# Patient Record
Sex: Male | Born: 1950 | Race: White | Hispanic: No | Marital: Single | Smoking: Current every day smoker
Health system: Southern US, Community
[De-identification: ages and names within clinical notes are randomized; demographics above are authoritative.]

## PROBLEM LIST (undated history)

## (undated) ENCOUNTER — Emergency Department (HOSPITAL_COMMUNITY): Admission: EM | Payer: Self-pay

## (undated) DIAGNOSIS — N200 Calculus of kidney: Secondary | ICD-10-CM

## (undated) DIAGNOSIS — K0510 Chronic gingivitis, plaque induced: Secondary | ICD-10-CM

## (undated) DIAGNOSIS — I4891 Unspecified atrial fibrillation: Secondary | ICD-10-CM

## (undated) DIAGNOSIS — Z7902 Long term (current) use of antithrombotics/antiplatelets: Secondary | ICD-10-CM

## (undated) DIAGNOSIS — I5022 Chronic systolic (congestive) heart failure: Secondary | ICD-10-CM

## (undated) DIAGNOSIS — Z8709 Personal history of other diseases of the respiratory system: Secondary | ICD-10-CM

## (undated) DIAGNOSIS — E119 Type 2 diabetes mellitus without complications: Secondary | ICD-10-CM

## (undated) DIAGNOSIS — I700 Atherosclerosis of aorta: Secondary | ICD-10-CM

## (undated) DIAGNOSIS — I6523 Occlusion and stenosis of bilateral carotid arteries: Secondary | ICD-10-CM

## (undated) DIAGNOSIS — F1011 Alcohol abuse, in remission: Secondary | ICD-10-CM

## (undated) DIAGNOSIS — F329 Major depressive disorder, single episode, unspecified: Secondary | ICD-10-CM

## (undated) DIAGNOSIS — C4491 Basal cell carcinoma of skin, unspecified: Secondary | ICD-10-CM

## (undated) DIAGNOSIS — K649 Unspecified hemorrhoids: Secondary | ICD-10-CM

## (undated) DIAGNOSIS — I10 Essential (primary) hypertension: Secondary | ICD-10-CM

## (undated) DIAGNOSIS — R0609 Other forms of dyspnea: Secondary | ICD-10-CM

## (undated) DIAGNOSIS — M109 Gout, unspecified: Secondary | ICD-10-CM

## (undated) DIAGNOSIS — M6281 Muscle weakness (generalized): Secondary | ICD-10-CM

## (undated) DIAGNOSIS — E559 Vitamin D deficiency, unspecified: Secondary | ICD-10-CM

## (undated) DIAGNOSIS — K567 Ileus, unspecified: Secondary | ICD-10-CM

## (undated) DIAGNOSIS — K5900 Constipation, unspecified: Secondary | ICD-10-CM

## (undated) DIAGNOSIS — L210 Seborrhea capitis: Secondary | ICD-10-CM

## (undated) DIAGNOSIS — I739 Peripheral vascular disease, unspecified: Secondary | ICD-10-CM

## (undated) DIAGNOSIS — F1490 Cocaine use, unspecified, uncomplicated: Secondary | ICD-10-CM

## (undated) DIAGNOSIS — G8190 Hemiplegia, unspecified affecting unspecified side: Secondary | ICD-10-CM

## (undated) DIAGNOSIS — I2541 Coronary artery aneurysm: Secondary | ICD-10-CM

## (undated) DIAGNOSIS — K30 Functional dyspepsia: Secondary | ICD-10-CM

## (undated) DIAGNOSIS — E785 Hyperlipidemia, unspecified: Secondary | ICD-10-CM

## (undated) DIAGNOSIS — I2510 Atherosclerotic heart disease of native coronary artery without angina pectoris: Secondary | ICD-10-CM

---

## 2020-04-05 ENCOUNTER — Inpatient Hospital Stay (HOSPITAL_COMMUNITY): Payer: 59

## 2020-04-05 ENCOUNTER — Encounter (HOSPITAL_COMMUNITY)
Admission: EM | Disposition: A | Discharge status: Skilled Nursing Facility | Payer: Self-pay | Source: Home / Self Care | Attending: Neurology

## 2020-04-05 ENCOUNTER — Emergency Department (HOSPITAL_COMMUNITY): Payer: 59

## 2020-04-05 ENCOUNTER — Encounter (HOSPITAL_COMMUNITY): Payer: Self-pay | Admitting: *Deleted

## 2020-04-05 ENCOUNTER — Emergency Department (HOSPITAL_COMMUNITY): Payer: 59 | Admitting: Anesthesiology

## 2020-04-05 ENCOUNTER — Other Ambulatory Visit: Payer: Self-pay

## 2020-04-05 ENCOUNTER — Inpatient Hospital Stay (HOSPITAL_COMMUNITY)
Admission: EM | Admit: 2020-04-05 | Discharge: 2020-04-19 | DRG: 023 | Disposition: A | Discharge status: Skilled Nursing Facility | Payer: 59 | Attending: Neurology | Admitting: Neurology

## 2020-04-05 DIAGNOSIS — F17200 Nicotine dependence, unspecified, uncomplicated: Secondary | ICD-10-CM | POA: Diagnosis present

## 2020-04-05 DIAGNOSIS — Z7289 Other problems related to lifestyle: Secondary | ICD-10-CM | POA: Diagnosis not present

## 2020-04-05 DIAGNOSIS — H53462 Homonymous bilateral field defects, left side: Secondary | ICD-10-CM | POA: Diagnosis present

## 2020-04-05 DIAGNOSIS — W19XXXA Unspecified fall, initial encounter: Secondary | ICD-10-CM | POA: Diagnosis present

## 2020-04-05 DIAGNOSIS — I69391 Dysphagia following cerebral infarction: Secondary | ICD-10-CM | POA: Diagnosis not present

## 2020-04-05 DIAGNOSIS — T7840XA Allergy, unspecified, initial encounter: Secondary | ICD-10-CM | POA: Diagnosis present

## 2020-04-05 DIAGNOSIS — R29719 NIHSS score 19: Secondary | ICD-10-CM | POA: Diagnosis present

## 2020-04-05 DIAGNOSIS — E669 Obesity, unspecified: Secondary | ICD-10-CM | POA: Diagnosis present

## 2020-04-05 DIAGNOSIS — R1312 Dysphagia, oropharyngeal phase: Secondary | ICD-10-CM | POA: Diagnosis present

## 2020-04-05 DIAGNOSIS — R4781 Slurred speech: Secondary | ICD-10-CM | POA: Diagnosis present

## 2020-04-05 DIAGNOSIS — F1410 Cocaine abuse, uncomplicated: Secondary | ICD-10-CM | POA: Diagnosis present

## 2020-04-05 DIAGNOSIS — J9811 Atelectasis: Secondary | ICD-10-CM | POA: Diagnosis not present

## 2020-04-05 DIAGNOSIS — Z20822 Contact with and (suspected) exposure to covid-19: Secondary | ICD-10-CM | POA: Diagnosis present

## 2020-04-05 DIAGNOSIS — G936 Cerebral edema: Secondary | ICD-10-CM | POA: Diagnosis not present

## 2020-04-05 DIAGNOSIS — F10239 Alcohol dependence with withdrawal, unspecified: Secondary | ICD-10-CM | POA: Diagnosis not present

## 2020-04-05 DIAGNOSIS — Z716 Tobacco abuse counseling: Secondary | ICD-10-CM

## 2020-04-05 DIAGNOSIS — F1010 Alcohol abuse, uncomplicated: Secondary | ICD-10-CM | POA: Diagnosis not present

## 2020-04-05 DIAGNOSIS — I161 Hypertensive emergency: Secondary | ICD-10-CM | POA: Diagnosis present

## 2020-04-05 DIAGNOSIS — E782 Mixed hyperlipidemia: Secondary | ICD-10-CM | POA: Diagnosis not present

## 2020-04-05 DIAGNOSIS — F17210 Nicotine dependence, cigarettes, uncomplicated: Secondary | ICD-10-CM | POA: Diagnosis present

## 2020-04-05 DIAGNOSIS — I639 Cerebral infarction, unspecified: Secondary | ICD-10-CM

## 2020-04-05 DIAGNOSIS — G8194 Hemiplegia, unspecified affecting left nondominant side: Secondary | ICD-10-CM | POA: Diagnosis present

## 2020-04-05 DIAGNOSIS — I63511 Cerebral infarction due to unspecified occlusion or stenosis of right middle cerebral artery: Secondary | ICD-10-CM | POA: Diagnosis present

## 2020-04-05 DIAGNOSIS — E1165 Type 2 diabetes mellitus with hyperglycemia: Secondary | ICD-10-CM | POA: Diagnosis present

## 2020-04-05 DIAGNOSIS — G4733 Obstructive sleep apnea (adult) (pediatric): Secondary | ICD-10-CM | POA: Diagnosis present

## 2020-04-05 DIAGNOSIS — Z6832 Body mass index (BMI) 32.0-32.9, adult: Secondary | ICD-10-CM

## 2020-04-05 DIAGNOSIS — F1020 Alcohol dependence, uncomplicated: Secondary | ICD-10-CM | POA: Diagnosis not present

## 2020-04-05 DIAGNOSIS — R471 Dysarthria and anarthria: Secondary | ICD-10-CM | POA: Diagnosis present

## 2020-04-05 DIAGNOSIS — E876 Hypokalemia: Secondary | ICD-10-CM | POA: Diagnosis present

## 2020-04-05 DIAGNOSIS — I10 Essential (primary) hypertension: Secondary | ICD-10-CM | POA: Diagnosis present

## 2020-04-05 DIAGNOSIS — E785 Hyperlipidemia, unspecified: Secondary | ICD-10-CM | POA: Diagnosis present

## 2020-04-05 DIAGNOSIS — R414 Neurologic neglect syndrome: Secondary | ICD-10-CM | POA: Diagnosis present

## 2020-04-05 DIAGNOSIS — E7800 Pure hypercholesterolemia, unspecified: Secondary | ICD-10-CM | POA: Diagnosis not present

## 2020-04-05 DIAGNOSIS — R21 Rash and other nonspecific skin eruption: Secondary | ICD-10-CM | POA: Diagnosis not present

## 2020-04-05 DIAGNOSIS — IMO0002 Reserved for concepts with insufficient information to code with codable children: Secondary | ICD-10-CM | POA: Diagnosis present

## 2020-04-05 DIAGNOSIS — Z713 Dietary counseling and surveillance: Secondary | ICD-10-CM

## 2020-04-05 DIAGNOSIS — I63512 Cerebral infarction due to unspecified occlusion or stenosis of left middle cerebral artery: Secondary | ICD-10-CM | POA: Diagnosis not present

## 2020-04-05 DIAGNOSIS — R1310 Dysphagia, unspecified: Secondary | ICD-10-CM

## 2020-04-05 DIAGNOSIS — R29810 Facial weakness: Secondary | ICD-10-CM | POA: Diagnosis present

## 2020-04-05 DIAGNOSIS — Z8249 Family history of ischemic heart disease and other diseases of the circulatory system: Secondary | ICD-10-CM

## 2020-04-05 DIAGNOSIS — Z4659 Encounter for fitting and adjustment of other gastrointestinal appliance and device: Secondary | ICD-10-CM

## 2020-04-05 DIAGNOSIS — N179 Acute kidney failure, unspecified: Secondary | ICD-10-CM | POA: Diagnosis present

## 2020-04-05 DIAGNOSIS — E119 Type 2 diabetes mellitus without complications: Secondary | ICD-10-CM | POA: Diagnosis present

## 2020-04-05 HISTORY — DX: Cerebral infarction due to unspecified occlusion or stenosis of right middle cerebral artery: I63.511

## 2020-04-05 HISTORY — PX: IR PERCUTANEOUS ART THROMBECTOMY/INFUSION INTRACRANIAL INC DIAG ANGIO: IMG6087

## 2020-04-05 HISTORY — PX: IR INTRA CRAN STENT: IMG2345

## 2020-04-05 HISTORY — DX: Hemiplegia, unspecified affecting left nondominant side: G81.94

## 2020-04-05 HISTORY — PX: IR CT HEAD LTD: IMG2386

## 2020-04-05 HISTORY — PX: RADIOLOGY WITH ANESTHESIA: SHX6223

## 2020-04-05 LAB — MRSA PCR SCREENING: MRSA by PCR: NEGATIVE

## 2020-04-05 LAB — DIFFERENTIAL
Abs Immature Granulocytes: 0.08 10*3/uL — ABNORMAL HIGH (ref 0.00–0.07)
Basophils Absolute: 0.1 10*3/uL (ref 0.0–0.1)
Basophils Relative: 1 %
Eosinophils Absolute: 0.3 10*3/uL (ref 0.0–0.5)
Eosinophils Relative: 3 %
Immature Granulocytes: 1 %
Lymphocytes Relative: 31 %
Lymphs Abs: 2.8 10*3/uL (ref 0.7–4.0)
Monocytes Absolute: 0.6 10*3/uL (ref 0.1–1.0)
Monocytes Relative: 7 %
Neutro Abs: 5.2 10*3/uL (ref 1.7–7.7)
Neutrophils Relative %: 57 %

## 2020-04-05 LAB — SARS CORONAVIRUS 2 BY RT PCR (HOSPITAL ORDER, PERFORMED IN ~~LOC~~ HOSPITAL LAB): SARS Coronavirus 2: NEGATIVE

## 2020-04-05 LAB — CBC
HCT: 46.7 % (ref 39.0–52.0)
Hemoglobin: 16 g/dL (ref 13.0–17.0)
MCH: 29 pg (ref 26.0–34.0)
MCHC: 34.3 g/dL (ref 30.0–36.0)
MCV: 84.8 fL (ref 80.0–100.0)
Platelets: 205 10*3/uL (ref 150–400)
RBC: 5.51 MIL/uL (ref 4.22–5.81)
RDW: 13.4 % (ref 11.5–15.5)
WBC: 9 10*3/uL (ref 4.0–10.5)
nRBC: 0 % (ref 0.0–0.2)

## 2020-04-05 LAB — I-STAT CHEM 8, ED
BUN: 15 mg/dL (ref 8–23)
Calcium, Ion: 1.18 mmol/L (ref 1.15–1.40)
Chloride: 99 mmol/L (ref 98–111)
Creatinine, Ser: 1.6 mg/dL — ABNORMAL HIGH (ref 0.61–1.24)
Glucose, Bld: 356 mg/dL — ABNORMAL HIGH (ref 70–99)
HCT: 48 % (ref 39.0–52.0)
Hemoglobin: 16.3 g/dL (ref 13.0–17.0)
Potassium: 3.3 mmol/L — ABNORMAL LOW (ref 3.5–5.1)
Sodium: 137 mmol/L (ref 135–145)
TCO2: 22 mmol/L (ref 22–32)

## 2020-04-05 LAB — URINALYSIS, ROUTINE W REFLEX MICROSCOPIC
Bacteria, UA: NONE SEEN
Bilirubin Urine: NEGATIVE
Glucose, UA: >=500 mg/dL — AB
Ketones, ur: NEGATIVE mg/dL
Leukocytes,Ua: NEGATIVE
Nitrite: NEGATIVE
Protein, ur: 100 mg/dL — AB
Specific Gravity, Urine: 1.041 — ABNORMAL HIGH (ref 1.005–1.030)
pH: 5 (ref 5.0–8.0)

## 2020-04-05 LAB — COMPREHENSIVE METABOLIC PANEL
ALT: 38 U/L (ref 0–44)
AST: 39 U/L (ref 15–41)
Albumin: 3.6 g/dL (ref 3.5–5.0)
Alkaline Phosphatase: 80 U/L (ref 38–126)
Anion gap: 16 — ABNORMAL HIGH (ref 5–15)
BUN: 13 mg/dL (ref 8–23)
CO2: 21 mmol/L — ABNORMAL LOW (ref 22–32)
Calcium: 9.5 mg/dL (ref 8.9–10.3)
Chloride: 99 mmol/L (ref 98–111)
Creatinine, Ser: 1.69 mg/dL — ABNORMAL HIGH (ref 0.61–1.24)
GFR calc Af Amer: 48 mL/min — ABNORMAL LOW (ref >60)
GFR calc non Af Amer: 41 mL/min — ABNORMAL LOW (ref >60)
Glucose, Bld: 351 mg/dL — ABNORMAL HIGH (ref 70–99)
Potassium: 3.4 mmol/L — ABNORMAL LOW (ref 3.5–5.1)
Sodium: 136 mmol/L (ref 135–145)
Total Bilirubin: 0.8 mg/dL (ref 0.3–1.2)
Total Protein: 7 g/dL (ref 6.5–8.1)

## 2020-04-05 LAB — GLUCOSE, CAPILLARY
Glucose-Capillary: 323 mg/dL — ABNORMAL HIGH (ref 70–99)
Glucose-Capillary: 285 mg/dL — ABNORMAL HIGH (ref 70–99)
Glucose-Capillary: 233 mg/dL — ABNORMAL HIGH (ref 70–99)

## 2020-04-05 LAB — CBG MONITORING, ED: Glucose-Capillary: 309 mg/dL — ABNORMAL HIGH (ref 70–99)

## 2020-04-05 LAB — RAPID URINE DRUG SCREEN, HOSP PERFORMED
Amphetamines: NOT DETECTED
Barbiturates: NOT DETECTED
Benzodiazepines: NOT DETECTED
Cocaine: POSITIVE — AB
Opiates: NOT DETECTED
Tetrahydrocannabinol: NOT DETECTED

## 2020-04-05 LAB — PROTIME-INR
INR: 1 (ref 0.8–1.2)
Prothrombin Time: 13 seconds (ref 11.4–15.2)

## 2020-04-05 LAB — APTT: aPTT: 29 seconds (ref 24–36)

## 2020-04-05 LAB — ETHANOL: Alcohol, Ethyl (B): <10 mg/dL (ref <10)

## 2020-04-05 SURGERY — IR WITH ANESTHESIA
Anesthesia: General

## 2020-04-05 MED ORDER — IOHEXOL 350 MG/ML SOLN
50.0000 mL | Freq: Once | INTRAVENOUS | Status: AC | PRN
  Administered 2020-04-05: 50 mL via INTRAVENOUS

## 2020-04-05 MED ORDER — VERAPAMIL HCL 2.5 MG/ML IV SOLN
INTRAVENOUS | Status: AC
  Filled 2020-04-05: qty 2

## 2020-04-05 MED ORDER — CANGRELOR BOLUS VIA INFUSION
INTRAVENOUS | Status: DC | PRN
  Administered 2020-04-05: 1491 ug via INTRAVENOUS

## 2020-04-05 MED ORDER — SUGAMMADEX SODIUM 200 MG/2ML IV SOLN
INTRAVENOUS | Status: DC | PRN
  Administered 2020-04-05 (×2): 200 mg via INTRAVENOUS

## 2020-04-05 MED ORDER — CLEVIDIPINE BUTYRATE 0.5 MG/ML IV EMUL
INTRAVENOUS | Status: AC
  Filled 2020-04-05: qty 50

## 2020-04-05 MED ORDER — ACETAMINOPHEN 650 MG RE SUPP: 650.0000 mg | RECTAL | Status: DC | PRN

## 2020-04-05 MED ORDER — SODIUM CHLORIDE 0.9 % IV SOLN: INTRAVENOUS | Status: DC

## 2020-04-05 MED ORDER — LABETALOL HCL 5 MG/ML IV SOLN
20.0000 mg | Freq: Once | INTRAVENOUS | Status: AC
  Administered 2020-04-05: 20 mg via INTRAVENOUS

## 2020-04-05 MED ORDER — VERAPAMIL HCL 2.5 MG/ML IV SOLN
INTRAVENOUS | Status: DC | PRN
  Administered 2020-04-05: 10 mg via INTRA_ARTERIAL

## 2020-04-05 MED ORDER — CLOPIDOGREL BISULFATE 300 MG PO TABS
ORAL_TABLET | ORAL | Status: AC
  Filled 2020-04-05: qty 1

## 2020-04-05 MED ORDER — PANTOPRAZOLE SODIUM 40 MG IV SOLR
40.0000 mg | Freq: Every day | INTRAVENOUS | Status: DC
  Administered 2020-04-05: 40 mg via INTRAVENOUS
  Filled 2020-04-05: qty 40

## 2020-04-05 MED ORDER — ORAL CARE MOUTH RINSE
15.0000 mL | Freq: Two times a day (BID) | OROMUCOSAL | Status: DC
  Administered 2020-04-06 – 2020-04-19 (×25): 15 mL via OROMUCOSAL

## 2020-04-05 MED ORDER — CHLORHEXIDINE GLUCONATE CLOTH 2 % EX PADS
6.0000 | MEDICATED_PAD | Freq: Every day | CUTANEOUS | Status: DC
  Administered 2020-04-07 – 2020-04-13 (×6): 6 via TOPICAL

## 2020-04-05 MED ORDER — PROPOFOL 10 MG/ML IV BOLUS
INTRAVENOUS | Status: DC | PRN
  Administered 2020-04-05: 140 mg via INTRAVENOUS

## 2020-04-05 MED ORDER — CHLORHEXIDINE GLUCONATE 0.12 % MT SOLN
15.0000 mL | Freq: Two times a day (BID) | OROMUCOSAL | Status: DC
  Administered 2020-04-05 – 2020-04-19 (×27): 15 mL via OROMUCOSAL
  Filled 2020-04-05 (×23): qty 15

## 2020-04-05 MED ORDER — FENTANYL CITRATE (PF) 100 MCG/2ML IJ SOLN
INTRAMUSCULAR | Status: AC
  Filled 2020-04-05: qty 2

## 2020-04-05 MED ORDER — ASPIRIN 81 MG PO CHEW
CHEWABLE_TABLET | ORAL | Status: AC
  Filled 2020-04-05: qty 1

## 2020-04-05 MED ORDER — SODIUM CHLORIDE 0.9 % IV SOLN: 50.0000 mL | Freq: Once | INTRAVENOUS | Status: AC

## 2020-04-05 MED ORDER — ONDANSETRON HCL 4 MG/2ML IJ SOLN
INTRAMUSCULAR | Status: DC | PRN
  Administered 2020-04-05: 4 mg via INTRAVENOUS

## 2020-04-05 MED ORDER — ACETAMINOPHEN 325 MG PO TABS
650.0000 mg | ORAL_TABLET | ORAL | Status: DC | PRN
  Administered 2020-04-14 – 2020-04-18 (×5): 650 mg via ORAL
  Filled 2020-04-05 (×5): qty 2

## 2020-04-05 MED ORDER — IOHEXOL 240 MG/ML SOLN
INTRAMUSCULAR | Status: AC
  Filled 2020-04-05: qty 200

## 2020-04-05 MED ORDER — TICAGRELOR 90 MG PO TABS
ORAL_TABLET | ORAL | Status: AC
  Filled 2020-04-05: qty 2

## 2020-04-05 MED ORDER — SODIUM CHLORIDE 0.9 % IV SOLN
INTRAVENOUS | Status: DC | PRN
  Administered 2020-04-05: 2 ug/kg/min via INTRAVENOUS

## 2020-04-05 MED ORDER — PHENYLEPHRINE 40 MCG/ML (10ML) SYRINGE FOR IV PUSH (FOR BLOOD PRESSURE SUPPORT)
PREFILLED_SYRINGE | INTRAVENOUS | Status: DC | PRN
  Administered 2020-04-05: 40 ug via INTRAVENOUS
  Administered 2020-04-05: 80 ug via INTRAVENOUS

## 2020-04-05 MED ORDER — FENTANYL CITRATE (PF) 100 MCG/2ML IJ SOLN
INTRAMUSCULAR | Status: DC | PRN
  Administered 2020-04-05 (×2): 50 ug via INTRAVENOUS

## 2020-04-05 MED ORDER — TICAGRELOR 90 MG PO TABS
180.0000 mg | ORAL_TABLET | Freq: Once | ORAL | Status: DC
  Filled 2020-04-05: qty 2

## 2020-04-05 MED ORDER — LIDOCAINE 2% (20 MG/ML) 5 ML SYRINGE
INTRAMUSCULAR | Status: DC | PRN
  Administered 2020-04-05: 50 mg via INTRAVENOUS

## 2020-04-05 MED ORDER — IOHEXOL 300 MG/ML  SOLN
50.0000 mL | Freq: Once | INTRAMUSCULAR | Status: AC | PRN
  Administered 2020-04-05: 30 mL

## 2020-04-05 MED ORDER — ASPIRIN 81 MG PO CHEW
81.0000 mg | CHEWABLE_TABLET | Freq: Once | ORAL | Status: DC
  Filled 2020-04-05: qty 1

## 2020-04-05 MED ORDER — SENNOSIDES-DOCUSATE SODIUM 8.6-50 MG PO TABS: 1.0000 | ORAL_TABLET | Freq: Every evening | ORAL | Status: DC | PRN

## 2020-04-05 MED ORDER — INSULIN ASPART 100 UNIT/ML ~~LOC~~ SOLN
0.0000 [IU] | SUBCUTANEOUS | Status: DC
  Administered 2020-04-05: 5 [IU] via SUBCUTANEOUS
  Administered 2020-04-05: 8 [IU] via SUBCUTANEOUS
  Administered 2020-04-06: 5 [IU] via SUBCUTANEOUS
  Administered 2020-04-06: 2 [IU] via SUBCUTANEOUS
  Administered 2020-04-06 (×2): 3 [IU] via SUBCUTANEOUS
  Administered 2020-04-06: 5 [IU] via SUBCUTANEOUS
  Administered 2020-04-06: 2 [IU] via SUBCUTANEOUS
  Administered 2020-04-07: 3 [IU] via SUBCUTANEOUS
  Administered 2020-04-07 (×2): 2 [IU] via SUBCUTANEOUS
  Administered 2020-04-07: 3 [IU] via SUBCUTANEOUS
  Administered 2020-04-07: 2 [IU] via SUBCUTANEOUS
  Administered 2020-04-07: 3 [IU] via SUBCUTANEOUS
  Administered 2020-04-08 (×4): 5 [IU] via SUBCUTANEOUS
  Administered 2020-04-08: 8 [IU] via SUBCUTANEOUS
  Administered 2020-04-09: 3 [IU] via SUBCUTANEOUS
  Administered 2020-04-09: 8 [IU] via SUBCUTANEOUS
  Administered 2020-04-09: 2 [IU] via SUBCUTANEOUS
  Administered 2020-04-09: 3 [IU] via SUBCUTANEOUS
  Administered 2020-04-09: 5 [IU] via SUBCUTANEOUS
  Administered 2020-04-09 – 2020-04-10 (×2): 3 [IU] via SUBCUTANEOUS
  Administered 2020-04-10 – 2020-04-11 (×8): 5 [IU] via SUBCUTANEOUS
  Administered 2020-04-11: 3 [IU] via SUBCUTANEOUS
  Administered 2020-04-11: 5 [IU] via SUBCUTANEOUS
  Administered 2020-04-11: 3 [IU] via SUBCUTANEOUS
  Administered 2020-04-12 (×3): 5 [IU] via SUBCUTANEOUS
  Administered 2020-04-12 (×2): 3 [IU] via SUBCUTANEOUS
  Administered 2020-04-12 – 2020-04-13 (×3): 5 [IU] via SUBCUTANEOUS
  Administered 2020-04-13 (×4): 3 [IU] via SUBCUTANEOUS
  Administered 2020-04-13: 8 [IU] via SUBCUTANEOUS
  Administered 2020-04-14: 2 [IU] via SUBCUTANEOUS
  Administered 2020-04-14 (×2): 5 [IU] via SUBCUTANEOUS
  Administered 2020-04-14: 3 [IU] via SUBCUTANEOUS
  Administered 2020-04-14: 2 [IU] via SUBCUTANEOUS
  Administered 2020-04-15: 5 [IU] via SUBCUTANEOUS
  Administered 2020-04-15: 2 [IU] via SUBCUTANEOUS
  Administered 2020-04-15 (×3): 3 [IU] via SUBCUTANEOUS
  Administered 2020-04-15: 2 [IU] via SUBCUTANEOUS
  Administered 2020-04-16: 3 [IU] via SUBCUTANEOUS
  Administered 2020-04-16: 2 [IU] via SUBCUTANEOUS
  Administered 2020-04-16: 5 [IU] via SUBCUTANEOUS
  Administered 2020-04-16: 2 [IU] via SUBCUTANEOUS
  Administered 2020-04-17: 3 [IU] via SUBCUTANEOUS
  Administered 2020-04-17: 2 [IU] via SUBCUTANEOUS
  Administered 2020-04-17: 5 [IU] via SUBCUTANEOUS
  Administered 2020-04-17 – 2020-04-18 (×3): 3 [IU] via SUBCUTANEOUS

## 2020-04-05 MED ORDER — ROCURONIUM BROMIDE 10 MG/ML (PF) SYRINGE
PREFILLED_SYRINGE | INTRAVENOUS | Status: DC | PRN
  Administered 2020-04-05: 20 mg via INTRAVENOUS
  Administered 2020-04-05: 30 mg via INTRAVENOUS
  Administered 2020-04-05: 50 mg via INTRAVENOUS
  Administered 2020-04-05 (×2): 20 mg via INTRAVENOUS
  Administered 2020-04-05: 30 mg via INTRAVENOUS

## 2020-04-05 MED ORDER — EPTIFIBATIDE 20 MG/10ML IV SOLN
INTRAVENOUS | Status: AC
  Filled 2020-04-05: qty 10

## 2020-04-05 MED ORDER — LABETALOL HCL 5 MG/ML IV SOLN
INTRAVENOUS | Status: AC
  Filled 2020-04-05: qty 4

## 2020-04-05 MED ORDER — CLEVIDIPINE BUTYRATE 0.5 MG/ML IV EMUL
0.0000 mg/h | INTRAVENOUS | Status: DC
  Administered 2020-04-05: 15 mg/h via INTRAVENOUS
  Administered 2020-04-05: 14 mg/h via INTRAVENOUS
  Administered 2020-04-05: 17 mg/h via INTRAVENOUS
  Administered 2020-04-06 (×2): 30 mg/h via INTRAVENOUS
  Administered 2020-04-06: 21 mg/h via INTRAVENOUS
  Administered 2020-04-06: 27 mg/h via INTRAVENOUS
  Administered 2020-04-06: 28 mg/h via INTRAVENOUS
  Administered 2020-04-06: 30 mg/h via INTRAVENOUS
  Administered 2020-04-06: 20 mg/h via INTRAVENOUS
  Administered 2020-04-06: 10 mg/h via INTRAVENOUS
  Filled 2020-04-05 (×11): qty 50

## 2020-04-05 MED ORDER — CANGRELOR TETRASODIUM 50 MG IV SOLR
INTRAVENOUS | Status: AC
  Filled 2020-04-05: qty 50

## 2020-04-05 MED ORDER — ALTEPLASE (STROKE) FULL DOSE INFUSION
0.9000 mg/kg | Freq: Once | INTRAVENOUS | Status: AC
  Administered 2020-04-05: 89.5 mg via INTRAVENOUS
  Filled 2020-04-05: qty 100

## 2020-04-05 MED ORDER — TICAGRELOR 90 MG PO TABS
180.0000 mg | ORAL_TABLET | Freq: Once | ORAL | Status: AC
  Administered 2020-04-05: 180 mg

## 2020-04-05 MED ORDER — ASPIRIN 81 MG PO CHEW
81.0000 mg | CHEWABLE_TABLET | Freq: Once | ORAL | Status: AC
  Administered 2020-04-05: 81 mg

## 2020-04-05 MED ORDER — NITROGLYCERIN 1 MG/10 ML FOR IR/CATH LAB
INTRA_ARTERIAL | Status: AC
  Filled 2020-04-05: qty 10

## 2020-04-05 MED ORDER — INSULIN ASPART 100 UNIT/ML ~~LOC~~ SOLN
0.0000 [IU] | Freq: Three times a day (TID) | SUBCUTANEOUS | Status: DC
  Administered 2020-04-05: 11 [IU] via SUBCUTANEOUS

## 2020-04-05 MED ORDER — TIROFIBAN HCL IN NACL 5-0.9 MG/100ML-% IV SOLN
INTRAVENOUS | Status: AC
  Filled 2020-04-05: qty 100

## 2020-04-05 MED ORDER — ONDANSETRON HCL 4 MG/2ML IJ SOLN: 4.0000 mg | Freq: Four times a day (QID) | INTRAMUSCULAR | Status: DC | PRN

## 2020-04-05 MED ORDER — IOHEXOL 240 MG/ML SOLN
150.0000 mL | Freq: Once | INTRAMUSCULAR | Status: AC | PRN
  Administered 2020-04-05: 60 mL via INTRA_ARTERIAL

## 2020-04-05 MED ORDER — CLEVIDIPINE BUTYRATE 0.5 MG/ML IV EMUL
0.0000 mg/h | INTRAVENOUS | Status: DC
  Administered 2020-04-05: 2 mg/h via INTRAVENOUS
  Administered 2020-04-05: 21 mg/h via INTRAVENOUS
  Filled 2020-04-05: qty 50

## 2020-04-05 MED ORDER — STROKE: EARLY STAGES OF RECOVERY BOOK
Freq: Once | Status: DC
  Filled 2020-04-05: qty 1

## 2020-04-05 MED ORDER — ACETAMINOPHEN 160 MG/5ML PO SOLN
650.0000 mg | ORAL | Status: DC | PRN
  Administered 2020-04-06 – 2020-04-14 (×9): 650 mg
  Filled 2020-04-05 (×9): qty 20.3

## 2020-04-05 MED ORDER — LACTATED RINGERS IV SOLN: INTRAVENOUS | Status: DC | PRN

## 2020-04-05 MED ORDER — SUCCINYLCHOLINE CHLORIDE 20 MG/ML IJ SOLN
INTRAMUSCULAR | Status: DC | PRN
  Administered 2020-04-05: 1160 mg via INTRAVENOUS

## 2020-04-05 NOTE — ED Provider Notes (Signed)
[facility] EMERGENCY DEPARTMENT Provider Note   CSN: [id number] Arrival date & time: 04/05/20  1044     History No chief complaint on file.   Rodney Estrada is a 69 y.o. male.  Level 5 caveat secondary to acuity of condition.  History primarily by EMS.  Patient last known well 10 AM was witnessed to have fallen.  EMS found with left-sided neglect and left facial droop no use of left arm or leg.  Blood pressure quite elevated.  Patient with difficult to understand speech, limited history.  No other history available at this time.  The history is provided by the patient and the EMS personnel.  Cerebrovascular Accident This is a new problem. The current episode started less than 1 hour ago. The problem has been gradually worsening. Nothing aggravates the symptoms. Nothing relieves the symptoms. He has tried nothing for the symptoms. The treatment provided no relief.       No past medical history on file.  There are no problems to display for this patient.   Past Surgical History:  Procedure Laterality Date  . IR PERCUTANEOUS ART THROMBECTOMY/INFUSION INTRACRANIAL INC DIAG ANGIO  04/05/2020           No family history on file.  Social History   Tobacco Use  . Smoking status: Current Every Day Smoker  . Smokeless tobacco: Never Used  Substance Use Topics  . Alcohol use: Yes    Alcohol/week: 10.0 standard drinks    Types: 10 Cans of beer per week    Comment: every DAy  . Drug use: Not Currently    Home Medications Prior to Admission medications   Not on File    Allergies    Patient has no known allergies.  Review of Systems   Review of Systems  Unable to perform ROS: Acuity of condition    Physical Exam Updated Vital Signs BP (!) 141/70   Pulse 83   Temp (!) 97.5 F (36.4 C) (Oral)   Resp 13   Ht '5\' 9"'  (1.753 m)   Wt 99.4 kg   SpO2 95%   BMI 32.36 kg/m   Physical Exam Vitals and nursing note reviewed.  Constitutional:       Appearance: He is well-developed.  HENT:     Head: Normocephalic.     Comments: Abrasions of left face Eyes:     Conjunctiva/sclera: Conjunctivae normal.  Cardiovascular:     Rate and Rhythm: Normal rate and regular rhythm.     Heart sounds: No murmur heard.   Pulmonary:     Effort: Pulmonary effort is normal. No respiratory distress.     Breath sounds: Normal breath sounds.  Abdominal:     Palpations: Abdomen is soft.     Tenderness: There is no abdominal tenderness. There is no guarding or rebound.  Musculoskeletal:        General: No deformity.     Cervical back: Neck supple.  Skin:    General: Skin is warm and dry.     Capillary Refill: Capillary refill takes less than 2 seconds.  Neurological:     Mental Status: He is alert.     Comments: Patient is awake and alert.  Right gaze preference.  Normal strength right arm right leg, no use left arm left leg.  No withdraw to pain.  Left facial droop.  Slurred speech.     ED Results / Procedures / Treatments   Labs (all labs ordered are listed, but only  abnormal results are displayed) Labs Reviewed  DIFFERENTIAL - Abnormal; Notable for the following components:      Result Value   Abs Immature Granulocytes 0.08 (*)    All other components within normal limits  COMPREHENSIVE METABOLIC PANEL - Abnormal; Notable for the following components:   Potassium 3.4 (*)    CO2 21 (*)    Glucose, Bld 351 (*)    Creatinine, Ser 1.69 (*)    GFR calc non Af Amer 41 (*)    GFR calc Af Amer 48 (*)    Anion gap 16 (*)    All other components within normal limits  GLUCOSE, CAPILLARY - Abnormal; Notable for the following components:   Glucose-Capillary 323 (*)    All other components within normal limits  CBG MONITORING, ED - Abnormal; Notable for the following components:   Glucose-Capillary 309 (*)    All other components within normal limits  I-STAT CHEM 8, ED - Abnormal; Notable for the following components:   Potassium 3.3 (*)     Creatinine, Ser 1.60 (*)    Glucose, Bld 356 (*)    All other components within normal limits  SARS CORONAVIRUS 2 BY RT PCR (HOSPITAL ORDER, [facility])  MRSA PCR SCREENING  ETHANOL  PROTIME-INR  APTT  CBC  RAPID URINE DRUG SCREEN, HOSP PERFORMED  URINALYSIS, ROUTINE W REFLEX MICROSCOPIC  HIV ANTIBODY (ROUTINE TESTING W REFLEX)  HEMOGLOBIN A1C  LIPID PANEL    EKG None  Radiology CT HEAD WO CONTRAST  Result Date: 04/05/2020 CLINICAL DATA:  Stroke. Thrombectomy and stenting right MCA today. Patient received tPA. EXAM: CT HEAD WITHOUT CONTRAST TECHNIQUE: Contiguous axial images were obtained from the base of the skull through the vertex without intravenous contrast. COMPARISON:  CT head earlier today FINDINGS: Brain: Interval development of low-density in the right MCA territory compatible with acute infarct. This involves the insula, lateral temporal lobe, right frontal lobe and right basal ganglia. There is mild hyperdensity in the right putamen which may represent contrast staining or slight hemorrhage. Otherwise no acute hemorrhage. Generalized atrophy without hydrocephalus. 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 Vascular: Interval stenting right MCA . No hyperdense vessel. Contrast enhanced vessels due to earlier angiography. Skull: Negative Sinuses/Orbits: Mild mucosal edema paranasal sinuses. No orbital lesion. Other: None IMPRESSION: Right MCA thrombectomy and stenting earlier today. Developing right MCA infarct. Mild hyperdensity in the right putamen which may represent contrast staining from acute infarct versus mild hemorrhage. These results were called by telephone at the time of interpretation on 04/05/2020 at 5:14 pm to provider Zap , who verbally acknowledged these results. Electronically Signed   By: Franchot Gallo M.D.   On: 04/05/2020 17:15   CT C-SPINE NO CHARGE  Result Date: 04/05/2020 CLINICAL DATA:   Stroke. EXAM: CT CERVICAL SPINE WITHOUT CONTRAST TECHNIQUE: Multidetector CT imaging of the cervical spine was performed without intravenous contrast. Multiplanar CT image reconstructions were also generated. COMPARISON:  Open FINDINGS: Alignment: Cervical dextrocurvature. Straightening of the expected cervical lordosis. No significant spondylolisthesis. Skull base and vertebrae: The basion-dental and atlanto-dental intervals are maintained.No evidence of acute fracture to the cervical spine. Well-corticated fragment adjacent to the T1 spinous process, likely chronic Soft tissues and spinal canal: No prevertebral fluid or swelling. No visible canal hematoma. Nonspecific 11 mm cystic appearing subcutaneous lesion within the mid right neck (series 18, image 66). Disc levels: Cervical spondylosis. Most notably at C5-C6 and C6-C7, there is advanced  disc space narrowing with posterior disc osteophytes as well as uncovertebral and facet hypertrophy. Bilateral neural foraminal narrowing with suspected at least moderate spinal canal stenosis at C5-C6. Bilateral neural foraminal narrowing with suspected moderate/severe spinal canal stenosis at C6-C7 Upper chest: No consolidation within the imaged lung apices. No visible pneumothorax. IMPRESSION: No evidence of acute fracture to the cervical spine. Cervical spondylosis as described and greatest at C5-C6 and C6-C7. Bilateral neural foraminal narrowing with suspected moderate/severe spinal canal stenosis at C6-C7. Bilateral neural foraminal narrowing with suspected at least moderate spinal canal stenosis at C5-C6 Cervical dextrocurvature. Nonspecific cystic appearing 11 mm subcutaneous lesion within the mid right neck. Electronically Signed   By: Kellie Simmering DO   On: 04/05/2020 11:59   CT HEAD CODE STROKE WO CONTRAST  Addendum Date: 04/05/2020   ADDENDUM REPORT: 04/05/2020 11:14 ADDENDUM: These results were called by telephone at the time of interpretation on 04/05/2020 at  11:14 am to provider Dr. Rory Percy, who verbally acknowledged these results. Electronically Signed   By: Kellie Simmering DO   On: 04/05/2020 11:14   Result Date: 04/05/2020 CLINICAL DATA:  Code stroke. Neuro deficit, acute, stroke suspected. EXAM: CT HEAD WITHOUT CONTRAST TECHNIQUE: Contiguous axial images were obtained from the base of the skull through the vertex without intravenous contrast. COMPARISON:  No pertinent prior studies available for comparison. FINDINGS: Brain: The examination is mildly motion degraded. Mild generalized parenchymal atrophy. There is no acute intracranial hemorrhage. No acute demarcated cortical infarct is identified. There are numerous small lacunar infarcts within the bilateral cerebral white matter, right thalamus and bilateral basal ganglia which appear chronic. Background mild patchy hypoattenuation within the cerebral white matter which is nonspecific, but consistent with chronic small vessel ischemic disease. No extra-axial fluid collection. No evidence of intracranial mass. No midline shift. Vascular: No hyperdense vessel.  Atherosclerotic calcifications Skull: Normal. Negative for fracture or focal lesion. Sinuses/Orbits: Visualized orbits show no acute finding. Small right maxillary sinus mucous retention cyst. Mild ethmoid sinus mucosal thickening. No significant mastoid effusion. ASPECTS [facility] Stroke Program Early CT Score) - Ganglionic level infarction (caudate, lentiform nuclei, internal capsule, insula, M1-M3 cortex): 7 - Supraganglionic infarction (M4-M6 cortex): 3 Total score (0-10 with 10 being normal): 10 IMPRESSION: 1. Mildly motion degraded examination. 2. No CT evidence of acute infarct or acute intracranial hemorrhage. 3. Numerous small chronic appearing lacunar infarcts within the cerebral white matter, bilateral basal ganglia and right thalamus. Background mild chronic small vessel ischemic changes within the cerebral white matter. 4. Mild generalized parenchymal  atrophy. 5. Mild ethmoid sinus mucosal thickening. Small right maxillary sinus mucous retention cyst. Electronically Signed: By: Kellie Simmering DO On: 04/05/2020 11:11   CT ANGIO HEAD CODE STROKE  Result Date: 04/05/2020 CLINICAL DATA:  Stroke, follow-up. EXAM: CT ANGIOGRAPHY HEAD AND NECK TECHNIQUE: Multidetector CT imaging of the head and neck was performed using the standard protocol during bolus administration of intravenous contrast. Multiplanar CT image reconstructions and MIPs were obtained to evaluate the vascular anatomy. Carotid stenosis measurements (when applicable) are obtained utilizing NASCET criteria, using the distal internal carotid diameter as the denominator. CONTRAST:  Administered contrast not known at this time. COMPARISON:  Concurrently performed noncontrast head CT. FINDINGS: CTA NECK FINDINGS Aortic arch: Standard aortic branching. Atherosclerotic plaque within the visualized aortic arch and proximal major branch vessels of the neck. No hemodynamically significant innominate or proximal subclavian artery stenosis. Right carotid system: CCA and ICA patent within the neck without significant stenosis (50% or greater). Moderate mixed  plaque within the carotid bifurcation and proximal ICA. Mild to moderate mixed plaque is also present within the distal cervical ICA Left carotid system: CCA and ICA patent within the neck. Moderate mixed plaque within the carotid bifurcation and proximal ICA. Narrowing of the proximal ICA of 40-50% Vertebral arteries: The dominant right vertebral artery is patent within the neck without significant stenosis (50% or greater). The non dominant left vertebral artery is patent within the neck. Moderate atherosclerotic narrowing at the origin of this vessel Skeleton: No acute bony abnormality or aggressive osseous lesion. Cervical spondylosis with multilevel disc space narrowing, posterior disc osteophytes, uncovertebral and facet hypertrophy. Spondylosis is  greatest at C5-C6 and C6-C7 Other neck: Thyroid unremarkable. Nonspecific subcutaneous cystic appearing lesion within the right mid neck measuring 10 mm (series 100, image 411). Upper chest: No consolidation within the imaged lung apices Review of the MIP images confirms the above findings CTA HEAD FINDINGS Anterior circulation: The intracranial internal carotid arteries are patent. Atherosclerotic plaque within both vessels. Mild to moderate narrowing of the pre cavernous right ICA. 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 Some reconstitution of flow is seen within M2 and more distal right MCA branch vessels, although asymmetrically decreased as compared to the left. The M1 left middle cerebral artery is patent. No left M2 proximal branch occlusion or high-grade proximal stenosis is identified. The anterior cerebral arteries are patent. No intracranial aneurysm is identified. Posterior circulation: The intracranial vertebral arteries are patent without significant stenosis. Mild nonstenotic plaque within the V4 right vertebral artery. The basilar artery is patent without significant stenosis. The posterior cerebral arteries are patent. There is a sizable right posterior communicating artery. The left posterior communicating artery is hypoplastic or absent. Moderate stenosis within the proximal P2 right PCA. Moderate stenosis within the mid P2 left PCA. Venous sinuses: Within limitations of contrast timing, no convincing thrombus. 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 These results were called by telephone at the time of interpretation on 04/05/2020 at 11:14 am to provider Dr. Rory Percy, who verbally acknowledged these results. IMPRESSION: CTA neck: 1. The left common and internal carotid  arteries are patent within the neck. 40-50% atherosclerotic narrowing of the proximal left ICA. 2. The right common and internal carotid arteries are patent within the neck without significant stenosis. Moderate mixed plaque within the right carotid bifurcation and proximal ICA. Mild to moderate mixed plaque within the distal cervical ICA. 3. The vertebral arteries are patent within the neck bilaterally with the right being dominant. Moderate stenosis at the origin of the non dominant left vertebral artery. CTA head: 1. 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 There is some reconstitution of flow within M2 and more distal right MCA branch vessels, although asymmetrically decreased as compared to the left. 2. Sites of up to moderate stenosis within both intracranial internal carotid arteries. 3. Moderate stenoses within the P2 posterior cerebral arteries bilaterally. Electronically Signed   By: Kellie Simmering DO   On: 04/05/2020 11:44   CT ANGIO NECK CODE STROKE  Result Date: 04/05/2020 CLINICAL DATA:  Stroke, follow-up. EXAM: CT ANGIOGRAPHY HEAD AND NECK TECHNIQUE: Multidetector CT imaging of the head and neck was performed using the standard protocol during bolus administration of intravenous contrast. Multiplanar CT image reconstructions and MIPs were obtained  to evaluate the vascular anatomy. Carotid stenosis measurements (when applicable) are obtained utilizing NASCET criteria, using the distal internal carotid diameter as the denominator. CONTRAST:  Administered contrast not known at this time. COMPARISON:  Concurrently performed noncontrast head CT. FINDINGS: CTA NECK FINDINGS Aortic arch: Standard aortic branching. Atherosclerotic plaque within the visualized aortic arch and proximal major branch vessels of the neck. No hemodynamically significant innominate or proximal subclavian artery stenosis. Right carotid system: CCA and ICA patent within the neck without significant stenosis  (50% or greater). Moderate mixed plaque within the carotid bifurcation and proximal ICA. Mild to moderate mixed plaque is also present within the distal cervical ICA Left carotid system: CCA and ICA patent within the neck. Moderate mixed plaque within the carotid bifurcation and proximal ICA. Narrowing of the proximal ICA of 40-50% Vertebral arteries: The dominant right vertebral artery is patent within the neck without significant stenosis (50% or greater). The non dominant left vertebral artery is patent within the neck. Moderate atherosclerotic narrowing at the origin of this vessel Skeleton: No acute bony abnormality or aggressive osseous lesion. Cervical spondylosis with multilevel disc space narrowing, posterior disc osteophytes, uncovertebral and facet hypertrophy. Spondylosis is greatest at C5-C6 and C6-C7 Other neck: Thyroid unremarkable. Nonspecific subcutaneous cystic appearing lesion within the right mid neck measuring 10 mm (series 100, image 411). Upper chest: No consolidation within the imaged lung apices Review of the MIP images confirms the above findings CTA HEAD FINDINGS Anterior circulation: The intracranial internal carotid arteries are patent. Atherosclerotic plaque within both vessels. Mild to moderate narrowing of the pre cavernous right ICA. 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 Some reconstitution of flow is seen within M2 and more distal right MCA branch vessels, although asymmetrically decreased as compared to the left. The M1 left middle cerebral artery is patent. No left M2 proximal branch occlusion or high-grade proximal stenosis is identified. The anterior cerebral arteries are patent. No intracranial aneurysm is identified. Posterior circulation: The intracranial vertebral arteries are patent without significant stenosis. Mild nonstenotic plaque within the  V4 right vertebral artery. The basilar artery is patent without significant stenosis. The posterior cerebral arteries are patent. There is a sizable right posterior communicating artery. The left posterior communicating artery is hypoplastic or absent. Moderate stenosis within the proximal P2 right PCA. Moderate stenosis within the mid P2 left PCA. Venous sinuses: Within limitations of contrast timing, no convincing thrombus. 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 These results were called by telephone at the time of interpretation on 04/05/2020 at 11:14 am to provider Dr. Rory Percy, who verbally acknowledged these results. IMPRESSION: CTA neck: 1. The left common and internal carotid arteries are patent within the neck. 40-50% atherosclerotic narrowing of the proximal left ICA. 2. The right common and internal carotid arteries are patent within the neck without significant stenosis. Moderate mixed plaque within the right carotid bifurcation and proximal ICA. Mild to moderate mixed plaque within the distal cervical ICA. 3. The vertebral arteries are patent within the neck bilaterally with the right being dominant. Moderate stenosis at the origin of the non dominant left vertebral artery. CTA head: 1. 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 There is some reconstitution of flow within M2 and more distal right MCA branch vessels, although asymmetrically decreased as compared  to the left. 2. Sites of up to moderate stenosis within both intracranial internal carotid arteries. 3. Moderate stenoses within the P2 posterior cerebral arteries bilaterally. Electronically Signed   By: Kellie Simmering DO   On: 04/05/2020 11:44    Procedures .Critical Care Performed by: Hayden Rasmussen, MD Authorized by: Hayden Rasmussen, MD   Critical care provider statement:    Critical care time (minutes):   45   Critical care time was exclusive of:  Separately billable procedures and treating other patients   Critical care was necessary to treat or prevent imminent or life-threatening deterioration of the following conditions:  CNS failure or compromise   Critical care was time spent personally by me on the following activities:  Discussions with consultants, evaluation of patient's response to treatment, examination of patient, ordering and performing treatments and interventions, ordering and review of laboratory studies, ordering and review of radiographic studies, pulse oximetry, re-evaluation of patient's condition, obtaining history from patient or surrogate, review of old charts and development of treatment plan with patient or surrogate   I assumed direction of critical care for this patient from another provider in my specialty: no     (including critical care time)  Medications Ordered in ED Medications  iohexol (OMNIPAQUE) 240 MG/ML injection (has no administration in time range)  labetalol (NORMODYNE) 5 MG/ML injection (has no administration in time range)   stroke: mapping our early stages of recovery book (has no administration in time range)  0.9 %  sodium chloride infusion ( Intravenous New Bag/Given 04/05/20 1633)  acetaminophen (TYLENOL) tablet 650 mg (has no administration in time range)    Or  acetaminophen (TYLENOL) 160 MG/5ML solution 650 mg (has no administration in time range)    Or  acetaminophen (TYLENOL) suppository 650 mg (has no administration in time range)  pantoprazole (PROTONIX) injection 40 mg (has no administration in time range)  senna-docusate (Senokot-S) tablet 1 tablet (has no administration in time range)  verapamil (ISOPTIN) injection (  Canceled Entry 04/05/20 1215)  cangrelor (KENGREAL) 50 MG SOLR (50 mg  See Procedure Record 04/05/20 1300)  cangrelor [facility]) bolus via infusion (1,491 mcg Intravenous Given 04/05/20 1305)  cangrelor (KENGREAL) 50,000 mcg  in sodium chloride 0.9 % 250 mL (200 mcg/mL) infusion (2 mcg/kg/min  99.4 kg  Handoff 04/05/20 1415)  clevidipine (CLEVIPREX) infusion 0.5 mg/mL (14 mg/hr Intravenous New Bag/Given 04/05/20 1452)  ondansetron (ZOFRAN) injection 4 mg (has no administration in time range)  insulin aspart (novoLOG) injection 0-15 Units (11 Units Subcutaneous Given 04/05/20 1629)  ticagrelor (BRILINTA) tablet 180 mg (has no administration in time range)  aspirin chewable tablet 81 mg (has no administration in time range)  clevidipine (CLEVIPREX) 0.5 MG/ML infusion (  Override pull for Anesthesia 04/05/20 1149)  alteplase (ACTIVASE) 1 mg/mL infusion 89.5 mg (0 mg Intravenous Stopped 04/05/20 1216)    Followed by  0.9 %  sodium chloride infusion ( Intravenous Stopped 04/05/20 1255)  fentaNYL (SUBLIMAZE) 100 MCG/2ML injection (  Override pull for Anesthesia 04/05/20 1139)  labetalol (NORMODYNE) injection 20 mg (20 mg Intravenous Given 04/05/20 1045)  iohexol (OMNIPAQUE) 240 MG/ML injection 150 mL (60 mLs Intra-arterial Contrast Given 04/05/20 1309)  iohexol (OMNIPAQUE) 300 MG/ML solution 50 mL (30 mLs Other Contrast Given 04/05/20 1208)  iohexol (OMNIPAQUE) 350 MG/ML injection 50 mL (50 mLs Intravenous Contrast Given 04/05/20 1142)  clevidipine (CLEVIPREX) 0.5 MG/ML infusion (  Override pull for Anesthesia 04/05/20 1238)    ED Course  I have reviewed  the triage vital signs and the nursing notes.  Pertinent labs & imaging results that were available during my care of the patient were reviewed by me and considered in my medical decision making (see chart for details).  Clinical Course as of Apr 05 1146  Mon Apr 05, 2020  1059 Patient was met by myself and stroke team including Dr. Rory Percy on arrival to emergency department.  He was emergently taken to CT.  CT showing likely LVO.  Neurology is attempting to reach IR for possible intervention.  We will continue to monitor airway.  IV blood pressure management due to his significant  hypertension   [MB]  1121 Received a call from radiologist that he has a left M1 occlusion.   [MB]    Clinical Course User Index [MB] Hayden Rasmussen, MD   MDM Rules/Calculators/A&P                         This patient complains of acute onset of left-sided weakness and dysarthria left neglect; this involves an extensive number of treatment Options and is a complaint that carries with it a high risk of complications and Morbidity. The differential includes stroke, bleed, hypoglycemia, metabolic derangement  I ordered, reviewed and interpreted labs, which included CBC with normal white count normal hemoglobin, chemistries with elevated glucose elevated creatinine  I ordered imaging studies which included CT head CT angio head and neck and I independently    visualized and interpreted imaging which showed acute M1 occlusion Additional history obtained from EMS Previous records obtained and reviewed in epic, no significant I consulted neurology and discussed lab and imaging findings  Critical Interventions: Emergent stroke activation and identification of candidate for interventional thrombectomy.  After the interventions stated above, I reevaluated the patient and found him to have significant neurologic deficits.  You will need to be moved to the interventional suite where he will undergo procedure.  I was available during the time the patient was in the department to manage the airway if he had any further decline.   Final Clinical Impression(s) / ED Diagnoses Final diagnoses:  Stroke [facility])  Fall    Rx / Superior Orders ED Discharge Orders    None       Hayden Rasmussen, MD 04/05/20 1723

## 2020-04-05 NOTE — Progress Notes (Signed)
CT Head reviewed by Dr. Karenann Cai.  There is no evidence of bleed post-procedure.  Cangelor infusion stopped. Give 180mg  Brilinta, aspirin 81mg  now.  Order placed for NGT placement for medication administration.   Brynda Greathouse, MS RD PA-C

## 2020-04-05 NOTE — Code Documentation (Signed)
Stroke Response Nurse Documentation Code Documentation  Rodney Estrada is a 69 y.o. male arriving to [place] ED via [facility] on 04/05/20 with past medical history of tobacco use and alcohol. Code stroke was activated by EMS. Patient was going to work. Got out of his truck and fell after taking a few steps. He was LKW at 1000 and now complaining of left sided weakness, right gaze, and slurred speech. SBP 200s with EMS. Patient had periods of apnea noted on route, placed on non rebreather mask. BP 260/149 upon arrival to ED.  Stroke team at the bedside on patient arrival. Labs drawn, CBG 309 and patient cleared for CT by Dr. Melina Copa. Patient to CT with team. NIHSS 19, see documentation for details and code stroke times. Patient with right gaze preference , left hemianopia, left facial droop, left arm weakness, left leg weakness, left decreased sensation, dysarthria  and Sensory  neglect on exam. The following imaging was completed: CT, CTA head and neck.  Patient is a candidate for tPA. Labetolol given for elevated BP. Cleviprex gtt started and titrated prior to tPA administration. BP 163/96 at 1115. tPA started at 1116. Consent signed for IR procedure. Patient taken to IR bay 8 at 1118 for intubation. Pulses marked. To IR suite at 1129. Care/Plan admit to ICU. Patient complained of need to void. Possible need for foley catheter to be placed in IR or ICU. Bedside handoff with IR RN Oswaldo Conroy and ICU RN Marlowe Kays.    Earma Reading  Stroke Response RN

## 2020-04-05 NOTE — Sedation Documentation (Signed)
Right femoral sheath removed. 35fr Angioseal closure device used.

## 2020-04-05 NOTE — Anesthesia Preprocedure Evaluation (Signed)
Anesthesia Evaluation  Patient identified by MRN, date of birth, ID band  Reviewed: NPO status , Unable to perform ROS - Chart review onlyPreop documentation limited or incomplete due to emergent nature of procedure.  Airway Mallampati: III   Neck ROM: Limited    Dental  (+) Teeth Intact, Poor Dentition   Pulmonary Current Smoker,    Pulmonary exam normal        Cardiovascular  Rhythm:Regular Rate:Normal     Neuro/Psych CVA, Residual Symptoms    GI/Hepatic   Endo/Other    Renal/GU      Musculoskeletal   Abdominal Normal abdominal exam  (+)   Peds  Hematology   Anesthesia Other Findings C collar in place  Reproductive/Obstetrics                             Anesthesia Physical Anesthesia Plan  ASA: III and emergent  Anesthesia Plan: General   Post-op Pain Management:    Induction: Intravenous  PONV Risk Score and Plan: Ondansetron  Airway Management Planned: Oral ETT and Video Laryngoscope Planned  Additional Equipment: Arterial line  Intra-op Plan:   Post-operative Plan: Possible Post-op intubation/ventilation  Informed Consent: I have reviewed the patients History and Physical, chart, labs and discussed the procedure including the risks, benefits and alternatives for the proposed anesthesia with the patient or authorized representative who has indicated his/her understanding and acceptance.     Dental advisory given and Only emergency history available  Plan Discussed with: CRNA  Anesthesia Plan Comments: (Pt consented. )        Anesthesia Quick Evaluation

## 2020-04-05 NOTE — Plan of Care (Signed)
OK to put NG tube as he needs ASA+Brilinta. CTH neg for bleed post procedure on Cangrelor.  -- Amie Portland, MD [facility] Pager: [phone] If 7pm to 7am, please call on call as listed on AMION.

## 2020-04-05 NOTE — Transfer of Care (Signed)
Immediate Anesthesia Transfer of Care Note  Patient: Rodney Estrada  Procedure(s) Performed: IR WITH ANESTHESIA (N/A )  Patient Location: NICU  Anesthesia Type:General  Level of Consciousness: drowsy and confused  Airway & Oxygen Therapy: Patient Spontanous Breathing and non-rebreather face mask  Post-op Assessment: Report given to RN and Post -op Vital signs reviewed and stable  Post vital signs: Reviewed  Last Vitals:  Vitals Value Taken Time  BP 134/75 04/05/20 1420  Temp    Pulse 77 04/05/20 1430  Resp 15 04/05/20 1430  SpO2 100 % 04/05/20 1430  Vitals shown include unvalidated device data.  Last Pain:  Vitals:   04/05/20 1105  PainSc: 0-No pain         Complications: No complications documented.

## 2020-04-05 NOTE — Progress Notes (Signed)
PHARMACIST CODE STROKE RESPONSE  Notified to mix tPA at 1059 by Dr. Rory Percy Delivered tPA to RN at 1102  tPA dose = 9mg  bolus over 1 minute followed by 80.5mg  for a total dose of 89.5mg  over 1 hour  Issues/delays encountered (if applicable): BP management before able to safely administer tPA requiring multiple labetalol doses + cleviprex.    Bertis Ruddy 04/05/20 11:22 AM

## 2020-04-05 NOTE — Procedures (Signed)
INTERVENTIONAL NEURORADIOLOGY BRIEF POSTPROCEDURE NOTE  DIAGNOSTIC CEREBRAL ANGIOGRAM  MECHANICAL THROMBECTOMY INTRACRANIAL ANGIOPLASTY AND STENTING  Attending: Dr. Pedro Earls  Assistant: None  Diagnosis: Right M1/MCA occlusion  Access site: RCFA  Access closure: 31F angioseal  Anesthesia: General  Medication used: refer to anesthesia documentation.  Complications: None  Estimated blood loss: 18mL  Specimen: None  Findings: Proximal right M1/MCA occlusion. 2 passes performed with complete recanalization (1 aspiration + 1 combination aspiration and solitaire). Underlying atherosclerotic plaque noted with severe stenosis. Follow-up angiogram showed slowed flow. Submaximal angioplasty performed with improvement of the anterograde flow. However, follow-up angiogram showed re stenosis. Stenting performed with a 2.5 x 15 mm resolute onyx stent. Complete restoration of flow noted. No evidence of in stent platelet aggregation on follow-up angiogram. Post procedure flat panel CT showed no evidence of hemorrhage.  The patient tolerated the procedure well without incident or complication and is in stable condition.   PLAN: - SBP 120-140 mmHg. - Bed rest post femoral puncture x6 hour - Follow-up CT before end of cangrelor infusion (17:06) - If no evidence of bleed on CT => load ASA + Brilinta

## 2020-04-05 NOTE — ED Notes (Signed)
BP on arrival 260/140

## 2020-04-05 NOTE — Progress Notes (Signed)
Orthopedic Tech Progress Note Patient Details:  Rodney Estrada 1950/09/29 [id number] RN called to request knee immobilizer for patient. Applied knee immobilizer.  Ortho Devices Type of Ortho Device: Knee Immobilizer Ortho Device/Splint Location: LLE Ortho Device/Splint Interventions: Application   Post Interventions Patient Tolerated: Well Instructions Provided: Care of device   Petra Kuba 04/05/2020, 1:49 PM

## 2020-04-05 NOTE — H&P (Addendum)
Neurology H & P    CC: Sudden onset of right gaze deviation with left-sided flaccidity, code stroke called in field  History is obtained from: EMS  HPI: Rodney Estrada is a 69 y.o. male tobacco abuse.  Patient was last seen normal at 10 AM this morning.  Apparently he had gotten out of his truck started walking and had a fall.  Patient's coworker came up to him and noticed that he had a right gaze deviation, was not moving his left side, had a left facial droop and was having periods of apnea.  EMS was called immediately.  Patient remained the same on arrival.  They noticed his blood pressure was 260/140.  Patient was immediately transferred to [facility] as code stroke.  On arrival patient had a right gaze deviation, left facial droop, severe dysarthria, hemiplegic on the left.  Patient was immediately brought back to CT which showed no intracranial hemorrhage CTA head and neck were obtained.  CTA of head showed a proximal right M1 occlusion.  Patient at that point was given TPA and transferred to interventional radiology for further evaluation and clot removal.   LKW: 10 AM on 04/05/2020 tpa given?:  Yes Premorbid modified Rankin scale (mRS): 0 NIH stroke score: 19   History reviewed.  Patient denies any pertinent past medical history.  Family History  Problem Relation Age of Onset  . Hypertension Mother   . Hypertension Father    Social History:   reports that he has been smoking. He has never used smokeless tobacco. He reports current alcohol use of about 10.0 standard drinks of alcohol per week. He reports previous drug use.  Medications  Current Facility-Administered Medications:  .   stroke: mapping our early stages of recovery book, , Does not apply, Once, Marliss Coots, PA-C .  alteplase (ACTIVASE) 1 mg/mL infusion 89.5 mg, 0.9 mg/kg, Intravenous, Once **FOLLOWED BY** 0.9 %  sodium chloride infusion, 50 mL, Intravenous, Once, Bertis Ruddy, RPH .  0.9 %  sodium  chloride infusion, , Intravenous, Continuous, Marliss Coots, PA-C .  acetaminophen (TYLENOL) tablet 650 mg, 650 mg, Oral, Q4H PRN **OR** acetaminophen (TYLENOL) 160 MG/5ML solution 650 mg, 650 mg, Per Tube, Q4H PRN **OR** acetaminophen (TYLENOL) suppository 650 mg, 650 mg, Rectal, Q4H PRN, Marliss Coots, PA-C .  aspirin 81 MG chewable tablet, , , ,  .  clevidipine (CLEVIPREX) 0.5 MG/ML infusion, , , ,  .  labetalol (NORMODYNE) injection 20 mg, 20 mg, Intravenous, Once **AND** clevidipine (CLEVIPREX) infusion 0.5 mg/mL, 0-21 mg/hr, Intravenous, Continuous, Marliss Coots, PA-C .  clopidogrel (PLAVIX) 300 MG tablet, , , ,  .  eptifibatide (INTEGRILIN) 20 MG/10ML injection, , , ,  .  fentaNYL (SUBLIMAZE) 100 MCG/2ML injection, , , ,  .  iohexol (OMNIPAQUE) 240 MG/ML injection 150 mL, 150 mL, Intra-arterial, Once PRN, de Sindy Messing, Erven Colla, MD .  iohexol (OMNIPAQUE) 240 MG/ML injection, , , ,  .  iohexol (OMNIPAQUE) 300 MG/ML solution 50 mL, 50 mL, Other, Once PRN, de Sindy Messing, [place], MD .  labetalol (NORMODYNE) 5 MG/ML injection, , , ,  .  nitroGLYCERIN 100 mcg/mL intra-arterial injection, , , ,  .  pantoprazole (PROTONIX) injection 40 mg, 40 mg, Intravenous, QHS, Smith, David R, PA-C .  senna-docusate (Senokot-S) tablet 1 tablet, 1 tablet, Oral, QHS PRN, Marliss Coots, PA-C .  ticagrelor (BRILINTA) 90 MG tablet, , , ,  .  tirofiban (AGGRASTAT) 5-0.9 MG/100ML-% injection, , , ,  .  verapamil (ISOPTIN) 2.5 MG/ML injection, , , ,  No current outpatient medications on file.  ROS: Severe dysarthria to which is not understandable.  Exam: Current vital signs: BP (!) 188/121   Ht 5\' 9"  (1.753 m)   Wt 99.4 kg   BMI 32.36 kg/m  Vital signs in last 24 hours: BP: (188-260)/(121-140) 188/121 (07/12 1113) Weight:  [99.4 kg] 99.4 kg (07/12 1100)   Constitutional: Appears well-developed and well-nourished.  Psych: Affect appropriate to situation Eyes: No scleral  injection HENT: No OP obstrucion Head: Normocephalic.  Cardiovascular: Normal rate and regular rhythm.  Respiratory: Effort normal, non-labored breathing GI: Soft.  No distension. There is no tenderness.  Skin: WDI  Neuro: Mental Status: Patient is awake, alert, severely dysarthric but for nurse was able to name pen.  Was able to follow simple commands using the right side but was plegic on the left. Cranial Nerves: II: Left hemianopsia III,IV, VI: Forced right gaze V: Facial sensation is symmetric to temperature VII: Significant left facial droop VIII: hearing is intact to voice X: Palat elevates symmetrically XI: Shoulder shrug is symmetric. XII: tongue is midline without atrophy or fasciculations.  Motor: 5/5 strength on the right with left hemiplegia Sensory: No sensation on the left arm or leg Deep Tendon Reflexes: 2+ and symmetric in the biceps and patellae.  Plantars: Toes are downgoing bilaterally.  Cerebellar: FNF and HKS are intact on the right  Labs I have reviewed labs in epic and the results pertinent to this consultation are:  CBC    Component Value Date/Time   WBC 9.0 04/05/2020 1049   RBC 5.51 04/05/2020 1049   HGB 16.3 04/05/2020 1056   HCT 48.0 04/05/2020 1056   PLT 205 04/05/2020 1049   MCV 84.8 04/05/2020 1049   MCH 29.0 04/05/2020 1049   MCHC 34.3 04/05/2020 1049   RDW 13.4 04/05/2020 1049   LYMPHSABS 2.8 04/05/2020 1049   MONOABS 0.6 04/05/2020 1049   EOSABS 0.3 04/05/2020 1049   BASOSABS 0.1 04/05/2020 1049    CMP     Component Value Date/Time   NA 137 04/05/2020 1056   K 3.3 (L) 04/05/2020 1056   CL 99 04/05/2020 1056   CO2 21 (L) 04/05/2020 1049   GLUCOSE 356 (H) 04/05/2020 1056   BUN 15 04/05/2020 1056   CREATININE 1.60 (H) 04/05/2020 1056   CALCIUM 9.5 04/05/2020 1049   PROT 7.0 04/05/2020 1049   ALBUMIN 3.6 04/05/2020 1049   AST 39 04/05/2020 1049   ALT 38 04/05/2020 1049   ALKPHOS 80 04/05/2020 1049   BILITOT 0.8  04/05/2020 1049   GFRNONAA 41 (L) 04/05/2020 1049   GFRAA 48 (L) 04/05/2020 1049    Lipid Panel  No results found for: CHOL, TRIG, HDL, CHOLHDL, VLDL, LDLCALC, LDLDIRECT   Imaging I have reviewed the images obtained:  CT-scan of the brain-no CT evidence of acute infarct or acute intracranial hemorrhage.  Numerous small chronic appearing lacunar infarcts within the cerebral white matter, bilateral basal ganglia and right thalamus.  Background mild chronic small vessel ischemic changes within the cerebral white matter.  Mild generalized parenchymal atrophy  CTA head and neck-the left common and internal carotid arteries are patent within the neck.  40 to 50% atherosclerotic narrowing of the proximal left ICA.  The right common and internal carotid arteries are patent within the neck without significant stenosis.  Moderate mixed plaque within the right carotid bifurcation and proximal ICA.  Mild to moderate mixed plaque within the distal  cervical ICA.  Vertebral arteries are patent within the neck bilaterally with right being dominant.  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  There is some reconstruction of flow within the M2 and more distal right MCA branch vessels, although asymmetrically decreased as compared to the left.  Sites of up to moderate stenosis with both intracranial internal carotid arteries.  Moderate stenosis within the P2 posterior cerebral arteries bilaterally.   CT C-spine done-because he had a fall IMPRESSION: No evidence of acute fracture to the cervical spine.  Cervical spondylosis as described and greatest at C5-C6 and C6-C7. Bilateral neural foraminal narrowing with suspected moderate/severe spinal canal stenosis at C6-C7. Bilateral neural foraminal narrowing with suspected at least moderate spinal canal stenosis at C5-C6  Cervical dextrocurvature.  Nonspecific cystic appearing 11 mm subcutaneous lesion within the mid right  neck.  Etta Quill PA-C [facility] [phone]  M-F  (9:00 am- 5:00 PM)  04/05/2020, 11:31 AM     Assessment:  This is a 69 year old male with no past medical history however appears to have tobacco abuse.  Patient was last seen normal at 10 AM.  Patient was noted by coworkers to have fallen with sudden onset of right gaze deviation and left hemiparesis along with left facial droop and severe dysarthria.  Patient's exam remained the same on arrival.  As noted above CTA revealed a severe right cut off on the right M1.  Patient at that point was brought to IR for right M1 distribution CVA  Plan: Acute Ischemic Stroke  Cerebral infarction due to embolism of right middle cerebral artery  Acuity: Acute  -Admit to: ICU -Hold Aspirin until 24 hour post tPA neuroimaging is stable and without evidence of bleeding -Blood pressure control, goal of SYS <180 -MRI/ECHO/A1C/Lipid panel. -Hyperglycemia management per SSI to maintain glucose 140-180mg /dL.  CNS  -Close neuro monitoring  Dysarthria  -NPO until cleared by speech -ST  Hemiplegia following cerebral infarction affecting left non-dominant side  -PT/OT  RESP -vent management per ICU   CV Hypertensive Emergency -Aggressive BP control, goal SBP < 180 -Currently on Cleviprex and will need to be titrated for goal of systolic blood pressure less than 180  Hyperlipidemia, unspecified  - Statin for goal LDL < 70  HEME -check AM labs -transfuse for hgb < 7  ENDO -Currently hyperglycemic -SSI -goal HgbA1c < 7  GI/GU Currently acute kidney insult with creatinine of 1.69.  Will need gentle hydration and continued evaluation  Fluid/Electrolyte Disorders Gentle hydration -Repeat labs -replete as needed  ID No active issues  Musculoskeletal -No acute issues.  No evidence of C-spine fracture.  Cervical spondylosis at multiple levels.  Cervical dextrocurvature.  Nonspecific cystic appearance subcutaneous  lesions within the mid right neck.  No further imaging recommended by radiology.  Possible Sepsis No active issues  Prophylaxis DVT: SCD GI: Protonix Bowel: Senokot  Diet: NPO until cleared by speech  Code Status: Full Code      Attending addendum Patient seen and examined as an acute code stroke. He has no past medical history according to him but is a tobacco abuser. Was last seen normal 10 AM this morning, apparently gotten out of the truck started walking, had a fall, EMS called who noted right-sided forced gaze, left hemiplegia. Brought in as an LVO positive code stroke. Taken for stat CT head-negative for acute bleed.  Dense right MCA. Significant delay in controlling blood pressures-came in with systolics in the [id number] multiple doses of labetalol and  starting Cleviprex at maximum doses to get blood pressure to goal to give IV TPA after obtaining consent. CTA showed a right M1 occlusion.  IR code stroke activated and taken in for emergent thrombectomy and possible stent placement. On my examination, he was alert awake oriented x3, speech was severely dysarthric, he had a forced right gaze with left homonymous hemianopsia and left facial droop, also had complete left hemiplegia. He also was neglecting the left side completely with both sensory and visual stimulation.  NIH stroke scale 18. Case discussed with Dr. Ladean Raya. Risks and benefits discussed with the patient who verbally consented but was unable to sign due to difficulty using the pen.  The RN witnessed the consent after confirming with the patient and anesthesia intubated the patient and he went to the IR suite for possible thrombectomy/stent placement. Assessment and plan: 69 year old with no significant past medical history-I am assuming he is not compliant with annual physicals, presenting with forced right gaze and left-sided plegia after he had a witnessed fall.  No bleed on the head CT. Symptoms  consistent with a right cerebral hemispheric stroke. Given IV TPA. CT head and neck consistent with right M1 occlusion. Taken in for emergent thrombectomy Recommendations: Post TPA care Post thrombectomy care Further details and recommendations above and Etta Quill, PA-C's note which I helped formulate.  CRITICAL CARE ATTESTATION Performed by: Amie Portland, MD Total critical care time: 65 minutes Critical care time was exclusive of separately billable procedures and treating other patients and/or supervising APPs/Residents/Students Critical care was necessary to treat or prevent imminent or life-threatening deterioration due to acute ischemic stroke, IV thrombolytic use, evaluation for mechanical thrombectomy This patient is critically ill and at significant risk for neurological worsening and/or death and care requires constant monitoring. Critical care was time spent personally by me on the following activities: development of treatment plan with patient and/or surrogate as well as nursing, discussions with consultants, evaluation of patient's response to treatment, examination of patient, obtaining history from patient or surrogate, ordering and performing treatments and interventions, ordering and review of laboratory studies, ordering and review of radiographic studies, pulse oximetry, re-evaluation of patient's condition, participation in multidisciplinary rounds and medical decision making of high complexity in the care of this patient.

## 2020-04-05 NOTE — Anesthesia Postprocedure Evaluation (Signed)
Anesthesia Post Note  Patient: Amit Leece  Procedure(s) Performed: IR WITH ANESTHESIA (N/A )     Patient location during evaluation: SICU Anesthesia Type: General Level of consciousness: awake and alert Pain management: pain level controlled Vital Signs Assessment: post-procedure vital signs reviewed and stable Respiratory status: spontaneous breathing, nonlabored ventilation, respiratory function stable and patient connected to nasal cannula oxygen Cardiovascular status: blood pressure returned to baseline and stable Postop Assessment: no apparent nausea or vomiting Anesthetic complications: no   No complications documented.  Last Vitals:  Vitals:   04/05/20 1445 04/05/20 1500  BP:  133/76  Pulse: 80 79  Resp: (!) 21 16  Temp:  36.5 C  SpO2: 97% 98%    Last Pain:  Vitals:   04/05/20 1500  TempSrc: Oral  PainSc: 0-No pain                 Effie Berkshire

## 2020-04-05 NOTE — Anesthesia Procedure Notes (Signed)
Procedure Name: Intubation Date/Time: 04/05/2020 11:26 AM Performed by: Barrington Ellison, CRNA Pre-anesthesia Checklist: Patient identified, Emergency Drugs available, Suction available and Patient being monitored Patient Re-evaluated:Patient Re-evaluated prior to induction Oxygen Delivery Method: Circle System Utilized Preoxygenation: Pre-oxygenation with 100% oxygen Induction Type: IV induction, Rapid sequence and Cricoid Pressure applied Ventilation: Mask ventilation without difficulty Laryngoscope Size: Glidescope and 4 Grade View: Grade I Tube type: Oral Tube size: 7.5 mm Number of attempts: 1 Airway Equipment and Method: Stylet and Oral airway Placement Confirmation: ETT inserted through vocal cords under direct vision,  positive ETCO2 and breath sounds checked- equal and bilateral Secured at: 22 cm Tube secured with: Tape Dental Injury: Teeth and Oropharynx as per pre-operative assessment

## 2020-04-05 NOTE — Discharge Instructions (Addendum)
Femoral Site Care °This sheet gives you information about how to care for yourself after your procedure. Your health care provider may also give you more specific instructions. If you have problems or questions, contact your health care provider. °What can I expect after the procedure? °After the procedure, it is common to have: °· Bruising that usually fades within 1-2 weeks. °· Tenderness at the site. °Follow these instructions at home: °Wound care °1. Follow instructions from your health care provider about how to take care of your insertion site. Make sure you: °? Wash your hands with soap and water before you change your bandage (dressing). If soap and water are not available, use hand sanitizer. °? Change your dressing as directed- pressure dressing removed 24 hours post-procedure (and switch for bandaid), bandaid removed 72 hours post-procedure °2. Do not take baths, swim, or use a hot tub for 7 days post-procedure. °3. You may shower 48 hours after the procedure or as told by your health care provider. °? Gently wash the site with plain soap and water. °? Pat the area dry with a clean towel. °? Do not rub the site. This may cause bleeding. °4. Check your femoral site every day for signs of infection. Check for: °? Redness, swelling, or pain. °? Fluid or blood. °? Warmth. °? Pus or a bad smell. °Activity °· Do not stoop, bend, or lift anything that is heavier than 10 lb (4.5 kg) for 2 weeks post-procedure. °· Do not drive self for 2 weeks post-procedure. °Contact a health care provider if you have: °· A fever or chills. °· You have redness, swelling, or pain around your insertion site. °Get help right away if: °· The catheter insertion area swells very fast. °· You pass out. °· You suddenly start to sweat or your skin gets clammy. °· The catheter insertion area is bleeding, and the bleeding does not stop when you hold steady pressure on the area. °· The area near or just beyond the catheter insertion site  becomes pale, cool, tingly, or numb. °These symptoms may represent a serious problem that is an emergency. Do not wait to see if the symptoms will go away. Get medical help right away. Call your local emergency services (911 in the [place] Do not drive yourself to the hospital. ° °This information is not intended to replace advice given to you by your health care provider. Make sure you discuss any questions you have with your health care provider. °Document Revised: 09/24/2017 Document Reviewed: 09/24/2017 °Elsevier Patient Education © 2020 [employer] °

## 2020-04-05 NOTE — Anesthesia Procedure Notes (Signed)
Arterial Line Insertion Start/End7/08/2020 11:45 AM, 04/05/2020 11:47 AM Performed by: Effie Berkshire, MD, anesthesiologist  Patient location: Pre-op. Preanesthetic checklist: patient identified, IV checked, site marked, risks and benefits discussed, surgical consent, monitors and equipment checked, pre-op evaluation, timeout performed and anesthesia consent Lidocaine 1% used for infiltration Left, radial was placed Catheter size: 20 Fr Hand hygiene performed  and maximum sterile barriers used   Attempts: 1 Procedure performed without using ultrasound guided technique. Following insertion, dressing applied and Biopatch. Post procedure assessment: normal and unchanged

## 2020-04-05 NOTE — Progress Notes (Signed)
Inpatient Diabetes Program Recommendations  AACE/ADA: New Consensus Statement on Inpatient Glycemic Control   Target Ranges:  Prepandial:   less than 140 mg/dL      Peak postprandial:   less than 180 mg/dL (1-2 hours)      Critically ill patients:  140 - 180 mg/dL  Results for EDI, GORNIAK (MRN [id number]) as of 04/05/2020 14:40  Ref. Range 04/05/2020 10:46  Glucose-Capillary Latest Ref Range: 70 - 99 mg/dL 309 (H)   Results for FAROUK, VIVERO (MRN [id number]) as of 04/05/2020 14:40  Ref. Range 04/05/2020 10:49 04/05/2020 10:56  Glucose Latest Ref Range: 70 - 99 mg/dL 351 (H) 356 (H)   Review of Glycemic Control  Diabetes history: No Outpatient Diabetes medications: NA Current orders for Inpatient glycemic control: None  Inpatient Diabetes Program Recommendations:    Insulin-Correction: Please consider ordering CBGs Q4H with Novolog 0-9 units Q4H.  HbgA1C: Ordered for 04/06/20.  Thanks, Barnie Alderman, RN, MSN, CDE Diabetes Coordinator Inpatient Diabetes Program [phone] (Team Pager from 8am to 5pm)

## 2020-04-06 ENCOUNTER — Encounter (HOSPITAL_COMMUNITY): Payer: Self-pay | Admitting: *Deleted

## 2020-04-06 ENCOUNTER — Inpatient Hospital Stay (HOSPITAL_COMMUNITY): Payer: 59

## 2020-04-06 DIAGNOSIS — F1020 Alcohol dependence, uncomplicated: Secondary | ICD-10-CM

## 2020-04-06 DIAGNOSIS — E782 Mixed hyperlipidemia: Secondary | ICD-10-CM

## 2020-04-06 DIAGNOSIS — E1165 Type 2 diabetes mellitus with hyperglycemia: Secondary | ICD-10-CM

## 2020-04-06 DIAGNOSIS — F17200 Nicotine dependence, unspecified, uncomplicated: Secondary | ICD-10-CM

## 2020-04-06 DIAGNOSIS — I161 Hypertensive emergency: Secondary | ICD-10-CM

## 2020-04-06 DIAGNOSIS — I340 Nonrheumatic mitral (valve) insufficiency: Secondary | ICD-10-CM

## 2020-04-06 DIAGNOSIS — E669 Obesity, unspecified: Secondary | ICD-10-CM

## 2020-04-06 DIAGNOSIS — F1410 Cocaine abuse, uncomplicated: Secondary | ICD-10-CM

## 2020-04-06 LAB — GLUCOSE, CAPILLARY
Glucose-Capillary: 124 mg/dL — ABNORMAL HIGH (ref 70–99)
Glucose-Capillary: 143 mg/dL — ABNORMAL HIGH (ref 70–99)
Glucose-Capillary: 154 mg/dL — ABNORMAL HIGH (ref 70–99)
Glucose-Capillary: 198 mg/dL — ABNORMAL HIGH (ref 70–99)
Glucose-Capillary: 217 mg/dL — ABNORMAL HIGH (ref 70–99)
Glucose-Capillary: 238 mg/dL — ABNORMAL HIGH (ref 70–99)

## 2020-04-06 LAB — HEMOGLOBIN A1C
Hgb A1c MFr Bld: 13.5 % — ABNORMAL HIGH (ref 4.8–5.6)
Mean Plasma Glucose: 340.75 mg/dL

## 2020-04-06 LAB — ECHOCARDIOGRAM COMPLETE
Height: 69 in
Weight: 3506.2 oz

## 2020-04-06 LAB — LIPID PANEL
Cholesterol: 206 mg/dL — ABNORMAL HIGH (ref 0–200)
HDL: 31 mg/dL — ABNORMAL LOW (ref >40)
LDL Cholesterol: UNDETERMINED mg/dL (ref 0–99)
Total CHOL/HDL Ratio: 6.6 RATIO
Triglycerides: 550 mg/dL — ABNORMAL HIGH (ref <150)
VLDL: UNDETERMINED mg/dL (ref 0–40)

## 2020-04-06 LAB — PHOSPHORUS: Phosphorus: 2.7 mg/dL (ref 2.5–4.6)

## 2020-04-06 LAB — MAGNESIUM: Magnesium: 1.7 mg/dL (ref 1.7–2.4)

## 2020-04-06 LAB — LDL CHOLESTEROL, DIRECT: Direct LDL: 108.2 mg/dL — ABNORMAL HIGH (ref 0–99)

## 2020-04-06 LAB — HIV ANTIBODY (ROUTINE TESTING W REFLEX): HIV Screen 4th Generation wRfx: NONREACTIVE

## 2020-04-06 MED ORDER — POTASSIUM CHLORIDE 20 MEQ/15ML (10%) PO SOLN
20.0000 meq | ORAL | Status: AC
  Administered 2020-04-06: 20 meq
  Filled 2020-04-06: qty 15

## 2020-04-06 MED ORDER — ADULT MULTIVITAMIN W/MINERALS CH: 1.0000 | ORAL_TABLET | Freq: Every day | ORAL | Status: DC

## 2020-04-06 MED ORDER — PANTOPRAZOLE SODIUM 40 MG PO PACK: 40.0000 mg | PACK | Freq: Every day | ORAL | Status: DC

## 2020-04-06 MED ORDER — LORAZEPAM 1 MG PO TABS: 1.0000 mg | ORAL_TABLET | ORAL | Status: AC | PRN

## 2020-04-06 MED ORDER — LORAZEPAM 2 MG/ML IJ SOLN
1.0000 mg | INTRAMUSCULAR | Status: AC | PRN
  Administered 2020-04-06: 2 mg via INTRAVENOUS
  Administered 2020-04-07 (×2): 1 mg via INTRAVENOUS
  Administered 2020-04-09: 2 mg via INTRAVENOUS
  Filled 2020-04-06: qty 2
  Filled 2020-04-06 (×3): qty 1

## 2020-04-06 MED ORDER — ENOXAPARIN SODIUM 40 MG/0.4ML ~~LOC~~ SOLN
40.0000 mg | SUBCUTANEOUS | Status: DC
  Administered 2020-04-06 – 2020-04-18 (×13): 40 mg via SUBCUTANEOUS
  Filled 2020-04-06 (×13): qty 0.4

## 2020-04-06 MED ORDER — SENNOSIDES-DOCUSATE SODIUM 8.6-50 MG PO TABS
1.0000 | ORAL_TABLET | Freq: Every evening | ORAL | Status: DC | PRN
  Administered 2020-04-06: 1
  Filled 2020-04-06: qty 1

## 2020-04-06 MED ORDER — AMLODIPINE BESYLATE 10 MG PO TABS: 10.0000 mg | ORAL_TABLET | Freq: Every day | ORAL | Status: DC

## 2020-04-06 MED ORDER — ASPIRIN 81 MG PO CHEW
81.0000 mg | CHEWABLE_TABLET | Freq: Every day | ORAL | Status: DC
  Administered 2020-04-06 – 2020-04-14 (×9): 81 mg
  Filled 2020-04-06 (×9): qty 1

## 2020-04-06 MED ORDER — FOLIC ACID 1 MG PO TABS: 1.0000 mg | ORAL_TABLET | Freq: Every day | ORAL | Status: DC

## 2020-04-06 MED ORDER — PANTOPRAZOLE SODIUM 40 MG PO TBEC: 40.0000 mg | DELAYED_RELEASE_TABLET | Freq: Every day | ORAL | Status: DC

## 2020-04-06 MED ORDER — POTASSIUM CHLORIDE 20 MEQ/15ML (10%) PO SOLN: 20.0000 meq | ORAL | Status: DC

## 2020-04-06 MED ORDER — TICAGRELOR 90 MG PO TABS
90.0000 mg | ORAL_TABLET | Freq: Two times a day (BID) | ORAL | Status: DC
  Administered 2020-04-06 – 2020-04-12 (×14): 90 mg
  Filled 2020-04-06 (×14): qty 1

## 2020-04-06 MED ORDER — INSULIN DETEMIR 100 UNIT/ML ~~LOC~~ SOLN
12.0000 [IU] | Freq: Two times a day (BID) | SUBCUTANEOUS | Status: DC
  Administered 2020-04-06 – 2020-04-12 (×13): 12 [IU] via SUBCUTANEOUS
  Filled 2020-04-06 (×14): qty 0.12

## 2020-04-06 MED ORDER — NICOTINE 14 MG/24HR TD PT24
14.0000 mg | MEDICATED_PATCH | Freq: Every day | TRANSDERMAL | Status: DC
  Administered 2020-04-06 – 2020-04-19 (×14): 14 mg via TRANSDERMAL
  Filled 2020-04-06 (×14): qty 1

## 2020-04-06 MED ORDER — ADULT MULTIVITAMIN LIQUID CH
15.0000 mL | Freq: Every day | ORAL | Status: DC
  Administered 2020-04-06 – 2020-04-07 (×2): 15 mL
  Filled 2020-04-06 (×2): qty 15

## 2020-04-06 MED ORDER — PANTOPRAZOLE SODIUM 40 MG PO PACK
40.0000 mg | PACK | Freq: Every day | ORAL | Status: DC
  Administered 2020-04-06 – 2020-04-14 (×9): 40 mg
  Filled 2020-04-06 (×9): qty 20

## 2020-04-06 MED ORDER — ATORVASTATIN CALCIUM 80 MG PO TABS: 80.0000 mg | ORAL_TABLET | Freq: Every day | ORAL | Status: DC

## 2020-04-06 MED ORDER — THIAMINE HCL 100 MG/ML IJ SOLN
100.0000 mg | Freq: Every day | INTRAMUSCULAR | Status: DC
  Administered 2020-04-06 – 2020-04-09 (×3): 100 mg via INTRAVENOUS
  Filled 2020-04-06 (×4): qty 2

## 2020-04-06 MED ORDER — ATORVASTATIN CALCIUM 80 MG PO TABS
80.0000 mg | ORAL_TABLET | Freq: Every day | ORAL | Status: DC
  Administered 2020-04-06 – 2020-04-14 (×9): 80 mg
  Filled 2020-04-06 (×4): qty 1
  Filled 2020-04-06: qty 2
  Filled 2020-04-06 (×4): qty 1

## 2020-04-06 MED ORDER — HYDRALAZINE HCL 20 MG/ML IJ SOLN
5.0000 mg | INTRAMUSCULAR | Status: DC | PRN
  Administered 2020-04-08 (×2): 20 mg via INTRAVENOUS
  Administered 2020-04-10 (×2): 10 mg via INTRAVENOUS
  Administered 2020-04-12: 20 mg via INTRAVENOUS
  Administered 2020-04-16: 10 mg via INTRAVENOUS
  Filled 2020-04-06 (×8): qty 1

## 2020-04-06 MED ORDER — AMLODIPINE BESYLATE 10 MG PO TABS
10.0000 mg | ORAL_TABLET | Freq: Every day | ORAL | Status: DC
  Administered 2020-04-06 – 2020-04-09 (×4): 10 mg
  Filled 2020-04-06 (×4): qty 1

## 2020-04-06 MED ORDER — FOLIC ACID 1 MG PO TABS
1.0000 mg | ORAL_TABLET | Freq: Every day | ORAL | Status: DC
  Administered 2020-04-06 – 2020-04-14 (×10): 1 mg
  Filled 2020-04-06 (×9): qty 1

## 2020-04-06 MED ORDER — THIAMINE HCL 100 MG PO TABS
100.0000 mg | ORAL_TABLET | Freq: Every day | ORAL | Status: DC
  Administered 2020-04-08 – 2020-04-19 (×8): 100 mg via ORAL
  Filled 2020-04-06 (×12): qty 1

## 2020-04-06 NOTE — Progress Notes (Signed)
Inpatient Diabetes Program Recommendations  AACE/ADA: New Consensus Statement on Inpatient Glycemic Control (2015)  Target Ranges:  Prepandial:   less than 140 mg/dL      Peak postprandial:   less than 180 mg/dL (1-2 hours)      Critically ill patients:  140 - 180 mg/dL   Lab Results  Component Value Date   GLUCAP 238 (H) 04/06/2020   HGBA1C 13.5 (H) 04/06/2020    Review of Glycemic Control Results for JULES, BATY (MRN [id number]) as of 04/06/2020 10:13  Ref. Range 04/05/2020 19:33 04/05/2020 23:17 04/06/2020 03:32 04/06/2020 07:33  Glucose-Capillary Latest Ref Range: 70 - 99 mg/dL 285 (H) 233 (H) 217 (H) 238 (H)   Diabetes history: New onset? Outpatient Diabetes medications: none Current orders for Inpatient glycemic control: Novolog 0-15 units Q4H  Inpatient Diabetes Program Recommendations:    Per A1C, indicates new diagnosis per ADA guidelines for diabetes. Anticipate need for insulin at discharge.   Consider adding Levemir 12 units BID.  Will plan to see when appropriate.  Thanks, Bronson Curb, MSN, RNC-OB Diabetes Coordinator [phone] (8a-5p)

## 2020-04-06 NOTE — Progress Notes (Signed)
Neuro MD Lindzen paged about patient's blood pressure. Blood pressure via the arterial line is above the goal of systolic of 521. Last cuff pressure within range of systolic 747-159. Cleviprex drip at max rate. New verbal orders from MD to increase max dosage of cleviprex to 30 mg/hour. Will continue to monitor.

## 2020-04-06 NOTE — Progress Notes (Signed)
°  Echocardiogram 2D Echocardiogram has been performed.  Johny Chess 04/06/2020, 2:52 PM

## 2020-04-06 NOTE — Progress Notes (Signed)
OT Cancellation Note  Patient Details Name: Kepler Mccabe MRN: [id number] DOB: 12/07/1950   Cancelled Treatment:    Reason Eval/Treat Not Completed: Active bedrest order  OT order received and appreciated however this conflicts with current bedrest order set. Please increase activity tolerance as appropriate and remove bedrest from orders. . Please contact OT at [phone] if bed rest order is discontinued. OT will hold evaluation at this time and will check back as time allows pending increased activity orders.  Jeri Modena 04/06/2020, 7:36 AM

## 2020-04-06 NOTE — Evaluation (Signed)
Clinical/Bedside Swallow Evaluation Patient Details  Name: Rodney Estrada MRN: [id number] Date of Birth: 1951/01/10  Today's Date: 04/06/2020 Time: SLP Start Time (ACUTE ONLY): 1440 SLP Stop Time (ACUTE ONLY): 1500 SLP Time Calculation (min) (ACUTE ONLY): 20 min  Past Medical History: History reviewed. No pertinent past medical history. Past Surgical History:  Past Surgical History:  Procedure Laterality Date  . IR PERCUTANEOUS ART THROMBECTOMY/INFUSION INTRACRANIAL INC DIAG ANGIO  04/05/2020      . RADIOLOGY WITH ANESTHESIA N/A 04/05/2020   Procedure: IR WITH ANESTHESIA;  Surgeon: Radiologist, Medication, MD;  Location: [facility];  Service: Radiology;  Laterality: N/A;   HPI:  69yo male admitted 04/05/20 with a fall and sudden onset right gaze preference,  left side flaccidity, and severe dysarthria. PMH: tobacco abuse. 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 Pt given TPA   Assessment / Plan / Recommendation Clinical Impression  Pt was seen at bedside to assess readiness for po intake. Pt was awake upon arrival of SLP, but kept eyes closed throughout the evaluation. Oral care was completed with suction, which pt tolerated well. Pt presents with adequate natural dentition, and family present indicates no awareness of difficulty swallowing. Pt presents with marked left facial weakness, and lingual deviation. Volitional cough is weak. Speech is mild-moderately dysarthric. Pt was given one individual ice chip via teaspoon. Extended oral prep noted, with suspected delayed swallow reflex. Strong reflexive cough was elicited after the swallow. As the session progressed, pt required more stimulation to maintain alertness. Eventually sternal rub and cold washcloth had to be utilized. No further PO trials were given at this time. Recommend strict NPO status, including meds. SLP will follow up next date to reassess readiness for PO diet.  SLE deferred at this time, due to insufficient alertness.  SLP Visit  Diagnosis: Dysphagia, unspecified (R13.10)    Aspiration Risk  Severe aspiration risk    Diet Recommendation NPO   Medication Administration: Via alternative means    Other  Recommendations Oral Care Recommendations: Oral care QID   Follow up Recommendations Other (comment) (TBD)      Frequency and Duration min 2x/week  1 week;2 weeks       Prognosis Prognosis for Safe Diet Advancement: Good      Swallow Study   General Date of Onset: 04/05/20 HPI: 69yo male admitted 04/05/20 with a fall and sudden onset right gaze preference,  left side flaccidity, and severe dysarthria. PMH: tobacco abuse. 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 Pt given TPA Type of Study: Bedside Swallow Evaluation Previous Swallow Assessment: none Diet Prior to this Study: NPO;NG Tube Temperature Spikes Noted: No Respiratory Status: Nasal cannula History of Recent Intubation: No Behavior/Cognition: Cooperative;Requires cueing;Lethargic/Drowsy Oral Cavity Assessment: Within Functional Limits Oral Care Completed by SLP: Yes Oral Cavity - Dentition: Adequate natural dentition Vision: Impaired for self-feeding Self-Feeding Abilities: Total assist Patient Positioning: Upright in bed Baseline Vocal Quality: Normal Volitional Cough: Weak Volitional Swallow: Unable to elicit    Oral/Motor/Sensory Function Overall Oral Motor/Sensory Function: Moderate impairment Facial ROM: Reduced left Facial Symmetry: Abnormal symmetry left Facial Strength: Reduced left Lingual ROM: Reduced left Lingual Symmetry: Abnormal symmetry left Lingual Strength: Reduced Mandible: Within Functional Limits   Ice Chips Ice chips: Impaired Presentation: Spoon Oral Phase Impairments: Poor awareness of bolus Oral Phase Functional Implications: Prolonged oral transit Pharyngeal Phase Impairments: Cough - Delayed;Suspected delayed Swallow    Arissa Fagin B. [name], [facility], [place] Speech Language Pathologist Office: [phone] Pager:  [phone]  Shonna Chock 04/06/2020,3:06 PM

## 2020-04-06 NOTE — Progress Notes (Signed)
STROKE TEAM PROGRESS NOTE   INTERVAL HISTORY RN at bedside. Pt lying in bed, right gaze preference but able to have left gaze, still has left hemiplegia. BP high on maxed cleviprex. Pending MRI and MRA. Concerning for alcohol withdraw, put on CIWA protocol. Pt stated that he only drinks 2 beers per day. He smokes 1 PPD. He used cocaine last Friday.   Vitals:   04/06/20 0830 04/06/20 0900 04/06/20 0930 04/06/20 1000  BP: (!) 126/111 (!) 116/56    Pulse: 93 94    Resp: (!) 27 20    Temp:      TempSrc:      SpO2: 96% 95% 95% 94%  Weight:      Height:       CBC:  Recent Labs  Lab 04/05/20 1049 04/05/20 1056  WBC 9.0  --   NEUTROABS 5.2  --   HGB 16.0 16.3  HCT 46.7 48.0  MCV 84.8  --   PLT 205  --    Basic Metabolic Panel:  Recent Labs  Lab 04/05/20 1049 04/05/20 1056  NA 136 137  K 3.4* 3.3*  CL 99 99  CO2 21*  --   GLUCOSE 351* 356*  BUN 13 15  CREATININE 1.69* 1.60*  CALCIUM 9.5  --    Lipid Panel:  Recent Labs  Lab 04/06/20 0613  CHOL 206*  TRIG 550*  HDL 31*  CHOLHDL 6.6  VLDL UNABLE TO CALCULATE IF TRIGLYCERIDE OVER 400 mg/dL  LDLCALC UNABLE TO CALCULATE IF TRIGLYCERIDE OVER 400 mg/dL   HgbA1c:  Recent Labs  Lab 04/06/20 0613  HGBA1C 13.5*   Urine Drug Screen:  Recent Labs  Lab 04/05/20 1956  LABOPIA NONE DETECTED  COCAINSCRNUR POSITIVE*  LABBENZ NONE DETECTED  AMPHETMU NONE DETECTED  THCU NONE DETECTED  LABBARB NONE DETECTED    Alcohol Level  Recent Labs  Lab 04/05/20 1049  ETH <10    IMAGING past 24 hours CT HEAD WO CONTRAST  Result Date: 04/05/2020 CLINICAL DATA:  Stroke. Thrombectomy and stenting right MCA today. Patient received tPA. EXAM: CT HEAD WITHOUT CONTRAST TECHNIQUE: Contiguous axial images were obtained from the base of the skull through the vertex without intravenous contrast. COMPARISON:  CT head earlier today FINDINGS: Brain: Interval development of low-density in the right MCA territory compatible with acute infarct.  This involves the insula, lateral temporal lobe, right frontal lobe and right basal ganglia. There is mild hyperdensity in the right putamen which may represent contrast staining or slight hemorrhage. Otherwise no acute hemorrhage. Generalized atrophy without hydrocephalus. 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 Vascular: Interval stenting right MCA . No hyperdense vessel. Contrast enhanced vessels due to earlier angiography. Skull: Negative Sinuses/Orbits: Mild mucosal edema paranasal sinuses. No orbital lesion. Other: None IMPRESSION: Right MCA thrombectomy and stenting earlier today. Developing right MCA infarct. Mild hyperdensity in the right putamen which may represent contrast staining from acute infarct versus mild hemorrhage. These results were called by telephone at the time of interpretation on 04/05/2020 at 5:14 pm to provider [place] , who verbally acknowledged these results. Electronically Signed   By: Franchot Gallo M.D.   On: 04/05/2020 17:15   CT C-SPINE NO CHARGE  Result Date: 04/05/2020 CLINICAL DATA:  Stroke. EXAM: CT CERVICAL SPINE WITHOUT CONTRAST TECHNIQUE: Multidetector CT imaging of the cervical spine was performed without intravenous contrast. Multiplanar CT image reconstructions were also generated. COMPARISON:  Open FINDINGS: Alignment: Cervical dextrocurvature. Straightening of the expected cervical  lordosis. No significant spondylolisthesis. Skull base and vertebrae: The basion-dental and atlanto-dental intervals are maintained.No evidence of acute fracture to the cervical spine. Well-corticated fragment adjacent to the T1 spinous process, likely chronic Soft tissues and spinal canal: No prevertebral fluid or swelling. No visible canal hematoma. Nonspecific 11 mm cystic appearing subcutaneous lesion within the mid right neck (series 18, image 66). Disc levels: Cervical spondylosis. Most notably at C5-C6 and C6-C7, there is advanced disc  space narrowing with posterior disc osteophytes as well as uncovertebral and facet hypertrophy. Bilateral neural foraminal narrowing with suspected at least moderate spinal canal stenosis at C5-C6. Bilateral neural foraminal narrowing with suspected moderate/severe spinal canal stenosis at C6-C7 Upper chest: No consolidation within the imaged lung apices. No visible pneumothorax. IMPRESSION: No evidence of acute fracture to the cervical spine. Cervical spondylosis as described and greatest at C5-C6 and C6-C7. Bilateral neural foraminal narrowing with suspected moderate/severe spinal canal stenosis at C6-C7. Bilateral neural foraminal narrowing with suspected at least moderate spinal canal stenosis at C5-C6 Cervical dextrocurvature. Nonspecific cystic appearing 11 mm subcutaneous lesion within the mid right neck. Electronically Signed   By: Kellie Simmering DO   On: 04/05/2020 11:59   DG Abd Portable 1V  Result Date: 04/05/2020 CLINICAL DATA:  69 year old male status post feeding tube placement. EXAM: PORTABLE ABDOMEN - 1 VIEW COMPARISON:  None. FINDINGS: Partially visualized enteric tube with side port and tip in the left upper abdomen likely in the proximal stomach. No bowel dilatation. There is severe dilatation of the left renal collecting system containing excreted contrast. There is atherosclerotic calcification of the aorta. The osseous structures are intact. IMPRESSION: 1. Enteric tube with tip in the proximal stomach. 2. Severe left hydronephrosis. Electronically Signed   By: Anner Crete M.D.   On: 04/05/2020 19:42   CT HEAD CODE STROKE WO CONTRAST  Addendum Date: 04/05/2020   ADDENDUM REPORT: 04/05/2020 11:14 ADDENDUM: These results were called by telephone at the time of interpretation on 04/05/2020 at 11:14 am to provider Dr. Rory Percy, who verbally acknowledged these results. Electronically Signed   By: Kellie Simmering DO   On: 04/05/2020 11:14   Result Date: 04/05/2020 CLINICAL DATA:  Code stroke.  Neuro deficit, acute, stroke suspected. EXAM: CT HEAD WITHOUT CONTRAST TECHNIQUE: Contiguous axial images were obtained from the base of the skull through the vertex without intravenous contrast. COMPARISON:  No pertinent prior studies available for comparison. FINDINGS: Brain: The examination is mildly motion degraded. Mild generalized parenchymal atrophy. There is no acute intracranial hemorrhage. No acute demarcated cortical infarct is identified. There are numerous small lacunar infarcts within the bilateral cerebral white matter, right thalamus and bilateral basal ganglia which appear chronic. Background mild patchy hypoattenuation within the cerebral white matter which is nonspecific, but consistent with chronic small vessel ischemic disease. No extra-axial fluid collection. No evidence of intracranial mass. No midline shift. Vascular: No hyperdense vessel.  Atherosclerotic calcifications Skull: Normal. Negative for fracture or focal lesion. Sinuses/Orbits: Visualized orbits show no acute finding. Small right maxillary sinus mucous retention cyst. Mild ethmoid sinus mucosal thickening. No significant mastoid effusion. ASPECTS [facility] Stroke Program Early CT Score) - Ganglionic level infarction (caudate, lentiform nuclei, internal capsule, insula, M1-M3 cortex): 7 - Supraganglionic infarction (M4-M6 cortex): 3 Total score (0-10 with 10 being normal): 10 IMPRESSION: 1. Mildly motion degraded examination. 2. No CT evidence of acute infarct or acute intracranial hemorrhage. 3. Numerous small chronic appearing lacunar infarcts within the cerebral white matter, bilateral basal ganglia and right thalamus. Background  mild chronic small vessel ischemic changes within the cerebral white matter. 4. Mild generalized parenchymal atrophy. 5. Mild ethmoid sinus mucosal thickening. Small right maxillary sinus mucous retention cyst. Electronically Signed: By: Kellie Simmering DO On: 04/05/2020 11:11   CT ANGIO HEAD CODE  STROKE  Result Date: 04/05/2020 CLINICAL DATA:  Stroke, follow-up. EXAM: CT ANGIOGRAPHY HEAD AND NECK TECHNIQUE: Multidetector CT imaging of the head and neck was performed using the standard protocol during bolus administration of intravenous contrast. Multiplanar CT image reconstructions and MIPs were obtained to evaluate the vascular anatomy. Carotid stenosis measurements (when applicable) are obtained utilizing NASCET criteria, using the distal internal carotid diameter as the denominator. CONTRAST:  Administered contrast not known at this time. COMPARISON:  Concurrently performed noncontrast head CT. FINDINGS: CTA NECK FINDINGS Aortic arch: Standard aortic branching. Atherosclerotic plaque within the visualized aortic arch and proximal major branch vessels of the neck. No hemodynamically significant innominate or proximal subclavian artery stenosis. Right carotid system: CCA and ICA patent within the neck without significant stenosis (50% or greater). Moderate mixed plaque within the carotid bifurcation and proximal ICA. Mild to moderate mixed plaque is also present within the distal cervical ICA Left carotid system: CCA and ICA patent within the neck. Moderate mixed plaque within the carotid bifurcation and proximal ICA. Narrowing of the proximal ICA of 40-50% Vertebral arteries: The dominant right vertebral artery is patent within the neck without significant stenosis (50% or greater). The non dominant left vertebral artery is patent within the neck. Moderate atherosclerotic narrowing at the origin of this vessel Skeleton: No acute bony abnormality or aggressive osseous lesion. Cervical spondylosis with multilevel disc space narrowing, posterior disc osteophytes, uncovertebral and facet hypertrophy. Spondylosis is greatest at C5-C6 and C6-C7 Other neck: Thyroid unremarkable. Nonspecific subcutaneous cystic appearing lesion within the right mid neck measuring 10 mm (series 100, image 411). Upper chest: No  consolidation within the imaged lung apices Review of the MIP images confirms the above findings CTA HEAD FINDINGS Anterior circulation: The intracranial internal carotid arteries are patent. Atherosclerotic plaque within both vessels. Mild to moderate narrowing of the pre cavernous right ICA. 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 Some reconstitution of flow is seen within M2 and more distal right MCA branch vessels, although asymmetrically decreased as compared to the left. The M1 left middle cerebral artery is patent. No left M2 proximal branch occlusion or high-grade proximal stenosis is identified. The anterior cerebral arteries are patent. No intracranial aneurysm is identified. Posterior circulation: The intracranial vertebral arteries are patent without significant stenosis. Mild nonstenotic plaque within the V4 right vertebral artery. The basilar artery is patent without significant stenosis. The posterior cerebral arteries are patent. There is a sizable right posterior communicating artery. The left posterior communicating artery is hypoplastic or absent. Moderate stenosis within the proximal P2 right PCA. Moderate stenosis within the mid P2 left PCA. Venous sinuses: Within limitations of contrast timing, no convincing thrombus. 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 These results were called by telephone at the time of interpretation on 04/05/2020 at 11:14 am to provider Dr. Rory Percy, who verbally acknowledged these results. IMPRESSION: CTA neck: 1. The left common and internal carotid arteries are patent within the neck. 40-50% atherosclerotic narrowing of the proximal left ICA. 2. The right common and internal carotid  arteries are patent within the neck without significant  stenosis. Moderate mixed plaque within the right carotid bifurcation and proximal ICA. Mild to moderate mixed plaque within the distal cervical ICA. 3. The vertebral arteries are patent within the neck bilaterally with the right being dominant. Moderate stenosis at the origin of the non dominant left vertebral artery. CTA head: 1. 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 There is some reconstitution of flow within M2 and more distal right MCA branch vessels, although asymmetrically decreased as compared to the left. 2. Sites of up to moderate stenosis within both intracranial internal carotid arteries. 3. Moderate stenoses within the P2 posterior cerebral arteries bilaterally. Electronically Signed   By: Kellie Simmering DO   On: 04/05/2020 11:44   CT ANGIO NECK CODE STROKE  Result Date: 04/05/2020 CLINICAL DATA:  Stroke, follow-up. EXAM: CT ANGIOGRAPHY HEAD AND NECK TECHNIQUE: Multidetector CT imaging of the head and neck was performed using the standard protocol during bolus administration of intravenous contrast. Multiplanar CT image reconstructions and MIPs were obtained to evaluate the vascular anatomy. Carotid stenosis measurements (when applicable) are obtained utilizing NASCET criteria, using the distal internal carotid diameter as the denominator. CONTRAST:  Administered contrast not known at this time. COMPARISON:  Concurrently performed noncontrast head CT. FINDINGS: CTA NECK FINDINGS Aortic arch: Standard aortic branching. Atherosclerotic plaque within the visualized aortic arch and proximal major branch vessels of the neck. No hemodynamically significant innominate or proximal subclavian artery stenosis. Right carotid system: CCA and ICA patent within the neck without significant stenosis (50% or greater). Moderate mixed plaque within the carotid bifurcation and proximal ICA. Mild to moderate mixed plaque is also present within the distal cervical ICA Left carotid system: CCA  and ICA patent within the neck. Moderate mixed plaque within the carotid bifurcation and proximal ICA. Narrowing of the proximal ICA of 40-50% Vertebral arteries: The dominant right vertebral artery is patent within the neck without significant stenosis (50% or greater). The non dominant left vertebral artery is patent within the neck. Moderate atherosclerotic narrowing at the origin of this vessel Skeleton: No acute bony abnormality or aggressive osseous lesion. Cervical spondylosis with multilevel disc space narrowing, posterior disc osteophytes, uncovertebral and facet hypertrophy. Spondylosis is greatest at C5-C6 and C6-C7 Other neck: Thyroid unremarkable. Nonspecific subcutaneous cystic appearing lesion within the right mid neck measuring 10 mm (series 100, image 411). Upper chest: No consolidation within the imaged lung apices Review of the MIP images confirms the above findings CTA HEAD FINDINGS Anterior circulation: The intracranial internal carotid arteries are patent. Atherosclerotic plaque within both vessels. Mild to moderate narrowing of the pre cavernous right ICA. 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 Some reconstitution of flow is seen within M2 and more distal right MCA branch vessels, although asymmetrically decreased as compared to the left. The M1 left middle cerebral artery is patent. No left M2 proximal branch occlusion or high-grade proximal stenosis is identified. The anterior cerebral arteries are patent. No intracranial aneurysm is identified. Posterior circulation: The intracranial vertebral arteries are patent without significant stenosis. Mild nonstenotic plaque within the V4 right vertebral artery. The basilar artery is patent without significant stenosis. The posterior cerebral arteries are patent. There is a sizable right posterior communicating artery. The  left posterior communicating artery is hypoplastic or absent. Moderate stenosis within the proximal P2 right PCA. Moderate stenosis within the  mid P2 left PCA. Venous sinuses: Within limitations of contrast timing, no convincing thrombus. 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 These results were called by telephone at the time of interpretation on 04/05/2020 at 11:14 am to provider Dr. Rory Percy, who verbally acknowledged these results. IMPRESSION: CTA neck: 1. The left common and internal carotid arteries are patent within the neck. 40-50% atherosclerotic narrowing of the proximal left ICA. 2. The right common and internal carotid arteries are patent within the neck without significant stenosis. Moderate mixed plaque within the right carotid bifurcation and proximal ICA. Mild to moderate mixed plaque within the distal cervical ICA. 3. The vertebral arteries are patent within the neck bilaterally with the right being dominant. Moderate stenosis at the origin of the non dominant left vertebral artery. CTA head: 1. 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 There is some reconstitution of flow within M2 and more distal right MCA branch vessels, although asymmetrically decreased as compared to the left. 2. Sites of up to moderate stenosis within both intracranial internal carotid arteries. 3. Moderate stenoses within the P2 posterior cerebral arteries bilaterally. Electronically Signed   By: Kellie Simmering DO   On: 04/05/2020 11:44    PHYSICAL EXAM  Temp:  [97.5 F (36.4 C)-98.5 F (36.9 C)] 98.2 F (36.8 C) (07/13 1200) Pulse Rate:  [79-100] 94 (07/13 0900) Resp:  [3-29] 20 (07/13 0900) BP: (108-156)/(56-121) 116/56 (07/13 0900) SpO2:  [90 %-98 %] 94 % (07/13 1000) Arterial Line BP: (115-161)/(49-73) 140/49 (07/13 1000)  General - Well nourished, well developed, mildly  agitated.  Ophthalmologic - fundi not visualized due to noncooperation.  Cardiovascular - Regular rhythm and rate.  Neuro - awake alert, severe dysarthria, no aphasia, following commands. Able to name and repeat but with very dysarthric voice. Orientated to self, age and time. Right gaze preference but able to have left gaze, blinking to visual threat bilaterally. Left facial droop. Tongue midline. Left UE flaccid. Left LE mid withdraw to pain. RUE and RLE 5/5. Left babinski positive. Sensation symmetrical subjectively. Right FTN intact. Gait not tested.  ASSESSMENT/PLAN Mr. Markies Mowatt is a 69 y.o. male with history of tobacco use presenting with L sided flaccidity, R gaze, L facial droop, periods of apnea in setting of BP 260/140. IV tPA administered 04/05/2020 at 1116. Taken to IR for R M1 occlusion.   Stroke: R MCA infarct s/p tPA and evt w/ complete revascularization of R M1 following angioplasty w/ stent placement, infarct secondary to large vessel disease  Code Stroke CT head No acute abnormality. Numerous chronic lacunes B basal ganglia, R thalamic, white matter. ASPECTS 10.     CTA head R M1 occlusion. Intracranial B ICA moderate stenosis. B P2 w/ moderate stenoses.  CTA neck proximal L ICA 40-50% stenosis. R ICA bifurcation & proximal w/ mixed plaque. Mild to moderate distal cervical R ICA. L VA origin moderate stenosis.  Cerebral angio R M1 occlusion s/p EVT and R M1 stent placed w/ complete revascularization     CT head 7/12 1715 developing R MCA infarct. Mild hyperdensity R putamen, ? Contrast vs hemorrhage.   MRI  Evolving R large MCA infarct (basal ganglia, insula, temporal, frontal lobes). Minimal petechial hemorrhage.   MRA  interval R M1 stent patent  2D Echo EF 50-55%  TG 550, direct LDL 108.2   HgbA1c 13.5  VTE prophylaxis - lovenox  No antithrombotic  prior to admission, treated w/ cangrelor post IR, loaded w/ Brilinta 180 and aspirin post op, now on aspirin 81  mg daily and Brilinta (ticagrelor) 90 mg bid.   Therapy recommendations:  CIR   Disposition:  pending   Hypertensive Emergency  BP as high as 260/140 on admission   Home meds:  None  Treated w max Cleviprex post IR for Post IR tight BP control -> wean off as able  now SBP goal < 180/105  On amlodipine 10  On hydralazine PRN . Long-term BP goal normotensive  Hyperlipidemia  Home meds:  None  Now on lipitor 80  TG 550, direct LDL 108.2, goal < 70  Continue statin at discharge  Diabetes type II Uncontrolled w/ Hyperglycemia  Home meds:  none  HgbA1c 13.5, goal < 7.0  CBGs  SSI 0-9 q4h  DB RN recommends add levemir 12u bid (added).   Will follow for d/c recommendations.  Dysphagia . Secondary to stroke . NPO . NG placed for meds . Speech on board . Will likely need change to cortrak in am and start TF   Tobacco abuse  Current smoker  Smoking cessation counseling will be provided  Nicotine patch 14 mg daily added  Pt is willing to quit    Alcohol abuse  Heavy user as per sister  alcohol level <10  CIWA protocol added  advised to drink no more than 2 drink(s) a day  Close monitor for alcohol withdraw  Cocaine abuse  Pt admitted using cocaine  Cessation education provided  Pt is willing to quit  Avoid beta blocker at this time  Other Stroke Risk Factors  Advanced age  Obesity, Body mass index is 32.36 kg/m., recommend weight loss, diet and exercise as appropriate   Other Active Problems  Hypokalemia, K 3.4->3.3 - supplemented - pending (phosphorus and magnesium normal)   AKI 1.69->1.60 - on IVF  Hospital day # 1  This patient is critically ill due to right MCA infarct s/p tPA and EVT, hypertensive emergency, uncontrolled hyperglycemia, cocaine use, alcohol abuse and at significant risk of neurological worsening, death form recurrent stroke, hemorrhagic conversion, DM, hypertensive encephalopathy, DT, cocaine withdraw,  cerebral edema and brain herniation. This patient's care requires constant monitoring of vital signs, hemodynamics, respiratory and cardiac monitoring, review of multiple databases, neurological assessment, discussion with family, other specialists and medical decision making of high complexity. I spent 40 minutes of neurocritical care time in the care of this patient. I had long discussion with sister and brother in law at bedside, updated pt current condition, treatment plan and potential prognosis, and answered all the questions. They expressed understanding and appreciation.   Rosalin Hawking, MD PhD Stroke Neurology 04/06/2020 7:28 PM   To contact Stroke Continuity provider, please refer to [email or web address] After hours, contact General Neurology

## 2020-04-06 NOTE — Progress Notes (Signed)
SLP Cancellation Note  [name] MRN: [id number] DOB: June 06, 1951   Cancelled treatment:       Reason Eval/Treat Not Completed: Fatigue/lethargy limiting ability to participate. Pt currently somnolent following Ativan given this morning. Consulted with RN. Will continue efforts to evaluate for po readiness, however, pt does have NGT in place if pt is not appropriate for evaluation or po intake today.  Nawaal Alling B. [name], [facility], [place] Speech Language Pathologist Office: [phone] Pager: [phone]  Shonna Chock 04/06/2020, 12:27 PM

## 2020-04-06 NOTE — Evaluation (Addendum)
Physical Therapy Evaluation [name] MRN: [id number] DOB: August 14, 1951 Today's Date: 04/06/2020   History of Present Illness  Patient is a 69 y/o male who presents with acute CVA s/p cerebral arteriogram with emergent mechanical thrombectomy of right MCA M1 occlusion along with stent assisted angioplasty of right MCA M1 04/05/20. s/p tpA. + cocaine. 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 No PMH.  Clinical Impression  Patient presents with decreased arousal, impaired attention, left hemiplegia, impaired sensation, impaired balance and impaired mobility s/p above. Pt requires max- total A for bed mobility and sitting balance at this time due to poor arousal. Given ativan earlier this AM for MRI and still having effects of this. Following a few 1 step commands but not able to sustain attention. Not able to open eyes. Responds some verbally but needs constant stimulus to sustain attention to task, focused attention at best. Withdraws from noxious stimulus LUE/LE. Pt likely independent as he was driving and working PTA per notes. Will need to get further info regarding home setup/support next session when pt more alert. Would benefit from CIR to maximize independence and mobility prior to return home. Will follow acutely.    Follow Up Recommendations CIR;Supervision for mobility/OOB;Supervision/Assistance - 24 hour    Equipment Recommendations  Other (comment) (TBA)    Recommendations for Other Services       Precautions / Restrictions Precautions Precautions: Fall Precaution Comments: NG tube; SBP <180 Restrictions Weight Bearing Restrictions: No      Mobility  Bed Mobility Overal bed mobility: Needs Assistance Bed Mobility: Supine to Sit;Sit to Supine     Supine to sit: Max assist;+2 for physical assistance;HOB elevated Sit to supine: Max assist;+2 for physical  assistance   General bed mobility comments: Assist to bring LEs to EOB, elevate trunk and scoot bottom to EOB. Little initiation of movement, when moving RLE off bed, pt raising it back onto bed  Transfers                 General transfer comment: Deferred due to safety/arousal.  Ambulation/Gait                Stairs            Wheelchair Mobility    Modified Rankin (Stroke Patients Only) Modified Rankin (Stroke Patients Only) Pre-Morbid Rankin Score: No symptoms Modified Rankin: Severe disability     Balance Overall balance assessment: Needs assistance Sitting-balance support: Feet supported;No upper extremity supported Sitting balance-Leahy Scale: Zero Sitting balance - Comments: total A for sitting balance with pt pushing posteriorly at times. No balance reactions noted. Postural control: Posterior lean                                   Pertinent Vitals/Pain Pain Assessment: Faces Faces Pain Scale: No hurt    Home Living Family/patient expects to be discharged to:: Private residence                 Additional Comments: Not able to gather home setup due to pt's poor arousal and attention. Will obtain next session.    Prior Function Level of Independence: Independent         Comments: Driving and working PTA.     Hand Dominance   Dominant Hand: Right    Extremity/Trunk Assessment   Upper Extremity Assessment Upper Extremity Assessment:  Defer to OT evaluation;LUE deficits/detail LUE Deficits / Details: Flaccid LUE, some spontaneous movement noted with yawning. Flexor withdrawal to noxious stimulus.    Lower Extremity Assessment Lower Extremity Assessment: LLE deficits/detail LLE Deficits / Details: Flaccidity LLE; withdrawal to noxious stimulus. LLE Sensation: decreased light touch LLE Coordination: decreased fine motor;decreased gross motor       Communication   Communication: Expressive difficulties  (dysarthria)  Cognition Arousal/Alertness: Lethargic;Suspect due to medications Behavior During Therapy: Flat affect Overall Cognitive Status: Difficult to assess Area of Impairment: Attention;Following commands                   Current Attention Level: Focused   Following Commands: Follows one step commands with increased time (repetition and tactile cues due to poor attention)       General Comments: Given ativan prior to MRI this Am and still very lethargic and sleepy. A&Ox4. Without stimulus, pt falling back asleep. Used cold wash cloth, sternal rub, verbal cues and sitting upright but not able to sustain arousal to participate. Not able to open eyes, despite trying.      General Comments General comments (skin integrity, edema, etc.): VSS on RA.    Exercises     Assessment/Plan    PT Assessment Patient needs continued PT services  PT Problem List Decreased strength;Decreased mobility;Decreased safety awareness;Impaired tone;Decreased range of motion;Decreased coordination;Decreased activity tolerance;Decreased cognition;Decreased balance;Impaired sensation;Decreased knowledge of use of DME       PT Treatment Interventions Therapeutic activities;Gait training;Therapeutic exercise;Patient/family education;Balance training;Functional mobility training;Neuromuscular re-education;Wheelchair mobility training    PT Goals (Current goals can be found in the Care Plan section)  Acute Rehab PT Goals Patient Stated Goal: too lethargic to state PT Goal Formulation: Patient unable to participate in goal setting Time For Goal Achievement: 04/20/20 Potential to Achieve Goals: Fair    Frequency Min 4X/week   Barriers to discharge   unsure of above    Co-evaluation               AM-PAC PT "6 Clicks" Mobility  Outcome Measure Help needed turning from your back to your side while in a flat bed without using bedrails?: Total Help needed moving from lying on your back  to sitting on the side of a flat bed without using bedrails?: Total Help needed moving to and from a bed to a chair (including a wheelchair)?: Total Help needed standing up from a chair using your arms (e.g., wheelchair or bedside chair)?: Total Help needed to walk in hospital room?: Total Help needed climbing 3-5 steps with a railing? : Total 6 Click Score: 6    End of Session   Activity Tolerance: Patient limited by lethargy Patient left: in bed;with call bell/phone within reach;with bed alarm set;with nursing/sitter in room Nurse Communication: Mobility status;Need for lift equipment PT Visit Diagnosis: Hemiplegia and hemiparesis;Difficulty in walking, not elsewhere classified (R26.2);Unsteadiness on feet (R26.81) Hemiplegia - Right/Left: Left Hemiplegia - dominant/non-dominant: Non-dominant Hemiplegia - caused by: Cerebral infarction    Time: 3846-6599 PT Time Calculation (min) (ACUTE ONLY): 15 min   Charges:   PT Evaluation $PT Eval Moderate Complexity: 1 Mod          Marisa Severin, PT, DPT Acute Rehabilitation Services Pager [phone] Office [phone]      [place] 04/06/2020, 3:43 PM

## 2020-04-06 NOTE — Progress Notes (Signed)
Updated Neuro MD Lindzen about patient's blood pressure. Cleviprex drip at new max dose of 30 mg/hour. Patient's blood pressure via arterial line with an appropriate waveform continues to hang out above goal of systolic blood pressure of 120-140. This RN was given a verbal order to go by cuff pressure. Will continue to monitor.

## 2020-04-06 NOTE — Progress Notes (Signed)
Rehab Admissions Coordinator Note:  Per PT recommendation, this patient was screened by Raechel Ache for appropriateness for an Inpatient Acute Rehab Consult.  At this time, we will follow for additional treatment sessions and watch for greater tolerance with therapies prior to requesting IP Rehab consult.   Raechel Ache 04/06/2020, 3:55 PM  I can be reached at [phone].

## 2020-04-06 NOTE — Progress Notes (Signed)
Referring Physician(s): Code stroke- Rory Percy, [place]  Supervising Physician: Pedro Earls  Patient Status:  Rodney Estrada - In-pt  Chief Complaint: None- drowsy  Subjective:  History of acute CVA s/p cerebral arteriogram with emergent mechanical thrombectomy of right MCA M1 occlusion along with stent assisted angioplasty of right MCA M1 underlying stenosis via right femoral approach 04/05/2020 by Dr. Karenann Cai. Patient laying in bed resting comfortably. Appears drowsy- opens eyes to voice and follows some simple commands but quickly falls back asleep. Moving right side, left side with minimal flicker from pain. Speech dysarthric. Left facial droop. Right gaze preference. Right groin puncture site c/d/i.   Allergies: Patient has no known allergies.  Medications: Prior to Admission medications   Not on File     Vital Signs: BP (!) 126/111   Pulse 93   Temp 98.4 F (36.9 C) (Oral)   Resp (!) 27   Ht 5\' 9"  (1.753 m)   Wt 219 lb 2.2 oz (99.4 kg)   SpO2 96%   BMI 32.36 kg/m   Physical Exam Vitals and nursing note reviewed.  Constitutional:      General: He is not in acute distress. Pulmonary:     Effort: Pulmonary effort is normal. No respiratory distress.  Skin:    General: Skin is warm and dry.  Neurological:     Comments: Appears drowsy- opens eyes to voice and follows some simple commands but quickly falls back asleep. Alert to month and person but not place or date. Speech dysarthric. PERRL bilaterally. Right gaze preference. Left facial droop. Tongue midline. Moving right side, left side with minimal flicker from pain. Distal pulses (DPs) 1+ bilaterally.     Imaging: CT HEAD WO CONTRAST  Result Date: 04/05/2020 CLINICAL DATA:  Stroke. Thrombectomy and stenting right MCA today. Patient received tPA. EXAM: CT HEAD WITHOUT CONTRAST TECHNIQUE: Contiguous axial images were obtained from the base of the skull through the vertex without  intravenous contrast. COMPARISON:  CT head earlier today FINDINGS: Brain: Interval development of low-density in the right MCA territory compatible with acute infarct. This involves the insula, lateral temporal lobe, right frontal lobe and right basal ganglia. There is mild hyperdensity in the right putamen which may represent contrast staining or slight hemorrhage. Otherwise no acute hemorrhage. Generalized atrophy without hydrocephalus. 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 Vascular: Interval stenting right MCA . No hyperdense vessel. Contrast enhanced vessels due to earlier angiography. Skull: Negative Sinuses/Orbits: Mild mucosal edema paranasal sinuses. No orbital lesion. Other: None IMPRESSION: Right MCA thrombectomy and stenting earlier today. Developing right MCA infarct. Mild hyperdensity in the right putamen which may represent contrast staining from acute infarct versus mild hemorrhage. These results were called by telephone at the time of interpretation on 04/05/2020 at 5:14 pm to provider [place] , who verbally acknowledged these results. Electronically Signed   By: Franchot Gallo M.D.   On: 04/05/2020 17:15   CT C-SPINE NO CHARGE  Result Date: 04/05/2020 CLINICAL DATA:  Stroke. EXAM: CT CERVICAL SPINE WITHOUT CONTRAST TECHNIQUE: Multidetector CT imaging of the cervical spine was performed without intravenous contrast. Multiplanar CT image reconstructions were also generated. COMPARISON:  Open FINDINGS: Alignment: Cervical dextrocurvature. Straightening of the expected cervical lordosis. No significant spondylolisthesis. Skull base and vertebrae: The basion-dental and atlanto-dental intervals are maintained.No evidence of acute fracture to the cervical spine. Well-corticated fragment adjacent to the T1 spinous process, likely chronic Soft tissues and spinal canal: No prevertebral fluid  or swelling. No visible canal hematoma. Nonspecific 11 mm cystic  appearing subcutaneous lesion within the mid right neck (series 18, image 66). Disc levels: Cervical spondylosis. Most notably at C5-C6 and C6-C7, there is advanced disc space narrowing with posterior disc osteophytes as well as uncovertebral and facet hypertrophy. Bilateral neural foraminal narrowing with suspected at least moderate spinal canal stenosis at C5-C6. Bilateral neural foraminal narrowing with suspected moderate/severe spinal canal stenosis at C6-C7 Upper chest: No consolidation within the imaged lung apices. No visible pneumothorax. IMPRESSION: No evidence of acute fracture to the cervical spine. Cervical spondylosis as described and greatest at C5-C6 and C6-C7. Bilateral neural foraminal narrowing with suspected moderate/severe spinal canal stenosis at C6-C7. Bilateral neural foraminal narrowing with suspected at least moderate spinal canal stenosis at C5-C6 Cervical dextrocurvature. Nonspecific cystic appearing 11 mm subcutaneous lesion within the mid right neck. Electronically Signed   By: Kellie Simmering DO   On: 04/05/2020 11:59   DG Abd Portable 1V  Result Date: 04/05/2020 CLINICAL DATA:  69 year old male status post feeding tube placement. EXAM: PORTABLE ABDOMEN - 1 VIEW COMPARISON:  None. FINDINGS: Partially visualized enteric tube with side port and tip in the left upper abdomen likely in the proximal stomach. No bowel dilatation. There is severe dilatation of the left renal collecting system containing excreted contrast. There is atherosclerotic calcification of the aorta. The osseous structures are intact. IMPRESSION: 1. Enteric tube with tip in the proximal stomach. 2. Severe left hydronephrosis. Electronically Signed   By: Anner Crete M.D.   On: 04/05/2020 19:42   CT HEAD CODE STROKE WO CONTRAST  Addendum Date: 04/05/2020   ADDENDUM REPORT: 04/05/2020 11:14 ADDENDUM: These results were called by telephone at the time of interpretation on 04/05/2020 at 11:14 am to provider Dr.  Rory Percy, who verbally acknowledged these results. Electronically Signed   By: Kellie Simmering DO   On: 04/05/2020 11:14   Result Date: 04/05/2020 CLINICAL DATA:  Code stroke. Neuro deficit, acute, stroke suspected. EXAM: CT HEAD WITHOUT CONTRAST TECHNIQUE: Contiguous axial images were obtained from the base of the skull through the vertex without intravenous contrast. COMPARISON:  No pertinent prior studies available for comparison. FINDINGS: Brain: The examination is mildly motion degraded. Mild generalized parenchymal atrophy. There is no acute intracranial hemorrhage. No acute demarcated cortical infarct is identified. There are numerous small lacunar infarcts within the bilateral cerebral white matter, right thalamus and bilateral basal ganglia which appear chronic. Background mild patchy hypoattenuation within the cerebral white matter which is nonspecific, but consistent with chronic small vessel ischemic disease. No extra-axial fluid collection. No evidence of intracranial mass. No midline shift. Vascular: No hyperdense vessel.  Atherosclerotic calcifications Skull: Normal. Negative for fracture or focal lesion. Sinuses/Orbits: Visualized orbits show no acute finding. Small right maxillary sinus mucous retention cyst. Mild ethmoid sinus mucosal thickening. No significant mastoid effusion. ASPECTS [facility] Stroke Program Early CT Score) - Ganglionic level infarction (caudate, lentiform nuclei, internal capsule, insula, M1-M3 cortex): 7 - Supraganglionic infarction (M4-M6 cortex): 3 Total score (0-10 with 10 being normal): 10 IMPRESSION: 1. Mildly motion degraded examination. 2. No CT evidence of acute infarct or acute intracranial hemorrhage. 3. Numerous small chronic appearing lacunar infarcts within the cerebral white matter, bilateral basal ganglia and right thalamus. Background mild chronic small vessel ischemic changes within the cerebral white matter. 4. Mild generalized parenchymal atrophy. 5. Mild ethmoid  sinus mucosal thickening. Small right maxillary sinus mucous retention cyst. Electronically Signed: By: Kellie Simmering DO On: 04/05/2020 11:11  CT ANGIO HEAD CODE STROKE  Result Date: 04/05/2020 CLINICAL DATA:  Stroke, follow-up. EXAM: CT ANGIOGRAPHY HEAD AND NECK TECHNIQUE: Multidetector CT imaging of the head and neck was performed using the standard protocol during bolus administration of intravenous contrast. Multiplanar CT image reconstructions and MIPs were obtained to evaluate the vascular anatomy. Carotid stenosis measurements (when applicable) are obtained utilizing NASCET criteria, using the distal internal carotid diameter as the denominator. CONTRAST:  Administered contrast not known at this time. COMPARISON:  Concurrently performed noncontrast head CT. FINDINGS: CTA NECK FINDINGS Aortic arch: Standard aortic branching. Atherosclerotic plaque within the visualized aortic arch and proximal major branch vessels of the neck. No hemodynamically significant innominate or proximal subclavian artery stenosis. Right carotid system: CCA and ICA patent within the neck without significant stenosis (50% or greater). Moderate mixed plaque within the carotid bifurcation and proximal ICA. Mild to moderate mixed plaque is also present within the distal cervical ICA Left carotid system: CCA and ICA patent within the neck. Moderate mixed plaque within the carotid bifurcation and proximal ICA. Narrowing of the proximal ICA of 40-50% Vertebral arteries: The dominant right vertebral artery is patent within the neck without significant stenosis (50% or greater). The non dominant left vertebral artery is patent within the neck. Moderate atherosclerotic narrowing at the origin of this vessel Skeleton: No acute bony abnormality or aggressive osseous lesion. Cervical spondylosis with multilevel disc space narrowing, posterior disc osteophytes, uncovertebral and facet hypertrophy. Spondylosis is greatest at C5-C6 and C6-C7  Other neck: Thyroid unremarkable. Nonspecific subcutaneous cystic appearing lesion within the right mid neck measuring 10 mm (series 100, image 411). Upper chest: No consolidation within the imaged lung apices Review of the MIP images confirms the above findings CTA HEAD FINDINGS Anterior circulation: The intracranial internal carotid arteries are patent. Atherosclerotic plaque within both vessels. Mild to moderate narrowing of the pre cavernous right ICA. 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 Some reconstitution of flow is seen within M2 and more distal right MCA branch vessels, although asymmetrically decreased as compared to the left. The M1 left middle cerebral artery is patent. No left M2 proximal branch occlusion or high-grade proximal stenosis is identified. The anterior cerebral arteries are patent. No intracranial aneurysm is identified. Posterior circulation: The intracranial vertebral arteries are patent without significant stenosis. Mild nonstenotic plaque within the V4 right vertebral artery. The basilar artery is patent without significant stenosis. The posterior cerebral arteries are patent. There is a sizable right posterior communicating artery. The left posterior communicating artery is hypoplastic or absent. Moderate stenosis within the proximal P2 right PCA. Moderate stenosis within the mid P2 left PCA. Venous sinuses: Within limitations of contrast timing, no convincing thrombus. 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 These results were called by telephone at the time of interpretation on 04/05/2020 at 11:14 am to provider Dr. Rory Percy, who verbally acknowledged these results. IMPRESSION: CTA neck: 1. The left common and internal carotid arteries are patent within the  neck. 40-50% atherosclerotic narrowing of the proximal left ICA. 2. The right common and internal carotid arteries are patent within the neck without significant stenosis. Moderate mixed plaque within the right carotid bifurcation and proximal ICA. Mild to moderate mixed plaque within the distal cervical ICA. 3. The vertebral arteries are patent within the neck bilaterally with  the right being dominant. Moderate stenosis at the origin of the non dominant left vertebral artery. CTA head: 1. 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 There is some reconstitution of flow within M2 and more distal right MCA branch vessels, although asymmetrically decreased as compared to the left. 2. Sites of up to moderate stenosis within both intracranial internal carotid arteries. 3. Moderate stenoses within the P2 posterior cerebral arteries bilaterally. Electronically Signed   By: Kellie Simmering DO   On: 04/05/2020 11:44   CT ANGIO NECK CODE STROKE  Result Date: 04/05/2020 CLINICAL DATA:  Stroke, follow-up. EXAM: CT ANGIOGRAPHY HEAD AND NECK TECHNIQUE: Multidetector CT imaging of the head and neck was performed using the standard protocol during bolus administration of intravenous contrast. Multiplanar CT image reconstructions and MIPs were obtained to evaluate the vascular anatomy. Carotid stenosis measurements (when applicable) are obtained utilizing NASCET criteria, using the distal internal carotid diameter as the denominator. CONTRAST:  Administered contrast not known at this time. COMPARISON:  Concurrently performed noncontrast head CT. FINDINGS: CTA NECK FINDINGS Aortic arch: Standard aortic branching. Atherosclerotic plaque within the visualized aortic arch and proximal major branch vessels of the neck. No hemodynamically significant innominate or proximal subclavian artery stenosis. Right carotid system: CCA and ICA patent within the neck without significant stenosis (50% or greater). Moderate  mixed plaque within the carotid bifurcation and proximal ICA. Mild to moderate mixed plaque is also present within the distal cervical ICA Left carotid system: CCA and ICA patent within the neck. Moderate mixed plaque within the carotid bifurcation and proximal ICA. Narrowing of the proximal ICA of 40-50% Vertebral arteries: The dominant right vertebral artery is patent within the neck without significant stenosis (50% or greater). The non dominant left vertebral artery is patent within the neck. Moderate atherosclerotic narrowing at the origin of this vessel Skeleton: No acute bony abnormality or aggressive osseous lesion. Cervical spondylosis with multilevel disc space narrowing, posterior disc osteophytes, uncovertebral and facet hypertrophy. Spondylosis is greatest at C5-C6 and C6-C7 Other neck: Thyroid unremarkable. Nonspecific subcutaneous cystic appearing lesion within the right mid neck measuring 10 mm (series 100, image 411). Upper chest: No consolidation within the imaged lung apices Review of the MIP images confirms the above findings CTA HEAD FINDINGS Anterior circulation: The intracranial internal carotid arteries are patent. Atherosclerotic plaque within both vessels. Mild to moderate narrowing of the pre cavernous right ICA. 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 Some reconstitution of flow is seen within M2 and more distal right MCA branch vessels, although asymmetrically decreased as compared to the left. The M1 left middle cerebral artery is patent. No left M2 proximal branch occlusion or high-grade proximal stenosis is identified. The anterior cerebral arteries are patent. No intracranial aneurysm is identified. Posterior circulation: The intracranial vertebral arteries are patent without significant stenosis. Mild nonstenotic plaque within the V4 right vertebral artery.  The basilar artery is patent without significant stenosis. The posterior cerebral arteries are patent. There is a sizable right posterior communicating artery. The left posterior communicating artery is hypoplastic or absent. Moderate stenosis within the proximal P2 right PCA. Moderate stenosis within the mid P2 left PCA. Venous sinuses: Within limitations of contrast timing, no convincing thrombus. 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 These results were called by telephone  at the time of interpretation on 04/05/2020 at 11:14 am to provider Dr. Rory Percy, who verbally acknowledged these results. IMPRESSION: CTA neck: 1. The left common and internal carotid arteries are patent within the neck. 40-50% atherosclerotic narrowing of the proximal left ICA. 2. The right common and internal carotid arteries are patent within the neck without significant stenosis. Moderate mixed plaque within the right carotid bifurcation and proximal ICA. Mild to moderate mixed plaque within the distal cervical ICA. 3. The vertebral arteries are patent within the neck bilaterally with the right being dominant. Moderate stenosis at the origin of the non dominant left vertebral artery. CTA head: 1. 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 There is some reconstitution of flow within M2 and more distal right MCA branch vessels, although asymmetrically decreased as compared to the left. 2. Sites of up to moderate stenosis within both intracranial internal carotid arteries. 3. Moderate stenoses within the P2 posterior cerebral arteries bilaterally. Electronically Signed   By: Kellie Simmering DO   On: 04/05/2020 11:44    Labs:  CBC: Recent Labs    04/05/20 1049 04/05/20 1056  WBC 9.0  --   HGB 16.0 16.3  HCT 46.7 48.0  PLT 205  --     COAGS: Recent Labs    04/05/20 1049  INR 1.0  APTT 29     BMP: Recent Labs    04/05/20 1049 04/05/20 1056  NA 136 137  K 3.4* 3.3*  CL 99 99  CO2 21*  --   GLUCOSE 351* 356*  BUN 13 15  CALCIUM 9.5  --   CREATININE 1.69* 1.60*  GFRNONAA 41*  --   GFRAA 48*  --     LIVER FUNCTION TESTS: Recent Labs    04/05/20 1049  BILITOT 0.8  AST 39  ALT 38  ALKPHOS 80  PROT 7.0  ALBUMIN 3.6    Assessment and Plan:  History of acute CVA s/p cerebral arteriogram with emergent mechanical thrombectomy of right MCA M1 occlusion along with stent assisted angioplasty of right MCA M1 underlying stenosis via right femoral approach 04/05/2020 by Dr. Karenann Cai. Patient drowsy (opens eyes to voice and follows some simple commands but quickly falls back asleep), moving right side, left side with minimal flicker from pain, speech dysarthric, left facial droop, right gaze preference. Right groin puncture site stable, distal pulses (DPs) 1+ bilaterally. Continue taking Brilinta 90 mg twice daily and Aspirin 81 mg once daily. Plan to follow-up with Dr. Karenann Cai in clinic 4 weeks after discharge- order placed to facilitate this. Further plans per neurology- appreciate and agree with management. NIR to follow.   Electronically Signed: Earley Abide, PA-C 04/06/2020, 9:30 AM   I spent a total of 25 Minutes at the the patient's bedside AND on the patient's hospital floor or unit, greater than 50% of which was counseling/coordinating care for CVA s/p revascularization.

## 2020-04-06 NOTE — Progress Notes (Signed)
SLP Cancellation Note  [name] MRN: [id number] DOB: 1951/04/25   Cancelled treatment:       Reason Eval/Treat Not Completed: Other (comment) Attempted to see pt x2 this morning. Initially, he was going to MRI. Pt also received ativan to go to MRI so upon return, he is not alert enough for swallow evaluation. Discussed with RN and will continue efforts to see this pt as soon as able.   Osie Bond., M.A. [facility] Pager [phone] Office [phone]  04/06/2020, 10:38 AM

## 2020-04-07 DIAGNOSIS — E7800 Pure hypercholesterolemia, unspecified: Secondary | ICD-10-CM

## 2020-04-07 DIAGNOSIS — F1010 Alcohol abuse, uncomplicated: Secondary | ICD-10-CM

## 2020-04-07 LAB — GLUCOSE, CAPILLARY
Glucose-Capillary: 148 mg/dL — ABNORMAL HIGH (ref 70–99)
Glucose-Capillary: 136 mg/dL — ABNORMAL HIGH (ref 70–99)
Glucose-Capillary: 125 mg/dL — ABNORMAL HIGH (ref 70–99)
Glucose-Capillary: 172 mg/dL — ABNORMAL HIGH (ref 70–99)
Glucose-Capillary: 181 mg/dL — ABNORMAL HIGH (ref 70–99)
Glucose-Capillary: 183 mg/dL — ABNORMAL HIGH (ref 70–99)

## 2020-04-07 LAB — TRIGLYCERIDES: Triglycerides: 261 mg/dL — ABNORMAL HIGH (ref <150)

## 2020-04-07 LAB — BASIC METABOLIC PANEL
Anion gap: 13 (ref 5–15)
BUN: 7 mg/dL — ABNORMAL LOW (ref 8–23)
CO2: 23 mmol/L (ref 22–32)
Calcium: 8.5 mg/dL — ABNORMAL LOW (ref 8.9–10.3)
Chloride: 106 mmol/L (ref 98–111)
Creatinine, Ser: 1.12 mg/dL (ref 0.61–1.24)
GFR calc Af Amer: >60 mL/min (ref >60)
GFR calc non Af Amer: >60 mL/min (ref >60)
Glucose, Bld: 136 mg/dL — ABNORMAL HIGH (ref 70–99)
Potassium: 3 mmol/L — ABNORMAL LOW (ref 3.5–5.1)
Sodium: 142 mmol/L (ref 135–145)

## 2020-04-07 LAB — CBC
HCT: 39.9 % (ref 39.0–52.0)
Hemoglobin: 13.1 g/dL (ref 13.0–17.0)
MCH: 28.5 pg (ref 26.0–34.0)
MCHC: 32.8 g/dL (ref 30.0–36.0)
MCV: 86.9 fL (ref 80.0–100.0)
Platelets: 174 10*3/uL (ref 150–400)
RBC: 4.59 MIL/uL (ref 4.22–5.81)
RDW: 14.1 % (ref 11.5–15.5)
WBC: 9.3 10*3/uL (ref 4.0–10.5)
nRBC: 0 % (ref 0.0–0.2)

## 2020-04-07 LAB — MAGNESIUM
Magnesium: 1.4 mg/dL — ABNORMAL LOW (ref 1.7–2.4)
Magnesium: 2.1 mg/dL (ref 1.7–2.4)

## 2020-04-07 LAB — PHOSPHORUS
Phosphorus: 2.9 mg/dL (ref 2.5–4.6)
Phosphorus: 2.5 mg/dL (ref 2.5–4.6)

## 2020-04-07 MED ORDER — PROSOURCE TF PO LIQD
45.0000 mL | Freq: Two times a day (BID) | ORAL | Status: DC
  Administered 2020-04-07: 45 mL
  Filled 2020-04-07 (×2): qty 45

## 2020-04-07 MED ORDER — VITAL HIGH PROTEIN PO LIQD: 1000.0000 mL | ORAL | Status: DC

## 2020-04-07 MED ORDER — JEVITY 1.2 CAL PO LIQD
1000.0000 mL | ORAL | Status: DC
  Administered 2020-04-10 – 2020-04-13 (×4): 1000 mL
  Filled 2020-04-07 (×14): qty 1000

## 2020-04-07 MED ORDER — JEVITY 1.2 CAL PO LIQD
1000.0000 mL | ORAL | Status: DC
  Administered 2020-04-07: 1000 mL
  Filled 2020-04-07: qty 1000

## 2020-04-07 MED ORDER — POTASSIUM CHLORIDE 20 MEQ PO PACK
40.0000 meq | PACK | ORAL | Status: AC
  Administered 2020-04-07 (×3): 40 meq
  Filled 2020-04-07 (×3): qty 2

## 2020-04-07 MED ORDER — MAGNESIUM SULFATE 2 GM/50ML IV SOLN
2.0000 g | Freq: Once | INTRAVENOUS | Status: AC
  Administered 2020-04-07: 2 g via INTRAVENOUS
  Filled 2020-04-07: qty 50

## 2020-04-07 MED ORDER — PROSOURCE TF PO LIQD
45.0000 mL | Freq: Three times a day (TID) | ORAL | Status: DC
  Administered 2020-04-07 – 2020-04-14 (×20): 45 mL
  Filled 2020-04-07 (×20): qty 45

## 2020-04-07 MED ORDER — ADULT MULTIVITAMIN W/MINERALS CH
1.0000 | ORAL_TABLET | Freq: Every day | ORAL | Status: DC
  Administered 2020-04-08 – 2020-04-14 (×7): 1
  Filled 2020-04-07 (×7): qty 1

## 2020-04-07 NOTE — Progress Notes (Signed)
Attempted to contact family to inform them patient has moved to 3W room 71, but this RN did not see any contact information on file.

## 2020-04-07 NOTE — Progress Notes (Signed)
Referring Physician(s): Code stroke- Rory Percy, [place]  Supervising Physician: Pedro Earls  Patient Status:  [facility] - In-pt  Chief Complaint: None- drowsy  Subjective:  History of acute CVA s/p cerebral arteriogram with emergent mechanical thrombectomy of right MCA M1 occlusion along with stent assisted angioplasty of right MCA M1 underlying stenosis via right femoral approach 04/05/2020 by Dr. Karenann Cai. Patient laying in bed resting comfortably. Appears drowsy- opens eyes to voice and follows some simple commands but quickly falls back asleep. On CIWA protocol- Ativan given this AM. Moving right side, left side with minimal flicker from pain. Speech dysarthric. Left facial droop. Right gaze preference. Right groin puncture site c/d/i.   Allergies: Patient has no known allergies.  Medications: Prior to Admission medications   Not on File     Vital Signs: BP (!) 155/115 (BP Location: Right Arm)   Pulse 87   Temp 98.9 F (37.2 C) (Axillary)   Resp 12   Ht '5\' 9"'  (1.753 m)   Wt 219 lb 2.2 oz (99.4 kg)   SpO2 95%   BMI 32.36 kg/m   Physical Exam Vitals and nursing note reviewed.  Constitutional:      General: He is not in acute distress.    Comments: Drowsy.  Pulmonary:     Effort: Pulmonary effort is normal. No respiratory distress.  Skin:    General: Skin is warm and dry.     Comments: Right groin puncture site soft without active bleeding or hematoma.  Neurological:     Comments: Appears drowsy- opens eyes to voice and follows some simple commands but quickly falls back asleep. Speech dysarthric. PERRL bilaterally. Right gaze preference. Left facial droop- improved since yesterday. Moving right side, left side with minimal flicker from pain. Distal pulses (DPs) 1+ bilaterally.      Imaging: CT HEAD WO CONTRAST  Result Date: 04/05/2020 CLINICAL DATA:  Stroke. Thrombectomy and stenting right MCA today. Patient received tPA. EXAM:  CT HEAD WITHOUT CONTRAST TECHNIQUE: Contiguous axial images were obtained from the base of the skull through the vertex without intravenous contrast. COMPARISON:  CT head earlier today FINDINGS: Brain: Interval development of low-density in the right MCA territory compatible with acute infarct. This involves the insula, lateral temporal lobe, right frontal lobe and right basal ganglia. There is mild hyperdensity in the right putamen which may represent contrast staining or slight hemorrhage. Otherwise no acute hemorrhage. Generalized atrophy without hydrocephalus. 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 Vascular: Interval stenting right MCA . No hyperdense vessel. Contrast enhanced vessels due to earlier angiography. Skull: Negative Sinuses/Orbits: Mild mucosal edema paranasal sinuses. No orbital lesion. Other: None IMPRESSION: Right MCA thrombectomy and stenting earlier today. Developing right MCA infarct. Mild hyperdensity in the right putamen which may represent contrast staining from acute infarct versus mild hemorrhage. These results were called by telephone at the time of interpretation on 04/05/2020 at 5:14 pm to provider [place] , who verbally acknowledged these results. Electronically Signed   By: Franchot Gallo M.D.   On: 04/05/2020 17:15   MR ANGIO HEAD WO CONTRAST  Result Date: 04/06/2020 CLINICAL DATA:  Stroke post thrombectomy and stent placement EXAM: MRI HEAD WITHOUT CONTRAST MRA HEAD WITHOUT CONTRAST TECHNIQUE: Multiplanar, multiecho pulse sequences of the brain and surrounding structures were obtained without intravenous contrast. Angiographic images of the head were obtained using MRA technique without contrast. COMPARISON:  Prior CT imaging FINDINGS: MRI HEAD Brain: There is restricted diffusion  in the right MCA territory involving temporal, frontal, and parietal lobes as well as insula. There is some extension to the lateral right occipital lobe and  there is involvement of the basal ganglia. Minimal involvement of right ACA territory is also noted with foci of restricted diffusion in the parasagittal frontal lobe. There is minimal corresponding susceptibility noted for example along the right body of caudate and in the right parietal lobe reflecting petechial hemorrhage. There is no significant mass effect. No hydrocephalus. Prominence of ventricles and sulci reflects generalized parenchymal volume loss. There are chronic small vessel infarcts of the central white matter and deep gray nuclei bilaterally as well as the right cerebellar hemisphere. Vascular: Major vessel flow voids at the skull base are preserved. There is susceptibility along the right M1 MCA related to stent placement. Skull and upper cervical spine: Normal marrow signal is preserved. Sinuses/Orbits: Minor mucosal thickening.  Orbits are unremarkable. Other: Sella is unremarkable.  Mastoid air cells are clear. MRA HEAD Motion artifact is present with findings within this limitation. Intracranial internal carotid arteries are patent. There is loss of flow related enhancement along the mid to distal right M1 MCA secondary to susceptibility artifact from stent placement. Right middle cerebral artery otherwise appears patent. Both anterior cerebral and left middle cerebral arteries are patent. Intracranial vertebral arteries, basilar artery, posterior cerebral arteries are patent. Right posterior communicating artery is present and patent. IMPRESSION: Evolving acute right MCA territory infarction involving basal ganglia, insula, and temporal, frontal, and parietal lobes. Minimal petechial hemorrhage. No significant mass effect. Interval right M1 MCA stent placement with associated artifact appears patent. Electronically Signed   By: Macy Mis M.D.   On: 04/06/2020 11:37   MR BRAIN WO CONTRAST  Result Date: 04/06/2020 CLINICAL DATA:  Stroke post thrombectomy and stent placement EXAM: MRI  HEAD WITHOUT CONTRAST MRA HEAD WITHOUT CONTRAST TECHNIQUE: Multiplanar, multiecho pulse sequences of the brain and surrounding structures were obtained without intravenous contrast. Angiographic images of the head were obtained using MRA technique without contrast. COMPARISON:  Prior CT imaging FINDINGS: MRI HEAD Brain: There is restricted diffusion in the right MCA territory involving temporal, frontal, and parietal lobes as well as insula. There is some extension to the lateral right occipital lobe and there is involvement of the basal ganglia. Minimal involvement of right ACA territory is also noted with foci of restricted diffusion in the parasagittal frontal lobe. There is minimal corresponding susceptibility noted for example along the right body of caudate and in the right parietal lobe reflecting petechial hemorrhage. There is no significant mass effect. No hydrocephalus. Prominence of ventricles and sulci reflects generalized parenchymal volume loss. There are chronic small vessel infarcts of the central white matter and deep gray nuclei bilaterally as well as the right cerebellar hemisphere. Vascular: Major vessel flow voids at the skull base are preserved. There is susceptibility along the right M1 MCA related to stent placement. Skull and upper cervical spine: Normal marrow signal is preserved. Sinuses/Orbits: Minor mucosal thickening.  Orbits are unremarkable. Other: Sella is unremarkable.  Mastoid air cells are clear. MRA HEAD Motion artifact is present with findings within this limitation. Intracranial internal carotid arteries are patent. There is loss of flow related enhancement along the mid to distal right M1 MCA secondary to susceptibility artifact from stent placement. Right middle cerebral artery otherwise appears patent. Both anterior cerebral and left middle cerebral arteries are patent. Intracranial vertebral arteries, basilar artery, posterior cerebral arteries are patent. Right posterior  communicating artery is  present and patent. IMPRESSION: Evolving acute right MCA territory infarction involving basal ganglia, insula, and temporal, frontal, and parietal lobes. Minimal petechial hemorrhage. No significant mass effect. Interval right M1 MCA stent placement with associated artifact appears patent. Electronically Signed   By: Macy Mis M.D.   On: 04/06/2020 11:37   IR Intra Cran Stent  Result Date: 04/06/2020 INDICATION: 69 year old male with past medical history significant for tobacco use. He head sudden onset of right gaze deviation left-side paralysis. Systolic blood pressure was 260/149 mmHg upon arrival. His last known well was 10 a.m. on 04/05/2020. Premorbid Rankin scale was 0 and NIHSS at presentation was 19. IV tPA was started at 11:16 a.m. head CT showed no evidence of intracranial hemorrhage with remote strokes noted in the bilateral centrum semiovale, basal ganglia and right thalamus. CT angiogram of the head and neck showed for a proximal right M1/MCA occlusion. He was then transferred to our service for diagnostic cerebral angiogram and mechanical thrombectomy. EXAM: IR CT HEAD LIMITED; INTRACRANIAL STENT (INCL PTA); IR PERCUTANEOUS ART THORMBECTOMY/INFUSION INTRACRANIAL INCLUDE DIAG ANGIO COMPARISON:  CT/CT angiogram of the head and neck 04/05/2020. MEDICATIONS: IV bolus fallowed by infusion of cangrelor. ANESTHESIA/SEDATION: The procedure was performed under general anesthesia. FLUOROSCOPY TIME:  Fluoroscopy Time: 29 minutes 18 seconds (1,626 mGy). COMPLICATIONS: None immediate. TECHNIQUE: Unable to obtain informed consent as patient could not consent for himself and no next of kin or health care proxy was available. Case discussed with neuro hospitalist team and decision made that proceed with intervention was in the best interest of the patient. Maximal Sterile Barrier Technique was utilized including caps, mask, sterile gowns, sterile gloves, sterile drape, hand hygiene  and skin antiseptic. A timeout was performed prior to the initiation of the procedure. The right groin was prepped and draped in the usual sterile fashion. Using a micropuncture kit and the modified Seldinger technique, access was gained to the right common femoral artery and an 8 French sheath was placed. Under fluoroscopy, an 8 [place] Walrus balloon guide catheter was navigated over a 6 [place] Berenstein 2 catheter and a 0.035" Terumo Glidewire into the aortic arch. The catheter was placed into the right common carotid artery and then advanced into the left internal carotid artery. The inner catheter was removed. Frontal and lateral angiograms of the head were obtained. FINDINGS: 1. Increased tortuosity of the cervical right ICA. Luminal irregularity along the petrous and cavernous segment of the right ICA, consistent with atherosclerotic disease with focal area of mild stenosis at the petrous segment. 2. 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 3. Opacification of the right ACA, left anterior and posterior circulation related to prominent right posterior communicating artery and anterior communicating artery. PROCEDURE: Under biplane roadmap, a Zoom 71 aspiration catheter was navigated over a phenom 21 microcatheter and a synchro support microguidewire into the cavernous segment of the right ICA. The microcatheter was removed. The aspiration catheter was advanced to the level of occlusion and connected to a penumbra aspiration pump. Continuous aspiration was performed for maintenance. The guiding catheter balloon was inflated. The aspiration catheter was removed under constant aspiration. Follow-up right ICA angiogram showed persistent right M1 occlusion. Under biplane roadmap, a Zoom 71 aspiration catheter was navigated over a phenom 21 microcatheter and a synchro support microguidewire into the cavernous segment of the right ICA. The microcatheter was then navigated over the wire into the  right M2/MCA superior division branch. Then, a 6 x 40 mm solitaire stent retriever  was deployed spanning the right M1 and M2 segments. The device was allowed to intercalated with the clot for 4 minutes. The microcatheter was removed. The aspiration catheter was advanced to the level of occlusion and connected to a penumbra aspiration pump. The guiding catheter balloon was inflated. The thrombectomy device and aspiration catheter were removed under constant aspiration. Follow-up right ICA angiogram showed complete recanalization of the right MCA territory (TICI 3). Atherosclerotic stenosis at the mid M1 segment was noted. Follow-up angiogram showed slow flow in the right MCA territory. Flat panel CT of the head was obtained and post processed in a separate workstation with concurrent attending physician supervision. Selected images were sent to PACS. No evidence of hemorrhage is noted. Right ICA angiograms with frontal and lateral views of the head showed worsening delay of filling of the right MCA vascular tree with severe stenosis at the M1 segment. Using biplane roadmap, a 2.5 mm x 9 mm Gateway balloon was navigated into the right M1 segment over a Zoom 14 wire. Angioplasty performed under fluoroscopy. Follow-up right ICA angiogram showed significant improvement with brisk filling of the right MCA territory. Delayed follow-up showed restenoses of the M1 segment with delayed flow in the MCA territory. Patient received IV bolus of cangrelor followed by low-dose infusion. A 2.5 mm x 15 mm resolute the chronic stent was navigated into the right MCA and deployed across the stenotic M1 segment. Follow-up angiogram showed resolution of the M1 stenosis with brisk anterograde flow in the right MCA territory. Delayed follow-up angiogram showed persistent patency of the stent with no evidence of in stent platelet aggregation. No evidence of thromboembolic complication. The catheter was subsequently withdrawn. Flat panel CT  of the head was obtained and post processed in a separate workstation with concurrent attending physician supervision. Selected images were sent to PACS. No evidence of hemorrhage noted. Right common femoral artery angiograms were performed with right anterior oblique and lateral views via sheath side or contrast injection. Atherosclerotic changes of the external iliac and common femoral artery are noted without hemodynamically significant stenosis. The sheath was exchanged over the wire for an 8 [place] changes seen which was utilized for access closure. Immediate hemostasis was achieved. Incidentally noted is left-sided hydronephrosis. IMPRESSION: 1. Mechanical thrombectomy performed with 2 passes (1 pass aspiration and 1 pass combined technique) with complete recanalization of the right MCA vascular tree (TICI3). 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 2. Angioplasty performed at the stenosis M1 segment with improvement of the flow. Re-stenosis noted on follow-up angiogram. 3. Stenting of the right M1 segment performed with resolution of the M1 segment stenosis and brisk anterograde flow in the right MCA vascular tree. PLAN: 1. Continued cangrelor infusion until head CT is obtained at 4 hours post procedure. 2. Loading of dual anti-platelet in case no hemorrhage seen on follow-up head followed by dual anti-platelet therapy for 6 months. 3. Bed rest post femoral access + tPA and cangrelor. Electronically Signed   By: Pedro Earls M.D.   On: 04/06/2020 16:10   IR CT Head Ltd  Result Date: 04/06/2020 INDICATION: 69 year old male with past medical history significant for tobacco use. He head sudden onset of right gaze deviation left-side paralysis. Systolic blood pressure was 260/149 mmHg upon arrival. His last known well was 10 a.m. on 04/05/2020. Premorbid Rankin scale was 0 and NIHSS at presentation was 19. IV  tPA was started at 11:16 a.m. head CT  showed no evidence of intracranial hemorrhage with remote strokes noted in the bilateral centrum semiovale, basal ganglia and right thalamus. CT angiogram of the head and neck showed for a proximal right M1/MCA occlusion. He was then transferred to our service for diagnostic cerebral angiogram and mechanical thrombectomy. EXAM: IR CT HEAD LIMITED; INTRACRANIAL STENT (INCL PTA); IR PERCUTANEOUS ART THORMBECTOMY/INFUSION INTRACRANIAL INCLUDE DIAG ANGIO COMPARISON:  CT/CT angiogram of the head and neck 04/05/2020. MEDICATIONS: IV bolus fallowed by infusion of cangrelor. ANESTHESIA/SEDATION: The procedure was performed under general anesthesia. FLUOROSCOPY TIME:  Fluoroscopy Time: 29 minutes 18 seconds (1,626 mGy). COMPLICATIONS: None immediate. TECHNIQUE: Unable to obtain informed consent as patient could not consent for himself and no next of kin or health care proxy was available. Case discussed with neuro hospitalist team and decision made that proceed with intervention was in the best interest of the patient. Maximal Sterile Barrier Technique was utilized including caps, mask, sterile gowns, sterile gloves, sterile drape, hand hygiene and skin antiseptic. A timeout was performed prior to the initiation of the procedure. The right groin was prepped and draped in the usual sterile fashion. Using a micropuncture kit and the modified Seldinger technique, access was gained to the right common femoral artery and an 8 French sheath was placed. Under fluoroscopy, an 8 [place] Walrus balloon guide catheter was navigated over a 6 [place] Berenstein 2 catheter and a 0.035" Terumo Glidewire into the aortic arch. The catheter was placed into the right common carotid artery and then advanced into the left internal carotid artery. The inner catheter was removed. Frontal and lateral angiograms of the head were obtained. FINDINGS: 1. Increased tortuosity of the cervical right ICA. Luminal  irregularity along the petrous and cavernous segment of the right ICA, consistent with atherosclerotic disease with focal area of mild stenosis at the petrous segment. 2. 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 3. Opacification of the right ACA, left anterior and posterior circulation related to prominent right posterior communicating artery and anterior communicating artery. PROCEDURE: Under biplane roadmap, a Zoom 71 aspiration catheter was navigated over a phenom 21 microcatheter and a synchro support microguidewire into the cavernous segment of the right ICA. The microcatheter was removed. The aspiration catheter was advanced to the level of occlusion and connected to a penumbra aspiration pump. Continuous aspiration was performed for maintenance. The guiding catheter balloon was inflated. The aspiration catheter was removed under constant aspiration. Follow-up right ICA angiogram showed persistent right M1 occlusion. Under biplane roadmap, a Zoom 71 aspiration catheter was navigated over a phenom 21 microcatheter and a synchro support microguidewire into the cavernous segment of the right ICA. The microcatheter was then navigated over the wire into the right M2/MCA superior division branch. Then, a 6 x 40 mm solitaire stent retriever was deployed spanning the right M1 and M2 segments. The device was allowed to intercalated with the clot for 4 minutes. The microcatheter was removed. The aspiration catheter was advanced to the level of occlusion and connected to a penumbra aspiration pump. The guiding catheter balloon was inflated. The thrombectomy device and aspiration catheter were removed under constant aspiration. Follow-up right ICA angiogram showed complete recanalization of the right MCA territory (TICI 3). Atherosclerotic stenosis at the mid M1 segment was noted. Follow-up angiogram showed slow flow in the right MCA territory. Flat panel CT of the head was obtained and post  processed in a separate workstation with concurrent attending physician supervision. Selected images were sent to PACS. No evidence  of hemorrhage is noted. Right ICA angiograms with frontal and lateral views of the head showed worsening delay of filling of the right MCA vascular tree with severe stenosis at the M1 segment. Using biplane roadmap, a 2.5 mm x 9 mm Gateway balloon was navigated into the right M1 segment over a Zoom 14 wire. Angioplasty performed under fluoroscopy. Follow-up right ICA angiogram showed significant improvement with brisk filling of the right MCA territory. Delayed follow-up showed restenoses of the M1 segment with delayed flow in the MCA territory. Patient received IV bolus of cangrelor followed by low-dose infusion. A 2.5 mm x 15 mm resolute the chronic stent was navigated into the right MCA and deployed across the stenotic M1 segment. Follow-up angiogram showed resolution of the M1 stenosis with brisk anterograde flow in the right MCA territory. Delayed follow-up angiogram showed persistent patency of the stent with no evidence of in stent platelet aggregation. No evidence of thromboembolic complication. The catheter was subsequently withdrawn. Flat panel CT of the head was obtained and post processed in a separate workstation with concurrent attending physician supervision. Selected images were sent to PACS. No evidence of hemorrhage noted. Right common femoral artery angiograms were performed with right anterior oblique and lateral views via sheath side or contrast injection. Atherosclerotic changes of the external iliac and common femoral artery are noted without hemodynamically significant stenosis. The sheath was exchanged over the wire for an 8 [place] changes seen which was utilized for access closure. Immediate hemostasis was achieved. Incidentally noted is left-sided hydronephrosis. IMPRESSION: 1. Mechanical thrombectomy performed with 2 passes (1 pass aspiration and 1 pass  combined technique) with complete recanalization of the right MCA vascular tree (TICI3). 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 2. Angioplasty performed at the stenosis M1 segment with improvement of the flow. Re-stenosis noted on follow-up angiogram. 3. Stenting of the right M1 segment performed with resolution of the M1 segment stenosis and brisk anterograde flow in the right MCA vascular tree. PLAN: 1. Continued cangrelor infusion until head CT is obtained at 4 hours post procedure. 2. Loading of dual anti-platelet in case no hemorrhage seen on follow-up head followed by dual anti-platelet therapy for 6 months. 3. Bed rest post femoral access + tPA and cangrelor. Electronically Signed   By: Pedro Earls M.D.   On: 04/06/2020 16:10   CT C-SPINE NO CHARGE  Result Date: 04/05/2020 CLINICAL DATA:  Stroke. EXAM: CT CERVICAL SPINE WITHOUT CONTRAST TECHNIQUE: Multidetector CT imaging of the cervical spine was performed without intravenous contrast. Multiplanar CT image reconstructions were also generated. COMPARISON:  Open FINDINGS: Alignment: Cervical dextrocurvature. Straightening of the expected cervical lordosis. No significant spondylolisthesis. Skull base and vertebrae: The basion-dental and atlanto-dental intervals are maintained.No evidence of acute fracture to the cervical spine. Well-corticated fragment adjacent to the T1 spinous process, likely chronic Soft tissues and spinal canal: No prevertebral fluid or swelling. No visible canal hematoma. Nonspecific 11 mm cystic appearing subcutaneous lesion within the mid right neck (series 18, image 66). Disc levels: Cervical spondylosis. Most notably at C5-C6 and C6-C7, there is advanced disc space narrowing with posterior disc osteophytes as well as uncovertebral and facet hypertrophy. Bilateral neural foraminal narrowing with suspected at least moderate  spinal canal stenosis at C5-C6. Bilateral neural foraminal narrowing with suspected moderate/severe spinal canal stenosis at C6-C7 Upper chest: No consolidation within the imaged lung apices. No visible pneumothorax. IMPRESSION: No evidence of acute fracture to the  cervical spine. Cervical spondylosis as described and greatest at C5-C6 and C6-C7. Bilateral neural foraminal narrowing with suspected moderate/severe spinal canal stenosis at C6-C7. Bilateral neural foraminal narrowing with suspected at least moderate spinal canal stenosis at C5-C6 Cervical dextrocurvature. Nonspecific cystic appearing 11 mm subcutaneous lesion within the mid right neck. Electronically Signed   By: Kellie Simmering DO   On: 04/05/2020 11:59   DG Abd Portable 1V  Result Date: 04/05/2020 CLINICAL DATA:  69 year old male status post feeding tube placement. EXAM: PORTABLE ABDOMEN - 1 VIEW COMPARISON:  None. FINDINGS: Partially visualized enteric tube with side port and tip in the left upper abdomen likely in the proximal stomach. No bowel dilatation. There is severe dilatation of the left renal collecting system containing excreted contrast. There is atherosclerotic calcification of the aorta. The osseous structures are intact. IMPRESSION: 1. Enteric tube with tip in the proximal stomach. 2. Severe left hydronephrosis. Electronically Signed   By: Anner Crete M.D.   On: 04/05/2020 19:42   ECHOCARDIOGRAM COMPLETE  Result Date: 04/06/2020    ECHOCARDIOGRAM REPORT   Patient Name:   ARYAN SPARKS Date of Exam: 04/06/2020 Medical Rec #:  [id number]    Height:       69.0 in Accession #:    [id number]   Weight:       219.1 lb Date of Birth:  March 14, 1951     BSA:          2.148 m Patient Age:    29 years     BP:           126/111 mmHg Patient Gender: M            HR:           89 bpm. Exam Location:  Inpatient Procedure: 2D Echo Indications:    stroke 434.91  History:        Patient has no prior history of Echocardiogram examinations.                  Arrythmias:PVC; Risk Factors:Hypertension, Current Smoker and                 Sleep Apnea.  Sonographer:    Johny Chess Referring Phys: [phone] [place] Comments: Image acquisition challenging due to uncooperative patient and Image acquisition challenging due to respiratory motion. IMPRESSIONS  1. Left ventricular ejection fraction, by estimation, is 50 to 55%. The left ventricle has low normal function. The left ventricle has no regional wall motion abnormalities. Left ventricular diastolic parameters are consistent with Grade II diastolic dysfunction (pseudonormalization).  2. Right ventricular systolic function is normal. The right ventricular size is normal. Tricuspid regurgitation signal is inadequate for assessing PA pressure.  3. Left atrial size was moderately dilated.  4. The mitral valve is normal in structure. Mild mitral valve regurgitation. No evidence of mitral stenosis.  5. The aortic valve is grossly normal. Aortic valve regurgitation is not visualized. Mild aortic valve sclerosis is present, with no evidence of aortic valve stenosis.  6. The inferior vena cava is dilated in size with >50% respiratory variability, suggesting right atrial pressure of 8 mmHg. Comparison(s): No prior Echocardiogram. FINDINGS  Left Ventricle: Left ventricular ejection fraction, by estimation, is 50 to 55%. The left ventricle has low normal function. The left ventricle has no regional wall motion abnormalities. The left ventricular internal cavity size was normal in size. There is borderline left ventricular hypertrophy. Left ventricular diastolic parameters are consistent with Grade  II diastolic dysfunction (pseudonormalization). Right Ventricle: The right ventricular size is normal. No increase in right ventricular wall thickness. Right ventricular systolic function is normal. Tricuspid regurgitation signal is inadequate for assessing PA pressure. Left Atrium: Left atrial size was moderately  dilated. Right Atrium: Right atrial size was normal in size. Pericardium: Trivial pericardial effusion is present. Mitral Valve: The mitral valve is normal in structure. Mild mitral valve regurgitation. No evidence of mitral valve stenosis. Tricuspid Valve: The tricuspid valve is normal in structure. Tricuspid valve regurgitation is trivial. Aortic Valve: The aortic valve is grossly normal. Aortic valve regurgitation is not visualized. Mild aortic valve sclerosis is present, with no evidence of aortic valve stenosis. There is mild calcification of the aortic valve. Pulmonic Valve: The pulmonic valve was not well visualized. Pulmonic valve regurgitation is trivial. No evidence of pulmonic stenosis. Aorta: The aortic root, ascending aorta and aortic arch are all structurally normal, with no evidence of dilitation or obstruction. Venous: The inferior vena cava is dilated in size with greater than 50% respiratory variability, suggesting right atrial pressure of 8 mmHg. IAS/Shunts: The atrial septum is grossly normal.  LEFT VENTRICLE PLAX 2D LVIDd:         5.00 cm  Diastology LVIDs:         3.70 cm  LV e' lateral:   9.00 cm/s LV PW:         1.20 cm  LV E/e' lateral: 13.8 LV IVS:        1.10 cm  LV e' medial:    5.00 cm/s LVOT diam:     2.10 cm  LV E/e' medial:  24.8 LV SV:         56 LV SV Index:   26 LVOT Area:     3.46 cm  RIGHT VENTRICLE             IVC RV S prime:     17.00 cm/s  IVC diam: 2.50 cm TAPSE (M-mode): 1.9 cm LEFT ATRIUM             Index       RIGHT ATRIUM           Index LA diam:        4.40 cm 2.05 cm/m  RA Area:     18.60 cm LA Vol (A2C):   81.8 ml 38.09 ml/m RA Volume:   51.80 ml  24.12 ml/m LA Vol (A4C):   87.3 ml 40.65 ml/m LA Biplane Vol: 86.7 ml 40.37 ml/m  AORTIC VALVE LVOT Vmax:   97.40 cm/s LVOT Vmean:  70.400 cm/s LVOT VTI:    0.163 m  AORTA Ao Root diam: 3.30 cm Ao Asc diam:  3.40 cm MITRAL VALVE MV Area (PHT): 3.65 cm     SHUNTS MV Decel Time: 208 msec     Systemic VTI:  0.16 m MV E  velocity: 124.00 cm/s  Systemic Diam: 2.10 cm MV A velocity: 94.10 cm/s MV E/A ratio:  1.32 Buford Dresser MD Electronically signed by Buford Dresser MD Signature Date/Time: 04/06/2020/6:38:24 PM    Final    IR PERCUTANEOUS ART THROMBECTOMY/INFUSION INTRACRANIAL INC DIAG ANGIO  Result Date: 04/06/2020 INDICATION: 69 year old male with past medical history significant for tobacco use. He head sudden onset of right gaze deviation left-side paralysis. Systolic blood pressure was 260/149 mmHg upon arrival. His last known well was 10 a.m. on 04/05/2020. Premorbid Rankin scale was 0 and NIHSS at presentation was 19. IV tPA was started at 11:16 a.m.  head CT showed no evidence of intracranial hemorrhage with remote strokes noted in the bilateral centrum semiovale, basal ganglia and right thalamus. CT angiogram of the head and neck showed for a proximal right M1/MCA occlusion. He was then transferred to our service for diagnostic cerebral angiogram and mechanical thrombectomy. EXAM: IR CT HEAD LIMITED; INTRACRANIAL STENT (INCL PTA); IR PERCUTANEOUS ART THORMBECTOMY/INFUSION INTRACRANIAL INCLUDE DIAG ANGIO COMPARISON:  CT/CT angiogram of the head and neck 04/05/2020. MEDICATIONS: IV bolus fallowed by infusion of cangrelor. ANESTHESIA/SEDATION: The procedure was performed under general anesthesia. FLUOROSCOPY TIME:  Fluoroscopy Time: 29 minutes 18 seconds (1,626 mGy). COMPLICATIONS: None immediate. TECHNIQUE: Unable to obtain informed consent as patient could not consent for himself and no next of kin or health care proxy was available. Case discussed with neuro hospitalist team and decision made that proceed with intervention was in the best interest of the patient. Maximal Sterile Barrier Technique was utilized including caps, mask, sterile gowns, sterile gloves, sterile drape, hand hygiene and skin antiseptic. A timeout was performed prior to the initiation of the procedure. The right groin was prepped and  draped in the usual sterile fashion. Using a micropuncture kit and the modified Seldinger technique, access was gained to the right common femoral artery and an 8 French sheath was placed. Under fluoroscopy, an 8 [place] Walrus balloon guide catheter was navigated over a 6 [place] Berenstein 2 catheter and a 0.035" Terumo Glidewire into the aortic arch. The catheter was placed into the right common carotid artery and then advanced into the left internal carotid artery. The inner catheter was removed. Frontal and lateral angiograms of the head were obtained. FINDINGS: 1. Increased tortuosity of the cervical right ICA. Luminal irregularity along the petrous and cavernous segment of the right ICA, consistent with atherosclerotic disease with focal area of mild stenosis at the petrous segment. 2. 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 3. Opacification of the right ACA, left anterior and posterior circulation related to prominent right posterior communicating artery and anterior communicating artery. PROCEDURE: Under biplane roadmap, a Zoom 71 aspiration catheter was navigated over a phenom 21 microcatheter and a synchro support microguidewire into the cavernous segment of the right ICA. The microcatheter was removed. The aspiration catheter was advanced to the level of occlusion and connected to a penumbra aspiration pump. Continuous aspiration was performed for maintenance. The guiding catheter balloon was inflated. The aspiration catheter was removed under constant aspiration. Follow-up right ICA angiogram showed persistent right M1 occlusion. Under biplane roadmap, a Zoom 71 aspiration catheter was navigated over a phenom 21 microcatheter and a synchro support microguidewire into the cavernous segment of the right ICA. The microcatheter was then navigated over the wire into the right M2/MCA superior division branch. Then, a 6 x 40 mm solitaire stent retriever was deployed spanning the right M1  and M2 segments. The device was allowed to intercalated with the clot for 4 minutes. The microcatheter was removed. The aspiration catheter was advanced to the level of occlusion and connected to a penumbra aspiration pump. The guiding catheter balloon was inflated. The thrombectomy device and aspiration catheter were removed under constant aspiration. Follow-up right ICA angiogram showed complete recanalization of the right MCA territory (TICI 3). Atherosclerotic stenosis at the mid M1 segment was noted. Follow-up angiogram showed slow flow in the right MCA territory. Flat panel CT of the head was obtained and post processed in a separate workstation with concurrent attending physician supervision. Selected images were sent to PACS.  No evidence of hemorrhage is noted. Right ICA angiograms with frontal and lateral views of the head showed worsening delay of filling of the right MCA vascular tree with severe stenosis at the M1 segment. Using biplane roadmap, a 2.5 mm x 9 mm Gateway balloon was navigated into the right M1 segment over a Zoom 14 wire. Angioplasty performed under fluoroscopy. Follow-up right ICA angiogram showed significant improvement with brisk filling of the right MCA territory. Delayed follow-up showed restenoses of the M1 segment with delayed flow in the MCA territory. Patient received IV bolus of cangrelor followed by low-dose infusion. A 2.5 mm x 15 mm resolute the chronic stent was navigated into the right MCA and deployed across the stenotic M1 segment. Follow-up angiogram showed resolution of the M1 stenosis with brisk anterograde flow in the right MCA territory. Delayed follow-up angiogram showed persistent patency of the stent with no evidence of in stent platelet aggregation. No evidence of thromboembolic complication. The catheter was subsequently withdrawn. Flat panel CT of the head was obtained and post processed in a separate workstation with concurrent attending physician supervision.  Selected images were sent to PACS. No evidence of hemorrhage noted. Right common femoral artery angiograms were performed with right anterior oblique and lateral views via sheath side or contrast injection. Atherosclerotic changes of the external iliac and common femoral artery are noted without hemodynamically significant stenosis. The sheath was exchanged over the wire for an 8 [place] changes seen which was utilized for access closure. Immediate hemostasis was achieved. Incidentally noted is left-sided hydronephrosis. IMPRESSION: 1. Mechanical thrombectomy performed with 2 passes (1 pass aspiration and 1 pass combined technique) with complete recanalization of the right MCA vascular tree (TICI3). 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 2. Angioplasty performed at the stenosis M1 segment with improvement of the flow. Re-stenosis noted on follow-up angiogram. 3. Stenting of the right M1 segment performed with resolution of the M1 segment stenosis and brisk anterograde flow in the right MCA vascular tree. PLAN: 1. Continued cangrelor infusion until head CT is obtained at 4 hours post procedure. 2. Loading of dual anti-platelet in case no hemorrhage seen on follow-up head followed by dual anti-platelet therapy for 6 months. 3. Bed rest post femoral access + tPA and cangrelor. Electronically Signed   By: Pedro Earls M.D.   On: 04/06/2020 16:10   CT HEAD CODE STROKE WO CONTRAST  Addendum Date: 04/05/2020   ADDENDUM REPORT: 04/05/2020 11:14 ADDENDUM: These results were called by telephone at the time of interpretation on 04/05/2020 at 11:14 am to provider Dr. Rory Percy, who verbally acknowledged these results. Electronically Signed   By: Kellie Simmering DO   On: 04/05/2020 11:14   Result Date: 04/05/2020 CLINICAL DATA:  Code stroke. Neuro deficit, acute, stroke suspected. EXAM: CT HEAD WITHOUT CONTRAST TECHNIQUE:  Contiguous axial images were obtained from the base of the skull through the vertex without intravenous contrast. COMPARISON:  No pertinent prior studies available for comparison. FINDINGS: Brain: The examination is mildly motion degraded. Mild generalized parenchymal atrophy. There is no acute intracranial hemorrhage. No acute demarcated cortical infarct is identified. There are numerous small lacunar infarcts within the bilateral cerebral white matter, right thalamus and bilateral basal ganglia which appear chronic. Background mild patchy hypoattenuation within the cerebral white matter which is nonspecific, but consistent with chronic small vessel ischemic disease. No extra-axial fluid collection. No evidence of intracranial mass. No midline shift. Vascular: No hyperdense  vessel.  Atherosclerotic calcifications Skull: Normal. Negative for fracture or focal lesion. Sinuses/Orbits: Visualized orbits show no acute finding. Small right maxillary sinus mucous retention cyst. Mild ethmoid sinus mucosal thickening. No significant mastoid effusion. ASPECTS [facility] Stroke Program Early CT Score) - Ganglionic level infarction (caudate, lentiform nuclei, internal capsule, insula, M1-M3 cortex): 7 - Supraganglionic infarction (M4-M6 cortex): 3 Total score (0-10 with 10 being normal): 10 IMPRESSION: 1. Mildly motion degraded examination. 2. No CT evidence of acute infarct or acute intracranial hemorrhage. 3. Numerous small chronic appearing lacunar infarcts within the cerebral white matter, bilateral basal ganglia and right thalamus. Background mild chronic small vessel ischemic changes within the cerebral white matter. 4. Mild generalized parenchymal atrophy. 5. Mild ethmoid sinus mucosal thickening. Small right maxillary sinus mucous retention cyst. Electronically Signed: By: Kellie Simmering DO On: 04/05/2020 11:11   CT ANGIO HEAD CODE STROKE  Result Date: 04/05/2020 CLINICAL DATA:  Stroke, follow-up. EXAM: CT ANGIOGRAPHY  HEAD AND NECK TECHNIQUE: Multidetector CT imaging of the head and neck was performed using the standard protocol during bolus administration of intravenous contrast. Multiplanar CT image reconstructions and MIPs were obtained to evaluate the vascular anatomy. Carotid stenosis measurements (when applicable) are obtained utilizing NASCET criteria, using the distal internal carotid diameter as the denominator. CONTRAST:  Administered contrast not known at this time. COMPARISON:  Concurrently performed noncontrast head CT. FINDINGS: CTA NECK FINDINGS Aortic arch: Standard aortic branching. Atherosclerotic plaque within the visualized aortic arch and proximal major branch vessels of the neck. No hemodynamically significant innominate or proximal subclavian artery stenosis. Right carotid system: CCA and ICA patent within the neck without significant stenosis (50% or greater). Moderate mixed plaque within the carotid bifurcation and proximal ICA. Mild to moderate mixed plaque is also present within the distal cervical ICA Left carotid system: CCA and ICA patent within the neck. Moderate mixed plaque within the carotid bifurcation and proximal ICA. Narrowing of the proximal ICA of 40-50% Vertebral arteries: The dominant right vertebral artery is patent within the neck without significant stenosis (50% or greater). The non dominant left vertebral artery is patent within the neck. Moderate atherosclerotic narrowing at the origin of this vessel Skeleton: No acute bony abnormality or aggressive osseous lesion. Cervical spondylosis with multilevel disc space narrowing, posterior disc osteophytes, uncovertebral and facet hypertrophy. Spondylosis is greatest at C5-C6 and C6-C7 Other neck: Thyroid unremarkable. Nonspecific subcutaneous cystic appearing lesion within the right mid neck measuring 10 mm (series 100, image 411). Upper chest: No consolidation within the imaged lung apices Review of the MIP images confirms the above  findings CTA HEAD FINDINGS Anterior circulation: The intracranial internal carotid arteries are patent. Atherosclerotic plaque within both vessels. Mild to moderate narrowing of the pre cavernous right ICA. 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 Some reconstitution of flow is seen within M2 and more distal right MCA branch vessels, although asymmetrically decreased as compared to the left. The M1 left middle cerebral artery is patent. No left M2 proximal branch occlusion or high-grade proximal stenosis is identified. The anterior cerebral arteries are patent. No intracranial aneurysm is identified. Posterior circulation: The intracranial vertebral arteries are patent without significant stenosis. Mild nonstenotic plaque within the V4 right vertebral artery. The basilar artery is patent without significant stenosis. The posterior cerebral arteries are patent. There is a sizable right posterior communicating artery. The left posterior communicating artery is hypoplastic or  absent. Moderate stenosis within the proximal P2 right PCA. Moderate stenosis within the mid P2 left PCA. Venous sinuses: Within limitations of contrast timing, no convincing thrombus. 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 These results were called by telephone at the time of interpretation on 04/05/2020 at 11:14 am to provider Dr. Rory Percy, who verbally acknowledged these results. IMPRESSION: CTA neck: 1. The left common and internal carotid arteries are patent within the neck. 40-50% atherosclerotic narrowing of the proximal left ICA. 2. The right common and internal carotid arteries are patent within the neck without significant stenosis. Moderate mixed plaque within the right carotid bifurcation and proximal ICA. Mild  to moderate mixed plaque within the distal cervical ICA. 3. The vertebral arteries are patent within the neck bilaterally with the right being dominant. Moderate stenosis at the origin of the non dominant left vertebral artery. CTA head: 1. 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 There is some reconstitution of flow within M2 and more distal right MCA branch vessels, although asymmetrically decreased as compared to the left. 2. Sites of up to moderate stenosis within both intracranial internal carotid arteries. 3. Moderate stenoses within the P2 posterior cerebral arteries bilaterally. Electronically Signed   By: Kellie Simmering DO   On: 04/05/2020 11:44   CT ANGIO NECK CODE STROKE  Result Date: 04/05/2020 CLINICAL DATA:  Stroke, follow-up. EXAM: CT ANGIOGRAPHY HEAD AND NECK TECHNIQUE: Multidetector CT imaging of the head and neck was performed using the standard protocol during bolus administration of intravenous contrast. Multiplanar CT image reconstructions and MIPs were obtained to evaluate the vascular anatomy. Carotid stenosis measurements (when applicable) are obtained utilizing NASCET criteria, using the distal internal carotid diameter as the denominator. CONTRAST:  Administered contrast not known at this time. COMPARISON:  Concurrently performed noncontrast head CT. FINDINGS: CTA NECK FINDINGS Aortic arch: Standard aortic branching. Atherosclerotic plaque within the visualized aortic arch and proximal major branch vessels of the neck. No hemodynamically significant innominate or proximal subclavian artery stenosis. Right carotid system: CCA and ICA patent within the neck without significant stenosis (50% or greater). Moderate mixed plaque within the carotid bifurcation and proximal ICA. Mild to moderate mixed plaque is also present within the distal cervical ICA Left carotid system: CCA and ICA patent within the neck. Moderate mixed plaque within the carotid bifurcation and  proximal ICA. Narrowing of the proximal ICA of 40-50% Vertebral arteries: The dominant right vertebral artery is patent within the neck without significant stenosis (50% or greater). The non dominant left vertebral artery is patent within the neck. Moderate atherosclerotic narrowing at the origin of this vessel Skeleton: No acute bony abnormality or aggressive osseous lesion. Cervical spondylosis with multilevel disc space narrowing, posterior disc osteophytes, uncovertebral and facet hypertrophy. Spondylosis is greatest at C5-C6 and C6-C7 Other neck: Thyroid unremarkable. Nonspecific subcutaneous cystic appearing lesion within the right mid neck measuring 10 mm (series 100, image 411). Upper chest: No consolidation within the imaged lung apices Review of the MIP images confirms the above findings CTA HEAD FINDINGS Anterior circulation: The intracranial internal carotid arteries are patent. Atherosclerotic plaque within both vessels. Mild to moderate narrowing of the pre cavernous right ICA. 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 Some reconstitution of flow is seen within M2 and more distal right MCA  branch vessels, although asymmetrically decreased as compared to the left. The M1 left middle cerebral artery is patent. No left M2 proximal branch occlusion or high-grade proximal stenosis is identified. The anterior cerebral arteries are patent. No intracranial aneurysm is identified. Posterior circulation: The intracranial vertebral arteries are patent without significant stenosis. Mild nonstenotic plaque within the V4 right vertebral artery. The basilar artery is patent without significant stenosis. The posterior cerebral arteries are patent. There is a sizable right posterior communicating artery. The left posterior communicating artery is hypoplastic or absent. Moderate stenosis within  the proximal P2 right PCA. Moderate stenosis within the mid P2 left PCA. Venous sinuses: Within limitations of contrast timing, no convincing thrombus. 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 These results were called by telephone at the time of interpretation on 04/05/2020 at 11:14 am to provider Dr. Rory Percy, who verbally acknowledged these results. IMPRESSION: CTA neck: 1. The left common and internal carotid arteries are patent within the neck. 40-50% atherosclerotic narrowing of the proximal left ICA. 2. The right common and internal carotid arteries are patent within the neck without significant stenosis. Moderate mixed plaque within the right carotid bifurcation and proximal ICA. Mild to moderate mixed plaque within the distal cervical ICA. 3. The vertebral arteries are patent within the neck bilaterally with the right being dominant. Moderate stenosis at the origin of the non dominant left vertebral artery. CTA head: 1. 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 There is some reconstitution of flow within M2 and more distal right MCA branch vessels, although asymmetrically decreased as compared to the left. 2. Sites of up to moderate stenosis within both intracranial internal carotid arteries. 3. Moderate stenoses within the P2 posterior cerebral arteries bilaterally. Electronically Signed   By: Kellie Simmering DO   On: 04/05/2020 11:44    Labs:  CBC: Recent Labs    04/05/20 1049 04/05/20 1056 04/07/20 0421  WBC 9.0  --  9.3  HGB 16.0 16.3 13.1  HCT 46.7 48.0 39.9  PLT 205  --  174    COAGS: Recent Labs    04/05/20 1049  INR 1.0  APTT 29    BMP: Recent Labs    04/05/20 1049 04/05/20 1056 04/07/20 0421  NA 136 137 142  K 3.4* 3.3* 3.0*  CL 99 99 106  CO2 21*  --  23  GLUCOSE 351* 356* 136*  BUN 13 15 7*  CALCIUM 9.5  --  8.5*  CREATININE 1.69* 1.60* 1.12   GFRNONAA 41*  --  >60  GFRAA 48*  --  >60    LIVER FUNCTION TESTS: Recent Labs    04/05/20 1049  BILITOT 0.8  AST 39  ALT 38  ALKPHOS 80  PROT 7.0  ALBUMIN 3.6    Assessment and Plan:  History of acute CVA s/p cerebral arteriogram with emergent mechanical thrombectomy of right MCA M1 occlusion along with stent assisted angioplasty of right MCA M1 underlying stenosis via right femoral approach 04/05/2020 by Dr. Karenann Cai. Patient's condition stable- still drowsy (opens eyes to voice and follows some simple commands but quickly falls back asleep), moving right side, left side with minimal flicker from pain, speech dysarthric, left facial droop, right gaze preference. Right groin puncture site stable, distal pulses (DPs) 1+ bilaterally. Tobacco/alcohol abuse- on CIWA protocol/nicotine patch. Continue taking Brilinta 90 mg twice daily and Aspirin 81 mg once daily-  samples and coupon card left in patient room to assist with Brilinta use OP. Plan to follow-up with Dr. Karenann Cai in clinic 4 weeks after discharge- order placed to facilitate this. Further plans per neurology- appreciate and agree with management. Please call [facility] with questions/concerns.   Electronically Signed: Earley Abide, PA-C 04/07/2020, 9:34 AM   I spent a total of 25 Minutes at the the patient's bedside AND on the patient's hospital floor or unit, greater than 50% of which was counseling/coordinating care for CVA s/p revascularization.

## 2020-04-07 NOTE — Evaluation (Signed)
Speech Language Pathology Evaluation [name] MRN: [id number] DOB: 03-04-1951 Today's Date: 04/07/2020 Time: 4010-2725 SLP Time Calculation (min) (ACUTE ONLY): 13 min  Problem List:  Patient Active Problem List   Diagnosis Date Noted  . Stroke (cerebrum) ([place]) 04/05/2020   Past Medical History: History reviewed. No pertinent past medical history. Past Surgical History:  Past Surgical History:  Procedure Laterality Date  . IR CT HEAD LTD  04/05/2020  . IR INTRA CRAN STENT  04/05/2020  . IR PERCUTANEOUS ART THROMBECTOMY/INFUSION INTRACRANIAL INC DIAG ANGIO  04/05/2020      . IR PERCUTANEOUS ART THROMBECTOMY/INFUSION INTRACRANIAL INC DIAG ANGIO  04/05/2020  . RADIOLOGY WITH ANESTHESIA N/A 04/05/2020   Procedure: IR WITH ANESTHESIA;  Surgeon: Radiologist, Medication, MD;  Location: [facility];  Service: Radiology;  Laterality: N/A;   HPI:  Patient is a 69 y/o male who presents with acute CVA s/p cerebral arteriogram with emergent mechanical thrombectomy of right MCA M1 occlusion along with stent assisted angioplasty of right MCA M1 04/05/20. s/p tpA. + cocaine. 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 No PMH.   Assessment / Plan / Recommendation Clinical Impression  Pt presents with significant cognitive deficits with left inattention, reduced LOA, and limited sustained attention that requires frequent stimulation. When attentive he can follow one-step commands well and demonstrates orientation x4. With Mod cues he can find his L hand, but shows no intellectual or emergent awareness of his deficits at this time. His speech is moderately dysarthric, with quick rate also impacting intelligibility at the word to short phrase level. There are times when he seems to have dysfluencies as well. He will benefit from intensive SLP f/u to maximize safety, independence, and communication.     SLP  Assessment  SLP Recommendation/Assessment: Patient needs continued Speech Lanaguage Pathology Services SLP Visit Diagnosis: Dysarthria and anarthria (R47.1);Cognitive communication deficit (R41.841)    Follow Up Recommendations  Inpatient Rehab    Frequency and Duration min 2x/week  2 weeks      SLP Evaluation Cognition  Overall Cognitive Status: Impaired/Different from baseline Arousal/Alertness: Lethargic Orientation Level: Oriented X4 Attention: Sustained Sustained Attention: Impaired Sustained Attention Impairment: Verbal basic;Functional basic Memory: Impaired Memory Impairment: Retrieval deficit;Storage deficit Awareness: Impaired Awareness Impairment: Intellectual impairment;Emergent impairment;Anticipatory impairment Problem Solving: Impaired Problem Solving Impairment: Verbal basic;Functional basic Safety/Judgment: Impaired       Comprehension  Auditory Comprehension Overall Auditory Comprehension: Appears within functional limits for tasks assessed (simple tasks)    Expression Expression Primary Mode of Expression: Verbal Verbal Expression Overall Verbal Expression:  (needs more assessment as speech clears, but fluent)   Oral / Motor  Motor Speech Overall Motor Speech: Impaired Respiration: Within functional limits Phonation: Normal Articulation: Impaired Level of Impairment: Word Intelligibility: Intelligibility reduced Word: 25-49% accurate Phrase: 25-49% accurate   GO                    Osie Bond., M.A. [facility] Pager [phone] Office [phone]  04/07/2020, 4:12 PM

## 2020-04-07 NOTE — Progress Notes (Signed)
Physical Therapy Treatment [name] MRN: [id number] DOB: 1951/03/31 Today's Date: 04/07/2020    History of Present Illness Patient is a 69 y/o male who presents with acute CVA s/p cerebral arteriogram with emergent mechanical thrombectomy of right MCA M1 occlusion along with stent assisted angioplasty of right MCA M1 04/05/20. s/p tpA. + cocaine. 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 No PMH.    PT Comments    Pt remains limited by lethargy although does respond with increased time and significant stimulation. Pt continues to require max-totalA for all mobility and balance at this time. In standing activities pt has a strong push toward the left side, placing the pt at a very high falls risk. Pt with impaired tracking with left eye during session, although denies dizziness or double vision. Pt will benefit from continued acute PT POC to improve mobility quality and to reduce falls risk.   Follow Up Recommendations  CIR     Equipment Recommendations  Wheelchair (measurements PT);Wheelchair cushion (measurements PT);Hospital bed;Other (comment) (mechanical lift if home today)    Recommendations for Other Services       Precautions / Restrictions Precautions Precautions: Fall Precaution Comments: NG tube; SBP <180 Restrictions Weight Bearing Restrictions: No    Mobility  Bed Mobility Overal bed mobility: Needs Assistance Bed Mobility: Supine to Sit     Supine to sit: Total assist;+2 for physical assistance        Transfers Overall transfer level: Needs assistance Equipment used: 2 person hand held assist Transfers: Sit to/from Stand;Stand Pivot Transfers Sit to Stand: Max assist;+2 physical assistance Stand pivot transfers: Max assist;+2 physical assistance       General transfer comment: pt pushing toward left side in standing activities, heave  maxAx2  Ambulation/Gait                 Stairs             Wheelchair Mobility    Modified Rankin (Stroke Patients Only) Modified Rankin (Stroke Patients Only) Pre-Morbid Rankin Score: No symptoms Modified Rankin: Severe disability     Balance Overall balance assessment: Needs assistance Sitting-balance support: Bilateral upper extremity supported;Feet supported Sitting balance-Leahy Scale: Zero Sitting balance - Comments: maxA, able to partially correct to upright intermittently with cueing Postural control: Posterior lean Standing balance support: Bilateral upper extremity supported Standing balance-Leahy Scale: Zero Standing balance comment: maxA x2, strong pushing toward L side                            Cognition Arousal/Alertness: Lethargic Behavior During Therapy: Flat affect Overall Cognitive Status: Impaired/Different from baseline Area of Impairment: Attention;Memory;Following commands;Safety/judgement;Awareness;Problem solving                   Current Attention Level: Focused Memory: Decreased recall of precautions;Decreased short-term memory Following Commands: Follows one step commands with increased time (increased stimulation) Safety/Judgement: Decreased awareness of safety;Decreased awareness of deficits Awareness: Intellectual Problem Solving: Slow processing;Requires verbal cues;Requires tactile cues;Decreased initiation        Exercises      General Comments General comments (skin integrity, edema, etc.): VSS on RA, pt hypertensive to 165/91 after mobility      Pertinent Vitals/Pain Pain Assessment: Faces Faces Pain Scale: No hurt    Home Living  Prior Function            PT Goals (current goals can now be found in the care plan section) Acute Rehab PT Goals Patient Stated Goal: too lethargic to state Progress towards PT goals: Progressing toward goals (slowly, limited by  lethargy)    Frequency    Min 4X/week      PT Plan Current plan remains appropriate    Co-evaluation PT/OT/SLP Co-Evaluation/Treatment: Yes Reason for Co-Treatment: Complexity of the patient's impairments (multi-system involvement);Necessary to address cognition/behavior during functional activity;For patient/therapist safety;To address functional/ADL transfers PT goals addressed during session: Mobility/safety with mobility;Balance;Strengthening/ROM        AM-PAC PT "6 Clicks" Mobility   Outcome Measure  Help needed turning from your back to your side while in a flat bed without using bedrails?: Total Help needed moving from lying on your back to sitting on the side of a flat bed without using bedrails?: Total Help needed moving to and from a bed to a chair (including a wheelchair)?: Total Help needed standing up from a chair using your arms (e.g., wheelchair or bedside chair)?: Total Help needed to walk in hospital room?: Total Help needed climbing 3-5 steps with a railing? : Total 6 Click Score: 6    End of Session Equipment Utilized During Treatment: Gait belt Activity Tolerance: Patient limited by lethargy Patient left: in chair;with call bell/phone within reach;with chair alarm set Nurse Communication: Mobility status;Need for lift equipment PT Visit Diagnosis: Hemiplegia and hemiparesis;Difficulty in walking, not elsewhere classified (R26.2);Unsteadiness on feet (R26.81) Hemiplegia - Right/Left: Left Hemiplegia - dominant/non-dominant: Non-dominant Hemiplegia - caused by: Cerebral infarction     Time: 1139-1202 PT Time Calculation (min) (ACUTE ONLY): 23 min  Charges:  $Therapeutic Activity: 8-22 mins                     Zenaida Niece, PT, DPT Acute Rehabilitation Pager: [phone]    Zenaida Niece 04/07/2020, 1:07 PM

## 2020-04-07 NOTE — Procedures (Signed)
Cortrak  Person Inserting Tube:  Rosezetta Schlatter, RD Tube Type:  Cortrak - 43 inches Tube Location:  Left nare Initial Placement:  Postpyloric Secured by: Bridle Technique Used to Measure Tube Placement:  Documented cm marking at nare/ corner of mouth Cortrak Secured At:  73 cm Procedure Comments:  Cortrak Tube Team Note:  Consult received to place a Cortrak feeding tube.   No x-ray is required. RN may begin using tube.    If the tube becomes dislodged please keep the tube and contact the Cortrak team at [email or web address] (password [email or web address]) for replacement.  If after hours and replacement cannot be delayed, place a NG tube and confirm placement with an abdominal x-ray.      Jarome Matin, MS, RD, LDN, CNSC Inpatient Clinical Dietitian RD pager # available in [place]  After hours/weekend pager # available in [facility]

## 2020-04-07 NOTE — Progress Notes (Signed)
Initial Nutrition Assessment  DOCUMENTATION CODES:   Obesity unspecified  INTERVENTION:   Initiate tube feeding via Cortrak tube (post-pyloric): Jevity 1.2 at 70 ml/h (1680 ml per day) Prosource TF 45 ml TID MVI daily  Provides 2136 kcal, 126 gm protein, 1362 ml free water daily  Monitor magnesium and phosphorus every 12 hours x 4 occurances, MD to replete as needed, as pt is at risk for refeeding syndrome given ETOH abuse.  Check Vitamin C and Vitamin A levels   NUTRITION DIAGNOSIS:   Inadequate oral intake related to inability to eat as evidenced by NPO status.  GOAL:   Patient will meet greater than or equal to 90% of their needs  MONITOR:   Diet advancement, TF tolerance  REASON FOR ASSESSMENT:   Consult Enteral/tube feeding initiation and management  ASSESSMENT:   Pt with PMH of uncontrolled DM, tobacco abuse, ETOH/drug abuse admitted with R MCA infarct s/p tPA and IR for revascularization of R M1 with stent.   Pt discussed during ICU rounds and with RN.  Cleviprex drip off  Spoke with sister and her husband who live in [place]. They report that pt would not go to a doctor despite their efforts. Unsure of his intake of nutrition/drugs/ETOH. Pt has reported that he drinks 2 beers per day.  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 She states he does not have a car.  Pt unable to answer any questions. Did not open eyes during exam.   7/14 cortrak placed; tip postpyloric   Medications reviewed and include: folic acid, SSI, 12 units levemir BID, MVI, 40 mEq KCl every 4 hours, thiamine  2 g mag sulfate x 1 IV  Labs reviewed: K+ 3, magnesium 1.4  CBG's: [phone] Lab Results  Component Value Date   HGBA1C 13.5 (H) 04/06/2020     NUTRITION - FOCUSED PHYSICAL EXAM:    Most Recent Value  Orbital Region No depletion  Upper Arm Region No depletion  Thoracic and Lumbar Region No depletion  Buccal Region No depletion  Temple Region No depletion   Clavicle Bone Region No depletion  Clavicle and Acromion Bone Region No depletion  Scapular Bone Region No depletion  Dorsal Hand No depletion  Patellar Region No depletion  Anterior Thigh Region Mild depletion  Posterior Calf Region No depletion  Edema (RD Assessment) None  Hair Reviewed  Eyes Unable to assess  Mouth Unable to assess  Skin Reviewed  Nails Reviewed       Diet Order:   Diet Order            Diet NPO time specified  Diet effective now                 EDUCATION NEEDS:   No education needs have been identified at this time  Skin:  Skin Assessment: Reviewed RN Assessment  Last BM:  unknown  Height:   Ht Readings from Last 1 Encounters:  04/05/20 5\' 9"  (1.753 m)    Weight:   Wt Readings from Last 1 Encounters:  04/05/20 99.4 kg    Ideal Body Weight:  72.7 kg  BMI:  Body mass index is 32.36 kg/m.  Estimated Nutritional Needs:   Kcal:  2100-2300  Protein:  115-130 grams  Fluid:  2L/day  Lockie Pares., RD, LDN, CNSC See AMiON for contact information

## 2020-04-07 NOTE — Progress Notes (Signed)
  Speech Language Pathology Treatment: Dysphagia  [name] MRN: [id number] DOB: December 02, 1950 Today's Date: 04/07/2020 Time: 1510-1520 SLP Time Calculation (min) (ACUTE ONLY): 10 min  Assessment / Plan / Recommendation Clinical Impression  Pt remains drowsy but is upright in his chair and can be aroused with consistent stimulation. He has anterior loss and oral residue on his L side without awareness. Saliva is also pooling in his mouth at baseline. Immediate coughing after small sips of water is concerning for decreased ability to protect his airway. Despite consistent signs of dysphagia with suspected aspiration, he is consistently demonstrating hyolaryngeal movement to palpation. I think he will be ready for MBS as soon as he can maintain his alertness a little longer. Until then, he does have cortrak in place.     HPI HPI: Patient is a 69 y/o male who presents with acute CVA s/p cerebral arteriogram with emergent mechanical thrombectomy of right MCA M1 occlusion along with stent assisted angioplasty of right MCA M1 04/05/20. s/p tpA. + cocaine. 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 No PMH.      SLP Plan  Continue with current plan of care       Recommendations  Diet recommendations: NPO Medication Administration: Via alternative means                Oral Care Recommendations: Oral care QID Follow up Recommendations: Inpatient Rehab SLP Visit Diagnosis: Dysphagia, unspecified (R13.10) Plan: Continue with current plan of care       GO                Osie Bond., M.A. [place] [facility] Pager [phone] Office [phone]  04/07/2020, 4:07 PM

## 2020-04-07 NOTE — Progress Notes (Signed)
The chaplain visited with the family after receiving a consult for a POA. The family requested assistance in getting a POA so that they could pay the patient's bills. The chaplain informed the family that they could assist in getting the paperwork signed, but did not have POA paperwork on hand and they would need to get that paperwork from an outside source. The chaplain is available for follow-up when they have gotten the paperwork.  Brion Aliment Chaplain Resident For questions concerning this note please contact me by pager [phone]

## 2020-04-07 NOTE — Evaluation (Signed)
Occupational Therapy Evaluation [name] MRN: [id number] DOB: 1951-02-09 Today's Date: 04/07/2020    History of Present Illness Patient is a 69 y/o male who presents with acute CVA s/p cerebral arteriogram with emergent mechanical thrombectomy of right MCA M1 occlusion along with stent assisted angioplasty of right MCA M1 04/05/20. s/p tpA. + cocaine. 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 No PMH.   Clinical Impression   PT admitted with R MCA. Pt currently with functional limitiations due to the deficits listed below (see OT problem list). Pt requires total +2 max (A) to transfer toward R side to chair. Pt initiates the power up but does not sustain and requires total blocking LLE. Pt no activation of L side during sitting or transfer.  Pt will benefit from skilled OT to increase their independence and safety with adls and balance to allow discharge SNF.     Follow Up Recommendations  SNF    Equipment Recommendations  Wheelchair (measurements OT);Wheelchair cushion (measurements OT);Hospital bed    Recommendations for Other Services       Precautions / Restrictions Precautions Precautions: Fall Precaution Comments: NG tube; SBP <180      Mobility Bed Mobility Overal bed mobility: Needs Assistance Bed Mobility: Supine to Sit     Supine to sit: Total assist;+2 for physical assistance     General bed mobility comments: pt requires (A) to bring LE to EOB and elevate trunk from surface. pt answering but keeping eyes closed  Transfers Overall transfer level: Needs assistance Equipment used: 2 person hand held assist Transfers: Sit to/from [employer] Transfers Sit to Stand: Max assist;+2 physical assistance Stand pivot transfers: Max assist;+2 physical assistance       General transfer comment: pt pushing toward left side in standing activities, heave  maxAx2    Balance Overall balance assessment: Needs assistance Sitting-balance support: Bilateral upper extremity supported;Feet supported Sitting balance-Leahy Scale: Zero Sitting balance - Comments: maxA, able to partially correct to upright intermittently with cueing Postural control: Posterior lean Standing balance support: Bilateral upper extremity supported Standing balance-Leahy Scale: Zero Standing balance comment: maxA x2, strong pushing toward L side                           ADL either performed or assessed with clinical judgement   ADL Overall ADL's : Needs assistance/impaired     Grooming: Total assistance   Upper Body Bathing: Total assistance   Lower Body Bathing: Total assistance   Upper Body Dressing : Total assistance   Lower Body Dressing: Total assistance   Toilet Transfer: Total assistance                   Vision   Vision Assessment?: Vision impaired- to be further tested in functional context Additional Comments: noted to have R eye deviation with inability to cross to L      Perception     Praxis      Pertinent Vitals/Pain Pain Assessment: Faces Faces Pain Scale: No hurt     Hand Dominance Right   Extremity/Trunk Assessment Upper Extremity Assessment Upper Extremity Assessment: LUE deficits/detail LUE Deficits / Details: Flaccid LUE, associated reaction noted with yawning. Flexor withdrawal to noxious stimulus. LUE Sensation: decreased light touch;decreased proprioception LUE Coordination: decreased gross motor;decreased fine motor   Lower Extremity Assessment Lower Extremity Assessment: LLE deficits/detail LLE Deficits / Details: Flaccidity LLE; withdrawal  to noxious stimulus. LLE Sensation: decreased light touch LLE Coordination: decreased fine motor;decreased gross motor       Communication Communication Communication: Expressive difficulties (dysarthria)   Cognition Arousal/Alertness: Lethargic Behavior  During Therapy: Flat affect Overall Cognitive Status: Impaired/Different from baseline Area of Impairment: Attention;Memory;Following commands;Safety/judgement;Awareness;Problem solving                   Current Attention Level: Focused Memory: Decreased recall of precautions;Decreased short-term memory Following Commands: Follows one step commands with increased time (increased stimulation) Safety/Judgement: Decreased awareness of safety;Decreased awareness of deficits Awareness: Intellectual Problem Solving: Slow processing;Requires verbal cues;Requires tactile cues;Decreased initiation General Comments: Given ativan prior to MRI this Am and still very lethargic and sleepy. A&Ox4. Without stimulus, pt falling back asleep. Used cold wash cloth, sternal rub, verbal cues and sitting upright but not able to sustain arousal to participate. Not able to open eyes, despite trying.   General Comments  165/91 with transfer from EOB to chair    Exercises     Shoulder Instructions      Home Living Family/patient expects to be discharged to:: Private residence                                 Additional Comments: Not able to gather home setup due to pt's poor arousal and attention. Will obtain next session.      Prior Functioning/Environment Level of Independence: Independent        Comments: Driving and working PTA.        OT Problem List: Decreased activity tolerance;Impaired balance (sitting and/or standing);Decreased strength;Decreased range of motion;Decreased cognition;Decreased safety awareness;Decreased knowledge of use of DME or AE;Decreased knowledge of precautions;Pain;Impaired UE functional use;Cardiopulmonary status limiting activity;Impaired sensation;Obesity;Decreased coordination;Impaired vision/perception      OT Treatment/Interventions: Self-care/ADL training;Therapeutic exercise;Neuromuscular education;Energy conservation;DME and/or AE  instruction;Manual therapy;Modalities;Therapeutic activities;Balance training;Patient/family education;Visual/perceptual remediation/compensation;Cognitive remediation/compensation    OT Goals(Current goals can be found in the care plan section) Acute Rehab OT Goals Patient Stated Goal: too lethargic to state OT Goal Formulation: Patient unable to participate in goal setting Time For Goal Achievement: 04/21/20 Potential to Achieve Goals: Good  OT Frequency: Min 2X/week   Barriers to D/C: Decreased caregiver support          Co-evaluation PT/OT/SLP Co-Evaluation/Treatment: Yes Reason for Co-Treatment: Complexity of the patient's impairments (multi-system involvement);For patient/therapist safety;To address functional/ADL transfers   OT goals addressed during session: ADL's and self-care;Proper use of Adaptive equipment and DME;Strengthening/ROM      AM-PAC OT "6 Clicks" Daily Activity     Outcome Measure Help from another person eating meals?: A Lot Help from another person taking care of personal grooming?: A Lot Help from another person toileting, which includes using toliet, bedpan, or urinal?: Total Help from another person bathing (including washing, rinsing, drying)?: Total Help from another person to put on and taking off regular upper body clothing?: A Lot Help from another person to put on and taking off regular lower body clothing?: Total 6 Click Score: 9   End of Session Equipment Utilized During Treatment: Gait belt Nurse Communication: Mobility status;Precautions  Activity Tolerance: Patient tolerated treatment well Patient left: in chair;with call bell/phone within reach;with chair alarm set  OT Visit Diagnosis: Unsteadiness on feet (R26.81);Muscle weakness (generalized) (M62.81)                Time: 6294-7654 OT Time Calculation (min): 25 min Charges:  OT General  Charges $OT Visit: 1 Visit OT Evaluation $OT Eval Moderate Complexity: 1 Mod OT  Treatments $Self Care/Home Management : 8-22 mins   Brynn, OTR/L  Acute Rehabilitation Services Pager: [phone] Office: [phone] .   Jeri Modena 04/07/2020, 5:04 PM

## 2020-04-07 NOTE — Progress Notes (Signed)
STROKE TEAM PROGRESS NOTE   INTERVAL HISTORY Speech therapy is at bedside.  Patient reclining in chair, lethargic, however fully orientated but severe dysarthria.  Did not help swallow.  Change NG tube to core track tube and start tube feeding.  Received several doses of Ativan for alcohol withdrawal.  Vitals:   04/07/20 0500 04/07/20 0600 04/07/20 0700 04/07/20 0800  BP: (!) 146/73 131/79 (!) 162/84 (!) 155/115  Pulse: 78 78 85 87  Resp: 15 18 (!) 25 12  Temp:    98.9 F (37.2 C)  TempSrc:    Axillary  SpO2: 94% 91% 90% 95%  Weight:      Height:       CBC:  Recent Labs  Lab 04/05/20 1049 04/05/20 1049 04/05/20 1056 04/07/20 0421  WBC 9.0  --   --  9.3  NEUTROABS 5.2  --   --   --   HGB 16.0   < > 16.3 13.1  HCT 46.7   < > 48.0 39.9  MCV 84.8  --   --  86.9  PLT 205  --   --  174   < > = values in this interval not displayed.   Basic Metabolic Panel:  Recent Labs  Lab 04/05/20 1049 04/05/20 1049 04/05/20 1056 04/06/20 0613 04/07/20 0421  NA 136   < > 137  --  142  K 3.4*   < > 3.3*  --  3.0*  CL 99   < > 99  --  106  CO2 21*  --   --   --  23  GLUCOSE 351*   < > 356*  --  136*  BUN 13   < > 15  --  7*  CREATININE 1.69*   < > 1.60*  --  1.12  CALCIUM 9.5  --   --   --  8.5*  MG  --   --   --  1.7  --   PHOS  --   --   --  2.7  --    < > = values in this interval not displayed.   Lipid Panel:  Recent Labs  Lab 04/06/20 0613 04/06/20 0613 04/07/20 0421  CHOL 206*  --   --   TRIG 550*   < > 261*  HDL 31*  --   --   CHOLHDL 6.6  --   --   VLDL UNABLE TO CALCULATE IF TRIGLYCERIDE OVER 400 mg/dL  --   --   LDLCALC UNABLE TO CALCULATE IF TRIGLYCERIDE OVER 400 mg/dL  --   --    < > = values in this interval not displayed.   HgbA1c:  Recent Labs  Lab 04/06/20 0613  HGBA1C 13.5*   Urine Drug Screen:  Recent Labs  Lab 04/05/20 1956  LABOPIA NONE DETECTED  COCAINSCRNUR POSITIVE*  LABBENZ NONE DETECTED  AMPHETMU NONE DETECTED  THCU NONE DETECTED   LABBARB NONE DETECTED    Alcohol Level  Recent Labs  Lab 04/05/20 1049  ETH <10    IMAGING past 24 hours MR ANGIO HEAD WO CONTRAST  Result Date: 04/06/2020 CLINICAL DATA:  Stroke post thrombectomy and stent placement EXAM: MRI HEAD WITHOUT CONTRAST MRA HEAD WITHOUT CONTRAST TECHNIQUE: Multiplanar, multiecho pulse sequences of the brain and surrounding structures were obtained without intravenous contrast. Angiographic images of the head were obtained using MRA technique without contrast. COMPARISON:  Prior CT imaging FINDINGS: MRI HEAD Brain: There is restricted diffusion in the right MCA  territory involving temporal, frontal, and parietal lobes as well as insula. There is some extension to the lateral right occipital lobe and there is involvement of the basal ganglia. Minimal involvement of right ACA territory is also noted with foci of restricted diffusion in the parasagittal frontal lobe. There is minimal corresponding susceptibility noted for example along the right body of caudate and in the right parietal lobe reflecting petechial hemorrhage. There is no significant mass effect. No hydrocephalus. Prominence of ventricles and sulci reflects generalized parenchymal volume loss. There are chronic small vessel infarcts of the central white matter and deep gray nuclei bilaterally as well as the right cerebellar hemisphere. Vascular: Major vessel flow voids at the skull base are preserved. There is susceptibility along the right M1 MCA related to stent placement. Skull and upper cervical spine: Normal marrow signal is preserved. Sinuses/Orbits: Minor mucosal thickening.  Orbits are unremarkable. Other: Sella is unremarkable.  Mastoid air cells are clear. MRA HEAD Motion artifact is present with findings within this limitation. Intracranial internal carotid arteries are patent. There is loss of flow related enhancement along the mid to distal right M1 MCA secondary to susceptibility artifact from stent  placement. Right middle cerebral artery otherwise appears patent. Both anterior cerebral and left middle cerebral arteries are patent. Intracranial vertebral arteries, basilar artery, posterior cerebral arteries are patent. Right posterior communicating artery is present and patent. IMPRESSION: Evolving acute right MCA territory infarction involving basal ganglia, insula, and temporal, frontal, and parietal lobes. Minimal petechial hemorrhage. No significant mass effect. Interval right M1 MCA stent placement with associated artifact appears patent. Electronically Signed   By: Macy Mis M.D.   On: 04/06/2020 11:37   MR BRAIN WO CONTRAST  Result Date: 04/06/2020 CLINICAL DATA:  Stroke post thrombectomy and stent placement EXAM: MRI HEAD WITHOUT CONTRAST MRA HEAD WITHOUT CONTRAST TECHNIQUE: Multiplanar, multiecho pulse sequences of the brain and surrounding structures were obtained without intravenous contrast. Angiographic images of the head were obtained using MRA technique without contrast. COMPARISON:  Prior CT imaging FINDINGS: MRI HEAD Brain: There is restricted diffusion in the right MCA territory involving temporal, frontal, and parietal lobes as well as insula. There is some extension to the lateral right occipital lobe and there is involvement of the basal ganglia. Minimal involvement of right ACA territory is also noted with foci of restricted diffusion in the parasagittal frontal lobe. There is minimal corresponding susceptibility noted for example along the right body of caudate and in the right parietal lobe reflecting petechial hemorrhage. There is no significant mass effect. No hydrocephalus. Prominence of ventricles and sulci reflects generalized parenchymal volume loss. There are chronic small vessel infarcts of the central white matter and deep gray nuclei bilaterally as well as the right cerebellar hemisphere. Vascular: Major vessel flow voids at the skull base are preserved. There is  susceptibility along the right M1 MCA related to stent placement. Skull and upper cervical spine: Normal marrow signal is preserved. Sinuses/Orbits: Minor mucosal thickening.  Orbits are unremarkable. Other: Sella is unremarkable.  Mastoid air cells are clear. MRA HEAD Motion artifact is present with findings within this limitation. Intracranial internal carotid arteries are patent. There is loss of flow related enhancement along the mid to distal right M1 MCA secondary to susceptibility artifact from stent placement. Right middle cerebral artery otherwise appears patent. Both anterior cerebral and left middle cerebral arteries are patent. Intracranial vertebral arteries, basilar artery, posterior cerebral arteries are patent. Right posterior communicating artery is present and patent. IMPRESSION: Evolving  acute right MCA territory infarction involving basal ganglia, insula, and temporal, frontal, and parietal lobes. Minimal petechial hemorrhage. No significant mass effect. Interval right M1 MCA stent placement with associated artifact appears patent. Electronically Signed   By: Macy Mis M.D.   On: 04/06/2020 11:37   ECHOCARDIOGRAM COMPLETE  Result Date: 04/06/2020    ECHOCARDIOGRAM REPORT   Patient Name:   Rodney Estrada Date of Exam: 04/06/2020 Medical Rec #:  [id number]    Height:       69.0 in Accession #:    [id number]   Weight:       219.1 lb Date of Birth:  1951-06-16     BSA:          2.148 m Patient Age:    69 years     BP:           126/111 mmHg Patient Gender: M            HR:           89 bpm. Exam Location:  Inpatient Procedure: 2D Echo Indications:    stroke 434.91  History:        Patient has no prior history of Echocardiogram examinations.                 Arrythmias:PVC; Risk Factors:Hypertension, Current Smoker and                 Sleep Apnea.  Sonographer:    Johny Chess Referring Phys: [id number] [place] Comments: Image acquisition challenging due to uncooperative patient  and Image acquisition challenging due to respiratory motion. IMPRESSIONS  1. Left ventricular ejection fraction, by estimation, is 50 to 55%. The left ventricle has low normal function. The left ventricle has no regional wall motion abnormalities. Left ventricular diastolic parameters are consistent with Grade II diastolic dysfunction (pseudonormalization).  2. Right ventricular systolic function is normal. The right ventricular size is normal. Tricuspid regurgitation signal is inadequate for assessing PA pressure.  3. Left atrial size was moderately dilated.  4. The mitral valve is normal in structure. Mild mitral valve regurgitation. No evidence of mitral stenosis.  5. The aortic valve is grossly normal. Aortic valve regurgitation is not visualized. Mild aortic valve sclerosis is present, with no evidence of aortic valve stenosis.  6. The inferior vena cava is dilated in size with >50% respiratory variability, suggesting right atrial pressure of 8 mmHg. Comparison(s): No prior Echocardiogram. FINDINGS  Left Ventricle: Left ventricular ejection fraction, by estimation, is 50 to 55%. The left ventricle has low normal function. The left ventricle has no regional wall motion abnormalities. The left ventricular internal cavity size was normal in size. There is borderline left ventricular hypertrophy. Left ventricular diastolic parameters are consistent with Grade II diastolic dysfunction (pseudonormalization). Right Ventricle: The right ventricular size is normal. No increase in right ventricular wall thickness. Right ventricular systolic function is normal. Tricuspid regurgitation signal is inadequate for assessing PA pressure. Left Atrium: Left atrial size was moderately dilated. Right Atrium: Right atrial size was normal in size. Pericardium: Trivial pericardial effusion is present. Mitral Valve: The mitral valve is normal in structure. Mild mitral valve regurgitation. No evidence of mitral valve stenosis. Tricuspid  Valve: The tricuspid valve is normal in structure. Tricuspid valve regurgitation is trivial. Aortic Valve: The aortic valve is grossly normal. Aortic valve regurgitation is not visualized. Mild aortic valve sclerosis is present, with no evidence of aortic valve stenosis. There is mild calcification of the  aortic valve. Pulmonic Valve: The pulmonic valve was not well visualized. Pulmonic valve regurgitation is trivial. No evidence of pulmonic stenosis. Aorta: The aortic root, ascending aorta and aortic arch are all structurally normal, with no evidence of dilitation or obstruction. Venous: The inferior vena cava is dilated in size with greater than 50% respiratory variability, suggesting right atrial pressure of 8 mmHg. IAS/Shunts: The atrial septum is grossly normal.  LEFT VENTRICLE PLAX 2D LVIDd:         5.00 cm  Diastology LVIDs:         3.70 cm  LV e' lateral:   9.00 cm/s LV PW:         1.20 cm  LV E/e' lateral: 13.8 LV IVS:        1.10 cm  LV e' medial:    5.00 cm/s LVOT diam:     2.10 cm  LV E/e' medial:  24.8 LV SV:         56 LV SV Index:   26 LVOT Area:     3.46 cm  RIGHT VENTRICLE             IVC RV S prime:     17.00 cm/s  IVC diam: 2.50 cm TAPSE (M-mode): 1.9 cm LEFT ATRIUM             Index       RIGHT ATRIUM           Index LA diam:        4.40 cm 2.05 cm/m  RA Area:     18.60 cm LA Vol (A2C):   81.8 ml 38.09 ml/m RA Volume:   51.80 ml  24.12 ml/m LA Vol (A4C):   87.3 ml 40.65 ml/m LA Biplane Vol: 86.7 ml 40.37 ml/m  AORTIC VALVE LVOT Vmax:   97.40 cm/s LVOT Vmean:  70.400 cm/s LVOT VTI:    0.163 m  AORTA Ao Root diam: 3.30 cm Ao Asc diam:  3.40 cm MITRAL VALVE MV Area (PHT): 3.65 cm     SHUNTS MV Decel Time: 208 msec     Systemic VTI:  0.16 m MV E velocity: 124.00 cm/s  Systemic Diam: 2.10 cm MV A velocity: 94.10 cm/s MV E/A ratio:  1.32 Buford Dresser MD Electronically signed by Buford Dresser MD Signature Date/Time: 04/06/2020/6:38:24 PM    Final     PHYSICAL EXAM  Temp:   [98.2 F (36.8 C)-99.3 F (37.4 C)] 98.9 F (37.2 C) (07/14 0800) Pulse Rate:  [77-99] 87 (07/14 0800) Resp:  [11-25] 12 (07/14 0800) BP: (116-178)/(73-115) 155/115 (07/14 0800) SpO2:  [90 %-98 %] 95 % (07/14 0800) Arterial Line BP: (136-176)/(49-73) 176/73 (07/13 1300)  General - Well nourished, well developed, lethargic.  Ophthalmologic - fundi not visualized due to noncooperation.  Cardiovascular - Regular rhythm and rate.  Neuro - lethargic but eyes halfway open on voice, severe dysarthria, no aphasia, following commands. Able to name and repeat but with very dysarthric voice. Orientated to self, age and time. Right gaze preference but able to have left gaze, blinking to visual threat bilaterally. Left facial droop. Tongue midline. Left UE flaccid. Left LE mid withdraw to pain. RUE and RLE 5/5. Left babinski positive. Sensation symmetrical subjectively. Right FTN intact. Gait not tested.  ASSESSMENT/PLAN [name] is a 69 y.o. male with history of tobacco use presenting with L sided flaccidity, R gaze, L facial droop, periods of apnea in setting of BP 260/140. IV tPA administered 04/05/2020 at 1116.  Taken to IR for R M1 occlusion.   Stroke: R MCA infarct s/p tPA and evt w/ complete revascularization of R M1 following angioplasty w/ stent placement, infarct secondary to large vessel disease  Code Stroke CT head No acute abnormality. Numerous chronic lacunes B basal ganglia, R thalamic, white matter. ASPECTS 10.     CTA head R M1 occlusion. Intracranial B ICA moderate stenosis. B P2 w/ moderate stenoses.  CTA neck proximal L ICA 40-50% stenosis. R ICA bifurcation & proximal w/ mixed plaque. Mild to moderate distal cervical R ICA. L VA origin moderate stenosis.  Cerebral angio R M1 occlusion s/p EVT and R M1 stent placed w/ complete revascularization     CT head 7/12 1715 developing R MCA infarct. Mild hyperdensity R putamen, ? Contrast vs hemorrhage.   MRI  Evolving R large  MCA infarct (basal ganglia, insula, temporal, frontal lobes). Minimal petechial hemorrhage.   MRA  interval R M1 stent patent  CT repeat in am  2D Echo EF 50-55%  TG 550->261, direct LDL 108.2   HgbA1c 13.5  VTE prophylaxis - lovenox  No antithrombotic prior to admission, treated w/ cangrelor post IR, loaded w/ Brilinta 180 and aspirin post op, now on aspirin 81 mg daily and Brilinta (ticagrelor) 90 mg bid.   Therapy recommendations:  CIR   Disposition:  pending   Hypertensive Emergency  BP as high as 260/140 on admission   Home meds:  None  Treated w max Cleviprex post IR for Post IR tight BP control -> now off   now SBP goal < 180/105  On amlodipine 10  On hydralazine PRN . Long-term BP goal normotensive  Hyperlipidemia  Home meds:  None  Now on lipitor 80  TG 550->261, direct LDL 108.2, goal < 70  Continue statin at discharge  Diabetes type II Uncontrolled w/ Hyperglycemia  Home meds:  none  HgbA1c 13.5, goal < 7.0  CBGs  SSI 0-9 q4h  DB RN recommends add levemir 12u bid   Will follow for d/c recommendations.  Dysphagia . Secondary to stroke . NPO . Speech on board . Transition to cortrak and start TF . Decreased IV to 40/h   Tobacco abuse  Current smoker  Smoking cessation counseling will be provided  Nicotine patch 14 mg daily added  Pt is willing to quit    Alcohol abuse  Heavy user as per sister  alcohol level <10  CIWA protocol added  Received 2 doses of Ativan overnight  advised to drink no more than 2 drink(s) a day  Close monitor for alcohol withdraw  Cocaine abuse  Pt admitted using cocaine  Cessation education provided  Pt is willing to quit  Avoid beta blocker at this time  Other Stroke Risk Factors  Advanced age  Obesity, Body mass index is 32.36 kg/m., recommend weight loss, diet and exercise as appropriate   Other Active Problems  Hypokalemia, K 3.4->3.3-> 3.0 - supplemented - f/u am  (phosphorus and magnesium normal)   AKI 1.69->1.60->1.12 - on IVF, improved   Hypomagnesemia, Mg 1.4 - supplemented  Hospital day # 2   Rosalin Hawking, MD PhD Stroke Neurology 04/07/2020 9:06 AM   To contact Stroke Continuity provider, please refer to [email or web address] After hours, contact General Neurology

## 2020-04-08 ENCOUNTER — Inpatient Hospital Stay (HOSPITAL_COMMUNITY): Payer: 59

## 2020-04-08 DIAGNOSIS — I63511 Cerebral infarction due to unspecified occlusion or stenosis of right middle cerebral artery: Principal | ICD-10-CM

## 2020-04-08 DIAGNOSIS — Z7289 Other problems related to lifestyle: Secondary | ICD-10-CM

## 2020-04-08 DIAGNOSIS — R1312 Dysphagia, oropharyngeal phase: Secondary | ICD-10-CM

## 2020-04-08 LAB — CBC
HCT: 41.8 % (ref 39.0–52.0)
Hemoglobin: 13.6 g/dL (ref 13.0–17.0)
MCH: 28.5 pg (ref 26.0–34.0)
MCHC: 32.5 g/dL (ref 30.0–36.0)
MCV: 87.6 fL (ref 80.0–100.0)
Platelets: 190 10*3/uL (ref 150–400)
RBC: 4.77 MIL/uL (ref 4.22–5.81)
RDW: 13.9 % (ref 11.5–15.5)
WBC: 8.8 10*3/uL (ref 4.0–10.5)
nRBC: 0 % (ref 0.0–0.2)

## 2020-04-08 LAB — BASIC METABOLIC PANEL
Anion gap: 11 (ref 5–15)
BUN: 9 mg/dL (ref 8–23)
CO2: 23 mmol/L (ref 22–32)
Calcium: 8.8 mg/dL — ABNORMAL LOW (ref 8.9–10.3)
Chloride: 107 mmol/L (ref 98–111)
Creatinine, Ser: 1.14 mg/dL (ref 0.61–1.24)
GFR calc Af Amer: >60 mL/min (ref >60)
GFR calc non Af Amer: >60 mL/min (ref >60)
Glucose, Bld: 255 mg/dL — ABNORMAL HIGH (ref 70–99)
Potassium: 3.8 mmol/L (ref 3.5–5.1)
Sodium: 141 mmol/L (ref 135–145)

## 2020-04-08 LAB — GLUCOSE, CAPILLARY
Glucose-Capillary: 181 mg/dL — ABNORMAL HIGH (ref 70–99)
Glucose-Capillary: 204 mg/dL — ABNORMAL HIGH (ref 70–99)
Glucose-Capillary: 228 mg/dL — ABNORMAL HIGH (ref 70–99)
Glucose-Capillary: 234 mg/dL — ABNORMAL HIGH (ref 70–99)
Glucose-Capillary: 235 mg/dL — ABNORMAL HIGH (ref 70–99)
Glucose-Capillary: 259 mg/dL — ABNORMAL HIGH (ref 70–99)
Glucose-Capillary: 273 mg/dL — ABNORMAL HIGH (ref 70–99)

## 2020-04-08 LAB — PHOSPHORUS
Phosphorus: 2.7 mg/dL (ref 2.5–4.6)
Phosphorus: 2.8 mg/dL (ref 2.5–4.6)

## 2020-04-08 LAB — MAGNESIUM
Magnesium: 1.8 mg/dL (ref 1.7–2.4)
Magnesium: 1.9 mg/dL (ref 1.7–2.4)

## 2020-04-08 MED ORDER — INSULIN ASPART 100 UNIT/ML ~~LOC~~ SOLN
4.0000 [IU] | SUBCUTANEOUS | Status: DC
  Administered 2020-04-08 – 2020-04-14 (×35): 4 [IU] via SUBCUTANEOUS

## 2020-04-08 MED ORDER — LISINOPRIL 20 MG PO TABS
20.0000 mg | ORAL_TABLET | Freq: Every day | ORAL | Status: DC
  Administered 2020-04-08 – 2020-04-18 (×9): 20 mg via ORAL
  Filled 2020-04-08 (×11): qty 1

## 2020-04-08 NOTE — Progress Notes (Addendum)
Shared the results of the patient's CT scan of the head this am while MD was rounding on the unit

## 2020-04-08 NOTE — Progress Notes (Addendum)
STROKE TEAM PROGRESS NOTE   INTERVAL HISTORY No family at bedside. Pt sister called in but did not leave contact number. There is no number in the chart for her sister to be updated on. Pt lying in bed, still severe dysarthria but more awake than yesterday, still has left hemiplegia but LLE now 2/5. Left UE flaccid. Pt has [place] breathing pattern, likely OSA. Repeat CT showed right MCA large infarct with mild mass effect. Right MCA likely occluded.   Vitals:   04/08/20 0525 04/08/20 0833 04/08/20 1137 04/08/20 1153  BP:  (!) 173/93 (!) 174/117 140/89  Pulse: 79 80  93  Resp:  16  16  Temp:  99.6 F (37.6 C)  98.5 F (36.9 C)  TempSrc:  Axillary  Oral  SpO2:  97%  97%  Weight:      Height:       CBC:  Recent Labs  Lab 04/05/20 1049 04/05/20 1056 04/07/20 0421 04/08/20 0744  WBC 9.0   < > 9.3 8.8  NEUTROABS 5.2  --   --   --   HGB 16.0   < > 13.1 13.6  HCT 46.7   < > 39.9 41.8  MCV 84.8   < > 86.9 87.6  PLT 205   < > 174 190   < > = values in this interval not displayed.   Basic Metabolic Panel:  Recent Labs  Lab 04/07/20 0421 04/07/20 1003 04/07/20 1802 04/08/20 0744  NA 142  --   --  141  K 3.0*  --   --  3.8  CL 106  --   --  107  CO2 23  --   --  23  GLUCOSE 136*  --   --  255*  BUN 7*  --   --  9  CREATININE 1.12  --   --  1.14  CALCIUM 8.5*  --   --  8.8*  MG  --    < > 2.1 1.8  PHOS  --    < > 2.5 2.7   < > = values in this interval not displayed.   Lipid Panel:  Recent Labs  Lab 04/06/20 0613 04/06/20 0613 04/07/20 0421  CHOL 206*  --   --   TRIG 550*   < > 261*  HDL 31*  --   --   CHOLHDL 6.6  --   --   VLDL UNABLE TO CALCULATE IF TRIGLYCERIDE OVER 400 mg/dL  --   --   LDLCALC UNABLE TO CALCULATE IF TRIGLYCERIDE OVER 400 mg/dL  --   --    < > = values in this interval not displayed.   HgbA1c:  Recent Labs  Lab 04/06/20 0613  HGBA1C 13.5*   Urine Drug Screen:  Recent Labs  Lab 04/05/20 1956  LABOPIA NONE DETECTED  COCAINSCRNUR  POSITIVE*  LABBENZ NONE DETECTED  AMPHETMU NONE DETECTED  THCU NONE DETECTED  LABBARB NONE DETECTED    Alcohol Level  Recent Labs  Lab 04/05/20 1049  ETH <10    IMAGING past 24 hours CT HEAD WO CONTRAST  Result Date: 04/08/2020 CLINICAL DATA:  Stroke, follow-up. EXAM: CT HEAD WITHOUT CONTRAST TECHNIQUE: Contiguous axial images were obtained from the base of the skull through the vertex without intravenous contrast. COMPARISON:  MRI/MRA head 04/06/2020, head CT 04/05/2020 FINDINGS: Brain: Cerebral volume is normal. Again demonstrated are extensive changes of subacute ischemic infarction within the right MCA vascular territory, within the right frontal, parietal,  temporal and lateral right occipito lobes as well as right insula and subinsular region and right basal ganglia. The infarct is not appear significantly changed in extent as compared to the MRI of 04/06/2020. As before, there is mild associated petechial hemorrhage. There is regional mass effect with partial effacement of the right temporal horn. No midline shift. Stable background chronic small vessel ischemic disease. No extra-axial fluid collection. No evidence of intracranial mass. No midline shift. Vascular: Right M1 MCA stent. There is asymmetric hyperdensity of M2 branch vessels within the right sylvian fissure. Skull: Normal. Negative for fracture or focal lesion. Sinuses/Orbits: Visualized orbits show no acute finding. Small right maxillary sinus mucous retention cyst. Partially imaged nasoenteric tube. No significant mastoid effusion. Impression #2 will be called to the ordering clinician or representative by the Radiologist Assistant, and communication documented in the PACS or [employer]. IMPRESSION: Extensive subacute ischemic infarction changes within the right MCA vascular territory, not significantly changed in extent as compared to the MRI of 04/06/2020. Region mass effect with partial effacement of the right temporal  horn. No midline shift. As before, there is mild associated petechial hemorrhage. Redemonstrated M1 right middle cerebral artery stent. There is asymmetric hyperdensity of M2 MCA vessels within the right sylvian fissure and thrombus within these vessels cannot be excluded. Stable background chronic small vessel ischemic disease. Electronically Signed   By: Kellie Simmering DO   On: 04/08/2020 10:25    PHYSICAL EXAM   Temp:  [98.2 F (36.8 C)-99.7 F (37.6 C)] 98.5 F (36.9 C) (07/15 1153) Pulse Rate:  [79-93] 93 (07/15 1153) Resp:  [11-20] 16 (07/15 1153) BP: (140-176)/(86-117) 140/89 (07/15 1153) SpO2:  [94 %-100 %] 97 % (07/15 1153) Weight:  [99.3 kg] 99.3 kg (07/15 0440)  General - Well nourished, well developed, not in acute distress.  Ophthalmologic - fundi not visualized due to noncooperation.  Cardiovascular - Regular rhythm and rate.  Neuro - awake alert eyes open, severe dysarthria, no aphasia, following commands. Able to name and repeat but with very dysarthric voice. Orientated to self, age and time. Asking for her sister and his friend. Right gaze preference but able to have left gaze although incomplete, blinking to visual threat on the right, inconsistent on the left. Left facial droop. Tongue midline. Left UE flaccid. Left LE 2/5 proximal and 0/5 distal. RUE and RLE 5/5. Left babinski positive. Sensation symmetrical subjectively. Right FTN intact. Gait not tested.   ASSESSMENT/PLAN Mr. Lauren Aguayo is a 69 y.o. male with history of tobacco use presenting with L sided flaccidity, R gaze, L facial droop, periods of apnea in setting of BP 260/140. IV tPA administered 04/05/2020 at 1116. Taken to IR for R M1 occlusion.   Stroke: R MCA infarct s/p tPA and evt w/ complete revascularization of R M1 following angioplasty w/ stent placement, infarct secondary to large vessel disease  Code Stroke CT head No acute abnormality. Numerous chronic lacunes B basal ganglia, R thalamic, white  matter. ASPECTS 10.     CTA head R M1 occlusion. Intracranial B ICA moderate stenosis. B P2 w/ moderate stenoses.  CTA neck proximal L ICA 40-50% stenosis. R ICA bifurcation & proximal w/ mixed plaque. Mild to moderate distal cervical R ICA. L VA origin moderate stenosis.  Cerebral angio R M1 occlusion s/p EVT and R M1 stent placed w/ complete revascularization     CT head 7/12 1715 developing R MCA infarct. Mild hyperdensity R putamen, ? Contrast vs hemorrhage.   MRI  Evolving R large MCA infarct (basal ganglia, insula, temporal, frontal lobes). Minimal petechial hemorrhage.   MRA  interval R M1 stent patent  CT repeat 7/15 stable extensive R MCA infarct w/ regional mass effect and partial effacement R temporal horn, mild petechial hemorrhage. R M1 MCA stent, asymmetric hyperdense R M2.  2D Echo EF 50-55%  TG 550->261, direct LDL 108.2   HgbA1c 13.5  VTE prophylaxis - lovenox  No antithrombotic prior to admission, treated w/ cangrelor post IR, loaded w/ Brilinta 180 and aspirin post op, now on aspirin 81 mg daily and Brilinta (ticagrelor) 90 mg bid.   Therapy recommendations:  CIR   Disposition:  pending   Cerebral edema  CT 7/15 showed large right MCA infarct with regional mass-effect  No significant midline shift yet  We will need close monitoring  We will repeat a CT, CTA head and neck in 2 days  Hypertensive Emergency  BP as high as 260/140 on admission   Home meds:  None  Treated w max Cleviprex post IR for Post IR tight BP control -> now off   now SBP goal < 180/105  On amlodipine 10  Add lisinopril 20  On hydralazine PRN . Long-term BP goal normotensive  Hyperlipidemia  Home meds:  None  Now on lipitor 80  TG 550->261, direct LDL 108.2, goal < 70  Continue statin at discharge  Diabetes type II Uncontrolled w/ Hyperglycemia  Home meds:  none  HgbA1c 13.5, goal < 7.0  CBGs  SSI 0-9 q4h  DB RN recommends add levemir 12u bid, Novolog 4u  q4h TF coverage (added)  Dysphagia . Secondary to stroke . NPO . Speech on board . On cortrak and TF @ 77 . Decreased IV to 20/h   Tobacco abuse  Current smoker  Smoking cessation counseling will be provided  Nicotine patch 14 mg daily added  Pt is willing to quit    Alcohol abuse  Heavy user as per sister  alcohol level <10  CIWA protocol   advised to drink no more than 2 drink(s) a day  Close monitor for alcohol withdraw  Cocaine abuse  Pt admitted using cocaine  Cessation education provided  Pt is willing to quit  Avoid beta blocker at this time  Other Stroke Risk Factors  Advanced age  Obesity, Body mass index is 32.34 kg/m., recommend weight loss, diet and exercise as appropriate   Other Active Problems  Hypokalemia, K 3.4->3.3->3.0-> - supplemented - 3.8   AKI 1.69->1.60->1.12-<1.14 - on IVF and TF, improved   Hypomagnesemia, Mg 1.4 - supplemented - 1.8 - resolved  Hospital day # 3  Tried to update sister but her number is not in chart. RN did not record pt sister number when she called in earlier today.   Patient condition worsened within the last 24 hours, has developed large right MCA infarct with mass-effect, continues to have left hemiplegia, severe dysarthria and dysphagia and alcohol withdrawal watch, and I increase BP meds and plan to repeat CT in 2 days. I spent  35 minutes in total face-to-face time with the patient, more than 50% of which was spent in counseling and coordination of care, reviewing test results, images and medication, and discussing the diagnosis, treatment plan and potential prognosis. This patient's care requiresreview of multiple databases, neurological assessment, discussion with family, other specialists and medical decision making of high complexity.   Rosalin Hawking, MD PhD Stroke Neurology 04/08/2020 2:16 PM   To contact Stroke Continuity provider,  please refer to [email or web address] After hours, contact General  Neurology

## 2020-04-08 NOTE — Progress Notes (Signed)
Physical Therapy Treatment [name] MRN: [id number] DOB: Sep 27, 1950 Today's Date: 04/08/2020    History of Present Illness Patient is a 69 y/o male who presents with acute CVA s/p cerebral arteriogram with emergent mechanical thrombectomy of right MCA M1 occlusion along with stent assisted angioplasty of right MCA M1 04/05/20. s/p tpA. + cocaine. 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 No PMH.    PT Comments    Pt received sleeping in bed. Awakes easily and participates in therapy, but falling asleep without continuous cueing/stimulation. Pt required +2 max/total assist bed mobility and max assist to maintain sitting balance EOB. Pt demonstrates difficulty managing secretions, drooling while sitting EOB. He became tearful during session when speaking of him and his mother having the same birthday. Pt returned to supine at end of session. BP 166/122. RN in room and aware.    Follow Up Recommendations  CIR     Equipment Recommendations  Wheelchair (measurements PT);Wheelchair cushion (measurements PT);Hospital bed;Other (comment)    Recommendations for Other Services       Precautions / Restrictions Precautions Precautions: Fall;Other (comment) Precaution Comments: NG tube; SBP <180    Mobility  Bed Mobility Overal bed mobility: Needs Assistance Bed Mobility: Supine to Sit;Sit to Supine     Supine to sit: +2 for physical assistance;Total assist Sit to supine: Max assist;+2 for physical assistance   General bed mobility comments: helicopter method using bed pad for transition from supine to sit. Cues needed to keep eyes open.  Transfers                    Ambulation/Gait                 Stairs             Wheelchair Mobility    Modified Rankin (Stroke Patients Only) Modified Rankin (Stroke Patients Only) Pre-Morbid Rankin Score: No  symptoms Modified Rankin: Severe disability     Balance Overall balance assessment: Needs assistance Sitting-balance support: Bilateral upper extremity supported;Feet supported Sitting balance-Leahy Scale: Zero Sitting balance - Comments: max asssit to maintain sitting balance EOB. Heavy left/posterior lean/push Postural control: Posterior lean;Left lateral lean                                  Cognition Arousal/Alertness: Awake/alert (falling asleep without continuous stimulation) Behavior During Therapy: Flat affect Overall Cognitive Status: Impaired/Different from baseline Area of Impairment: Attention;Memory;Following commands;Safety/judgement;Awareness;Problem solving                   Current Attention Level: Focused Memory: Decreased recall of precautions;Decreased short-term memory Following Commands: Follows one step commands with increased time Safety/Judgement: Decreased awareness of safety;Decreased awareness of deficits Awareness: Intellectual Problem Solving: Slow processing;Requires verbal cues;Requires tactile cues;Decreased initiation General Comments: Alert and participating but falling asleep without continuous stimulation.      Exercises      General Comments General comments (skin integrity, edema, etc.): BP 154/102 prior to mobilization. Upon return to supine at end of session, BP 166/122. Second reading taken to confirm and BP was 174/117. RN present in room and aware.      Pertinent Vitals/Pain Pain Assessment: Faces Faces Pain Scale: No hurt    Home Living  Prior Function            PT Goals (current goals can now be found in the care plan section) Acute Rehab PT Goals Patient Stated Goal: not stated Progress towards PT goals: Progressing toward goals    Frequency    Min 4X/week      PT Plan Current plan remains appropriate    Co-evaluation              AM-PAC PT "6 Clicks"  Mobility   Outcome Measure  Help needed turning from your back to your side while in a flat bed without using bedrails?: Total Help needed moving from lying on your back to sitting on the side of a flat bed without using bedrails?: Total Help needed moving to and from a bed to a chair (including a wheelchair)?: Total Help needed standing up from a chair using your arms (e.g., wheelchair or bedside chair)?: Total Help needed to walk in hospital room?: Total Help needed climbing 3-5 steps with a railing? : Total 6 Click Score: 6    End of Session   Activity Tolerance: Treatment limited secondary to medical complications (Comment) (increased BP) Patient left: in bed;with call bell/phone within reach;with nursing/sitter in room Nurse Communication: Mobility status PT Visit Diagnosis: Hemiplegia and hemiparesis;Difficulty in walking, not elsewhere classified (R26.2);Unsteadiness on feet (R26.81) Hemiplegia - Right/Left: Left Hemiplegia - dominant/non-dominant: Non-dominant Hemiplegia - caused by: Cerebral infarction     Time: 9485-4627 PT Time Calculation (min) (ACUTE ONLY): 18 min  Charges:  $Therapeutic Activity: 8-22 mins                     Lorrin Goodell, PT  Office # [phone] Pager [phone]    Lorriane Shire 04/08/2020, 12:36 PM

## 2020-04-08 NOTE — Consult Note (Signed)
Physical Medicine and Rehabilitation Consult Reason for Consult: Left side weakness Referring Physician: Dr.Xu   HPI: Rodney Estrada is a 69 y.o. right-handed male with history of tobacco abuse on no prescription medications.  Per chart review patient independent prior to admission working full-time.  Presented 04/05/2020 with right gaze deviation left side weakness, dysarthria and facial droop with periods of apnea.  Noted blood pressure 260/140.  Cranial CT scan showed no evidence of acute infarction or hemorrhage.  Patient did receive TPA.  CT angiogram of head and neck showed the left common and internal carotid arteries patent within the 40 to 50% atherosclerotic narrowing of the proximal left ICA.  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  Admission chemistries alcohol negative, potassium 3.4, glucose 351, creatinine 1.69, urine drug screen positive cocaine, hemoglobin A1c 13.5.   Patient underwent complete revascularization of right M1 with stenting per interventional radiology.  MRI showed evolving acute right MCA territory infarction involving basal ganglia and insula and temporal frontal and parietal lobes with minimal petechial hemorrhage.  Echocardiogram with ejection fraction of 50 to 55% without emboli.  Maintained on aspirin as well as Brilinta.  Subcutaneous Lovenox for DVT prophylaxis.  Patient currently is n.p.o. with alternative means of nutritional support.  Therapy evaluations completed with recommendations of physical medicine rehab consult.   Review of Systems  Constitutional: Negative for chills and fever.  HENT: Negative for hearing loss.   Eyes: Negative for blurred vision and double vision.  Respiratory: Negative for cough and shortness of breath.   Cardiovascular: Negative for palpitations.  Gastrointestinal: Positive for constipation. Negative for heartburn, nausea and vomiting.  Genitourinary: Negative for flank pain and hematuria.   Musculoskeletal: Positive for myalgias.  Skin: Negative for rash.  Neurological: Positive for speech change, weakness and headaches.  All other systems reviewed and are negative.  History reviewed. No pertinent past medical history. Past Surgical History:  Procedure Laterality Date  . IR CT HEAD LTD  04/05/2020  . IR INTRA CRAN STENT  04/05/2020  . IR PERCUTANEOUS ART THROMBECTOMY/INFUSION INTRACRANIAL INC DIAG ANGIO  04/05/2020      . IR PERCUTANEOUS ART THROMBECTOMY/INFUSION INTRACRANIAL INC DIAG ANGIO  04/05/2020  . RADIOLOGY WITH ANESTHESIA N/A 04/05/2020   Procedure: IR WITH ANESTHESIA;  Surgeon: Radiologist, Medication, MD;  Location: [facility];  Service: Radiology;  Laterality: N/A;   Family History  Problem Relation Age of Onset  . Hypertension Mother   . Hypertension Father    Social History:  reports that he has been smoking. He has never used smokeless tobacco. He reports current alcohol use of about 10.0 standard drinks of alcohol per week. He reports previous drug use. Allergies: No Known Allergies No medications prior to admission.    Home: Home Living Family/patient expects to be discharged to:: Private residence Additional Comments: Not able to gather home setup due to pt's poor arousal and attention. Will obtain next session.  Functional History: Prior Function Level of Independence: Independent Comments: Driving and working PTA. Functional Status:  Mobility: Bed Mobility Overal bed mobility: Needs Assistance Bed Mobility: Supine to Sit, Sit to Supine Supine to sit: +2 for physical assistance, Total assist Sit to supine: Max assist, +2 for physical assistance General bed mobility comments: helicopter method using bed pad for transition from supine to sit. Cues needed to keep eyes open. Transfers Overall transfer level: Needs assistance Equipment used: 2 person hand held assist Transfers: Sit to/from Stand, [employer] Transfers Sit  to Stand: Max assist, +2  physical assistance Stand pivot transfers: Max assist, +2 physical assistance General transfer comment: pt pushing toward left side in standing activities, heave maxAx2      ADL: ADL Overall ADL's : Needs assistance/impaired Grooming: Total assistance Upper Body Bathing: Total assistance Lower Body Bathing: Total assistance Upper Body Dressing : Total assistance Lower Body Dressing: Total assistance Toilet Transfer: Total assistance  Cognition: Cognition Overall Cognitive Status: Impaired/Different from baseline Arousal/Alertness: Lethargic Orientation Level: Oriented X4 Attention: Sustained Sustained Attention: Impaired Sustained Attention Impairment: Verbal basic, Functional basic Memory: Impaired Memory Impairment: Retrieval deficit, Storage deficit Awareness: Impaired Awareness Impairment: Intellectual impairment, Emergent impairment, Anticipatory impairment Problem Solving: Impaired Problem Solving Impairment: Verbal basic, Functional basic Safety/Judgment: Impaired Cognition Arousal/Alertness: Awake/alert (falling asleep without continuous stimulation) Behavior During Therapy: Flat affect Overall Cognitive Status: Impaired/Different from baseline Area of Impairment: Attention, Memory, Following commands, Safety/judgement, Awareness, Problem solving Current Attention Level: Focused Memory: Decreased recall of precautions, Decreased short-term memory Following Commands: Follows one step commands with increased time Safety/Judgement: Decreased awareness of safety, Decreased awareness of deficits Awareness: Intellectual Problem Solving: Slow processing, Requires verbal cues, Requires tactile cues, Decreased initiation General Comments: Alert and participating but falling asleep without continuous stimulation. Difficult to assess due to: Level of arousal  Blood pressure 140/89, pulse 93, temperature 98.5 F (36.9 C), temperature source Oral, resp. rate 16, height 5\' 9"   (1.753 m), weight 99.3 kg, SpO2 97 %. Physical Exam  General: Lethargic, No apparent distress HEENT: Head is normocephalic, atraumatic, oral mucosa pink and moist, NGT in place.  Neck: Supple without JVD or lymphadenopathy Heart: Reg rate and rhythm. No murmurs rubs or gallops Chest: CTA bilaterally without wheezes, rales, or rhonchi; no distress Abdomen: Soft, non-tender, non-distended, bowel sounds positive. Extremities: No clubbing, cyanosis, or edema. Pulses are 2+ Skin: Clean and intact without signs of breakdown Neuro: Patient is lethargic but arousable.  Slow processing. Decreased awareness. Severe dysarthria.  He does provide his name.  Noted right gaze preference.  Follows simple commands. LUE is flaccid. LLE withdraws to pain. Right sided strength appears to be intact. Psych: Pt's affect is lethargic. Pt is cooperative GU: Foley draining yellow urine  Results for orders placed or performed during the hospital encounter of 04/05/20 (from the past 24 hour(s))  Glucose, capillary     Status: Abnormal   Collection Time: 04/07/20  3:39 PM  Result Value Ref Range   Glucose-Capillary 172 (H) 70 - 99 mg/dL  Magnesium     Status: None   Collection Time: 04/07/20  6:02 PM  Result Value Ref Range   Magnesium 2.1 1.7 - 2.4 mg/dL  Phosphorus     Status: None   Collection Time: 04/07/20  6:02 PM  Result Value Ref Range   Phosphorus 2.5 2.5 - 4.6 mg/dL  Glucose, capillary     Status: Abnormal   Collection Time: 04/07/20  7:43 PM  Result Value Ref Range   Glucose-Capillary 181 (H) 70 - 99 mg/dL  Glucose, capillary     Status: Abnormal   Collection Time: 04/07/20 11:19 PM  Result Value Ref Range   Glucose-Capillary 183 (H) 70 - 99 mg/dL   Comment 1 Notify RN    Comment 2 Document in Chart   Glucose, capillary     Status: Abnormal   Collection Time: 04/07/20 11:22 PM  Result Value Ref Range   Glucose-Capillary 181 (H) 70 - 99 mg/dL  Glucose, capillary     Status: Abnormal  Collection Time: 04/08/20  3:54 AM  Result Value Ref Range   Glucose-Capillary 273 (H) 70 - 99 mg/dL   Comment 1 Notify RN    Comment 2 Document in Chart   CBC     Status: None   Collection Time: 04/08/20  7:44 AM  Result Value Ref Range   WBC 8.8 4.0 - 10.5 K/uL   RBC 4.77 4.22 - 5.81 MIL/uL   Hemoglobin 13.6 13.0 - 17.0 g/dL   HCT 41.8 39 - 52 %   MCV 87.6 80.0 - 100.0 fL   MCH 28.5 26.0 - 34.0 pg   MCHC 32.5 30.0 - 36.0 g/dL   RDW 13.9 11.5 - 15.5 %   Platelets 190 150 - 400 K/uL   nRBC 0.0 0.0 - 0.2 %  Basic metabolic panel     Status: Abnormal   Collection Time: 04/08/20  7:44 AM  Result Value Ref Range   Sodium 141 135 - 145 mmol/L   Potassium 3.8 3.5 - 5.1 mmol/L   Chloride 107 98 - 111 mmol/L   CO2 23 22 - 32 mmol/L   Glucose, Bld 255 (H) 70 - 99 mg/dL   BUN 9 8 - 23 mg/dL   Creatinine, Ser 1.14 0.61 - 1.24 mg/dL   Calcium 8.8 (L) 8.9 - 10.3 mg/dL   GFR calc non Af Amer >60 >60 mL/min   GFR calc Af Amer >60 >60 mL/min   Anion gap 11 5 - 15  Magnesium     Status: None   Collection Time: 04/08/20  7:44 AM  Result Value Ref Range   Magnesium 1.8 1.7 - 2.4 mg/dL  Phosphorus     Status: None   Collection Time: 04/08/20  7:44 AM  Result Value Ref Range   Phosphorus 2.7 2.5 - 4.6 mg/dL  Glucose, capillary     Status: Abnormal   Collection Time: 04/08/20  8:31 AM  Result Value Ref Range   Glucose-Capillary 235 (H) 70 - 99 mg/dL  Glucose, capillary     Status: Abnormal   Collection Time: 04/08/20 11:47 AM  Result Value Ref Range   Glucose-Capillary 204 (H) 70 - 99 mg/dL   CT HEAD WO CONTRAST  Result Date: 04/08/2020 CLINICAL DATA:  Stroke, follow-up. EXAM: CT HEAD WITHOUT CONTRAST TECHNIQUE: Contiguous axial images were obtained from the base of the skull through the vertex without intravenous contrast. COMPARISON:  MRI/MRA head 04/06/2020, head CT 04/05/2020 FINDINGS: Brain: Cerebral volume is normal. Again demonstrated are extensive changes of subacute ischemic  infarction within the right MCA vascular territory, within the right frontal, parietal, temporal and lateral right occipito lobes as well as right insula and subinsular region and right basal ganglia. The infarct is not appear significantly changed in extent as compared to the MRI of 04/06/2020. As before, there is mild associated petechial hemorrhage. There is regional mass effect with partial effacement of the right temporal horn. No midline shift. Stable background chronic small vessel ischemic disease. No extra-axial fluid collection. No evidence of intracranial mass. No midline shift. Vascular: Right M1 MCA stent. There is asymmetric hyperdensity of M2 branch vessels within the right sylvian fissure. Skull: Normal. Negative for fracture or focal lesion. Sinuses/Orbits: Visualized orbits show no acute finding. Small right maxillary sinus mucous retention cyst. Partially imaged nasoenteric tube. No significant mastoid effusion. Impression #2 will be called to the ordering clinician or representative by the Radiologist Assistant, and communication documented in the PACS or [employer]. IMPRESSION: Extensive subacute ischemic infarction  changes within the right MCA vascular territory, not significantly changed in extent as compared to the MRI of 04/06/2020. Region mass effect with partial effacement of the right temporal horn. No midline shift. As before, there is mild associated petechial hemorrhage. Redemonstrated M1 right middle cerebral artery stent. There is asymmetric hyperdensity of M2 MCA vessels within the right sylvian fissure and thrombus within these vessels cannot be excluded. Stable background chronic small vessel ischemic disease. Electronically Signed   By: Kellie Simmering DO   On: 04/08/2020 10:25   ECHOCARDIOGRAM COMPLETE  Result Date: 04/06/2020    ECHOCARDIOGRAM REPORT   Patient Name:   Rodney Estrada Date of Exam: 04/06/2020 Medical Rec #:  [id number]    Height:       69.0 in Accession #:     [id number]   Weight:       219.1 lb Date of Birth:  04-18-1951     BSA:          2.148 m Patient Age:    87 years     BP:           126/111 mmHg Patient Gender: M            HR:           89 bpm. Exam Location:  Inpatient Procedure: 2D Echo Indications:    stroke 434.91  History:        Patient has no prior history of Echocardiogram examinations.                 Arrythmias:PVC; Risk Factors:Hypertension, Current Smoker and                 Sleep Apnea.  Sonographer:    Johny Chess Referring Phys: [phone] [place] Comments: Image acquisition challenging due to uncooperative patient and Image acquisition challenging due to respiratory motion. IMPRESSIONS  1. Left ventricular ejection fraction, by estimation, is 50 to 55%. The left ventricle has low normal function. The left ventricle has no regional wall motion abnormalities. Left ventricular diastolic parameters are consistent with Grade II diastolic dysfunction (pseudonormalization).  2. Right ventricular systolic function is normal. The right ventricular size is normal. Tricuspid regurgitation signal is inadequate for assessing PA pressure.  3. Left atrial size was moderately dilated.  4. The mitral valve is normal in structure. Mild mitral valve regurgitation. No evidence of mitral stenosis.  5. The aortic valve is grossly normal. Aortic valve regurgitation is not visualized. Mild aortic valve sclerosis is present, with no evidence of aortic valve stenosis.  6. The inferior vena cava is dilated in size with >50% respiratory variability, suggesting right atrial pressure of 8 mmHg. Comparison(s): No prior Echocardiogram. FINDINGS  Left Ventricle: Left ventricular ejection fraction, by estimation, is 50 to 55%. The left ventricle has low normal function. The left ventricle has no regional wall motion abnormalities. The left ventricular internal cavity size was normal in size. There is borderline left ventricular hypertrophy. Left ventricular  diastolic parameters are consistent with Grade II diastolic dysfunction (pseudonormalization). Right Ventricle: The right ventricular size is normal. No increase in right ventricular wall thickness. Right ventricular systolic function is normal. Tricuspid regurgitation signal is inadequate for assessing PA pressure. Left Atrium: Left atrial size was moderately dilated. Right Atrium: Right atrial size was normal in size. Pericardium: Trivial pericardial effusion is present. Mitral Valve: The mitral valve is normal in structure. Mild mitral valve regurgitation. No evidence of mitral valve stenosis. Tricuspid Valve: The tricuspid  valve is normal in structure. Tricuspid valve regurgitation is trivial. Aortic Valve: The aortic valve is grossly normal. Aortic valve regurgitation is not visualized. Mild aortic valve sclerosis is present, with no evidence of aortic valve stenosis. There is mild calcification of the aortic valve. Pulmonic Valve: The pulmonic valve was not well visualized. Pulmonic valve regurgitation is trivial. No evidence of pulmonic stenosis. Aorta: The aortic root, ascending aorta and aortic arch are all structurally normal, with no evidence of dilitation or obstruction. Venous: The inferior vena cava is dilated in size with greater than 50% respiratory variability, suggesting right atrial pressure of 8 mmHg. IAS/Shunts: The atrial septum is grossly normal.  LEFT VENTRICLE PLAX 2D LVIDd:         5.00 cm  Diastology LVIDs:         3.70 cm  LV e' lateral:   9.00 cm/s LV PW:         1.20 cm  LV E/e' lateral: 13.8 LV IVS:        1.10 cm  LV e' medial:    5.00 cm/s LVOT diam:     2.10 cm  LV E/e' medial:  24.8 LV SV:         56 LV SV Index:   26 LVOT Area:     3.46 cm  RIGHT VENTRICLE             IVC RV S prime:     17.00 cm/s  IVC diam: 2.50 cm TAPSE (M-mode): 1.9 cm LEFT ATRIUM             Index       RIGHT ATRIUM           Index LA diam:        4.40 cm 2.05 cm/m  RA Area:     18.60 cm LA Vol (A2C):    81.8 ml 38.09 ml/m RA Volume:   51.80 ml  24.12 ml/m LA Vol (A4C):   87.3 ml 40.65 ml/m LA Biplane Vol: 86.7 ml 40.37 ml/m  AORTIC VALVE LVOT Vmax:   97.40 cm/s LVOT Vmean:  70.400 cm/s LVOT VTI:    0.163 m  AORTA Ao Root diam: 3.30 cm Ao Asc diam:  3.40 cm MITRAL VALVE MV Area (PHT): 3.65 cm     SHUNTS MV Decel Time: 208 msec     Systemic VTI:  0.16 m MV E velocity: 124.00 cm/s  Systemic Diam: 2.10 cm MV A velocity: 94.10 cm/s MV E/A ratio:  1.32 Buford Dresser MD Electronically signed by Buford Dresser MD Signature Date/Time: 04/06/2020/6:38:24 PM    Final      Assessment/Plan: Diagnosis: R MCA infarct s/p tPA 1. Does the need for close, 24 hr/day medical supervision in concert with the patient's rehab needs make it unreasonable for this patient to be served in a less intensive setting? Yes 2. 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 Due to bladder management, bowel management, safety, skin/wound care, disease management, medication administration, pain management and patient education, does the patient require 24 hr/day rehab nursing? Yes 4. Does the patient require coordinated care of a physician, rehab nurse, therapy disciplines of PT, OT, SLP to address physical and functional deficits in the context of the above medical diagnosis(es)? Yes Addressing deficits in the following areas: balance, endurance, locomotion, strength, transferring, bowel/bladder control, bathing, dressing,  feeding, grooming, toileting, cognition, speech, language, swallowing and psychosocial support 5. Can the patient actively participate in an intensive therapy program of at least 3 hrs of therapy per day at least 5 days per week? Yes 6. The potential for patient to make measurable gains  while on inpatient rehab is good 7. Anticipated functional outcomes upon discharge from inpatient rehab are mod assist  with PT, mod assist with OT, mod assist with SLP. 8. Estimated rehab length of stay to reach the above functional goals is: 2-3 weeks 9. Anticipated discharge destination: Home 10. Overall Rehab/Functional Prognosis: good  RECOMMENDATIONS: This patient's condition is appropriate for continued rehabilitative care in the following setting: CIR Patient has agreed to participate in recommended program. N/A Note that insurance prior authorization may be required for reimbursement for recommended care.  Comment: Mr. Gonzaga would be a good CIR candidate if 24/7 can be confirmed. Thank you for this consult. We will continue to follow in Mr. Mr. Copher' care.   Lavon Paganini Angiulli, PA-C 04/08/2020   I have personally performed a face to face diagnostic evaluation, including, but not limited to relevant history and physical exam findings, of this patient and developed relevant assessment and plan.  Additionally, I have reviewed and concur with the physician assistant's documentation above.  Leeroy Cha, MD

## 2020-04-08 NOTE — Progress Notes (Signed)
Inpatient Diabetes Program Recommendations  AACE/ADA: New Consensus Statement on Inpatient Glycemic Control (2015)  Target Ranges:  Prepandial:   less than 140 mg/dL      Peak postprandial:   less than 180 mg/dL (1-2 hours)      Critically ill patients:  140 - 180 mg/dL   Lab Results  Component Value Date   GLUCAP 204 (H) 04/08/2020   HGBA1C 13.5 (H) 04/06/2020    Review of Glycemic Control Results for Rodney Estrada, Rodney Estrada (MRN [id number]) as of 04/08/2020 12:17  Ref. Range 04/07/2020 23:19 04/07/2020 23:22 04/08/2020 03:54 04/08/2020 08:31 04/08/2020 11:47  Glucose-Capillary Latest Ref Range: 70 - 99 mg/dL 183 (H) 181 (H) 273 (H) 235 (H) 204 (H)   Diabetes history: None, New Diagnosis  Not appropriate for education at this time. No family contact on file in chart.  Current orders for Inpatient glycemic control:  Levemir 12 units bid Novolog 0-15 units Q4 hours  A1c 13.5% Jevity 70 ml/hour Inpatient Diabetes Program Recommendations:    -Add Novolog 4 units Q4 hours Tube Feed Coverage  Thanks,  Tama Headings RN, MSN, BC-ADM Inpatient Diabetes Coordinator Team Pager [phone] (8a-5p)

## 2020-04-09 DIAGNOSIS — G936 Cerebral edema: Secondary | ICD-10-CM

## 2020-04-09 LAB — GLUCOSE, CAPILLARY
Glucose-Capillary: 193 mg/dL — ABNORMAL HIGH (ref 70–99)
Glucose-Capillary: 127 mg/dL — ABNORMAL HIGH (ref 70–99)
Glucose-Capillary: 196 mg/dL — ABNORMAL HIGH (ref 70–99)
Glucose-Capillary: 247 mg/dL — ABNORMAL HIGH (ref 70–99)
Glucose-Capillary: 177 mg/dL — ABNORMAL HIGH (ref 70–99)

## 2020-04-09 LAB — CBC
HCT: 41.6 % (ref 39.0–52.0)
Hemoglobin: 13.3 g/dL (ref 13.0–17.0)
MCH: 28.3 pg (ref 26.0–34.0)
MCHC: 32 g/dL (ref 30.0–36.0)
MCV: 88.5 fL (ref 80.0–100.0)
Platelets: 212 10*3/uL (ref 150–400)
RBC: 4.7 MIL/uL (ref 4.22–5.81)
RDW: 14 % (ref 11.5–15.5)
WBC: 9.8 10*3/uL (ref 4.0–10.5)
nRBC: 0 % (ref 0.0–0.2)

## 2020-04-09 LAB — BASIC METABOLIC PANEL
Anion gap: 12 (ref 5–15)
BUN: 17 mg/dL (ref 8–23)
CO2: 24 mmol/L (ref 22–32)
Calcium: 9.2 mg/dL (ref 8.9–10.3)
Chloride: 106 mmol/L (ref 98–111)
Creatinine, Ser: 1.42 mg/dL — ABNORMAL HIGH (ref 0.61–1.24)
GFR calc Af Amer: 59 mL/min — ABNORMAL LOW (ref >60)
GFR calc non Af Amer: 51 mL/min — ABNORMAL LOW (ref >60)
Glucose, Bld: 224 mg/dL — ABNORMAL HIGH (ref 70–99)
Potassium: 3.4 mmol/L — ABNORMAL LOW (ref 3.5–5.1)
Sodium: 142 mmol/L (ref 135–145)

## 2020-04-09 LAB — VITAMIN C: Vitamin C: 0.5 mg/dL (ref 0.4–2.0)

## 2020-04-09 LAB — VITAMIN A: Vitamin A (Retinoic Acid): 52.1 ug/dL (ref 22.0–69.5)

## 2020-04-09 MED ORDER — LORAZEPAM 2 MG/ML IJ SOLN
1.0000 mg | INTRAMUSCULAR | Status: AC | PRN
  Administered 2020-04-10: 2 mg via INTRAVENOUS
  Filled 2020-04-09: qty 1

## 2020-04-09 MED ORDER — LORAZEPAM 1 MG PO TABS: 1.0000 mg | ORAL_TABLET | ORAL | Status: AC | PRN

## 2020-04-09 MED ORDER — POTASSIUM CHLORIDE 20 MEQ/15ML (10%) PO SOLN
40.0000 meq | Freq: Once | ORAL | Status: AC
  Administered 2020-04-09: 40 meq via ORAL
  Filled 2020-04-09: qty 30

## 2020-04-09 NOTE — Progress Notes (Addendum)
STROKE TEAM PROGRESS NOTE   INTERVAL HISTORY RN is at bedside. I met pt sister in the waiting room and updated her pt condition and treatment plan. Sister also talked with our CIR coordinator and case manager about potential placement. Pt today drowsy sleepy after dose of ativan for agitation concerning for alcohol withdraw. But still spontaneous moving right UE and LE, orientated to place and people. Following simple commands. Will repeat CTA head tomorrow.    Vitals:   04/09/20 0411 04/09/20 0600 04/09/20 0700 04/09/20 0900  BP: 105/65   129/90  Pulse: 80 84 74 83  Resp: 18 17 14 18  Temp: 98.4 F (36.9 C)   97.7 F (36.5 C)  TempSrc: Oral   Axillary  SpO2: 98% 98% 95% 93%  Weight:      Height:       CBC:  Recent Labs  Lab 04/05/20 1049 04/05/20 1056 04/08/20 0744 04/09/20 0351  WBC 9.0   < > 8.8 9.8  NEUTROABS 5.2  --   --   --   HGB 16.0   < > 13.6 13.3  HCT 46.7   < > 41.8 41.6  MCV 84.8   < > 87.6 88.5  PLT 205   < > 190 212   < > = values in this interval not displayed.   Basic Metabolic Panel:  Recent Labs  Lab 04/08/20 0744 04/08/20 1659 04/09/20 0351  NA 141  --  142  K 3.8  --  3.4*  CL 107  --  106  CO2 23  --  24  GLUCOSE 255*  --  224*  BUN 9  --  17  CREATININE 1.14  --  1.42*  CALCIUM 8.8*  --  9.2  MG 1.8 1.9  --   PHOS 2.7 2.8  --    Lipid Panel:  Recent Labs  Lab 04/06/20 0613 04/06/20 0613 04/07/20 0421  CHOL 206*  --   --   TRIG 550*   < > 261*  HDL 31*  --   --   CHOLHDL 6.6  --   --   VLDL UNABLE TO CALCULATE IF TRIGLYCERIDE OVER 400 mg/dL  --   --   LDLCALC UNABLE TO CALCULATE IF TRIGLYCERIDE OVER 400 mg/dL  --   --    < > = values in this interval not displayed.   HgbA1c:  Recent Labs  Lab 04/06/20 0613  HGBA1C 13.5*   Urine Drug Screen:  Recent Labs  Lab 04/05/20 1956  LABOPIA NONE DETECTED  COCAINSCRNUR POSITIVE*  LABBENZ NONE DETECTED  AMPHETMU NONE DETECTED  THCU NONE DETECTED  LABBARB NONE DETECTED     Alcohol Level  Recent Labs  Lab 04/05/20 1049  ETH <10    IMAGING past 24 hours No results found.  PHYSICAL EXAM   Temp:  [97.7 F (36.5 C)-99.9 F (37.7 C)] 97.7 F (36.5 C) (07/16 0900) Pulse Rate:  [74-98] 83 (07/16 0900) Resp:  [14-18] 18 (07/16 0900) BP: (105-176)/(65-117) 129/90 (07/16 0900) SpO2:  [93 %-100 %] 93 % (07/16 0900)  General - Well nourished, well developed, drowsy sleepy after ativan.  Ophthalmologic - fundi not visualized due to noncooperation.  Cardiovascular - Regular rhythm and rate.  Neuro - drowsy sleepy after ativan, eyes closed but able to open with voice but needed repetitive stimulation to keep eyes open, severe dysarthria, orientated to place and people, not to time, able to follow simple commands. Right gaze preference but able to barely   cross midline, blinking to visual threat on the right, but not to the left. Left facial droop. Tongue midline. Left UE flaccid. Left LE 2-/5 proximal and 0/5 distal. RUE 4/5 and RLE 3/5 at least. Left babinski positive. Sensation, coordination not cooperative and gait not tested.    ASSESSMENT/PLAN [name] is a 69 y.o. male with history of tobacco use presenting with L sided flaccidity, R gaze, L facial droop, periods of apnea in setting of BP 260/140. IV tPA administered 04/05/2020 at 1116. Taken to IR for R M1 occlusion.   Stroke: R MCA infarct s/p tPA and evt w/ complete revascularization of R M1 following angioplasty w/ stent placement, infarct secondary to large vessel disease  Code Stroke CT head No acute abnormality. Numerous chronic lacunes B basal ganglia, R thalamic, white matter. ASPECTS 10.     CTA head R M1 occlusion. Intracranial B ICA moderate stenosis. B P2 w/ moderate stenoses.  CTA neck proximal L ICA 40-50% stenosis. R ICA bifurcation & proximal w/ mixed plaque. Mild to moderate distal cervical R ICA. L VA origin moderate stenosis.  Cerebral angio R M1 occlusion s/p EVT and R M1  stent placed w/ complete revascularization     CT head 7/12 1715 developing R MCA infarct. Mild hyperdensity R putamen, ? Contrast vs hemorrhage.   MRI  Evolving R large MCA infarct (basal ganglia, insula, temporal, frontal lobes). Minimal petechial hemorrhage.   MRA  interval R M1 stent patent  CT repeat 7/15 stable extensive R MCA infarct w/ regional mass effect and partial effacement R temporal horn, mild petechial hemorrhage. R M1 MCA stent, asymmetric hyperdense R M2.  CT and CTA head repeat pending in am  2D Echo EF 50-55%  TG 550->261, direct LDL 108.2   HgbA1c 13.5  VTE prophylaxis - lovenox  No antithrombotic prior to admission, treated w/ cangrelor post IR, loaded w/ Brilinta 180 and aspirin post op, now on aspirin 81 mg daily and Brilinta (ticagrelor) 90 mg bid.   Therapy recommendations:  CIR   Disposition:  pending   Cerebral edema  CT 7/15 showed large right MCA infarct with regional mass-effect  No significant midline shift yet  We will need close monitoring  We will repeat a CTA head in am  Hypertensive Emergency  BP as high as 260/140 on admission   Home meds:  None  Treated w max Cleviprex post IR for Post IR tight BP control -> now off   now SBP goal < 180/105  on lisinopril 20  On hydralazine PRN . Long-term BP goal normotensive  Hyperlipidemia  Home meds:  None  Now on lipitor 80  TG 550->261, direct LDL 108.2, goal < 70  Continue statin at discharge  Diabetes type II Uncontrolled w/ Hyperglycemia  Home meds:  none  HgbA1c 13.5, goal < 7.0  CBGs  SSI 0-9 q4h  DB RN recommends add levemir 12u bid, Novolog 4u q4h TF coverage (added)  Dysphagia . Secondary to stroke . NPO . Speech on board . On cortrak and TF @ 52 and IVF   AKI   Cre 1.69->1.60->1.12-<1.14->1.42  increase IVF to 50cc  Continue TF @ 70  BMP monitoring   Tobacco abuse  Current smoker  Smoking cessation counseling will be provided  Nicotine  patch 14 mg daily added  Pt is willing to quit    Alcohol abuse  Heavy user as per sister  alcohol level <10  CIWA protocol   advised to drink no  more than 2 drink(s) a day  Close monitor for alcohol withdraw  Cocaine abuse  Pt admitted using cocaine  Cessation education provided  Pt is willing to quit  Avoid beta blocker at this time  Other Stroke Risk Factors  Advanced age  Obesity, Body mass index is 32.34 kg/m., recommend weight loss, diet and exercise as appropriate   Other Active Problems  Hypokalemia, K 3.4->3.3->3.0->3.8->3.4 - supplement  Hypomagnesemia, Mg 1.4 - supplemented - 1.8 - resolved  Hospital day # 4  I had long discussion with sister in waiting room, updated pt current condition, treatment plan and potential prognosis, and answered all the questions. She expressed understanding and appreciation.    Rosalin Hawking, MD PhD Stroke Neurology 04/09/2020 11:20 AM   To contact Stroke Continuity provider, please refer to [email or web address] After hours, contact General Neurology

## 2020-04-09 NOTE — TOC CAGE-AID Note (Signed)
Transition of Care [facility]) - CAGE-AID Screening   Patient Details  Name: Rodney Estrada MRN: [id number] Date of Birth: July 29, 1951  Transition of [facility]) CM/SW Contact:    Emeterio Reeve, [place] Phone Number: 04/09/2020, 4:52 PM   Clinical Narrative:  CSW met with pt at bedside. CSW introduced self and explained her role at the hospital.  CSW attempted to complete assessment. Pt looked at csw turned head and closed eyes. CSW may reattempt.    CAGE-AID Screening: Substance Abuse Screening unable to be completed due to: : Patient Refused            Emeterio Reeve, [place], [place] Social Worker [phone]

## 2020-04-09 NOTE — Progress Notes (Signed)
  Speech Language Pathology Treatment: Dysphagia  [name] MRN: [id number] DOB: November 01, 1950 Today's Date: 04/09/2020 Time: 8309-4076 SLP Time Calculation (min) (ACUTE ONLY): 13 min  Assessment / Plan / Recommendation Clinical Impression  Pt is alert this afternoon compared to previous SLP visits. He still has significant signs of oral dysphagia, with anterior spillage and moderate-large amounts of L buccal pocketing. SLP provided consistent cues to try to increase labial seal, oral clearance. Coughing is noted mostly with thin liquids, but he also shows no awareness as the L side of his mouth becomes filled with applesauce. When asked if he swallowed everything he replied "yes." SLP used yankauer to remove oral residue. He is not yet ready for PO diet. Will continue to follow, with MBS at least to be completed prior to decisions regarding longer-term alternative nutrition given that d/c plan is now SNF.    HPI HPI: Patient is a 69 y/o male who presents with acute CVA s/p cerebral arteriogram with emergent mechanical thrombectomy of right MCA M1 occlusion along with stent assisted angioplasty of right MCA M1 04/05/20. s/p tpA. + cocaine. 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 No PMH.      SLP Plan  Continue with current plan of care       Recommendations  Diet recommendations: NPO                Oral Care Recommendations: Oral care QID Follow up Recommendations: Skilled Nursing facility SLP Visit Diagnosis: Dysphagia, oropharyngeal phase (R13.12) Plan: Continue with current plan of care       GO                Osie Bond., M.A. [place] [facility] Pager [phone] Office [phone]  04/09/2020, 2:54 PM

## 2020-04-09 NOTE — TOC Initial Note (Signed)
Transition of Care [facility]) - Initial/Assessment Note    Patient Details  Name: Rodney Estrada MRN: [id number] Date of Birth: 1951-01-12  Transition of Care [facility]) CM/SW Contact:    Pollie Friar, RN Phone Number: 04/09/2020, 11:50 AM  Clinical Narrative:                 Pts sister at the hospital to visit the patient. CM met with her and has updated the contacts for the patient. Sister feels he has insurance and states she provided the card to one of the nurses. CM requested she please send to CM so we can get the information entered. She voiced agreement.  She states the patient will not have 24/7 assistance after a CIR stay and is interested in him going to SNF rehab in the / [place] area.  Pt will need the cortrak d/ced prior to admission to a SNF with pt taking a diet or PEG tube. Sister updated and voiced understanding.  Once we have his insurance information we can fax him out for SNF rehab.  TOC following.  Expected Discharge Plan: Skilled Nursing Facility Barriers to Discharge: Continued Medical Work up   Patient Goals and CMS Choice        Expected Discharge Plan and Services Expected Discharge Plan: [place] In-house Referral: Clinical Social Work Discharge Planning Services: CM Consult Post Acute Care Choice: [place] Living arrangements for the past 2 months: Apartment                                      Prior Living Arrangements/Services Living arrangements for the past 2 months: Apartment Lives with:: Self Patient language and need for interpreter reviewed:: Yes        Need for Family Participation in Patient Care: Yes (Comment) Care giver support system in place?: No (comment)   Criminal Activity/Legal Involvement Pertinent to Current Situation/Hospitalization: No - Comment as needed  Activities of Daily Living      Permission Sought/Granted                  Emotional Assessment           Psych  Involvement: No (comment)  Admission diagnosis:  Fall [W19.XXXA] Stroke [facility]) [I63.9] Stroke (cerebrum) [facility]) [I63.9] Patient Active Problem List   Diagnosis Date Noted  . Stroke (cerebrum) ([place]) 04/05/2020   PCP:  No primary care provider on file. Pharmacy:   [facility] [place], [place] AT [facility] [address] [place] [address] Phone: [phone] Fax: [phone]     Social Determinants of Health (SDOH) Interventions    Readmission Risk Interventions No flowsheet data found.

## 2020-04-09 NOTE — Progress Notes (Signed)
Physical Therapy Treatment [name] MRN: [id number] DOB: [birth year] Today's Date: 04/09/2020    History of Present Illness Patient is a 69 y/o male who presents with acute CVA s/p cerebral arteriogram with emergent mechanical thrombectomy of right MCA M1 occlusion along with stent assisted angioplasty of right MCA M1 04/05/20. s/p tpA. + cocaine. 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 No PMH.    PT Comments    Pt very lethargic today. He received Ativan overnight due to agitation, suspected to be related to withdrawal. He required +2 max/total assist bed mobility and max assist to maintain sitting balance EOB. Pt following one-step commands consistently. Performed reaching exercises, outside of BOS, in sitting. Unable to safely transition to recliner due to lethargy. Returned to supine in bed at end of session. Pt's sister present at beginning of session. She confirmed pt will not have 24/7 assist at discharge. D/C recommendations, therefore, updated to SNF.    Follow Up Recommendations  SNF     Equipment Recommendations  Wheelchair (measurements PT);Wheelchair cushion (measurements PT);Hospital bed;Other (comment)    Recommendations for Other Services       Precautions / Restrictions Precautions Precautions: Fall;Other (comment) Precaution Comments: NG tube; SBP <180    Mobility  Bed Mobility Overal bed mobility: Needs Assistance Bed Mobility: Supine to Sit;Sit to Supine     Supine to sit: +2 for physical assistance;Total assist Sit to supine: Max assist;+2 for physical assistance   General bed mobility comments: helicopter method using bed pad for transition to/from EOB  Transfers                    Ambulation/Gait                 Stairs             Wheelchair Mobility    Modified Rankin (Stroke Patients Only) Modified Rankin  (Stroke Patients Only) Pre-Morbid Rankin Score: No symptoms Modified Rankin: Severe disability     Balance Overall balance assessment: Needs assistance Sitting-balance support: Single extremity supported;Feet supported Sitting balance-Leahy Scale: Zero Sitting balance - Comments: max assist to maintain sitting balance EOB. Heavy posterior lean. Postural control: Posterior lean                                  Cognition Arousal/Alertness: Lethargic;Suspect due to medications Behavior During Therapy: Flat affect Overall Cognitive Status: Impaired/Different from baseline Area of Impairment: Attention;Memory;Following commands;Safety/judgement;Awareness;Problem solving                   Current Attention Level: Focused Memory: Decreased recall of precautions;Decreased short-term memory Following Commands: Follows one step commands consistently Safety/Judgement: Decreased awareness of safety;Decreased awareness of deficits Awareness: Intellectual Problem Solving: Difficulty sequencing;Requires verbal cues General Comments: very lethargic today. Pt given Ativan overnight.      Exercises      General Comments General comments (skin integrity, edema, etc.): BP 142/57 prior to session. BP 153/93 after session.      Pertinent Vitals/Pain Pain Assessment: Faces Faces Pain Scale: No hurt    Home Living                      Prior Function            PT Goals (current goals can now be found in the care plan  section) Acute Rehab PT Goals Patient Stated Goal: not stated Progress towards PT goals: Not progressing toward goals - comment (lethargy)    Frequency    Min 3X/week      PT Plan Discharge plan needs to be updated;Frequency needs to be updated    Co-evaluation              AM-PAC PT "6 Clicks" Mobility   Outcome Measure  Help needed turning from your back to your side while in a flat bed without using bedrails?: Total Help  needed moving from lying on your back to sitting on the side of a flat bed without using bedrails?: Total Help needed moving to and from a bed to a chair (including a wheelchair)?: Total Help needed standing up from a chair using your arms (e.g., wheelchair or bedside chair)?: Total Help needed to walk in hospital room?: Total Help needed climbing 3-5 steps with a railing? : Total 6 Click Score: 6    End of Session   Activity Tolerance: Patient limited by lethargy Patient left: in bed;with call bell/phone within reach;with bed alarm set;with nursing/sitter in room;with restraints reapplied Nurse Communication: Mobility status PT Visit Diagnosis: Hemiplegia and hemiparesis;Difficulty in walking, not elsewhere classified (R26.2);Unsteadiness on feet (R26.81) Hemiplegia - Right/Left: Left Hemiplegia - dominant/non-dominant: Non-dominant Hemiplegia - caused by: Cerebral infarction     Time: 1045-1110 PT Time Calculation (min) (ACUTE ONLY): 25 min  Charges:  $Therapeutic Activity: 23-37 mins                     Lorrin Goodell, PT  Office # [phone] Pager [phone]    Rodney Estrada 04/09/2020, 12:09 PM

## 2020-04-09 NOTE — Progress Notes (Signed)
Inpatient Rehabilitation-Admissions Coordinator   Met with pt's sister as follow up after CIR consult. Please see formal consult completed by Dr. Ranell Patrick on 7/15 for details. Discussed recommended rehab program and the recommended support required after a CIR stay. At this time, the patient's sister reports he will not have 24/7 A as he lived alone PTA and no one is able to assist at DC. Due to lack of caregiver assistance, this patient will need SNF placement.  TOC team aware of need for SNF; AC will sign off.   Raechel Ache, OTR/L  Rehab Admissions Coordinator  [phone] 04/09/2020 12:11 PM

## 2020-04-10 ENCOUNTER — Inpatient Hospital Stay (HOSPITAL_COMMUNITY): Payer: 59

## 2020-04-10 ENCOUNTER — Encounter (HOSPITAL_COMMUNITY): Payer: Self-pay | Admitting: Student in an Organized Health Care Education/Training Program

## 2020-04-10 LAB — GLUCOSE, CAPILLARY
Glucose-Capillary: 165 mg/dL — ABNORMAL HIGH (ref 70–99)
Glucose-Capillary: 209 mg/dL — ABNORMAL HIGH (ref 70–99)
Glucose-Capillary: 213 mg/dL — ABNORMAL HIGH (ref 70–99)
Glucose-Capillary: 234 mg/dL — ABNORMAL HIGH (ref 70–99)
Glucose-Capillary: 238 mg/dL — ABNORMAL HIGH (ref 70–99)
Glucose-Capillary: 246 mg/dL — ABNORMAL HIGH (ref 70–99)
Glucose-Capillary: 249 mg/dL — ABNORMAL HIGH (ref 70–99)

## 2020-04-10 LAB — BASIC METABOLIC PANEL
Anion gap: 13 (ref 5–15)
BUN: 30 mg/dL — ABNORMAL HIGH (ref 8–23)
CO2: 19 mmol/L — ABNORMAL LOW (ref 22–32)
Calcium: 9.3 mg/dL (ref 8.9–10.3)
Chloride: 111 mmol/L (ref 98–111)
Creatinine, Ser: 1.28 mg/dL — ABNORMAL HIGH (ref 0.61–1.24)
GFR calc Af Amer: >60 mL/min (ref >60)
GFR calc non Af Amer: 58 mL/min — ABNORMAL LOW (ref >60)
Glucose, Bld: 194 mg/dL — ABNORMAL HIGH (ref 70–99)
Potassium: 3.6 mmol/L (ref 3.5–5.1)
Sodium: 143 mmol/L (ref 135–145)

## 2020-04-10 LAB — CBC
HCT: 46.6 % (ref 39.0–52.0)
Hemoglobin: 14.8 g/dL (ref 13.0–17.0)
MCH: 29 pg (ref 26.0–34.0)
MCHC: 31.8 g/dL (ref 30.0–36.0)
MCV: 91.2 fL (ref 80.0–100.0)
Platelets: 243 10*3/uL (ref 150–400)
RBC: 5.11 MIL/uL (ref 4.22–5.81)
RDW: 14.2 % (ref 11.5–15.5)
WBC: 10.5 10*3/uL (ref 4.0–10.5)
nRBC: 0 % (ref 0.0–0.2)

## 2020-04-10 MED ORDER — IOHEXOL 350 MG/ML SOLN
50.0000 mL | Freq: Once | INTRAVENOUS | Status: AC | PRN
  Administered 2020-04-10: 50 mL via INTRAVENOUS

## 2020-04-10 MED ORDER — SODIUM CHLORIDE 0.9% FLUSH: 10.0000 mL | INTRAVENOUS | Status: DC | PRN

## 2020-04-10 MED ORDER — SODIUM CHLORIDE 0.9% FLUSH
10.0000 mL | Freq: Two times a day (BID) | INTRAVENOUS | Status: DC
  Administered 2020-04-10 – 2020-04-17 (×15): 10 mL
  Administered 2020-04-18: 40 mL
  Administered 2020-04-18 – 2020-04-19 (×2): 10 mL

## 2020-04-10 NOTE — Progress Notes (Signed)
STROKE TEAM PROGRESS NOTE   INTERVAL HISTORY Patient is lying in bed.  He continues to have dense left hemiplegia and right gaze preference with left-sided neglect has improved.  Vital signs are stable.  Speech therapy still recommends nil by mouth.  Is getting tube feeds.  CT angiogram of the head this morning shows patent right MCA stent with good flow to the right MCA branches.  There is moderate stenosis of the right supraclinoid ICA due to calcific stenosis and moderate left supraclinoid ICA stenosis as well.  Severe stenosis of left P2 segment.  Subacute right MCA infarct with decreased cytotoxic edema.  Vitals:   04/09/20 1600 04/09/20 1900 04/09/20 2300 04/10/20 0348  BP: (!) 145/96 (!) 152/96 (!) 168/96 (!) 164/117  Pulse: 89 91 93 87  Resp: 18 19 19 17   Temp: 99.3 F (37.4 C) 99.7 F (37.6 C) 98.9 F (37.2 C) 98.1 F (36.7 C)  TempSrc: Oral Oral Oral Other (Comment)  SpO2: 96% 96% 94% 95%  Weight:      Height:       CBC:  Recent Labs  Lab 04/05/20 1049 04/05/20 1056 04/08/20 0744 04/09/20 0351  WBC 9.0   < > 8.8 9.8  NEUTROABS 5.2  --   --   --   HGB 16.0   < > 13.6 13.3  HCT 46.7   < > 41.8 41.6  MCV 84.8   < > 87.6 88.5  PLT 205   < > 190 212   < > = values in this interval not displayed.   Basic Metabolic Panel:  Recent Labs  Lab 04/08/20 0744 04/08/20 1659 04/09/20 0351  NA 141  --  142  K 3.8  --  3.4*  CL 107  --  106  CO2 23  --  24  GLUCOSE 255*  --  224*  BUN 9  --  17  CREATININE 1.14  --  1.42*  CALCIUM 8.8*  --  9.2  MG 1.8 1.9  --   PHOS 2.7 2.8  --    Lipid Panel:  Recent Labs  Lab 04/06/20 0613 04/06/20 0613 04/07/20 0421  CHOL 206*  --   --   TRIG 550*   < > 261*  HDL 31*  --   --   CHOLHDL 6.6  --   --   VLDL UNABLE TO CALCULATE IF TRIGLYCERIDE OVER 400 mg/dL  --   --   LDLCALC UNABLE TO CALCULATE IF TRIGLYCERIDE OVER 400 mg/dL  --   --    < > = values in this interval not displayed.   HgbA1c:  Recent Labs  Lab  04/06/20 0613  HGBA1C 13.5*   Urine Drug Screen:  Recent Labs  Lab 04/05/20 1956  LABOPIA NONE DETECTED  COCAINSCRNUR POSITIVE*  LABBENZ NONE DETECTED  AMPHETMU NONE DETECTED  THCU NONE DETECTED  LABBARB NONE DETECTED    Alcohol Level  Recent Labs  Lab 04/05/20 1049  ETH <10    IMAGING past 24 hours No results found.  PHYSICAL EXAM   Temp:  [97.7 F (36.5 C)-99.7 F (37.6 C)] 98.1 F (36.7 C) (07/17 0348) Pulse Rate:  [74-93] 87 (07/17 0348) Resp:  [14-19] 17 (07/17 0348) BP: (129-168)/(89-117) 164/117 (07/17 0348) SpO2:  [93 %-98 %] 95 % (07/17 0348)  General - Well nourished, well developed, drowsy but can be easily aroused  Ophthalmologic - fundi not visualized due to noncooperation.  Cardiovascular - Regular rhythm and rate.  Neuro -  drowsy   eyes closed but able to open with voice but needed repetitive stimulation to keep eyes open, severe dysarthria, oriented to place and people, not to time, able to follow simple commands. Right gaze preference but able to barely cross midline, blinking to visual threat on the right, but not to the left. Left facial droop. Tongue midline. Left UE flaccid. Left LE 2-/5 proximal and 0/5 distal. RUE 4/5 and RLE 4/5  Left babinski positive. Sensation, coordination not cooperative and gait not tested.    ASSESSMENT/PLAN Rodney Estrada is a 69 y.o. male with history of tobacco use presenting with L sided flaccidity, R gaze, L facial droop, periods of apnea in setting of BP 260/140. IV tPA administered 04/05/2020 at 1116. Taken to IR for R M1 occlusion.   Stroke: R MCA infarct s/p tPA and evt w/ complete revascularization of R M1 following angioplasty w/ stent placement, infarct secondary to large vessel disease  Code Stroke CT head No acute abnormality. Numerous chronic lacunes B basal ganglia, R thalamic, white matter. ASPECTS 10.     CTA head R M1 occlusion. Intracranial B ICA moderate stenosis. B P2 w/ moderate  stenoses.  CTA neck proximal L ICA 40-50% stenosis. R ICA bifurcation & proximal w/ mixed plaque. Mild to moderate distal cervical R ICA. L VA origin moderate stenosis.  Cerebral angio R M1 occlusion s/p EVT and R M1 stent placed w/ complete revascularization     CT head 7/12 1715 developing R MCA infarct. Mild hyperdensity R putamen, ? Contrast vs hemorrhage.   MRI  Evolving R large MCA infarct (basal ganglia, insula, temporal, frontal lobes). Minimal petechial hemorrhage.   MRA  interval R M1 stent patent  CT repeat 7/15 stable extensive R MCA infarct w/ regional mass effect and partial effacement R temporal horn, mild petechial hemorrhage. R M1 MCA stent, asymmetric hyperdense R M2.  CT and CTA head repeat pending in am  2D Echo EF 50-55%  TG 550->261, direct LDL 108.2   HgbA1c 13.5  VTE prophylaxis - lovenox  No antithrombotic prior to admission, treated w/ cangrelor post IR, loaded w/ Brilinta 180 and aspirin post op, now on aspirin 81 mg daily and Brilinta (ticagrelor) 90 mg bid.   Therapy recommendations:  CIR   Disposition:  pending   Cerebral edema  CT 7/15 showed large right MCA infarct with regional mass-effect  No significant midline shift yet  We will need close monitoring  We will repeat a CTA head in am  Hypertensive Emergency  BP as high as 260/140 on admission   Home meds:  None  Treated w max Cleviprex post IR for Post IR tight BP control -> now off   now SBP goal < 180/105  on lisinopril 20  On hydralazine PRN . Long-term BP goal normotensive  Hyperlipidemia  Home meds:  None  Now on lipitor 80  TG 550->261, direct LDL 108.2, goal < 70  Continue statin at discharge  Diabetes type II Uncontrolled w/ Hyperglycemia  Home meds:  none  HgbA1c 13.5, goal < 7.0  CBGs  SSI 0-9 q4h  DB RN recommends add levemir 12u bid, Novolog 4u q4h TF coverage (added)  Dysphagia . Secondary to stroke . NPO . Speech on board . On cortrak and  TF @ 39 and IVF   AKI   Cre 1.69->1.60->1.12-<1.14->1.42  increase IVF to 50cc  Continue TF @ 70  BMP monitoring   Tobacco abuse  Current smoker  Smoking cessation counseling  will be provided  Nicotine patch 14 mg daily added  Pt is willing to quit    Alcohol abuse  Heavy user as per sister  alcohol level <10  CIWA protocol   advised to drink no more than 2 drink(s) a day  Close monitor for alcohol withdraw  Cocaine abuse  Pt admitted using cocaine  Cessation education provided  Pt is willing to quit  Avoid beta blocker at this time  Other Stroke Risk Factors  Advanced age  Obesity, Body mass index is 32.34 kg/m., recommend weight loss, diet and exercise as appropriate   Other Active Problems  Hypokalemia, K 3.4->3.3->3.0->3.8->3.4 - supplement  Hypomagnesemia, Mg 1.4 - supplemented - 1.8 - resolved  Hospital day # 5  I had long discussion with patient about his treatment plan and potential prognosis, and answered   questions.  Continue tube feeding still he is able to swallow.  Likely transfer to skilled nursing facility next weeks after he is able to swallow or PEG tube.  No family available for discussion today.  Plan check CBC and BMP tomorrow greater than 50% time during this 25-minute visit was spent on counseling and coordination of care and discussion with care team about his stroke and dysphagia and answering questions  Antony Contras, MD   To contact Stroke Continuity provider, please refer to [email or web address] After hours, contact General Neurology

## 2020-04-11 LAB — BASIC METABOLIC PANEL
Anion gap: 10 (ref 5–15)
BUN: 29 mg/dL — ABNORMAL HIGH (ref 8–23)
CO2: 23 mmol/L (ref 22–32)
Calcium: 9.4 mg/dL (ref 8.9–10.3)
Chloride: 109 mmol/L (ref 98–111)
Creatinine, Ser: 1.26 mg/dL — ABNORMAL HIGH (ref 0.61–1.24)
GFR calc Af Amer: >60 mL/min (ref >60)
GFR calc non Af Amer: 59 mL/min — ABNORMAL LOW (ref >60)
Glucose, Bld: 233 mg/dL — ABNORMAL HIGH (ref 70–99)
Potassium: 3.9 mmol/L (ref 3.5–5.1)
Sodium: 142 mmol/L (ref 135–145)

## 2020-04-11 LAB — GLUCOSE, CAPILLARY
Glucose-Capillary: 234 mg/dL — ABNORMAL HIGH (ref 70–99)
Glucose-Capillary: 216 mg/dL — ABNORMAL HIGH (ref 70–99)
Glucose-Capillary: 197 mg/dL — ABNORMAL HIGH (ref 70–99)
Glucose-Capillary: 218 mg/dL — ABNORMAL HIGH (ref 70–99)
Glucose-Capillary: 185 mg/dL — ABNORMAL HIGH (ref 70–99)
Glucose-Capillary: 236 mg/dL — ABNORMAL HIGH (ref 70–99)

## 2020-04-11 LAB — CBC WITH DIFFERENTIAL/PLATELET
Abs Immature Granulocytes: 0.05 10*3/uL (ref 0.00–0.07)
Basophils Absolute: 0 10*3/uL (ref 0.0–0.1)
Basophils Relative: 0 %
Eosinophils Absolute: 0.4 10*3/uL (ref 0.0–0.5)
Eosinophils Relative: 4 %
HCT: 42.7 % (ref 39.0–52.0)
Hemoglobin: 13.6 g/dL (ref 13.0–17.0)
Immature Granulocytes: 1 %
Lymphocytes Relative: 19 %
Lymphs Abs: 1.8 10*3/uL (ref 0.7–4.0)
MCH: 29.1 pg (ref 26.0–34.0)
MCHC: 31.9 g/dL (ref 30.0–36.0)
MCV: 91.4 fL (ref 80.0–100.0)
Monocytes Absolute: 1 10*3/uL (ref 0.1–1.0)
Monocytes Relative: 10 %
Neutro Abs: 6.4 10*3/uL (ref 1.7–7.7)
Neutrophils Relative %: 66 %
Platelets: 239 10*3/uL (ref 150–400)
RBC: 4.67 MIL/uL (ref 4.22–5.81)
RDW: 14.2 % (ref 11.5–15.5)
WBC: 9.7 10*3/uL (ref 4.0–10.5)
nRBC: 0 % (ref 0.0–0.2)

## 2020-04-11 NOTE — Progress Notes (Signed)
STROKE TEAM PROGRESS NOTE   INTERVAL HISTORY Patient is lying in bed.  His RN is at bedside.He continues to have dense left hemiplegia and right gaze preference with left-sided neglect  Vital signs are stable.  CBC and BMP from this morning are unremarkable.  Vitals:   04/10/20 1932 04/10/20 2305 04/11/20 0313 04/11/20 0838  BP: (!) 159/88 (!) 162/91 (!) 157/85   Pulse: 83 81 85 80  Resp: 19 18 18    Temp: 98.5 F (36.9 C) 98.6 F (37 C) 98.3 F (36.8 C) 99.2 F (37.3 C)  TempSrc: Oral Oral Oral Axillary  SpO2: 98% 98% 99% 97%  Weight:   99.1 kg   Height:       CBC:  Recent Labs  Lab 04/05/20 1049 04/05/20 1056 04/10/20 0622 04/11/20 0435  WBC 9.0   < > 10.5 9.7  NEUTROABS 5.2  --   --  6.4  HGB 16.0   < > 14.8 13.6  HCT 46.7   < > 46.6 42.7  MCV 84.8   < > 91.2 91.4  PLT 205   < > 243 239   < > = values in this interval not displayed.   Basic Metabolic Panel:  Recent Labs  Lab 04/08/20 0744 04/08/20 1659 04/09/20 0351 04/10/20 0622 04/11/20 0435  NA 141  --    < > 143 142  K 3.8  --    < > 3.6 3.9  CL 107  --    < > 111 109  CO2 23  --    < > 19* 23  GLUCOSE 255*  --    < > 194* 233*  BUN 9  --    < > 30* 29*  CREATININE 1.14  --    < > 1.28* 1.26*  CALCIUM 8.8*  --    < > 9.3 9.4  MG 1.8 1.9  --   --   --   PHOS 2.7 2.8  --   --   --    < > = values in this interval not displayed.   Lipid Panel:  Recent Labs  Lab 04/06/20 0613 04/06/20 0613 04/07/20 0421  CHOL 206*  --   --   TRIG 550*   < > 261*  HDL 31*  --   --   CHOLHDL 6.6  --   --   VLDL UNABLE TO CALCULATE IF TRIGLYCERIDE OVER 400 mg/dL  --   --   LDLCALC UNABLE TO CALCULATE IF TRIGLYCERIDE OVER 400 mg/dL  --   --    < > = values in this interval not displayed.   HgbA1c:  Recent Labs  Lab 04/06/20 0613  HGBA1C 13.5*   Urine Drug Screen:  Recent Labs  Lab 04/05/20 1956  LABOPIA NONE DETECTED  COCAINSCRNUR POSITIVE*  LABBENZ NONE DETECTED  AMPHETMU NONE DETECTED  THCU NONE  DETECTED  LABBARB NONE DETECTED    Alcohol Level  Recent Labs  Lab 04/05/20 1049  ETH <10    IMAGING past 24 hours No results found.   CT / CTA Head 04/10/2020 IMPRESSION: 1. Subacute infarct right MCA territory with decreased cytotoxic edema. No hemorrhage or new infarct. 2. 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 Right MCA stent is patent with good flow to right MCA branches. 3. Moderate stenosis left supraclinoid internal carotid artery due to calcific stenosis. Left middle cerebral artery widely patent. Anterior cerebral arteries patent bilaterally. 4. Severe stenosis left P2  segment. Mild stenosis right posterior cerebral artery.  PHYSICAL EXAM   Temp:  [98.3 F (36.8 C)-99.2 F (37.3 C)] 99.2 F (37.3 C) (07/18 0838) Pulse Rate:  [80-85] 80 (07/18 0838) Resp:  [18-19] 18 (07/18 0313) BP: (157-171)/(85-135) 157/85 (07/18 0313) SpO2:  [97 %-99 %] 97 % (07/18 0838) Weight:  [99.1 kg] 99.1 kg (07/18 0313)  General - Well nourished, well developed, awake and interactive Ophthalmologic - fundi not visualized due to noncooperation.  Cardiovascular - Regular rhythm and rate.  Neuro -awake and alert, severe dysarthria, oriented to place and people, not to time, able to follow simple commands. Right gaze preference but able to barely cross midline, blinking to visual threat on the right, but not to the left. Left facial droop. Tongue midline. Left UE flaccid. Left LE 2-/5 proximal and 0/5 distal. RUE 4/5 and RLE 4/5  Left babinski positive. Sensation, coordination not cooperative and gait not tested.    ASSESSMENT/PLAN Rodney Estrada is a 69 y.o. male with history of tobacco use presenting with L sided flaccidity, R gaze, L facial droop, periods of apnea in setting of BP 260/140. IV tPA administered 04/05/2020 at 1116. Taken to IR for R M1 occlusion.   Stroke: R MCA infarct s/p tPA and evt w/ complete  revascularization of R M1 following angioplasty w/ stent placement, infarct secondary to large vessel disease  Code Stroke CT head No acute abnormality. Numerous chronic lacunes B basal ganglia, R thalamic, white matter. ASPECTS 10.     CTA head R M1 occlusion. Intracranial B ICA moderate stenosis. B P2 w/ moderate stenoses.  CTA neck proximal L ICA 40-50% stenosis. R ICA bifurcation & proximal w/ mixed plaque. Mild to moderate distal cervical R ICA. L VA origin moderate stenosis.  Cerebral angio R M1 occlusion s/p EVT and R M1 stent placed w/ complete revascularization     CT head 7/12 1715 developing R MCA infarct. Mild hyperdensity R putamen, ? Contrast vs hemorrhage.   MRI  Evolving R large MCA infarct (basal ganglia, insula, temporal, frontal lobes). Minimal petechial hemorrhage.   MRA  interval R M1 stent patent  CT repeat 7/15 stable extensive R MCA infarct w/ regional mass effect and partial effacement R temporal horn, mild petechial hemorrhage. R M1 MCA stent, asymmetric hyperdense R M2.  CT and CTA 04/10/20 - Subacute infarct right MCA territory with decreased cytotoxic edema. No hemorrhage or new infarct. 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 Right MCA stent is patent with good flow to right MCA branches. Moderate stenosis left supraclinoid internal carotid artery due to calcific stenosis. Left middle cerebral artery widely patent. Anterior cerebral arteries patent bilaterally. Severe stenosis left P2 segment. Mild stenosis right posterior cerebral artery.  2D Echo EF 50-55%  TG 550->261, direct LDL 108.2   HgbA1c 13.5  VTE prophylaxis - lovenox  No antithrombotic prior to admission, treated w/ cangrelor post IR, loaded w/ Brilinta 180 and aspirin post op, now on aspirin 81 mg daily and Brilinta (ticagrelor) 90 mg bid.   Therapy recommendations:  SNF  Disposition:  pending   Cerebral edema  CT 7/15 showed  large right MCA infarct with regional mass-effect  No significant midline shift yet  We will need close monitoring  Repeat CTA head 7/17 -> see above  Hypertensive Emergency  BP as high as 260/140 on admission   Home meds:  None  Treated w max Cleviprex post IR for Post IR tight BP control ->  now off   now SBP goal < 180/105  on lisinopril 20  On hydralazine PRN . Long-term BP goal normotensive  Hyperlipidemia  Home meds:  None  Now on lipitor 80  TG 550->261, direct LDL 108.2, goal < 70  Continue statin at discharge  Diabetes type II Uncontrolled w/ Hyperglycemia  Home meds:  none  HgbA1c 13.5, goal < 7.0  CBGs  SSI 0-9 q4h  DB RN recommends add levemir 12u bid, Novolog 4u q4h TF coverage (added)  Dysphagia . Secondary to stroke . NPO . Speech on board . On cortrak and TF @ 74 and IVF   AKI   Cre 1.69->1.60->1.12-<1.14->1.42....1.26  Increase IVF to 50cc  Continue TF @ 70  BMP monitoring   Tobacco abuse  Current smoker  Smoking cessation counseling will be provided  Nicotine patch 14 mg daily added  Pt is willing to quit    Alcohol abuse  Heavy user as per sister  alcohol level <10  CIWA protocol   advised to drink no more than 2 drink(s) a day  Close monitor for alcohol withdraw  Cocaine abuse  Pt admitted using cocaine  Cessation education provided  Pt is willing to quit  Avoid beta blocker at this time  Other Stroke Risk Factors  Advanced age  Obesity, Body mass index is 32.26 kg/m., recommend weight loss, diet and exercise as appropriate   Other Active Problems  Hypokalemia, K 3.4->3.3->3.0->3.8->3.4 - supplement ............... 3.9  Hypomagnesemia, Mg 1.4 - supplemented - 1.8 - resolved - 1.9  Hospital day # 6 Continue ongoing treatment.  Continue tube feeding until he is able to swallow.  Likely transfer to skilled nursing facility next weeks after he is able to swallow or PEG tube.  No family available  for discussion today.  I left a message on patient's sister who is next of kin's answering machine to call me back to discuss his condition  .plan we will check chest x-ray tomorrow.  Greater than 50% time during this 25-minute visit was spent on counseling and coordination of care and discussion with care team about his stroke and dysphagia and answering questions  Antony Contras, MD  To contact Stroke Continuity provider, please refer to [email or web address] After hours, contact General Neurology

## 2020-04-12 ENCOUNTER — Inpatient Hospital Stay (HOSPITAL_COMMUNITY): Payer: 59

## 2020-04-12 LAB — GLUCOSE, CAPILLARY
Glucose-Capillary: 190 mg/dL — ABNORMAL HIGH (ref 70–99)
Glucose-Capillary: 195 mg/dL — ABNORMAL HIGH (ref 70–99)
Glucose-Capillary: 196 mg/dL — ABNORMAL HIGH (ref 70–99)
Glucose-Capillary: 204 mg/dL — ABNORMAL HIGH (ref 70–99)
Glucose-Capillary: 210 mg/dL — ABNORMAL HIGH (ref 70–99)
Glucose-Capillary: 215 mg/dL — ABNORMAL HIGH (ref 70–99)

## 2020-04-12 MED ORDER — INSULIN DETEMIR 100 UNIT/ML ~~LOC~~ SOLN
14.0000 [IU] | Freq: Two times a day (BID) | SUBCUTANEOUS | Status: DC
  Administered 2020-04-12 – 2020-04-14 (×4): 14 [IU] via SUBCUTANEOUS
  Filled 2020-04-12 (×5): qty 0.14

## 2020-04-12 MED ORDER — FREE WATER
200.0000 mL | Status: DC
  Administered 2020-04-12 – 2020-04-14 (×12): 200 mL

## 2020-04-12 NOTE — Consult Note (Addendum)
Rodney Estrada 10/01/1950  [id number].    Requesting MD: Dr. Leonie Man Chief Complaint/Reason for Consult: Dysphagia secondary to stroke.  Request for PEG consult  HPI: Rodney Estrada is a 69 y.o. male who presented on 7/12 with facial droop, hemiplegia and hypertension emergency.  Patient was found to have right MCA infarct and underwent TPA and thrombectomy, intracranial angioplasty and stenting.  During hospital stay patient has worked with therapies and had dysphagia.  Currently getting tube feeds via cortrak.   Patient is on no medications prior to admission.  History now significant for hyperlipidemia, diabetes (A1c 13.5), and AKI.  No prior abdominal surgeries.  He is currently on aspirin and Brilinta.  Does have history of cocaine and alcohol abuse prior to admission.  Lives at home alone.  PT/OT currently recommending SNF.  Reports family include sister.  ROS: Review of Systems  Constitutional: Negative for chills and fever.  Respiratory: Negative for cough.   Cardiovascular: Negative for chest pain.  Gastrointestinal: Negative for abdominal pain.  Neurological: Positive for weakness.  All other systems reviewed and are negative.   Family History  Problem Relation Age of Onset  . Hypertension Mother   . Hypertension Father     History reviewed. No pertinent past medical history.  Past Surgical History:  Procedure Laterality Date  . IR CT HEAD LTD  04/05/2020  . IR INTRA CRAN STENT  04/05/2020  . IR PERCUTANEOUS ART THROMBECTOMY/INFUSION INTRACRANIAL INC DIAG ANGIO  04/05/2020      . IR PERCUTANEOUS ART THROMBECTOMY/INFUSION INTRACRANIAL INC DIAG ANGIO  04/05/2020  . RADIOLOGY WITH ANESTHESIA N/A 04/05/2020   Procedure: IR WITH ANESTHESIA;  Surgeon: Radiologist, Medication, MD;  Location: [facility];  Service: Radiology;  Laterality: N/A;    Social History:  reports that he has been smoking. He has never used smokeless tobacco. He reports current alcohol use of about 10.0 standard  drinks of alcohol per week. He reports previous drug use.  Allergies: No Known Allergies  No medications prior to admission.     Physical Exam: Blood pressure (!) 154/83, pulse 70, temperature 98.7 F (37.1 C), temperature source Axillary, resp. rate 19, height 5\' 9"  (1.753 m), weight 99.1 kg, SpO2 98 %. General: pleasant, WD/WN white male who is laying in bed in NAD HEENT: head is normocephalic, atraumatic.  Sclera are noninjected.  PERRL.  Ears and nose without any masses or lesions.  Mouth is pink and moist. Dentition fair Heart: regular, rate, and rhythm.  No obvious murmurs.  Palpable pedal pulses bilaterally  Lungs: CTAB, no wheezes, rhonchi, or rales noted.  Respiratory effort nonlabored Abd: Soft, NT/ND, +BS, no masses, hernias, or organomegaly MS: no BUE/BLE edema, calves soft and nontender Skin: warm and dry with no masses, lesions, or rashes Psych: A&Ox4 with an appropriate affect Neuro: Dysarthria noted. Follows simple commands. Orientated to self, time, place, and situation. Decreased strength of LUE and LLE 2/2 CVA.   Results for orders placed or performed during the hospital encounter of 04/05/20 (from the past 48 hour(s))  Glucose, capillary     Status: Abnormal   Collection Time: 04/10/20  4:06 PM  Result Value Ref Range   Glucose-Capillary 209 (H) 70 - 99 mg/dL    Comment: Glucose reference range applies only to samples taken after fasting for at least 8 hours.  Glucose, capillary     Status: Abnormal   Collection Time: 04/10/20  7:18 PM  Result Value Ref Range   Glucose-Capillary 238 (H) 70 -  99 mg/dL    Comment: Glucose reference range applies only to samples taken after fasting for at least 8 hours.   Comment 1 Notify RN    Comment 2 Document in Chart   Glucose, capillary     Status: Abnormal   Collection Time: 04/10/20 11:04 PM  Result Value Ref Range   Glucose-Capillary 246 (H) 70 - 99 mg/dL    Comment: Glucose reference range applies only to samples taken  after fasting for at least 8 hours.   Comment 1 Notify RN    Comment 2 Document in Chart   Glucose, capillary     Status: Abnormal   Collection Time: 04/11/20  3:12 AM  Result Value Ref Range   Glucose-Capillary 234 (H) 70 - 99 mg/dL    Comment: Glucose reference range applies only to samples taken after fasting for at least 8 hours.   Comment 1 Notify RN    Comment 2 Document in Chart   Basic metabolic panel     Status: Abnormal   Collection Time: 04/11/20  4:35 AM  Result Value Ref Range   Sodium 142 135 - 145 mmol/L   Potassium 3.9 3.5 - 5.1 mmol/L   Chloride 109 98 - 111 mmol/L   CO2 23 22 - 32 mmol/L   Glucose, Bld 233 (H) 70 - 99 mg/dL    Comment: Glucose reference range applies only to samples taken after fasting for at least 8 hours.   BUN 29 (H) 8 - 23 mg/dL   Creatinine, Ser 1.26 (H) 0.61 - 1.24 mg/dL   Calcium 9.4 8.9 - 10.3 mg/dL   GFR calc non Af Amer 59 (L) >60 mL/min   GFR calc Af Amer >60 >60 mL/min   Anion gap 10 5 - 15    Comment: Performed at [facility] [address], [place], [place] [address]  CBC with Differential/Platelet     Status: None   Collection Time: 04/11/20  4:35 AM  Result Value Ref Range   WBC 9.7 4.0 - 10.5 K/uL   RBC 4.67 4.22 - 5.81 MIL/uL   Hemoglobin 13.6 13.0 - 17.0 g/dL   HCT 42.7 39 - 52 %   MCV 91.4 80.0 - 100.0 fL   MCH 29.1 26.0 - 34.0 pg   MCHC 31.9 30.0 - 36.0 g/dL   RDW 14.2 11.5 - 15.5 %   Platelets 239 150 - 400 K/uL   nRBC 0.0 0.0 - 0.2 %   Neutrophils Relative % 66 %   Neutro Abs 6.4 1.7 - 7.7 K/uL   Lymphocytes Relative 19 %   Lymphs Abs 1.8 0.7 - 4.0 K/uL   Monocytes Relative 10 %   Monocytes Absolute 1.0 0 - 1 K/uL   Eosinophils Relative 4 %   Eosinophils Absolute 0.4 0 - 0 K/uL   Basophils Relative 0 %   Basophils Absolute 0.0 0 - 0 K/uL   Immature Granulocytes 1 %   Abs Immature Granulocytes 0.05 0.00 - 0.07 K/uL    Comment: Performed at [facility], [address], [place], [place] [address]   Glucose, capillary     Status: Abnormal   Collection Time: 04/11/20  8:23 AM  Result Value Ref Range   Glucose-Capillary 216 (H) 70 - 99 mg/dL    Comment: Glucose reference range applies only to samples taken after fasting for at least 8 hours.  Glucose, capillary     Status: Abnormal   Collection Time: 04/11/20 11:42  AM  Result Value Ref Range   Glucose-Capillary 197 (H) 70 - 99 mg/dL    Comment: Glucose reference range applies only to samples taken after fasting for at least 8 hours.  Glucose, capillary     Status: Abnormal   Collection Time: 04/11/20  4:26 PM  Result Value Ref Range   Glucose-Capillary 218 (H) 70 - 99 mg/dL    Comment: Glucose reference range applies only to samples taken after fasting for at least 8 hours.  Glucose, capillary     Status: Abnormal   Collection Time: 04/11/20  7:24 PM  Result Value Ref Range   Glucose-Capillary 185 (H) 70 - 99 mg/dL    Comment: Glucose reference range applies only to samples taken after fasting for at least 8 hours.   Comment 1 Notify RN    Comment 2 Document in Chart   Glucose, capillary     Status: Abnormal   Collection Time: 04/11/20 11:26 PM  Result Value Ref Range   Glucose-Capillary 236 (H) 70 - 99 mg/dL    Comment: Glucose reference range applies only to samples taken after fasting for at least 8 hours.   Comment 1 Notify RN    Comment 2 Document in Chart   Glucose, capillary     Status: Abnormal   Collection Time: 04/12/20  4:37 AM  Result Value Ref Range   Glucose-Capillary 204 (H) 70 - 99 mg/dL    Comment: Glucose reference range applies only to samples taken after fasting for at least 8 hours.  Glucose, capillary     Status: Abnormal   Collection Time: 04/12/20  8:41 AM  Result Value Ref Range   Glucose-Capillary 190 (H) 70 - 99 mg/dL    Comment: Glucose reference range applies only to samples taken after fasting for at least 8 hours.  Glucose, capillary     Status: Abnormal   Collection Time: 04/12/20 11:28 AM   Result Value Ref Range   Glucose-Capillary 210 (H) 70 - 99 mg/dL    Comment: Glucose reference range applies only to samples taken after fasting for at least 8 hours.   DG Chest 2 View  Result Date: 04/12/2020 CLINICAL DATA:  History of CVA one week ago. EXAM: CHEST - 2 VIEW COMPARISON:  None. FINDINGS: The heart is mildly enlarged. Mild tortuosity and calcification of the thoracic aorta. Feeding tube is coursing down the esophagus and into the stomach. Streaky bibasilar atelectasis but no infiltrates, edema or effusions. IMPRESSION: Mild cardiac enlargement and streaky bibasilar atelectasis. Electronically Signed   By: Marijo Sanes M.D.   On: 04/12/2020 07:57   Anti-infectives (From admission, onward)   None       Assessment/Plan CVA DM2 HTN HLD Cocaine abuse Etoh abuse  Dysphagia - Patient with dysphagia 2/2 stroke. Patient is agreeable to proceed with PEG placement. I attempted to contact patients sister without answer. Patients needs to have Brilinta held for 5 days before PEG. Will plan for sometime next week.   Jillyn Ledger, [facility] 04/12/2020, 3:22 PM Please see Amion for pager number during day hours 7:00am-4:30pm

## 2020-04-12 NOTE — Progress Notes (Signed)
Physical Therapy Treatment [name] MRN: [id number] DOB: 1951-07-06 Today's Date: 04/12/2020    History of Present Illness Patient is a 69 y/o male who presents with acute CVA s/p cerebral arteriogram with emergent mechanical thrombectomy of right MCA M1 occlusion along with stent assisted angioplasty of right MCA M1 04/05/20. s/p tpA. + cocaine. 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 No PMH.    PT Comments    Patient progressing with mobility slowly.  Today able to sit briefly with S with mod cues to maintain R lateral lean and head upright.  He participated with scooting/squat pivot to recliner to R side reaching with R UE.  Still unaware of L side and limited by attention and awareness deficits.  Fatigued with activity.  Feel he remains appropriate for SNF level rehab at d/c.   Follow Up Recommendations  SNF     Equipment Recommendations  Wheelchair (measurements PT);Wheelchair cushion (measurements PT);Hospital bed;Other (comment)    Recommendations for Other Services       Precautions / Restrictions Precautions Precautions: Fall;Other (comment) Precaution Comments: NG tube; SBP <180    Mobility  Bed Mobility Overal bed mobility: Needs Assistance Bed Mobility: Supine to Sit     Supine to sit: Max assist     General bed mobility comments: pt moving R leg off bed, assist for L LE and to lift trunk pt pulling with R hand, increased time after cues to initiate  Transfers Overall transfer level: Needs assistance   Transfers: Squat Pivot Transfers     Squat pivot transfers: Mod assist;+2 physical assistance     General transfer comment: assist to pivot to drop arm recliner leading to R reaching with R hand to armrest, increased time once in chair to scoot back and position for safety  Ambulation/Gait                 Stairs              Wheelchair Mobility    Modified Rankin (Stroke Patients Only) Modified Rankin (Stroke Patients Only) Pre-Morbid Rankin Score: No symptoms Modified Rankin: Severe disability     Balance Overall balance assessment: Needs assistance Sitting-balance support: Feet supported;Single extremity supported;No upper extremity supported Sitting balance-Leahy Scale: Poor Sitting balance - Comments: leaning to L initially, able to sustaing balance with close S leaning on R UE and mod cues x 10 -15 seconds       Standing balance comment: NT this session                            Cognition Arousal/Alertness: Awake/alert Behavior During Therapy: Flat affect Overall Cognitive Status: Impaired/Different from baseline Area of Impairment: Attention;Problem solving;Memory;Following commands;Safety/judgement                   Current Attention Level: Focused Memory: Decreased short-term memory Following Commands: Follows one step commands consistently Safety/Judgement: Decreased awareness of safety;Decreased awareness of deficits   Problem Solving: Slow processing;Decreased initiation;Requires verbal cues        Exercises      General Comments General comments (skin integrity, edema, etc.): sister in the room initially, SpO@ on RA end of session 96%      Pertinent Vitals/Pain Pain Assessment: No/denies pain Faces Pain Scale: Hurts little more Pain Location: R knee Pain Descriptors / Indicators: Tightness Pain Intervention(s): Monitored during session;Repositioned  Home Living                      Prior Function            PT Goals (current goals can now be found in the care plan section) Progress towards PT goals: Progressing toward goals    Frequency    Min 3X/week      PT Plan Current plan remains appropriate    Co-evaluation              AM-PAC PT "6 Clicks" Mobility   Outcome Measure  Help needed turning from your back to  your side while in a flat bed without using bedrails?: A Lot Help needed moving from lying on your back to sitting on the side of a flat bed without using bedrails?: Total Help needed moving to and from a bed to a chair (including a wheelchair)?: Total Help needed standing up from a chair using your arms (e.g., wheelchair or bedside chair)?: Total Help needed to walk in hospital room?: Total Help needed climbing 3-5 steps with a railing? : Total 6 Click Score: 7    End of Session Equipment Utilized During Treatment: Gait belt Activity Tolerance: Patient tolerated treatment well Patient left: in chair;with call bell/phone within reach;with chair alarm set Nurse Communication: Mobility status PT Visit Diagnosis: Hemiplegia and hemiparesis;Difficulty in walking, not elsewhere classified (R26.2);Unsteadiness on feet (R26.81) Hemiplegia - Right/Left: Left Hemiplegia - dominant/non-dominant: Non-dominant Hemiplegia - caused by: Cerebral infarction     Time: 3664-4034 PT Time Calculation (min) (ACUTE ONLY): 21 min  Charges:  $Therapeutic Activity: 8-22 mins                     Magda Kiel, PT Acute Rehabilitation Services Pager:[phone] [phone] 04/12/2020    Reginia Naas 04/12/2020, 2:25 PM

## 2020-04-12 NOTE — Progress Notes (Signed)
Occupational Therapy Treatment [name] MRN: [id number] DOB: [birth year] Today's Date: 04/12/2020    History of present illness Patient is a 69 y/o male who presents with acute CVA s/p cerebral arteriogram with emergent mechanical thrombectomy of right MCA M1 occlusion along with stent assisted angioplasty of right MCA M1 04/05/20. s/p tpA. + cocaine. 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 No PMH.   OT comments  Upon arrival, pt up in chair and agreeable to skilled OT services. Performed PROM to L UE and noted tone in triceps. Pt able to don lotion to B LE's and LUE with min multimodal cues and max A to return trunk to midline. Pt participated in tabletop scanning and reaching activity with max A for trunk support with weightbearing with L UE through therapist's leg. Pt able to locate 1/4 (on R side) items with min multimodal cues and 2/4 (1 item on L side) items with max multimodal cues. Unable to follow multiple step commands during reaching activity. Pt reported he needed to have a BM. Pt showing decreased awareness of deficits and safety with requesting to use toilet in bathroom despite impairments. Pt needed Max A +2 to perform lateral scoot transfer from recliner (with drop arm) to bed. Total - Mod A +2 with rolling to perform bed pan toileting and Total A +2 to preform perineal care at bed level. Pt was left in bed using bed pan with nurse notified and attending to pt. Believe dc plan remains appropriate - will follow acutely.    Follow Up Recommendations  SNF    Equipment Recommendations  Wheelchair (measurements OT);Wheelchair cushion (measurements OT);Hospital bed       Precautions / Restrictions Precautions Precautions: None;Other (comment) Precaution Comments: NG tube; SBP <180       Mobility Bed Mobility Overal bed mobility: Needs Assistance Bed Mobility: Sit to  Supine;Rolling Rolling: Mod assist;+2 for physical assistance;Total assist   Supine to sit: Max assist Sit to supine: Max assist;+2 for physical assistance   General bed mobility comments: Pt needing Total A to roll to R side and Mod A to roll to L side with max multimodal cueing. Max A +2 from sit to supine to lower trunk and elevate legs. Pt has a tendancy to move R leg off of bed.   Transfers Overall transfer level: Needs assistance   Transfers: Lateral/Scoot Transfers     Squat pivot transfers: Mod assist;+2 physical assistance    Lateral/Scoot Transfers: Max assist;+2 physical assistance General transfer comment: Max A +2 to scoot from recliner to EOB on R side with drop arm    Balance Overall balance assessment: Needs assistance Sitting-balance support: Feet supported;Single extremity supported;No upper extremity supported Sitting balance-Leahy Scale: Zero Sitting balance - Comments: Leaning to R in chair and to L without support. Needing Max A for trunk support and L UE weightbearing on therapists leg for feedback with dynamic reaching activity Postural control: Posterior lean;Right lateral lean;Left lateral lean     Standing balance comment: NT this session                           ADL either performed or assessed with clinical judgement   ADL Overall ADL's : Needs assistance/impaired             Lower Body Bathing: Maximal assistance;Sitting/lateral leans Lower Body Bathing Details (indicate cue type and reason): Pt able  to don lotion to lower extremities with multimodal cues and max A to bring back to midline after bending over         Toilet Transfer: Maximal assistance;+2 for safety/equipment;+2 for physical assistance;Requires drop arm;Cueing for sequencing (lateral scoots to R side to use bedpan ) Toilet Transfer Details (indicate cue type and reason): Scooted from chair with drop arm to bed to use bed pan with max A +2 for assist and  safety Toileting- Clothing Manipulation and Hygiene: +2 for physical assistance;Bed level;Total assistance Toileting - Clothing Manipulation Details (indicate cue type and reason): Total assist +2 for perineal care after BM       General ADL Comments: Max A - Total A +2 due to weakness and incoordination on L side     Vision   Vision Assessment?: Vision impaired- to be further tested in functional context Additional Comments: Pt with R gaze preference. Able to turn head to left side to scan with max multimodal cueing           Cognition Arousal/Alertness: Awake/alert Behavior During Therapy: Flat affect Overall Cognitive Status: Impaired/Different from baseline Area of Impairment: Attention;Problem solving;Memory;Following commands;Safety/judgement                   Current Attention Level: Focused Memory: Decreased short-term memory Following Commands: Follows one step commands consistently;Follows multi-step commands inconsistently Safety/Judgement: Decreased awareness of safety;Decreased awareness of deficits   Problem Solving: Slow processing;Decreased initiation;Requires verbal cues;Requires tactile cues;Difficulty sequencing General Comments: Pt alert and oriented needing max mulimodal cues for problem solving and sequencing. Pt following one step commands consistently but cannot follow multi-step commands. Decreased safety awareness asking to use toilet in bathroom despite being fatigued and having increased weakness on affected side.        Exercises Exercises: Other exercises Other Exercises Other Exercises: PROM to LUE: shoulder flex/ext, elbow flex/ext, wrist flex/ext, thumb abd/add, thumb flex/ext, finger flex/ext, forearm sup/pro       General Comments Pt left on bed pan at end of session - nurse notified and attending to pt    Pertinent Vitals/ Pain       Pain Assessment: Faces Faces Pain Scale: Hurts little more Pain Location: L shoulder Pain  Descriptors / Indicators: Discomfort;Grimacing Pain Intervention(s): Monitored during session;Repositioned         Frequency  Min 2X/week        Progress Toward Goals  OT Goals(current goals can now be found in the care plan section)  Progress towards OT goals: Progressing toward goals  Acute Rehab OT Goals Patient Stated Goal: not stated Time For Goal Achievement: 04/21/20 Potential to Achieve Goals: Good  Plan Discharge plan remains appropriate       AM-PAC OT "6 Clicks" Daily Activity     Outcome Measure   Help from another person eating meals?: A Lot Help from another person taking care of personal grooming?: A Lot Help from another person toileting, which includes using toliet, bedpan, or urinal?: Total Help from another person bathing (including washing, rinsing, drying)?: Total Help from another person to put on and taking off regular upper body clothing?: A Lot Help from another person to put on and taking off regular lower body clothing?: Total 6 Click Score: 9    End of Session Equipment Utilized During Treatment: Gait belt  OT Visit Diagnosis: Unsteadiness on feet (R26.81);Muscle weakness (generalized) (M62.81)   Activity Tolerance Patient limited by fatigue   Patient Left in bed;with call bell/phone within reach;with bed  alarm set   Nurse Communication Mobility status;Other (comment) (BM and bed pan)        Time: 7169-6789 OT Time Calculation (min): 39 min  Charges: OT General Charges $OT Visit: 1 Visit OT Treatments $Self Care/Home Management : 8-22 mins $Therapeutic Activity: 8-22 mins $Neuromuscular Re-education: 8-22 mins  Michaelann Gunnoe/OTS   Elina Streng 04/12/2020, 4:09 PM

## 2020-04-12 NOTE — Progress Notes (Signed)
Inpatient Diabetes Program Recommendations  AACE/ADA: New Consensus Statement on Inpatient Glycemic Control (2015)  Target Ranges:  Prepandial:   less than 140 mg/dL      Peak postprandial:   less than 180 mg/dL (1-2 hours)      Critically ill patients:  140 - 180 mg/dL   Lab Results  Component Value Date   GLUCAP 210 (H) 04/12/2020   HGBA1C 13.5 (H) 04/06/2020    Review of Glycemic Control Results for Rodney Estrada, Rodney Estrada (MRN [id number]) as of 04/12/2020 12:20  Ref. Range 04/11/2020 23:26 04/12/2020 04:37 04/12/2020 08:41 04/12/2020 11:28  Glucose-Capillary Latest Ref Range: 70 - 99 mg/dL 236 (H) 204 (H) 190 (H) 210 (H)   Diabetes history: None, New Diagnosis  Not appropriate for education at this time.   Current orders for Inpatient glycemic control:  Levemir 12 units bid Novolog 0-15 units Q4 hours  A1c 13.5% Jevity 70 ml/hour  Inpatient Diabetes Program Recommendations:    Consider increasing Levemir to 14 units BID.   Thanks, Bronson Curb, MSN, RNC-OB Diabetes Coordinator [phone] (8a-5p)

## 2020-04-12 NOTE — Progress Notes (Signed)
°  Speech Language Pathology Treatment: Dysphagia  [name] MRN: [id number] DOB: 1951-03-07 Today's Date: 04/12/2020 Time: 1400-1410 SLP Time Calculation (min) (ACUTE ONLY): 10 min  Assessment / Plan / Recommendation Clinical Impression  Pt was encountered asleep in a recliner chair with Cortrak in place.  Pt roused to moderate verbal stimulation and he was eager to try PO trials.  Oral care was completed prior to and following PO trials.  Pt's oral cavity was noted to have dried blood with thick, tan secretions.  Following oral care, pt consumed trials of ice chips, thin liquid via tsp/straw, nectar-thick liquid via straw, and puree.  He exhibited good labial closure around the spoon and straw, and AP transport appeared to be timely with liquid trials.  A delayed, strong cough was observed following 1/4 trials of thin liquid via straw, but no additional overt s/sx of aspiration were observed with any trials.  Recommend a modified barium swallow study (MBS) to further evaluate swallow function and to help determine the safest/least restrictive diet.  Will plan for [facility] tomorrow pending pt's level of alertness.  Additionally recommend that pt remain NPO until MBS is completed, excluding a few small ice chips after thorough oral care (5-10 per day) given full supervision.  Pt will benefit from frequent oral care. Spoke with pt and RN regarding all recommendations.  SLP will f/u per POC.     HPI HPI: Patient is a 69 y/o male who presents with acute CVA s/p cerebral arteriogram with emergent mechanical thrombectomy of right MCA M1 occlusion along with stent assisted angioplasty of right MCA M1 04/05/20. s/p tpA. + cocaine. 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 No PMH.      SLP Plan  MBS       Recommendations  Diet recommendations: NPO (Pt may have a few small ice chips PRN after oral  care ) Liquids provided via: Teaspoon Medication Administration: Via alternative means Supervision: Full supervision/cueing for compensatory strategies;Staff to assist with self feeding Compensations: Minimize environmental distractions;Small sips/bites Postural Changes and/or Swallow Maneuvers: Seated upright 90 degrees                Oral Care Recommendations: Oral care QID;Oral care prior to ice chip/H20;Staff/trained caregiver to provide oral care Follow up Recommendations: Skilled Nursing facility SLP Visit Diagnosis: Dysphagia, oropharyngeal phase (R13.12) Plan: MBS       GO               Colin Mulders., M.S., [facility] Office: [phone]  [place] 04/12/2020, 2:20 PM

## 2020-04-12 NOTE — Progress Notes (Signed)
STROKE TEAM PROGRESS NOTE   INTERVAL HISTORY His wife is at the bedside. She is agreeable to PEG placement.  Continues to have significant hemiplegia with dysphagia and speech therapy still recommend n.p.o. status.  Vital signs are stable.  Chest x-ray shows mild bilateral atelectasis but no infiltrates or pneumonia.  Neuro exam is unchanged  Vitals:   04/11/20 2038 04/12/20 0022 04/12/20 0437 04/12/20 0755  BP: (!) 175/112 (!) 182/113 (!) 162/94   Pulse: 82 82 80 70  Resp: 20 (!) 21 15 16   Temp: 98.5 F (36.9 C) 98.2 F (36.8 C) 99 F (37.2 C) 98.7 F (37.1 C)  TempSrc: Axillary Axillary Axillary Axillary  SpO2: 99% 99% 97% 99%  Weight:      Height:       CBC:  Recent Labs  Lab 04/05/20 1049 04/05/20 1056 04/10/20 0622 04/11/20 0435  WBC 9.0   < > 10.5 9.7  NEUTROABS 5.2  --   --  6.4  HGB 16.0   < > 14.8 13.6  HCT 46.7   < > 46.6 42.7  MCV 84.8   < > 91.2 91.4  PLT 205   < > 243 239   < > = values in this interval not displayed.   Basic Metabolic Panel:  Recent Labs  Lab 04/08/20 0744 04/08/20 1659 04/09/20 0351 04/10/20 0622 04/11/20 0435  NA 141  --    < > 143 142  K 3.8  --    < > 3.6 3.9  CL 107  --    < > 111 109  CO2 23  --    < > 19* 23  GLUCOSE 255*  --    < > 194* 233*  BUN 9  --    < > 30* 29*  CREATININE 1.14  --    < > 1.28* 1.26*  CALCIUM 8.8*  --    < > 9.3 9.4  MG 1.8 1.9  --   --   --   PHOS 2.7 2.8  --   --   --    < > = values in this interval not displayed.   Lipid Panel:  Recent Labs  Lab 04/06/20 0613 04/06/20 0613 04/07/20 0421  CHOL 206*  --   --   TRIG 550*   < > 261*  HDL 31*  --   --   CHOLHDL 6.6  --   --   VLDL UNABLE TO CALCULATE IF TRIGLYCERIDE OVER 400 mg/dL  --   --   LDLCALC UNABLE TO CALCULATE IF TRIGLYCERIDE OVER 400 mg/dL  --   --    < > = values in this interval not displayed.   HgbA1c:  Recent Labs  Lab 04/06/20 0613  HGBA1C 13.5*   Urine Drug Screen:  Recent Labs  Lab 04/05/20 1956  LABOPIA NONE  DETECTED  COCAINSCRNUR POSITIVE*  LABBENZ NONE DETECTED  AMPHETMU NONE DETECTED  THCU NONE DETECTED  LABBARB NONE DETECTED    Alcohol Level  Recent Labs  Lab 04/05/20 1049  ETH <10    IMAGING past 24 hours DG Chest 2 View  Result Date: 04/12/2020 CLINICAL DATA:  History of CVA one week ago. EXAM: CHEST - 2 VIEW COMPARISON:  None. FINDINGS: The heart is mildly enlarged. Mild tortuosity and calcification of the thoracic aorta. Feeding tube is coursing down the esophagus and into the stomach. Streaky bibasilar atelectasis but no infiltrates, edema or effusions. IMPRESSION: Mild cardiac enlargement and streaky bibasilar atelectasis. Electronically  Signed   By: Marijo Sanes M.D.   On: 04/12/2020 07:57     PHYSICAL EXAM   Temp:  [98.2 F (36.8 C)-99.1 F (37.3 C)] 98.7 F (37.1 C) (07/19 0755) Pulse Rate:  [70-82] 70 (07/19 0755) Resp:  [15-21] 16 (07/19 0755) BP: (162-182)/(94-113) 162/94 (07/19 0437) SpO2:  [97 %-99 %] 99 % (07/19 0755)  General - Well nourished, well developed, awake and interactive Ophthalmologic - fundi not visualized due to noncooperation.  Cardiovascular - Regular rhythm and rate.  Neuro -awake and alert, severe dysarthria, oriented to place and people, not to time, able to follow simple commands. Right gaze preference but able to barely cross midline, blinking to visual threat on the right, but not to the left. Left facial droop. Tongue midline. Left UE flaccid. Left LE 2-/5 proximal and 0/5 distal. RUE 4/5 and RLE 4/5  Left babinski positive. Sensation, coordination not cooperative and gait not tested.   ASSESSMENT/PLAN Mr. Ancil Dewan is a 70 y.o. male with history of tobacco use presenting with L sided flaccidity, R gaze, L facial droop, periods of apnea in setting of BP 260/140. IV tPA administered 04/05/2020 at 1116. Taken to IR for R M1 occlusion.   Stroke: R MCA infarct s/p tPA and evt w/ complete revascularization of R M1 following angioplasty w/  stent placement, infarct secondary to large vessel disease  Code Stroke CT head No acute abnormality. Numerous chronic lacunes B basal ganglia, R thalamic, white matter. ASPECTS 10.     CTA head R M1 occlusion. Intracranial B ICA moderate stenosis. B P2 w/ moderate stenoses.  CTA neck proximal L ICA 40-50% stenosis. R ICA bifurcation & proximal w/ mixed plaque. Mild to moderate distal cervical R ICA. L VA origin moderate stenosis.  Cerebral angio R M1 occlusion s/p EVT and R M1 stent placed w/ complete revascularization     CT head 7/12 1715 developing R MCA infarct. Mild hyperdensity R putamen, ? Contrast vs hemorrhage.   MRI  Evolving R large MCA infarct (basal ganglia, insula, temporal, frontal lobes). Minimal petechial hemorrhage.   MRA  interval R M1 stent patent  CT repeat 7/15 stable extensive R MCA infarct w/ regional mass effect and partial effacement R temporal horn, mild petechial hemorrhage. R M1 MCA stent, asymmetric hyperdense R M2.  CT and CTA 04/10/20 - Subacute infarct right MCA territory with decreased cytotoxic edema. No hemorrhage or new infarct. 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 Right MCA stent is patent with good flow to right MCA branches. Moderate stenosis left supraclinoid internal carotid artery due to calcific stenosis. Left middle cerebral artery widely patent. Anterior cerebral arteries patent bilaterally. Severe stenosis left P2 segment. Mild stenosis right posterior cerebral artery.  2D Echo EF 50-55%  TG 550->261, direct LDL 108.2   HgbA1c 13.5  VTE prophylaxis - lovenox  No antithrombotic prior to admission, treated w/ cangrelor post IR, loaded w/ Brilinta 180 and aspirin post op, now on aspirin 81 mg daily and Brilinta (ticagrelor) 90 mg bid.   Therapy recommendations:  SNF  Disposition:  pending   Cerebral edema, resolving  CT 7/15 showed large right MCA infarct with regional  mass-effect  No significant midline shift yet  Repeat CTA head 7/17 -> decreasing edema  Hypertensive Emergency  BP as high as 260/140 on admission   Home meds:  None  Treated w max Cleviprex post IR for Post IR tight BP control -> now off  SBP goal < 180/105  on lisinopril 20  On hydralazine PRN . Long-term BP goal normotensive  Hyperlipidemia  Home meds:  None  Now on lipitor 80  TG 550->261, direct LDL 108.2, goal < 70  Continue statin at discharge  Diabetes type II Uncontrolled w/ Hyperglycemia  Home meds:  none  HgbA1c 13.5, goal < 7.0  CBGs  SSI 0-9 q4h  DB RN following  On Levemir 12u->14u bid  On Novolog 4u q4h TF coverage   Dysphagia . Secondary to stroke . NPO . Speech on board . On cortrak and TF @ 23 and IVF . Wife agreeable to PEG for placement.  . Trauma consulted.   AKI   Cre 1.28-1.26   D/c IVF->NSL.   Add free water 200 q4h  Continue TF @ 70  BMP monitoring   Tobacco abuse  Current smoker  Smoking cessation counseling will be provided  Nicotine patch 14 mg daily added  Pt is willing to quit    Alcohol abuse  Heavy user as per sister  alcohol level <10  CIWA protocol   advised to drink no more than 2 drink(s) a day  Close monitor for alcohol withdraw  Cocaine abuse  Pt admitted using cocaine  Cessation education provided  Pt is willing to quit  Avoid beta blocker at this time  Other Stroke Risk Factors  Advanced age  Obesity, Body mass index is 32.26 kg/m., recommend weight loss, diet and exercise as appropriate   Other Active Problems  Hypokalemia - resolved   Hypomagnesemia - resolved  Hospital day # 7  Patient remains with significant left hemiplegia dysarthria and dysphagia and is unlikely to be able to swallow safely anytime soon hence placing a PEG tube will be necessary.  Long discussion with the patient as well as wife at the bedside and answered questions.  Will consult trauma  team for PEG tube.  Continue ongoing therapies and medical management.  Hopefully transfer to skilled nursing facility after PEG tube by the end of the week.  Greater than 50% time during this 25-minute visit was spent on counseling and coordination of care about his stroke, hemiplegia and dysphagia and answering questions  Antony Contras, MD  To contact Stroke Continuity provider, please refer to [email or web address] After hours, contact General Neurology

## 2020-04-13 ENCOUNTER — Inpatient Hospital Stay (HOSPITAL_COMMUNITY): Payer: 59

## 2020-04-13 LAB — BASIC METABOLIC PANEL
Anion gap: 11 (ref 5–15)
BUN: 28 mg/dL — ABNORMAL HIGH (ref 8–23)
CO2: 25 mmol/L (ref 22–32)
Calcium: 9.7 mg/dL (ref 8.9–10.3)
Chloride: 107 mmol/L (ref 98–111)
Creatinine, Ser: 1.15 mg/dL (ref 0.61–1.24)
GFR calc Af Amer: >60 mL/min (ref >60)
GFR calc non Af Amer: >60 mL/min (ref >60)
Glucose, Bld: 203 mg/dL — ABNORMAL HIGH (ref 70–99)
Potassium: 3.5 mmol/L (ref 3.5–5.1)
Sodium: 143 mmol/L (ref 135–145)

## 2020-04-13 LAB — CBC
HCT: 43.3 % (ref 39.0–52.0)
Hemoglobin: 13.7 g/dL (ref 13.0–17.0)
MCH: 28.7 pg (ref 26.0–34.0)
MCHC: 31.6 g/dL (ref 30.0–36.0)
MCV: 90.8 fL (ref 80.0–100.0)
Platelets: 305 10*3/uL (ref 150–400)
RBC: 4.77 MIL/uL (ref 4.22–5.81)
RDW: 13.7 % (ref 11.5–15.5)
WBC: 8.8 10*3/uL (ref 4.0–10.5)
nRBC: 0 % (ref 0.0–0.2)

## 2020-04-13 LAB — GLUCOSE, CAPILLARY
Glucose-Capillary: 196 mg/dL — ABNORMAL HIGH (ref 70–99)
Glucose-Capillary: 200 mg/dL — ABNORMAL HIGH (ref 70–99)
Glucose-Capillary: 200 mg/dL — ABNORMAL HIGH (ref 70–99)
Glucose-Capillary: 214 mg/dL — ABNORMAL HIGH (ref 70–99)
Glucose-Capillary: 226 mg/dL — ABNORMAL HIGH (ref 70–99)
Glucose-Capillary: 261 mg/dL — ABNORMAL HIGH (ref 70–99)

## 2020-04-13 MED ORDER — RESOURCE THICKENUP CLEAR PO POWD
ORAL | Status: DC | PRN
  Filled 2020-04-13: qty 125

## 2020-04-13 MED ORDER — PHENOL 1.4 % MT LIQD
2.0000 | OROMUCOSAL | Status: DC | PRN
  Filled 2020-04-13: qty 177

## 2020-04-13 NOTE — Progress Notes (Addendum)
Inpatient Diabetes Program Recommendations  AACE/ADA: New Consensus Statement on Inpatient Glycemic Control (2015)  Target Ranges:  Prepandial:   less than 140 mg/dL      Peak postprandial:   less than 180 mg/dL (1-2 hours)      Critically ill patients:  140 - 180 mg/dL   Lab Results  Component Value Date   GLUCAP 226 (H) 04/13/2020   HGBA1C 13.5 (H) 04/06/2020    Review of Glycemic Control Results for Rodney Estrada, Rodney Estrada (MRN [id number]) as of 04/13/2020 15:11  Ref. Range 04/12/2020 23:54 04/13/2020 04:05 04/13/2020 07:41 04/13/2020 12:23  Glucose-Capillary Latest Ref Range: 70 - 99 mg/dL 195 (H) 214 (H) 200 (H) 226 (H)   Diabetes history:None, New Diagnosis  Not appropriate for education at this time.   Current orders for Inpatient glycemic control: Levemir 14 units bid Novolog 0-15 units Q4 hours Novolog 4 units Q4H  A1c 13.5% Jevity 70 ml/hour  Inpatient Diabetes Program Recommendations:  Consider increasing Novolog 6 units Q4H (for tube feed coverage, to be stopped when tube feeds stopped).  Attempted to see, will reattempt at more appropriate time. No family at bedside.  Thanks, Bronson Curb, MSN, RNC-OB Diabetes Coordinator [phone] (8a-5p)

## 2020-04-13 NOTE — Progress Notes (Addendum)
Following for PEG consult. Will need Brilinta held. I have discussed with Dr. Leonie Man who is okay with that. Last dose yesterday at 2127. Tentatively plan for Thursday AM. I have attempted to call his sister again without answer.   Addendum: Spoke with patients sister, Caprice Renshaw, who gives consent for PEG tube placement.

## 2020-04-13 NOTE — Progress Notes (Signed)
  Speech Language Pathology Treatment: Dysphagia  [name] MRN: [id number] DOB: March 10, 1951 Today's Date: 04/13/2020 Time: 9381-0175 SLP Time Calculation (min) (ACUTE ONLY): 12 min  Assessment / Plan / Recommendation Clinical Impression  Pt was seen for dysphagia treatment. He was asleep upon SLP's entry but roused with min verbal and tactile stimulation. Verbal stimulation was intermittently needed for him to maintain alertness. No s/sx of aspiration were noted with any solids or liquids during this session. He demonstrated reduced labial stripping to the left with puree solids and left-sided anterior spillage was noted with thin liquids via cup. Mild oral residue was observed with puree boluses. Per EMR, the tentitive plan is for pt to have a PEG on Thursday. It is recommended that a modified barium swallow study be conducted to assess physiology and determine safety of p.o. intake. The study is currently scheduled for today at 13:30.    HPI HPI: Patient is a 69 y/o male who presents with acute CVA s/p cerebral arteriogram with emergent mechanical thrombectomy of right MCA M1 occlusion along with stent assisted angioplasty of right MCA M1 04/05/20. s/p tpA. + cocaine. 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 No PMH.      SLP Plan  MBS (Today at 1330)       Recommendations  Diet recommendations: NPO Liquids provided via:  (Ice chips iv tsp) Medication Administration: Via alternative means Supervision: Full supervision/cueing for compensatory strategies;Staff to assist with self feeding Compensations: Minimize environmental distractions;Small sips/bites Postural Changes and/or Swallow Maneuvers: Seated upright 90 degrees                Oral Care Recommendations: Oral care QID;Oral care prior to ice chip/H20;Staff/trained caregiver to provide oral care Follow up  Recommendations: Skilled Nursing facility SLP Visit Diagnosis: Dysphagia, oropharyngeal phase (R13.12) Plan: MBS (Today at 1330)       Emiline Mancebo I. Hardin Negus, [place], [place] Office number [phone] Pager [place] 04/13/2020, 12:05 PM

## 2020-04-13 NOTE — Progress Notes (Signed)
STROKE TEAM PROGRESS NOTE   INTERVAL HISTORY Patient is lying comfortably in bed.  No neurological changes.  Vital signs stable.  CBC and BMP today are normal.  Trauma team has evaluated patient and plan PEG tube placement later this week and Brilinta has been kept on hold for the procedure.  He is scheduled for modified barium swallow later today  Vitals:   04/12/20 1939 04/12/20 2355 04/13/20 0326 04/13/20 0735  BP: (!) 163/90 (!) 155/88 (!) 157/94 (!) 179/97  Pulse: 80 71 74 67  Resp: 16 15 15 11   Temp: 98.6 F (37 C) 98.5 F (36.9 C) 98.6 F (37 C) 97.8 F (36.6 C)  TempSrc: Oral Oral Axillary Oral  SpO2: 97% 98% 99% 99%  Weight:      Height:       CBC:  Recent Labs  Lab 04/11/20 0435 04/13/20 0335  WBC 9.7 8.8  NEUTROABS 6.4  --   HGB 13.6 13.7  HCT 42.7 43.3  MCV 91.4 90.8  PLT 239 269   Basic Metabolic Panel:  Recent Labs  Lab 04/08/20 0744 04/08/20 1659 04/09/20 0351 04/11/20 0435 04/13/20 0335  NA 141  --    < > 142 143  K 3.8  --    < > 3.9 3.5  CL 107  --    < > 109 107  CO2 23  --    < > 23 25  GLUCOSE 255*  --    < > 233* 203*  BUN 9  --    < > 29* 28*  CREATININE 1.14  --    < > 1.26* 1.15  CALCIUM 8.8*  --    < > 9.4 9.7  MG 1.8 1.9  --   --   --   PHOS 2.7 2.8  --   --   --    < > = values in this interval not displayed.    IMAGING past 24 hours No results found.   PHYSICAL EXAM    Temp:  [97.8 F (36.6 C)-98.6 F (37 C)] 97.8 F (36.6 C) (07/20 0735) Pulse Rate:  [67-80] 67 (07/20 0735) Resp:  [11-19] 11 (07/20 0735) BP: (149-179)/(69-97) 179/97 (07/20 0735) SpO2:  [97 %-100 %] 99 % (07/20 0735)  General - Well nourished, well developed middle-aged Caucasian male, awake and interactive Ophthalmologic - fundi not visualized due to noncooperation.  Cardiovascular - Regular rhythm and rate.  Neuro -awake and alert, severe dysarthria, oriented to place and people, not to time, able to follow simple commands. Right gaze preference but  able to barely cross midline, blinking to visual threat on the right, but not to the left. Left facial droop. Tongue midline. Left UE flaccid. Left LE 2-/5 proximal and 0/5 distal. RUE 4/5 and RLE 4/5  Left babinski positive. Sensation, coordination not cooperative and gait not tested.   ASSESSMENT/PLAN [name] is a 69 y.o. male with history of tobacco use presenting with L sided flaccidity, R gaze, L facial droop, periods of apnea in setting of BP 260/140. IV tPA administered 04/05/2020 at 1116. Taken to IR for R M1 occlusion.   Stroke: R MCA infarct s/p tPA and evt w/ complete revascularization of R M1 following angioplasty w/ stent placement, infarct secondary to large vessel disease  Code Stroke CT head No acute abnormality. Numerous chronic lacunes B basal ganglia, R thalamic, white matter. ASPECTS 10.     CTA head R M1 occlusion. Intracranial B ICA moderate stenosis. B  P2 w/ moderate stenoses.  CTA neck proximal L ICA 40-50% stenosis. R ICA bifurcation & proximal w/ mixed plaque. Mild to moderate distal cervical R ICA. L VA origin moderate stenosis.  Cerebral angio R M1 occlusion s/p EVT and R M1 stent placed w/ complete revascularization     CT head 7/12 1715 developing R MCA infarct. Mild hyperdensity R putamen, ? Contrast vs hemorrhage.   MRI  Evolving R large MCA infarct (basal ganglia, insula, temporal, frontal lobes). Minimal petechial hemorrhage.   MRA  interval R M1 stent patent  CT repeat 7/15 stable extensive R MCA infarct w/ regional mass effect and partial effacement R temporal horn, mild petechial hemorrhage. R M1 MCA stent, asymmetric hyperdense R M2.  CT and CTA 04/10/20 - Subacute infarct right MCA territory with decreased cytotoxic edema. No hemorrhage or new infarct. 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 Right MCA stent is patent with good flow to right MCA branches. Moderate stenosis left  supraclinoid internal carotid artery due to calcific stenosis. Left middle cerebral artery widely patent. Anterior cerebral arteries patent bilaterally. Severe stenosis left P2 segment. Mild stenosis right posterior cerebral artery.  2D Echo EF 50-55%  TG 550->261, direct LDL 108.2   HgbA1c 13.5  VTE prophylaxis - lovenox  No antithrombotic prior to admission, treated w/ cangrelor post IR, loaded w/ Brilinta 180 and aspirin post op,  aspirin 81 mg daily and Brilinta (ticagrelor) 90 mg bid. -> brilinta on hold in anticipation of PEG placement. Will need to restart once stable post insertion. Continue aspirin.   Therapy recommendations:  SNF  Disposition:  pending (once PEG placed)  Cerebral edema, resolving  CT 7/15 showed large right MCA infarct with regional mass-effect  No significant midline shift yet  Repeat CTA head 7/17 -> decreasing edema  Hypertensive Emergency  BP as high as 260/140 on admission   Home meds:  None  Treated w max Cleviprex post IR for Post IR tight BP control -> now off   SBP goal < 180/105  on lisinopril 20  On hydralazine PRN  BP stable . Long-term BP goal normotensive  Hyperlipidemia  Home meds:  None  Now on lipitor 80  TG 550->261, direct LDL 108.2, goal < 70  Continue statin at discharge  Diabetes type II Uncontrolled w/ Hyperglycemia  Home meds:  none  HgbA1c 13.5, goal < 7.0  CBGs  SSI 0-9 q4h  DB RN following  On Levemir 12u->14u bid  On Novolog 4u q4h TF coverage   Dysphagia . Secondary to stroke . NPO . Speech on board . On cortrak and TF @ 5 and IVF . Sister agreeable to PEG for placement.  . Trauma consulted.  Kary Kos on hold for possible PEG . MBSS today at 1330   AKI   Cre 1.28-1.26-1.15  On free water 200 q4h  Continue TF @ 70  BMP monitoring   Tobacco abuse  Current smoker  Smoking cessation counseling will be provided  Nicotine patch 14 mg daily added  Pt is willing to quit     Alcohol abuse  Heavy user as per sister  alcohol level <10  CIWA protocol   advised to drink no more than 2 drink(s) a day  Close monitor for alcohol withdraw  Cocaine abuse  Pt admitted using cocaine  Cessation education provided  Pt is willing to quit  Avoid beta blocker at this time  Other Stroke Risk Factors  Advanced age  Obesity, Body mass index is 32.26 kg/m., recommend weight loss, diet and exercise as appropriate   Other Active Problems  Hypokalemia - resolved   Hypomagnesemia - resolved  CXR w/ Streaky bibasilar atx. RN instructed to get OOB.  Hospital day # 8   No significant neurological changes. Patient remains with significant left hemiplegia dysarthria and dysphagia and is unlikely to be able to swallow safely anytime soon hence placing a PEG tube will be necessary.  Long discussion with the patient  and answered questions.  Check modified barium swallow by speech therapy later today.  Continue ongoing therapies and medical management.  Hopefully transfer to skilled nursing facility after PEG tube by the end of the week.  Greater than 50% time during this 25-minute visit was spent on counseling and coordination of care about his stroke, hemiplegia and dysphagia and answering questions  Antony Contras, MD  To contact Stroke Continuity provider, please refer to [email or web address] After hours, contact General Neurology

## 2020-04-13 NOTE — Progress Notes (Signed)
Modified Barium Swallow Progress Note  Patient Details  Name: Rodney Estrada MRN: [id number] Date of Birth: 16-Apr-1951  Today's Date: 04/13/2020  Modified Barium Swallow completed.  Full report located under Chart Review in the Imaging Section.  Brief recommendations include the following:  Clinical Impression  Pt presents with oropharyngeal dysphagia characterized by labial weakness, impaired bolus cohesion, reduced lingual retraction, reduced pharyngeal constriction, a pharyngeal delay, and reduced anterior laryngeal movement. He demonstrated left-sided anterior spillage of liquids, premature spillage of liquids to the valleculae and pyriform sinuses, vallecular residue, posterior pharyngeal wall residue with more advanced solids, intermittent incomplete epiglottic inversion, penetration (PAS 3,5) and aspiration (PAS 7,8) with a single instance of delayed coughing which was ineffective in expelling the aspirant. A dysphagia 1 (puree) diet with honey thick liquids is recommended at this time. A chin tuck posture was effective in eliminating laryngeal invasion. However, pt was unable to demonstrate this effectively and consistently during this study. Pt chin tuck was partial or pt raised his head prior to completion of the study, aspiration was still observed. Pt's diet may be advanced to include nectar thick liquids when he is able to consistently demonstrate use of this strategy.    Swallow Evaluation Recommendations       SLP Diet Recommendations: Dysphagia 1 (Puree) solids;Honey thick liquids   Liquid Administration via: Cup;No straw   Medication Administration: Whole meds with puree   Supervision: Full supervision/cueing for compensatory strategies;Staff to assist with self feeding   Compensations: Minimize environmental distractions;Small sips/bites;Follow solids with liquid;Multiple dry swallows after each bite/sip   Postural Changes: Seated upright at 90 degrees   Oral Care  Recommendations: Oral care BID   Other Recommendations: Order thickener from [place] I. Hardin Negus, [place], [place] Office number [phone] Pager [phone]  Horton Marshall 04/13/2020,2:54 PM

## 2020-04-14 ENCOUNTER — Inpatient Hospital Stay (HOSPITAL_COMMUNITY): Payer: 59 | Admitting: Registered Nurse

## 2020-04-14 LAB — GLUCOSE, CAPILLARY
Glucose-Capillary: 216 mg/dL — ABNORMAL HIGH (ref 70–99)
Glucose-Capillary: 236 mg/dL — ABNORMAL HIGH (ref 70–99)
Glucose-Capillary: 193 mg/dL — ABNORMAL HIGH (ref 70–99)
Glucose-Capillary: 131 mg/dL — ABNORMAL HIGH (ref 70–99)
Glucose-Capillary: 198 mg/dL — ABNORMAL HIGH (ref 70–99)

## 2020-04-14 MED ORDER — ASPIRIN 81 MG PO CHEW
81.0000 mg | CHEWABLE_TABLET | Freq: Every day | ORAL | Status: DC
  Administered 2020-04-15 – 2020-04-19 (×5): 81 mg via ORAL
  Filled 2020-04-14 (×5): qty 1

## 2020-04-14 MED ORDER — INSULIN DETEMIR 100 UNIT/ML ~~LOC~~ SOLN
8.0000 [IU] | Freq: Two times a day (BID) | SUBCUTANEOUS | Status: DC
  Administered 2020-04-14 – 2020-04-19 (×10): 8 [IU] via SUBCUTANEOUS
  Filled 2020-04-14 (×11): qty 0.08

## 2020-04-14 MED ORDER — FOLIC ACID 1 MG PO TABS
1.0000 mg | ORAL_TABLET | Freq: Every day | ORAL | Status: DC
  Administered 2020-04-16 – 2020-04-19 (×4): 1 mg via ORAL
  Filled 2020-04-14 (×5): qty 1

## 2020-04-14 MED ORDER — ADULT MULTIVITAMIN W/MINERALS CH
1.0000 | ORAL_TABLET | Freq: Every day | ORAL | Status: DC
  Administered 2020-04-16 – 2020-04-19 (×4): 1 via ORAL
  Filled 2020-04-14 (×5): qty 1

## 2020-04-14 MED ORDER — PANTOPRAZOLE SODIUM 40 MG PO PACK
40.0000 mg | PACK | Freq: Every day | ORAL | Status: DC
  Administered 2020-04-15 – 2020-04-19 (×5): 40 mg via ORAL
  Filled 2020-04-14 (×5): qty 20

## 2020-04-14 MED ORDER — ATORVASTATIN CALCIUM 80 MG PO TABS
80.0000 mg | ORAL_TABLET | Freq: Every day | ORAL | Status: DC
  Administered 2020-04-15 – 2020-04-19 (×5): 80 mg via ORAL
  Filled 2020-04-14 (×5): qty 1

## 2020-04-14 MED ORDER — SENNOSIDES-DOCUSATE SODIUM 8.6-50 MG PO TABS: 1.0000 | ORAL_TABLET | Freq: Every evening | ORAL | Status: DC | PRN

## 2020-04-14 NOTE — Progress Notes (Signed)
Nutrition Follow-up  DOCUMENTATION CODES:   Obesity unspecified  INTERVENTION:   -Magic cup TID with meals, each supplement provides 290 kcal and 9 grams of protein -MVI with minerals daily -Initiate 48 hour calorie count  NUTRITION DIAGNOSIS:   Inadequate oral intake related to inability to eat as evidenced by NPO status.  Ongoing  GOAL:   Patient will meet greater than or equal to 90% of their needs  Progressing  MONITOR:   PO intake, Supplement acceptance, Diet advancement, Labs, Weight trends, TF tolerance, Skin, I & O's  REASON FOR ASSESSMENT:   Consult Enteral/tube feeding initiation and management  ASSESSMENT:   Pt with PMH of uncontrolled DM, tobacco abuse, ETOH/drug abuse admitted with R MCA infarct s/p tPA and IR for revascularization of R M1 with stent.  7/14 cortrak placed; tip postpyloric  7/20- s/p MBSS- advanced to dysphagia 1 diet with honey thick liquids  Reviewed I/O's: -400 ml x 24 hours and -3.3 L since admission  Spoke with pt at bedside, who was eating breakfast at time of visit. He reports his swallow is improving and is meal is "ok". He reports he was really hungry last night because he hadn't eaten in a while. Observed breakfast tray- pt consumed only a juice off of the meal tray.   Pt is anxious to remove feeding tube. Discussed importance of good meal completion and demonstrating adequate oral intake prior to feeding tube removal. Encouraged pt to consume food at meals.   ADDENDUM: Noted cortrak tube and TF orders d/c.   Medications reviewed and include thiamine.   Labs reviewed: CBGS: 200-261 (inpatient orders for glycemic control are 0-15 units insulin aspart every 4 hours and 8 units insulin detemir BID).   Diet Order:   Diet Order            DIET - DYS 1 Room service appropriate? Yes with Assist; Fluid consistency: Honey Thick  Diet effective now                 EDUCATION NEEDS:   Education needs have been  addressed  Skin:  Skin Assessment: Skin Integrity Issues: Skin Integrity Issues:: Incisions Incisions: closed rt groin  Last BM:  04/14/20  Height:   Ht Readings from Last 1 Encounters:  04/05/20 5\' 9"  (1.753 m)    Weight:   Wt Readings from Last 1 Encounters:  04/11/20 99.1 kg    Ideal Body Weight:  72.7 kg  BMI:  Body mass index is 32.26 kg/m.  Estimated Nutritional Needs:   Kcal:  2100-2300  Protein:  115-130 grams  Fluid:  2L/day    Loistine Chance, RD, LDN, [place] Registered Dietitian II Certified Diabetes Care and Education Specialist Please refer to [facility] for RD and/or RD on-call/weekend/after hours pager

## 2020-04-14 NOTE — Progress Notes (Signed)
STROKE TEAM PROGRESS NOTE   INTERVAL HISTORY Patient is sitting up in bed.  He had modified barium swallow yesterday and was cleared for dysphagia 1 pure thick liquid diet.  He is eating this morning but there is lot of spillage of food around his mouth and on his clothes.  Vital signs are stable.  His dysarthria appears improving but he still has dense left hemiplegia.   Vitals:   04/14/20 0417 04/14/20 0755 04/14/20 1111 04/14/20 1154  BP: (!) 176/89 (!) 195/97 (!) 161/75 (!) 155/81  Pulse: 72 76  80  Resp: 15 13  20   Temp: 98.3 F (36.8 C) 98.1 F (36.7 C)    TempSrc: Axillary Oral    SpO2: 99% 96%  96%  Weight:      Height:       CBC:  Recent Labs  Lab 04/11/20 0435 04/13/20 0335  WBC 9.7 8.8  NEUTROABS 6.4  --   HGB 13.6 13.7  HCT 42.7 43.3  MCV 91.4 90.8  PLT 239 001   Basic Metabolic Panel:  Recent Labs  Lab 04/08/20 0744 04/08/20 1659 04/09/20 0351 04/11/20 0435 04/13/20 0335  NA 141  --    < > 142 143  K 3.8  --    < > 3.9 3.5  CL 107  --    < > 109 107  CO2 23  --    < > 23 25  GLUCOSE 255*  --    < > 233* 203*  BUN 9  --    < > 29* 28*  CREATININE 1.14  --    < > 1.26* 1.15  CALCIUM 8.8*  --    < > 9.4 9.7  MG 1.8 1.9  --   --   --   PHOS 2.7 2.8  --   --   --    < > = values in this interval not displayed.    IMAGING past 24 hours No results found.   PHYSICAL EXAM      General - Well nourished, well developed middle-aged Caucasian male, awake and interactive Ophthalmologic - fundi not visualized due to noncooperation.  Cardiovascular - Regular rhythm and rate.  Neuro -awake and alert, mild dysarthria, oriented to place and people, not to time, able to follow simple commands. Right gaze preference but able to barely cross midline, blinking to visual threat on the right, but not to the left. Left facial droop. Tongue midline. Left UE flaccid. Left LE 2-/5 proximal and 0/5 distal. RUE 4/5 and RLE 4/5  Left babinski positive. Sensation,  coordination not cooperative and gait not tested.   ASSESSMENT/PLAN Rodney Estrada is a 69 y.o. male with history of tobacco use presenting with L sided flaccidity, R gaze, L facial droop, periods of apnea in setting of BP 260/140. IV tPA administered 04/05/2020 at 1116. Taken to IR for R M1 occlusion.   Stroke: R MCA infarct s/p tPA and evt w/ complete revascularization of R M1 following angioplasty w/ stent placement, infarct secondary to large vessel disease  Code Stroke CT head No acute abnormality. Numerous chronic lacunes B basal ganglia, R thalamic, white matter. ASPECTS 10.     CTA head R M1 occlusion. Intracranial B ICA moderate stenosis. B P2 w/ moderate stenoses.  CTA neck proximal L ICA 40-50% stenosis. R ICA bifurcation & proximal w/ mixed plaque. Mild to moderate distal cervical R ICA. L VA origin moderate stenosis.  Cerebral angio R M1 occlusion s/p EVT  and R M1 stent placed w/ complete revascularization     CT head 7/12 1715 developing R MCA infarct. Mild hyperdensity R putamen, ? Contrast vs hemorrhage.   MRI  Evolving R large MCA infarct (basal ganglia, insula, temporal, frontal lobes). Minimal petechial hemorrhage.   MRA  interval R M1 stent patent  CT repeat 7/15 stable extensive R MCA infarct w/ regional mass effect and partial effacement R temporal horn, mild petechial hemorrhage. R M1 MCA stent, asymmetric hyperdense R M2.  CT and CTA 04/10/20 - Subacute infarct right MCA territory with decreased cytotoxic edema. No hemorrhage or new infarct. 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 Right MCA stent is patent with good flow to right MCA branches. Moderate stenosis left supraclinoid internal carotid artery due to calcific stenosis. Left middle cerebral artery widely patent. Anterior cerebral arteries patent bilaterally. Severe stenosis left P2 segment. Mild stenosis right posterior cerebral artery.  2D Echo  EF 50-55%  TG 550->261, direct LDL 108.2   HgbA1c 13.5  VTE prophylaxis - lovenox  No antithrombotic prior to admission, treated w/ cangrelor post IR, loaded w/ Brilinta 180 and aspirin post op,  aspirin 81 mg daily and Brilinta (ticagrelor) 90 mg bid. -> brilinta on hold in anticipation of PEG placement. Will need to restart once stable post insertion. Continue aspirin.   Therapy recommendations:  SNF  Disposition:  pending (once PEG placed or intake adequate)  Cerebral edema, resolving  CT 7/15 showed large right MCA infarct with regional mass-effect  No significant midline shift yet  Repeat CTA head 7/17 -> decreasing edema  Hypertensive Emergency  BP as high as 260/140 on admission   Home meds:  None  Treated w max Cleviprex post IR for Post IR tight BP control -> now off   SBP goal < 180/105  on lisinopril 20  BP stable . Long-term BP goal normotensive  Hyperlipidemia  Home meds:  None  Now on lipitor 80  TG 550->261, direct LDL 108.2, goal < 70  Continue statin at discharge  Diabetes type II Uncontrolled w/ Hyperglycemia  Home meds:  none  HgbA1c 13.5, goal < 7.0  CBGs  SSI 0-9 q4h  DB RN following  On Levemir 12u->14u bid - will decrease given TF stopped and po intake only  On Novolog 4u q4h TF coverage ->d/c when TF stopped  Dysphagia . Secondary to stroke . Speech on board . On cortrak and TF @ 70 and IVF -> d/c . Sister agreeable to PEG for placement.  . Trauma consulted.  Kary Kos on hold for possible PEG . Passed MBSS today for D1 honey thick liquids . Pt agreeable to eat . Stop TF  . Calorie count . If intake adequate by tomorrow, will hold Peg, otherwise place. Dr. Leonie Man discussed w/ pt, sister. I spoke w/ SW.    AKI   Cre 1.28-1.26-1.15  BMP monitoring   Tobacco abuse  Current smoker  Smoking cessation counseling will be provided  Nicotine patch 14 mg daily added  Pt is willing to quit    Alcohol  abuse  Heavy user as per sister  alcohol level <10  CIWA protocol   advised to drink no more than 2 drink(s) a day  Close monitor for alcohol withdraw  Cocaine abuse  Pt admitted using cocaine  Cessation education provided  Pt is willing to quit  Avoid beta blocker at this time  Other Stroke Risk Factors  Advanced age  Obesity,  Body mass index is 32.26 kg/m., recommend weight loss, diet and exercise as appropriate   Other Active Problems  Hypokalemia - resolved   Hypomagnesemia - resolved  CXR w/ Streaky bibasilar atx. RN instructed to get OOB.  Hospital day # 9   Start dysphagia 1 diet and patient encouraged to eat as much as possible.  If continues to do well and can eat more than 80% of his meals will cancel PEG tube tomorrow.  Discontinue feeding tube today and stop tube feeds.  Discussed with patient and care team.  Greater than 50% time during the 25-minute visit was spent in counseling and coordination of care and discussion about his dysphagia and feeding and answering questions  Antony Contras, MD  To contact Stroke Continuity provider, please refer to [email or web address] After hours, contact General Neurology

## 2020-04-14 NOTE — Progress Notes (Signed)
  Speech Language Pathology Treatment:    [name] MRN: [id number] DOB: Jul 07, 1951 Today's Date: 04/14/2020 Time: 5038-8828 SLP Time Calculation (min) (ACUTE ONLY): 19 min  Assessment / Plan / Recommendation Clinical Impression  Pt was seen for dysphagia treatment with his sister present for part of the session. Pt's nurse reported that the pt has been consuming meals without signs of aspiration. Pt's sister was educated regarding the results of the modified barium swallow study and recommendations. She verbalized understanding as well as agreement. Pt consumed solids and liquids without overt s/sx of aspiration. However, silent aspiration was inconsistently noted during the swallow study on 7/20. Anterior spillage continues to be noted with liquids and pocketing was observed with purees in the left lateral sulcus. Pt was educated regarding the use of liquid washes and lingual sweeps to reduce/eliminate residue. Pt required cueing to consistently use lingual sweeps, but they were effective in reducing residue to a functional level. Pt was educated regarding compensatory strategies to improve speech intelligibility. Pt verbalized understanding and was observed to use overarticulation when clarification was requested. SLP will continue to follow pt.    HPI HPI: Patient is a 69 y/o male who presents with acute CVA s/p cerebral arteriogram with emergent mechanical thrombectomy of right MCA M1 occlusion along with stent assisted angioplasty of right MCA M1 04/05/20. s/p tpA. + cocaine. 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 No PMH.      SLP Plan  Continue with current plan of care       Recommendations  Diet recommendations: Dysphagia 1 (puree);Honey-thick liquid Liquids provided via: Cup;No straw Medication Administration: Whole meds with puree Supervision: Full supervision/cueing  for compensatory strategies;Staff to assist with self feeding Compensations: Minimize environmental distractions;Small sips/bites;Follow solids with liquid;Multiple dry swallows after each bite/sip Postural Changes and/or Swallow Maneuvers: Seated upright 90 degrees                Oral Care Recommendations: Oral care BID Follow up Recommendations: Skilled Nursing facility SLP Visit Diagnosis: Dysphagia, oropharyngeal phase (R13.12) Plan: Continue with current plan of care       Rodney Estrada. Rodney Estrada, [place], [place] Office number [phone] Pager [place] 04/14/2020, 12:19 PM

## 2020-04-14 NOTE — Progress Notes (Signed)
Physical Therapy Treatment [name] MRN: [id number] DOB: 17-Apr-1951 Today's Date: 04/14/2020    History of Present Illness Patient is a 69 y/o male who presents with acute CVA s/p cerebral arteriogram with emergent mechanical thrombectomy of right MCA M1 occlusion along with stent assisted angioplasty of right MCA M1 04/05/20. s/p tpA. + cocaine. 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 No PMH.    PT Comments    Pt requiring mod-max of 2 for all transfers today.  Did demonstrate some quad contraction on the L side today but still remains very weak and with L inattention.  He was able to assist with transfers by holding/reachign with R UE.  Session focuses on LE exercises with facilitation, EOB balance, and functional transfers.  Cont POC.    Follow Up Recommendations  SNF     Equipment Recommendations  Wheelchair (measurements PT);Wheelchair cushion (measurements PT);Hospital bed;Other (comment) (to be further assessed next venue)    Recommendations for Other Services       Precautions / Restrictions Precautions Precautions: Fall    Mobility  Bed Mobility Overal bed mobility: Needs Assistance Bed Mobility: Sit to Supine;Rolling Rolling: Mod assist;+2 for physical assistance         General bed mobility comments: Required assist for L LE and to elevate trunk from bed; cues for reaching, sequencing, and use of bed pads to facilitate  Transfers Overall transfer level: Needs assistance Equipment used: 2 person hand held assist Transfers: Squat Pivot Transfers;Lateral/Scoot Transfers;Sit to/from Stand Sit to Stand: Max assist;+2 physical assistance Stand pivot transfers: Max assist;+2 physical assistance      Lateral/Scoot Transfers: Max assist;+2 physical assistance General transfer comment: Performed 2 x sit to stand with heavy max x 2 assist: required  faciltation under buttock with bed pad, L knee blocked, and facilitation at shoulders for posture.  Ambulation/Gait             General Gait Details: deferred   Stairs             Wheelchair Mobility    Modified Rankin (Stroke Patients Only) Modified Rankin (Stroke Patients Only) Pre-Morbid Rankin Score: No symptoms Modified Rankin: Severe disability     Balance Overall balance assessment: Needs assistance Sitting-balance support: Single extremity supported Sitting balance-Leahy Scale: Zero Sitting balance - Comments: Sat EOB 5 mins prior to transfer attempts.  Pt with leaning L requiring mod-max A for 95% of the time.  Was able to maintain sitting for 10-20 secs x 2 with R UE pulling on rail and max verbal cues.  Worked on weight shifting and wbing into L elbow. Postural control: Posterior lean;Left lateral lean   Standing balance-Leahy Scale: Zero Standing balance comment: Required max A x 2 with knee blocked; performed 2 reps 5-10 seconds                            Cognition Arousal/Alertness: Awake/alert Behavior During Therapy: Flat affect Overall Cognitive Status: Impaired/Different from baseline Area of Impairment: Attention;Problem solving;Memory;Following commands;Safety/judgement;Awareness                     Memory: Decreased short-term memory Following Commands: Follows one step commands consistently;Follows multi-step commands consistently Safety/Judgement: Decreased awareness of safety;Decreased awareness of deficits   Problem Solving: Slow processing;Decreased initiation;Requires verbal cues;Requires tactile cues;Difficulty sequencing General Comments: Continues to demonstrate L neglect and keeps head turned right  but can look L if cued (turned TV on sports and was to pts L to encourage L gaze)      Exercises General Exercises - Lower Extremity Ankle Circles/Pumps: AAROM;20 reps;Supine;Left Quad Sets: AROM;15 reps;Left;Supine  (noted min quad contraction with cues to straighten knee/push down that fatigued easily) Long Arc Quad: AAROM;10 reps;Right;Seated Hip ABduction/ADduction: AAROM;Right;10 reps;Supine    General Comments General comments (skin integrity, edema, etc.): Pt left in chair at end of session.  Did not have maxi-move pad in place but discussed with nurse tech and nurse (and had secretary order correct size) to slide pad behind pt for transfer back to bed.      Pertinent Vitals/Pain Pain Assessment: No/denies pain    Home Living                      Prior Function            PT Goals (current goals can now be found in the care plan section) Acute Rehab PT Goals Patient Stated Goal: agreeable to rehab PT Goal Formulation: With patient Time For Goal Achievement: 04/20/20 Potential to Achieve Goals: Fair Progress towards PT goals: Progressing toward goals    Frequency    Min 3X/week      PT Plan Current plan remains appropriate    Co-evaluation              AM-PAC PT "6 Clicks" Mobility   Outcome Measure  Help needed turning from your back to your side while in a flat bed without using bedrails?: A Lot Help needed moving from lying on your back to sitting on the side of a flat bed without using bedrails?: Total Help needed moving to and from a bed to a chair (including a wheelchair)?: Total Help needed standing up from a chair using your arms (e.g., wheelchair or bedside chair)?: Total Help needed to walk in hospital room?: Total Help needed climbing 3-5 steps with a railing? : Total 6 Click Score: 7    End of Session Equipment Utilized During Treatment: Gait belt Activity Tolerance: Patient tolerated treatment well Patient left: in chair;with call bell/phone within reach;with chair alarm set Nurse Communication: Mobility status;Need for lift equipment PT Visit Diagnosis: Hemiplegia and hemiparesis;Difficulty in walking, not elsewhere classified  (R26.2);Unsteadiness on feet (R26.81) Hemiplegia - Right/Left: Left Hemiplegia - dominant/non-dominant: Non-dominant Hemiplegia - caused by: Cerebral infarction     Time: 1355-1425 PT Time Calculation (min) (ACUTE ONLY): 30 min  Charges:  $Therapeutic Activity: 8-22 mins $Neuromuscular Re-education: 8-22 mins                     Abran Richard, PT Acute Rehab Services Pager [phone] [facility] [place] 04/14/2020, 3:43 PM

## 2020-04-15 ENCOUNTER — Encounter (HOSPITAL_COMMUNITY): Payer: Self-pay | Admitting: Student in an Organized Health Care Education/Training Program

## 2020-04-15 ENCOUNTER — Encounter (HOSPITAL_COMMUNITY)
Admission: EM | Disposition: A | Discharge status: Skilled Nursing Facility | Payer: Self-pay | Source: Home / Self Care | Attending: Neurology

## 2020-04-15 LAB — BASIC METABOLIC PANEL
Anion gap: 9 (ref 5–15)
BUN: 24 mg/dL — ABNORMAL HIGH (ref 8–23)
CO2: 24 mmol/L (ref 22–32)
Calcium: 9.5 mg/dL (ref 8.9–10.3)
Chloride: 107 mmol/L (ref 98–111)
Creatinine, Ser: 1.19 mg/dL (ref 0.61–1.24)
GFR calc Af Amer: >60 mL/min (ref >60)
GFR calc non Af Amer: >60 mL/min (ref >60)
Glucose, Bld: 168 mg/dL — ABNORMAL HIGH (ref 70–99)
Potassium: 3.8 mmol/L (ref 3.5–5.1)
Sodium: 140 mmol/L (ref 135–145)

## 2020-04-15 LAB — SARS CORONAVIRUS 2 (TAT 6-24 HRS): SARS Coronavirus 2: NEGATIVE

## 2020-04-15 LAB — CBC
HCT: 39.7 % (ref 39.0–52.0)
Hemoglobin: 13 g/dL (ref 13.0–17.0)
MCH: 29 pg (ref 26.0–34.0)
MCHC: 32.7 g/dL (ref 30.0–36.0)
MCV: 88.6 fL (ref 80.0–100.0)
Platelets: 314 10*3/uL (ref 150–400)
RBC: 4.48 MIL/uL (ref 4.22–5.81)
RDW: 13.4 % (ref 11.5–15.5)
WBC: 9.7 10*3/uL (ref 4.0–10.5)
nRBC: 0 % (ref 0.0–0.2)

## 2020-04-15 LAB — GLUCOSE, CAPILLARY
Glucose-Capillary: 145 mg/dL — ABNORMAL HIGH (ref 70–99)
Glucose-Capillary: 146 mg/dL — ABNORMAL HIGH (ref 70–99)
Glucose-Capillary: 156 mg/dL — ABNORMAL HIGH (ref 70–99)
Glucose-Capillary: 181 mg/dL — ABNORMAL HIGH (ref 70–99)
Glucose-Capillary: 186 mg/dL — ABNORMAL HIGH (ref 70–99)
Glucose-Capillary: 235 mg/dL — ABNORMAL HIGH (ref 70–99)

## 2020-04-15 SURGERY — CANCELLED PROCEDURE

## 2020-04-15 NOTE — Progress Notes (Signed)
Physical Therapy Treatment [name] MRN: [id number] DOB: 1951/07/29 Today's Date: 04/15/2020    History of Present Illness Patient is a 69 y/o male who presents with acute CVA s/p cerebral arteriogram with emergent mechanical thrombectomy of right MCA M1 occlusion along with stent assisted angioplasty of right MCA M1 04/05/20. s/p tpA. + cocaine. 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 No PMH.    PT Comments    Pt in bed upon arrival of PT/OT, agreeable to session with focus on progression of seated balance and L-sided attention with functional tasks. The pt was able to maintain seated balance with maxA of 1, but could progress to minA or modA when cued for positioning and given visual cues. The pt was also able to participate in multiple reaching tasks with RUE and cued for improved visual scanning and reaching outside his BOS. He does continue to need maxA to recover to upright seated position following the reaching. The pt also continues to demo significant limitations in functional strength, coordination, and sequencing needed to complete transfers such as lateral scooting and sit-stands, and continues to require maxA of 2 to complete. The pt will benefit from skilled PT acutely and following d/c to address these deficits.    Follow Up Recommendations  SNF     Equipment Recommendations  Wheelchair (measurements PT);Wheelchair cushion (measurements PT);Hospital bed    Recommendations for Other Services       Precautions / Restrictions Precautions Precautions: Fall Precaution Comments: L hemiparesis Restrictions Weight Bearing Restrictions: No    Mobility  Bed Mobility Overal bed mobility: Needs Assistance Bed Mobility: Supine to Sit     Supine to sit: Max assist;+2 for physical assistance     General bed mobility comments: Required assist for L LE and to  elevate trunk from bed; cues for reaching, sequencing, and use of bed pads to facilitate  Transfers Overall transfer level: Needs assistance Equipment used: 2 person hand held assist Transfers: Lateral/Scoot Transfers;Sit to/from Stand Sit to Stand: Max assist;+2 physical assistance        Lateral/Scoot Transfers: Max assist;+2 physical assistance General transfer comment: performed x6 lateral scoots from EOB to recliner with maxA of 2 to bed pads to facilitate hip movement with L knee blocked and cues for trunk movement and pushing with LLE. maxA of 2 with assist of 3rd to facilitate upright head positioning and gaze. the pt struggles with maintaining positioning of his RLE underneath him  Ambulation/Gait             General Gait Details: deferred   Stairs             Wheelchair Mobility    Modified Rankin (Stroke Patients Only) Modified Rankin (Stroke Patients Only) Pre-Morbid Rankin Score: No symptoms Modified Rankin: Severe disability     Balance Overall balance assessment: Needs assistance Sitting-balance support: Single extremity supported Sitting balance-Leahy Scale: Zero Sitting balance - Comments: Sat EOB 10 mins prior to transfer attempts.  Pt with leaning L requiring mod-max A for 95% of the time.  was able to maintain with minA when cued and gien visual cues for positioning, but unable to maintain without constant cues/corections Postural control: Posterior lean;Left lateral lean Standing balance support: Bilateral upper extremity supported Standing balance-Leahy Scale: Zero Standing balance comment: Required max A x 2 with knee blocked; performed 2 reps 5-10 seconds  Cognition Arousal/Alertness: Awake/alert Behavior During Therapy: Flat affect Overall Cognitive Status: Impaired/Different from baseline Area of Impairment: Attention;Problem solving;Memory;Following commands;Safety/judgement;Awareness                    Current Attention Level: Focused Memory: Decreased short-term memory Following Commands: Follows one step commands consistently;Follows multi-step commands consistently Safety/Judgement: Decreased awareness of safety;Decreased awareness of deficits Awareness: Intellectual Problem Solving: Slow processing;Decreased initiation;Requires verbal cues;Requires tactile cues;Difficulty sequencing General Comments: Continues to demonstrate L neglect and keeps head turned right but can look L if cued (turned TV on sports and was to pts L to encourage L gaze). Pt able to follow cues for searching for objects that are to his left, unable to maintain balance or positioning when attending another task      Exercises      General Comments        Pertinent Vitals/Pain Pain Assessment: Faces Faces Pain Scale: Hurts a little bit Pain Location: R shoulder and knee, then grimaced with movement of L shoulder Pain Descriptors / Indicators: Discomfort;Grimacing;Sore Pain Intervention(s): Limited activity within patient's tolerance;Monitored during session;Repositioned    Home Living                      Prior Function            PT Goals (current goals can now be found in the care plan section) Acute Rehab PT Goals Patient Stated Goal: agreeable to rehab PT Goal Formulation: With patient Time For Goal Achievement: 04/20/20 Potential to Achieve Goals: Fair Progress towards PT goals: Progressing toward goals    Frequency    Min 3X/week      PT Plan Current plan remains appropriate    Co-evaluation PT/OT/SLP Co-Evaluation/Treatment: Yes Reason for Co-Treatment: Complexity of the patient's impairments (multi-system involvement);Necessary to address cognition/behavior during functional activity;To address functional/ADL transfers PT goals addressed during session: Mobility/safety with mobility;Balance;Strengthening/ROM        AM-PAC PT "6 Clicks" Mobility   Outcome  Measure  Help needed turning from your back to your side while in a flat bed without using bedrails?: A Lot Help needed moving from lying on your back to sitting on the side of a flat bed without using bedrails?: Total Help needed moving to and from a bed to a chair (including a wheelchair)?: Total Help needed standing up from a chair using your arms (e.g., wheelchair or bedside chair)?: Total Help needed to walk in hospital room?: Total Help needed climbing 3-5 steps with a railing? : Total 6 Click Score: 7    End of Session Equipment Utilized During Treatment: Gait belt Activity Tolerance: Patient tolerated treatment well Patient left: in chair;with call bell/phone within reach;with chair alarm set Nurse Communication: Mobility status;Need for lift equipment PT Visit Diagnosis: Hemiplegia and hemiparesis;Difficulty in walking, not elsewhere classified (R26.2);Unsteadiness on feet (R26.81) Hemiplegia - Right/Left: Left Hemiplegia - dominant/non-dominant: Non-dominant Hemiplegia - caused by: Cerebral infarction     Time: 1010-1045 PT Time Calculation (min) (ACUTE ONLY): 35 min  Charges:  $Neuromuscular Re-education: 8-22 mins                     Karma Ganja, PT, DPT   Acute Rehabilitation Department Pager #: [phone]   Otho Bellows 04/15/2020, 11:47 AM

## 2020-04-15 NOTE — Progress Notes (Signed)
Calorie Count Note  48 hour calorie count ordered.  Diet: Dysphagia 1, Honey Thick liquids Supplements: Magic Cup TID  Breakfast: 285 kcals, 12 grams protein Lunch: 232 kcals, 3 grams protein Dinner: 12 kcals, 0 grams protein Supplements: 290 kcals, 9 grams protein  Total intake: 819 kcal (39% of minimum estimated needs)  24 protein (20.9% of minimum estimated needs)  Nutrition Dx: Inadequate oral intake related to poor appetite as evidenced by meal completion <50%  Goal: Patient will meet greater than or equal to 90% of their needs  Intervention: Continue Magic Cup TID  Larkin Ina, MS, RD, LDN RD pager number and weekend/on-call pager number located in [place].

## 2020-04-15 NOTE — Progress Notes (Signed)
STROKE TEAM PROGRESS NOTE   INTERVAL HISTORY Patient has been eating moderately well hence we will cancel PEG tube.  He remains neurologically stable.  No change in exam.  Vital signs stable.  Vitals:   04/15/20 0021 04/15/20 0347 04/15/20 0750 04/15/20 1127  BP: (!) 150/84 (!) 144/90 (!) 167/93 (!) 137/97  Pulse: 80 73 75 77  Resp: 20 20 18 18   Temp: 98.6 F (37 C) 98.4 F (36.9 C) 98.2 F (36.8 C) 98.7 F (37.1 C)  TempSrc: Oral Oral Oral Oral  SpO2: 98% 97% 99% 98%  Weight:      Height:       CBC:  Recent Labs  Lab 04/11/20 0435 04/11/20 0435 04/13/20 0335 04/15/20 0500  WBC 9.7   < > 8.8 9.7  NEUTROABS 6.4  --   --   --   HGB 13.6   < > 13.7 13.0  HCT 42.7   < > 43.3 39.7  MCV 91.4   < > 90.8 88.6  PLT 239   < > 305 314   < > = values in this interval not displayed.   Basic Metabolic Panel:  Recent Labs  Lab 04/08/20 1659 04/09/20 0351 04/13/20 0335 04/15/20 0500  NA  --    < > 143 140  K  --    < > 3.5 3.8  CL  --    < > 107 107  CO2  --    < > 25 24  GLUCOSE  --    < > 203* 168*  BUN  --    < > 28* 24*  CREATININE  --    < > 1.15 1.19  CALCIUM  --    < > 9.7 9.5  MG 1.9  --   --   --   PHOS 2.8  --   --   --    < > = values in this interval not displayed.    IMAGING past 24 hours No results found.   PHYSICAL EXAM        General - Well nourished, well developed middle-aged Caucasian male, awake and interactive Ophthalmologic - fundi not visualized due to noncooperation.  Cardiovascular - Regular rhythm and rate.  Neuro -awake and alert, mild dysarthria, oriented to place and people, not to time, able to follow simple commands. Right gaze preference but able to barely cross midline, blinking to visual threat on the right, but not to the left. Left facial droop. Tongue midline. Left UE flaccid. Left LE 2-/5 proximal and 0/5 distal. RUE 4/5 and RLE 4/5  Left babinski positive. Sensation, coordination not cooperative and gait not  tested.   ASSESSMENT/PLAN [name] is a 69 y.o. male with history of tobacco use presenting with L sided flaccidity, R gaze, L facial droop, periods of apnea in setting of BP 260/140. IV tPA administered 04/05/2020 at 1116. Taken to IR for R M1 occlusion.   Stroke: R MCA infarct s/p tPA and evt w/ complete revascularization of R M1 following angioplasty w/ stent placement, infarct secondary to large vessel disease  Code Stroke CT head No acute abnormality. Numerous chronic lacunes B basal ganglia, R thalamic, white matter. ASPECTS 10.     CTA head R M1 occlusion. Intracranial B ICA moderate stenosis. B P2 w/ moderate stenoses.  CTA neck proximal L ICA 40-50% stenosis. R ICA bifurcation & proximal w/ mixed plaque. Mild to moderate distal cervical R ICA. L VA origin moderate stenosis.  Cerebral  angio R M1 occlusion s/p EVT and R M1 stent placed w/ complete revascularization     CT head 7/12 1715 developing R MCA infarct. Mild hyperdensity R putamen, ? Contrast vs hemorrhage.   MRI  Evolving R large MCA infarct (basal ganglia, insula, temporal, frontal lobes). Minimal petechial hemorrhage.   MRA  interval R M1 stent patent  CT repeat 7/15 stable extensive R MCA infarct w/ regional mass effect and partial effacement R temporal horn, mild petechial hemorrhage. R M1 MCA stent, asymmetric hyperdense R M2.  CT and CTA 04/10/20 - Subacute infarct right MCA territory with decreased cytotoxic edema. No hemorrhage or new infarct. 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 Right MCA stent is patent with good flow to right MCA branches. Moderate stenosis left supraclinoid internal carotid artery due to calcific stenosis. Left middle cerebral artery widely patent. Anterior cerebral arteries patent bilaterally. Severe stenosis left P2 segment. Mild stenosis right posterior cerebral artery.  2D Echo EF 50-55%  TG 550->261, direct LDL 108.2    HgbA1c 13.5  VTE prophylaxis - lovenox  No antithrombotic prior to admission, treated w/ cangrelor post IR, loaded w/ Brilinta 180 and aspirin post op,  aspirin 81 mg daily and Brilinta (ticagrelor) 90 mg bid. -> brilinta on hold in anticipation of PEG placement. Plan restart at d/c once stable post insertion vs at d/c if no PFO.  Continue aspirin.   Therapy recommendations:  SNF  Disposition:  Plan d/c to SNF tomorrow  COVID testing for SNF placement tomorrow  Cerebral edema, resolving  CT 7/15 showed large right MCA infarct with regional mass-effect  No significant midline shift yet  Repeat CTA head 7/17 -> decreasing edema  Hypertensive Emergency  BP as high as 260/140 on admission   Home meds:  None  Treated w max Cleviprex post IR for Post IR tight BP control -> now off   SBP goal < 180/105  on lisinopril 20  BP stable . Long-term BP goal normotensive  Hyperlipidemia  Home meds:  None  Now on lipitor 80  TG 550->261, direct LDL 108.2, goal < 70  Continue statin at discharge  Diabetes type II Uncontrolled w/ Hyperglycemia  Home meds:  none  HgbA1c 13.5, goal < 7.0  CBGs  SSI 0-9 q4h  DB RN following  On Levemir 12u->14u bid - will decrease given TF stopped and po intake only  On Novolog 4u q4h TF coverage ->d/c when TF stopped  Dysphagia . Secondary to stroke . Speech on board . On cortrak and TF @ 70 and IVF -> d/c . Sister agreeable to PEG for placement.  . Trauma consulted.  Kary Kos on hold for possible PEG . Passed MBSS today for D1 honey thick liquids . Pt agreeable to eat . Stop TF  . Calorie count . Taking in ~40%, hold Peg today . Follow calorie count  AKI   Cre 1.28-1.26-1.15-1.19  BMP monitoring   Tobacco abuse  Current smoker  Smoking cessation counseling will be provided  Nicotine patch 14 mg daily added  Pt is willing to quit    Alcohol abuse  Heavy user as per sister  alcohol level <10  CIWA  protocol   advised to drink no more than 2 drink(s) a day  Close monitor for alcohol withdraw  Cocaine abuse  Pt admitted using cocaine  Cessation education provided  Pt is willing to quit  Avoid beta blocker at this time  Other Stroke Risk  Factors  Advanced age  Obesity, Body mass index is 32.26 kg/m., recommend weight loss, diet and exercise as appropriate   Other Active Problems  Hypokalemia - resolved   Hypomagnesemia - resolved  CXR w/ Streaky bibasilar atx. RN instructed to get OOB.  Hospital day # 10  Recommend DC PEG tube plans and encourage oral intake.  Transfer to skilled nursing facility in the next couple of days when bed available.  Discussed with trauma team.  Antony Contras, MD  To contact Stroke Continuity provider, please refer to [email or web address] After hours, contact General Neurology

## 2020-04-15 NOTE — Progress Notes (Signed)
Noted PEG was cancelled by Primary team today. Patient has passed for DYS1 diet. Trauma will sign off.

## 2020-04-15 NOTE — Progress Notes (Signed)
Occupational Therapy Treatment Session   Clinical Impression: Upon arrival pt laying in bed and agreeable to skilled-OT session. Pt with good recall of therapist's name from [facility] session. Still presents with decreased awareness of L side and safety awareness. Therapist applying pt's socks with Total A at bed level. Pt needing Max A +2 for bed mobility with supine to sit and Min-Max A with sitting balance at EOB with max multimodal cues to correct posture. Pt participated in tabletop scanning and reaching activity while sitting EOB. Pt able to find 1/4 objects on R side with min verbal cues and 3/4 objects at midline and L side with max multimodal cues for head turning. Pt needing Max A +2 for lateral scoots to recliner and sit<>stand x2 with multimodal cues for sequencing and hand placement. Believe dc plan remains appropriate for patient. Will follow acutely.       04/15/20 1100  OT Visit Information  Last OT Received On 04/15/20  Assistance Needed +2  Reason for Co-Treatment Complexity of the patient's impairments (multi-system involvement);Necessary to address cognition/behavior during functional activity;To address functional/ADL transfers;For patient/therapist safety  OT goals addressed during session Strengthening/ROM;ADL's and self-care  History of Present Illness Patient is a 69 y/o male who presents with acute CVA s/p cerebral arteriogram with emergent mechanical thrombectomy of right MCA M1 occlusion along with stent assisted angioplasty of right MCA M1 04/05/20. s/p tpA. + cocaine. 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 No PMH.  Precautions  Precautions Fall  Precaution Comments NG tube; SBP <180 and L hemiparesis  Pain Assessment  Pain Assessment Faces  Faces Pain Scale 2  Pain Location R shoulder and knee, then grimaced with movement of L shoulder  Pain Descriptors / Indicators  Discomfort;Grimacing  Pain Intervention(s) Limited activity within patient's tolerance;Monitored during session;Repositioned  Cognition  Arousal/Alertness Awake/alert  Behavior During Therapy Flat affect  Overall Cognitive Status Impaired/Different from baseline  Area of Impairment Attention;Problem solving;Memory;Following commands;Safety/judgement;Awareness  Current Attention Level Focused  Memory Decreased short-term memory  Following Commands Follows one step commands consistently;Follows multi-step commands consistently  Safety/Judgement Decreased awareness of safety;Decreased awareness of deficits  Awareness Intellectual  Problem Solving Slow processing;Decreased initiation;Requires verbal cues;Requires tactile cues;Difficulty sequencing  General Comments Continues to demonstrate L neglect and keeps head turned right. Needs multimodal cues to look to L side. Pt unaware of deficits and safety with lack of awareness of flaccid L side and how it affects his ability to mobilize independently.  Upper Extremity Assessment  Upper Extremity Assessment LUE deficits/detail  LUE Deficits / Details mostly flaccid L UE noted some tone in triceps  LUE Coordination decreased gross motor;decreased fine motor  Lower Extremity Assessment  Lower Extremity Assessment Defer to PT evaluation  ADL  Grooming Total assistance;Wash/dry face;Sitting  Grooming Details (indicate cue type and reason) Total A with wiping face  Lower Body Dressing Total assistance;Bed level  Lower Body Dressing Details (indicate cue type and reason) Total A with LB dressing  Toilet Transfer Maximal assistance;+2 for safety/equipment;+2 for physical assistance;Requires drop arm;Cueing for sequencing  Toilet Transfer Details (indicate cue type and reason) simulated to recliner with lateral scoots and multimodal cueing for sequencing  General ADL Comments Max A - Total A +2 due to weakness and incoordination on L side  Bed Mobility   Overal bed mobility Needs Assistance  Bed Mobility Sit to Supine;Rolling  Supine to sit Max assist;+2 for physical assistance  General bed mobility comments  Required assist for L LE and to elevate trunk from bed; cues for reaching, sequencing, and use of bed pads to facilitate  Balance  Overall balance assessment Needs assistance  Sitting-balance support Single extremity supported  Sitting balance-Leahy Scale Zero  Sitting balance - Comments Pt sat EOB with Max-min A with Max verbal cues for vision and posture to sit upright in midline with tendancy to lean left and posteriorly   Postural control Posterior lean;Left lateral lean  Standing balance support Bilateral upper extremity supported  Standing balance-Leahy Scale Zero  Standing balance comment required Max A+2 with L knee and R foot blocked  Vision- Assessment  Vision Assessment? Vision impaired- to be further tested in functional context  Additional Comments pt still needing max multimodal cues to turn and look to left side. cannot break midline with eye gaze to R  Transfers  Overall transfer level Needs assistance  Equipment used 2 person hand held assist  Transfers Sit to/from Stand;Lateral/Scoot Transfers  Sit to Stand Max assist;+2 physical assistance   Lateral/Scoot Transfers Max assist;+2 physical assistance  General transfer comment Performed x2 sit to stand from recliner with max multimodal cues for hand placement and sequencing. Needing to block L knee and R foot due to R foot and knee wanting to abduct out to R. Lateral scoots x6 from bed to recliner with multimodal cues for sequencing   General Comments  General comments (skin integrity, edema, etc.) Pt finding 1/4 objects on table top with min verbal cues and 3/4 with max multimodal cues - able to turn head and look to left to pick up and hand objects to therapist on L side  OT - End of Session  Equipment Utilized During Treatment Gait belt  Activity Tolerance Patient  limited by fatigue  Patient left in chair;with call bell/phone within reach;with chair alarm set  Nurse Communication Mobility status  OT Assessment/Plan  OT Plan Discharge plan remains appropriate  OT Visit Diagnosis Unsteadiness on feet (R26.81);Muscle weakness (generalized) (M62.81)  OT Frequency (ACUTE ONLY) Min 2X/week  Follow Up Recommendations SNF  OT Equipment Wheelchair (measurements OT);Wheelchair cushion (measurements OT);Hospital bed  AM-PAC OT "6 Clicks" Daily Activity Outcome Measure (Version 2)  Help from another person eating meals? 2  Help from another person taking care of personal grooming? 2  Help from another person toileting, which includes using toliet, bedpan, or urinal? 1  Help from another person bathing (including washing, rinsing, drying)? 1  Help from another person to put on and taking off regular upper body clothing? 2  Help from another person to put on and taking off regular lower body clothing? 1  6 Click Score 9  OT Goal Progression  Progress towards OT goals Progressing toward goals  OT Time Calculation  OT Start Time (ACUTE ONLY) 1010  OT Stop Time (ACUTE ONLY) 1045  OT Time Calculation (min) 35 min  OT General Charges  $OT Visit 1 Visit  OT Treatments  $Therapeutic Activity 8-22 mins   Signature:  Prince Olivier/OTS

## 2020-04-16 DIAGNOSIS — F17200 Nicotine dependence, unspecified, uncomplicated: Secondary | ICD-10-CM | POA: Diagnosis present

## 2020-04-16 DIAGNOSIS — E119 Type 2 diabetes mellitus without complications: Secondary | ICD-10-CM | POA: Diagnosis present

## 2020-04-16 DIAGNOSIS — I10 Essential (primary) hypertension: Secondary | ICD-10-CM | POA: Diagnosis present

## 2020-04-16 DIAGNOSIS — E785 Hyperlipidemia, unspecified: Secondary | ICD-10-CM | POA: Diagnosis present

## 2020-04-16 DIAGNOSIS — N179 Acute kidney failure, unspecified: Secondary | ICD-10-CM | POA: Diagnosis present

## 2020-04-16 DIAGNOSIS — F1410 Cocaine abuse, uncomplicated: Secondary | ICD-10-CM | POA: Diagnosis present

## 2020-04-16 DIAGNOSIS — I69391 Dysphagia following cerebral infarction: Secondary | ICD-10-CM

## 2020-04-16 DIAGNOSIS — IMO0002 Reserved for concepts with insufficient information to code with codable children: Secondary | ICD-10-CM | POA: Diagnosis present

## 2020-04-16 DIAGNOSIS — E1165 Type 2 diabetes mellitus with hyperglycemia: Secondary | ICD-10-CM | POA: Diagnosis present

## 2020-04-16 DIAGNOSIS — E669 Obesity, unspecified: Secondary | ICD-10-CM | POA: Diagnosis present

## 2020-04-16 DIAGNOSIS — I161 Hypertensive emergency: Secondary | ICD-10-CM | POA: Diagnosis present

## 2020-04-16 DIAGNOSIS — G936 Cerebral edema: Secondary | ICD-10-CM | POA: Diagnosis present

## 2020-04-16 DIAGNOSIS — F1010 Alcohol abuse, uncomplicated: Secondary | ICD-10-CM | POA: Diagnosis present

## 2020-04-16 LAB — BASIC METABOLIC PANEL
Anion gap: 13 (ref 5–15)
BUN: 27 mg/dL — ABNORMAL HIGH (ref 8–23)
CO2: 24 mmol/L (ref 22–32)
Calcium: 10 mg/dL (ref 8.9–10.3)
Chloride: 105 mmol/L (ref 98–111)
Creatinine, Ser: 1.24 mg/dL (ref 0.61–1.24)
GFR calc Af Amer: >60 mL/min (ref >60)
GFR calc non Af Amer: 60 mL/min — ABNORMAL LOW (ref >60)
Glucose, Bld: 214 mg/dL — ABNORMAL HIGH (ref 70–99)
Potassium: 3.8 mmol/L (ref 3.5–5.1)
Sodium: 142 mmol/L (ref 135–145)

## 2020-04-16 LAB — GLUCOSE, CAPILLARY
Glucose-Capillary: 118 mg/dL — ABNORMAL HIGH (ref 70–99)
Glucose-Capillary: 118 mg/dL — ABNORMAL HIGH (ref 70–99)
Glucose-Capillary: 121 mg/dL — ABNORMAL HIGH (ref 70–99)
Glucose-Capillary: 131 mg/dL — ABNORMAL HIGH (ref 70–99)
Glucose-Capillary: 131 mg/dL — ABNORMAL HIGH (ref 70–99)
Glucose-Capillary: 160 mg/dL — ABNORMAL HIGH (ref 70–99)
Glucose-Capillary: 210 mg/dL — ABNORMAL HIGH (ref 70–99)

## 2020-04-16 MED ORDER — NICOTINE 14 MG/24HR TD PT24: 14.0000 mg | MEDICATED_PATCH | Freq: Every day | TRANSDERMAL | 0 refills | Status: DC

## 2020-04-16 MED ORDER — TICAGRELOR 90 MG PO TABS: 90.0000 mg | ORAL_TABLET | Freq: Two times a day (BID) | ORAL | Status: DC

## 2020-04-16 MED ORDER — DIPHENHYDRAMINE HCL 50 MG/ML IJ SOLN
25.0000 mg | Freq: Once | INTRAMUSCULAR | Status: AC
  Administered 2020-04-16: 25 mg via INTRAVENOUS
  Filled 2020-04-16: qty 1

## 2020-04-16 MED ORDER — TICAGRELOR 90 MG PO TABS
90.0000 mg | ORAL_TABLET | Freq: Two times a day (BID) | ORAL | Status: AC
  Administered 2020-04-16: 90 mg via ORAL
  Filled 2020-04-16: qty 1

## 2020-04-16 MED ORDER — DIPHENHYDRAMINE HCL 25 MG PO CAPS: 25.0000 mg | ORAL_CAPSULE | Freq: Four times a day (QID) | ORAL | 0 refills | Status: DC | PRN

## 2020-04-16 MED ORDER — ASPIRIN 81 MG PO CHEW: 81.0000 mg | CHEWABLE_TABLET | Freq: Every day | ORAL | Status: DC

## 2020-04-16 MED ORDER — LISINOPRIL 20 MG PO TABS: 20.0000 mg | ORAL_TABLET | Freq: Every day | ORAL | Status: DC

## 2020-04-16 MED ORDER — DIPHENHYDRAMINE HCL 25 MG PO CAPS
25.0000 mg | ORAL_CAPSULE | Freq: Four times a day (QID) | ORAL | Status: DC | PRN
  Administered 2020-04-16 – 2020-04-19 (×8): 25 mg via ORAL
  Filled 2020-04-16 (×8): qty 1

## 2020-04-16 MED ORDER — INSULIN ASPART 100 UNIT/ML ~~LOC~~ SOLN: 0.0000 [IU] | SUBCUTANEOUS | 11 refills | Status: DC

## 2020-04-16 MED ORDER — ATORVASTATIN CALCIUM 80 MG PO TABS: 80.0000 mg | ORAL_TABLET | Freq: Every day | ORAL | Status: DC

## 2020-04-16 MED ORDER — INSULIN DETEMIR 100 UNIT/ML ~~LOC~~ SOLN: 8.0000 [IU] | Freq: Two times a day (BID) | SUBCUTANEOUS | 11 refills | Status: DC

## 2020-04-16 MED ORDER — RESOURCE THICKENUP CLEAR PO POWD: 1.0000 | ORAL | Status: DC | PRN

## 2020-04-16 MED ORDER — SODIUM CHLORIDE 0.9 % IV BOLUS
500.0000 mL | Freq: Once | INTRAVENOUS | Status: AC
  Administered 2020-04-16: 500 mL via INTRAVENOUS

## 2020-04-16 NOTE — Progress Notes (Signed)
Calorie Count Note  48 hour calorie count ordered.  Diet: Dysphagia 1, Honey Thick liquids Supplements: Magic Cup TID  RD went up to pt's room to collect calorie count information from yesterday (Day 2). There were no meal tickets in the envelope. There is also no documentation in the chart regarding meal/supplement completion. For this reason, unable to provide results for day 2 of the calorie count.   If calorie count is to be continued, RD will follow-up with results on Monday.   Nutrition Dx: Inadequate oral intakerelated to poor appetite as evidenced by meal completion <50%  Goal: Patient will meet greater than or equal to 90% of their needs  Intervention: Continue Magic Cup TID  Larkin Ina, MS, RD, LDN RD pager number and weekend/on-call pager number located in [place].

## 2020-04-16 NOTE — Progress Notes (Signed)
STROKE TEAM PROGRESS NOTE   INTERVAL HISTORY Patient has been eating moderately well hence  will plan to discharge to skilled nursing facility today.  He remains neurologically stable.  No change in exam.  Vital signs stable.  Vitals:   04/16/20 0401 04/16/20 0806 04/16/20 1137 04/16/20 1555  BP: (!) 169/107 (!) 157/91 (!) 113/87 (!) 166/89  Pulse: 70 76 87 78  Resp: 16 (!) 9 18 18   Temp: (!) 97.5 F (36.4 C) 97.9 F (36.6 C) 98.5 F (36.9 C) 98.8 F (37.1 C)  TempSrc: Axillary  Oral Oral  SpO2: 99% 98% 94% 100%  Weight:      Height:       CBC:  Recent Labs  Lab 04/11/20 0435 04/11/20 0435 04/13/20 0335 04/15/20 0500  WBC 9.7   < > 8.8 9.7  NEUTROABS 6.4  --   --   --   HGB 13.6   < > 13.7 13.0  HCT 42.7   < > 43.3 39.7  MCV 91.4   < > 90.8 88.6  PLT 239   < > 305 314   < > = values in this interval not displayed.   Basic Metabolic Panel:  Recent Labs  Lab 04/15/20 0500 04/16/20 1232  NA 140 142  K 3.8 3.8  CL 107 105  CO2 24 24  GLUCOSE 168* 214*  BUN 24* 27*  CREATININE 1.19 1.24  CALCIUM 9.5 10.0    IMAGING past 24 hours No results found.   PHYSICAL EXAM        General - Well nourished, well developed middle-aged Caucasian male, awake and interactive Ophthalmologic - fundi not visualized due to noncooperation.  Cardiovascular - Regular rhythm and rate.  Neuro -awake and alert, mild dysarthria, oriented to place and people, not to time, able to follow simple commands. Right gaze preference but able to barely cross midline, blinking to visual threat on the right, but not to the left. Left facial droop. Tongue midline. Left UE flaccid. Left LE 2-/5 proximal and 0/5 distal. RUE 4/5 and RLE 4/5  Left babinski positive. Sensation, coordination not cooperative and gait not tested.   ASSESSMENT/PLAN Mr. Numan Zylstra is a 69 y.o. male with history of tobacco use presenting with L sided flaccidity, R gaze, L facial droop, periods of apnea in setting of BP  260/140. IV tPA administered 04/05/2020 at 1116. Taken to IR for R M1 occlusion.   Stroke: R MCA infarct s/p tPA and evt w/ complete revascularization of R M1 following angioplasty w/ stent placement, infarct secondary to large vessel disease  Code Stroke CT head No acute abnormality. Numerous chronic lacunes B basal ganglia, R thalamic, white matter. ASPECTS 10.     CTA head R M1 occlusion. Intracranial B ICA moderate stenosis. B P2 w/ moderate stenoses.  CTA neck proximal L ICA 40-50% stenosis. R ICA bifurcation & proximal w/ mixed plaque. Mild to moderate distal cervical R ICA. L VA origin moderate stenosis.  Cerebral angio R M1 occlusion s/p EVT and R M1 stent placed w/ complete revascularization     CT head 7/12 1715 developing R MCA infarct. Mild hyperdensity R putamen, ? Contrast vs hemorrhage.   MRI  Evolving R large MCA infarct (basal ganglia, insula, temporal, frontal lobes). Minimal petechial hemorrhage.   MRA  interval R M1 stent patent  CT repeat 7/15 stable extensive R MCA infarct w/ regional mass effect and partial effacement R temporal horn, mild petechial hemorrhage. R M1 MCA stent, asymmetric  hyperdense R M2.  CT and CTA 04/10/20 - Subacute infarct right MCA territory with decreased cytotoxic edema. No hemorrhage or new infarct. 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 Right MCA stent is patent with good flow to right MCA branches. Moderate stenosis left supraclinoid internal carotid artery due to calcific stenosis. Left middle cerebral artery widely patent. Anterior cerebral arteries patent bilaterally. Severe stenosis left P2 segment. Mild stenosis right posterior cerebral artery.  2D Echo EF 50-55%  TG 550->261, direct LDL 108.2   HgbA1c 13.5  VTE prophylaxis - lovenox  No antithrombotic prior to admission, treated w/ cangrelor post IR, loaded w/ Brilinta 180 and aspirin post op,  aspirin 81 mg daily and  Brilinta (ticagrelor) 90 mg bid. -> brilinta on hold in anticipation of PEG placement. Plan restart at d/c once stable post insertion vs at d/c if no PFO.  Continue aspirin.   Therapy recommendations:  SNF  Disposition:  Plan d/c to SNF tomorrow  COVID testing for SNF placement tomorrow  Cerebral edema, resolving  CT 7/15 showed large right MCA infarct with regional mass-effect  No significant midline shift yet  Repeat CTA head 7/17 -> decreasing edema  Hypertensive Emergency  BP as high as 260/140 on admission   Home meds:  None  Treated w max Cleviprex post IR for Post IR tight BP control -> now off   SBP goal < 180/105  on lisinopril 20  BP stable . Long-term BP goal normotensive  Hyperlipidemia  Home meds:  None  Now on lipitor 80  TG 550->261, direct LDL 108.2, goal < 70  Continue statin at discharge  Diabetes type II Uncontrolled w/ Hyperglycemia  Home meds:  none  HgbA1c 13.5, goal < 7.0  CBGs  SSI 0-9 q4h  DB RN following  On Levemir 12u->14u bid - will decrease given TF stopped and po intake only  On Novolog 4u q4h TF coverage ->d/c when TF stopped  Dysphagia . Secondary to stroke . Speech on board . On cortrak and TF @ 70 and IVF -> d/c . Sister agreeable to PEG for placement.  . Trauma consulted.  Kary Kos on hold for possible PEG . Passed MBSS today for D1 honey thick liquids . Pt agreeable to eat . Stop TF  . Calorie count . Taking in ~40%, hold Peg today . Follow calorie count  AKI   Cre 1.28-1.26-1.15-1.19  BMP monitoring   Tobacco abuse  Current smoker  Smoking cessation counseling will be provided  Nicotine patch 14 mg daily added  Pt is willing to quit    Alcohol abuse  Heavy user as per sister  alcohol level <10  CIWA protocol   advised to drink no more than 2 drink(s) a day  Close monitor for alcohol withdraw  Cocaine abuse  Pt admitted using cocaine  Cessation education provided  Pt is  willing to quit  Avoid beta blocker at this time  Other Stroke Risk Factors  Advanced age  Obesity, Body mass index is 32.26 kg/m., recommend weight loss, diet and exercise as appropriate   Other Active Problems  Hypokalemia - resolved   Hypomagnesemia - resolved  CXR w/ Streaky bibasilar atx. RN instructed to get OOB.  Hospital day # 11  Recommend   encourage oral intake.  Transfer to skilled nursing facility today if bed available.  Patient is awaiting a second dose of Covid vaccine prior to discharge discharge home hopefully this can be done today  Antony Contras, MD  To contact Stroke Continuity provider, please refer to [email or web address] After hours, contact General Neurology

## 2020-04-16 NOTE — TOC Progression Note (Signed)
Transition of Care [facility]) - Progression Note    Patient Details  Name: Shaka Cardin MRN: [id number] Date of Birth: 06-06-1951  Transition [facility] Care [facility]) CM/SW [place], [place] Phone Number: 04/16/2020, 11:00 AM  Clinical Narrative:   CSW spoke with patient's sister to discuss SNF offers, and sister chose [facility]. [facility] initiated insurance authorization.     Expected Discharge Plan: [place] Barriers to Discharge: Continued Medical Work up  Expected Discharge Plan and Services Expected Discharge Plan: [place] In-house Referral: Clinical Social Work Discharge Planning Services: CM Consult Post Acute Care Choice: [place] Living arrangements for the past 2 months: Apartment                                       Social Determinants of Health (SDOH) Interventions    Readmission Risk Interventions No flowsheet data found.

## 2020-04-16 NOTE — TOC Progression Note (Signed)
Transition of Care [facility]) - Progression Note    Patient Details  Name: Luis Nickles MRN: [id number] Date of Birth: Jul 28, 1951  [facility]) CM/SW [place], [place] Phone Number: 04/16/2020, 4:30 PM  Clinical Narrative:   CSW updated [facility] that patient was stable for discharge today. [facility] updated CSW that they do not have any quarantine beds, as the patient has only received one dose of the COVID vaccine (unsure what date it was received). If patient is able to receive the second dose, then [facility] can take him. Otherwise, there's no bed until Wednesday.   CSW contacted [facility]W Leadership to ask about ability to get vaccine, and they are trying to see if the patient can get the vaccine while inpatient in order to discharge to SNF. It is required before he can discharge. CSW updated MD with barrier to discharge, and updated patient. Patient is agreeable to receive the vaccine, if able, prior to discharge. CSW to follow.    Expected Discharge Plan: [place] Barriers to Discharge: Continued Medical Work up  Expected Discharge Plan and Services Expected Discharge Plan: [place] In-house Referral: Clinical Social Work Discharge Planning Services: CM Consult Post Acute Care Choice: [place] Living arrangements for the past 2 months: Apartment Expected Discharge Date: 04/16/20                                     Social Determinants of Health (SDOH) Interventions    Readmission Risk Interventions No flowsheet data found.

## 2020-04-16 NOTE — Discharge Summary (Addendum)
Stroke Discharge Summary  Patient ID: Rodney Estrada   MRN: [id number]      DOB: 1951-02-14  Date of Admission: 04/05/2020 Date of Discharge: 04/19/2020  Attending Physician:  Rosalin Hawking, MD, Stroke MD Consultant(s):     Pedro Earls MD (Interventional Neuroradiologist), Leeroy Cha, MD (Physical Medicine & Rehabilitation), [facility], [place] (trauma for consideration for PEG) Patient's PCP:  No primary care provider on file.  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   atorvastatin 80 MG tablet Commonly known as: LIPITOR Take 1 tablet (80 mg total) by mouth daily.   clopidogrel 75 MG tablet Commonly known as: PLAVIX Take 1 tablet (75 mg total) by mouth daily.   diphenhydrAMINE 25 mg capsule Commonly known as: BENADRYL Take 1 capsule (25 mg total) by mouth every 6 (six) hours as needed for itching.   insulin aspart 100 UNIT/ML injection Commonly known as: novoLOG Inject 0-15 Units into the skin every 4 (four) hours.   insulin detemir 100 UNIT/ML injection Commonly known as: LEVEMIR Inject 0.08 mLs (8 Units total) into the skin 2 (two) times daily.   labetalol 100 MG tablet Commonly known as: NORMODYNE Take 1 tablet (100 mg total) by mouth 3 (three) times daily.   lisinopril 20 MG tablet Commonly known as: ZESTRIL Take 1 tablet (20 mg total) by mouth daily.   nicotine 14 mg/24hr  patch Commonly known as: NICODERM CQ - dosed in mg/24 hours Place 1 patch (14 mg total) onto the skin daily.   Resource ThickenUp Clear Powd Take 120 g by mouth as needed (honey thick liquids).            Discharge Care Instructions  (From admission, onward)         Start     Ordered   04/16/20 0000  Discharge wound care:       Comments: See wound care on AVS   04/16/20 1406          LABORATORY STUDIES CBC    Component Value Date/Time   WBC 8.6 04/19/2020 0622   RBC 3.91 (L) 04/19/2020 0622   HGB 11.2 (L) 04/19/2020 0622   HCT 34.9 (L) 04/19/2020 0622   PLT 316 04/19/2020 0622   MCV 89.3 04/19/2020 0622   MCH 28.6 04/19/2020 0622   MCHC 32.1 04/19/2020 0622   RDW 13.2 04/19/2020 0622   LYMPHSABS 1.8 04/11/2020 0435   MONOABS 1.0 04/11/2020 0435   EOSABS 0.4 04/11/2020 0435   BASOSABS 0.0 04/11/2020 0435   CMP    Component Value Date/Time   NA 146 (H) 04/19/2020 0622   K 3.6 04/19/2020 0622   CL 115 (H) 04/19/2020 0622   CO2 22 04/19/2020 0622   GLUCOSE 141 (H) 04/19/2020 0622   BUN 19 04/19/2020 0622   CREATININE 1.25 (H) 04/19/2020  7322   CALCIUM 8.7 (L) 04/19/2020 0622   PROT 7.0 04/05/2020 1049   ALBUMIN 3.6 04/05/2020 1049   AST 39 04/05/2020 1049   ALT 38 04/05/2020 1049   ALKPHOS 80 04/05/2020 1049   BILITOT 0.8 04/05/2020 1049   GFRNONAA 59 (L) 04/19/2020 0622   GFRAA >60 04/19/2020 0622   COAGS Lab Results  Component Value Date   INR 1.0 04/05/2020   Lipid Panel    Component Value Date/Time   CHOL 206 (H) 04/06/2020 0613   TRIG 261 (H) 04/07/2020 0421   HDL 31 (L) 04/06/2020 0613   CHOLHDL 6.6 04/06/2020 0613   VLDL UNABLE TO CALCULATE IF TRIGLYCERIDE OVER 400 mg/dL 04/06/2020 0613   LDLCALC UNABLE TO CALCULATE IF TRIGLYCERIDE OVER 400 mg/dL 04/06/2020 0613   HgbA1C  Lab Results  Component Value Date   HGBA1C 13.5 (H) 04/06/2020   Urinalysis    Component Value Date/Time   COLORURINE YELLOW 04/05/2020 1956   APPEARANCEUR  CLEAR 04/05/2020 1956   LABSPEC 1.041 (H) 04/05/2020 1956   PHURINE 5.0 04/05/2020 1956   GLUCOSEU >=500 (A) 04/05/2020 1956   HGBUR SMALL (A) 04/05/2020 1956   BILIRUBINUR NEGATIVE 04/05/2020 1956   KETONESUR NEGATIVE 04/05/2020 1956   PROTEINUR 100 (A) 04/05/2020 1956   NITRITE NEGATIVE 04/05/2020 1956   LEUKOCYTESUR NEGATIVE 04/05/2020 1956   Urine Drug Screen     Component Value Date/Time   LABOPIA NONE DETECTED 04/05/2020 1956   COCAINSCRNUR POSITIVE (A) 04/05/2020 1956   LABBENZ NONE DETECTED 04/05/2020 1956   AMPHETMU NONE DETECTED 04/05/2020 1956   THCU NONE DETECTED 04/05/2020 1956   LABBARB NONE DETECTED 04/05/2020 1956    Alcohol Level    Component Value Date/Time   ETH <10 04/05/2020 1049     SIGNIFICANT DIAGNOSTIC STUDIES CT ANGIO HEAD W OR WO CONTRAST  Result Date: 04/10/2020 CLINICAL DATA:  Stroke. Thrombectomy and right MCA stent. TPA administered. EXAM: CT ANGIOGRAPHY HEAD TECHNIQUE: Multidetector CT imaging of the head was performed using the standard protocol during bolus administration of intravenous contrast. Multiplanar CT image reconstructions and MIPs were obtained to evaluate the vascular anatomy. CONTRAST:  44m OMNIPAQUE IOHEXOL 350 MG/ML SOLN COMPARISON:  CT head 04/08/2020.  MRI head 04/06/2020 FINDINGS: CT HEAD Brain: Large territory right MCA infarct with diffuse low density. Decreased edema compared to the prior study. There is also infarct in the head of the caudate on the right. No associated hemorrhage. No midline shift. Ventricle size normal. Chronic ischemic changes in the white matter and basal ganglia bilaterally. Vascular: Hyperdense vessels in right MCA branches. Hyperdensity likely related to edema in the infarct. This is unchanged. Right MCA stent unchanged in position. Skull: No focal skeletal lesion. Sinuses: Mild mucosal edema paranasal sinuses. Orbits: Negative CTA HEAD Anterior circulation: Moderate stenosis right supraclinoid internal  carotid artery due to calcific stenosis. Both anterior cerebral arteries widely patent. Left middle cerebral artery patent without significant stenosis. Atherosclerotic calcification right cavernous carotid with mild stenosis. Moderate stenosis supraclinoid internal carotid artery on the right. Stent in the right middle cerebral artery is patent with normal opacification of right MCA branches. Posterior circulation: Both vertebral arteries patent to the basilar. No significant stenosis. Mild atherosclerotic calcification distal right vertebral artery. PICA patent bilaterally. Basilar widely patent. Severe stenosis left P2 segment. Mild stenosis right posterior cerebral artery. Venous sinuses: Normal venous enhancement Anatomic variants: None IMPRESSION: 1. Subacute infarct right MCA territory with decreased cytotoxic edema. No hemorrhage or new infarct. 2. Moderate stenosis right  supraclinoid internal carotid artery due to circumferential calcific stenosis. Right MCA stent is patent with good flow to right MCA branches. 3. Moderate stenosis left supraclinoid internal carotid artery due to calcific stenosis. Left middle cerebral artery widely patent. Anterior cerebral arteries patent bilaterally. 4. Severe stenosis left P2 segment. Mild stenosis right posterior cerebral artery. Electronically Signed   By: Franchot Gallo M.D.   On: 04/10/2020 10:51   DG Chest 2 View  Result Date: 04/12/2020 CLINICAL DATA:  History of CVA one week ago. EXAM: CHEST - 2 VIEW COMPARISON:  None. FINDINGS: The heart is mildly enlarged. Mild tortuosity and calcification of the thoracic aorta. Feeding tube is coursing down the esophagus and into the stomach. Streaky bibasilar atelectasis but no infiltrates, edema or effusions. IMPRESSION: Mild cardiac enlargement and streaky bibasilar atelectasis. Electronically Signed   By: Marijo Sanes M.D.   On: 04/12/2020 07:57   CT HEAD WO CONTRAST  Result Date: 04/08/2020 CLINICAL DATA:   Stroke, follow-up. EXAM: CT HEAD WITHOUT CONTRAST TECHNIQUE: Contiguous axial images were obtained from the base of the skull through the vertex without intravenous contrast. COMPARISON:  MRI/MRA head 04/06/2020, head CT 04/05/2020 FINDINGS: Brain: Cerebral volume is normal. Again demonstrated are extensive changes of subacute ischemic infarction within the right MCA vascular territory, within the right frontal, parietal, temporal and lateral right occipito lobes as well as right insula and subinsular region and right basal ganglia. The infarct is not appear significantly changed in extent as compared to the MRI of 04/06/2020. As before, there is mild associated petechial hemorrhage. There is regional mass effect with partial effacement of the right temporal horn. No midline shift. Stable background chronic small vessel ischemic disease. No extra-axial fluid collection. No evidence of intracranial mass. No midline shift. Vascular: Right M1 MCA stent. There is asymmetric hyperdensity of M2 branch vessels within the right sylvian fissure. Skull: Normal. Negative for fracture or focal lesion. Sinuses/Orbits: Visualized orbits show no acute finding. Small right maxillary sinus mucous retention cyst. Partially imaged nasoenteric tube. No significant mastoid effusion. Impression #2 will be called to the ordering clinician or representative by the Radiologist Assistant, and communication documented in the PACS or [employer]. IMPRESSION: Extensive subacute ischemic infarction changes within the right MCA vascular territory, not significantly changed in extent as compared to the MRI of 04/06/2020. Region mass effect with partial effacement of the right temporal horn. No midline shift. As before, there is mild associated petechial hemorrhage. Redemonstrated M1 right middle cerebral artery stent. There is asymmetric hyperdensity of M2 MCA vessels within the right sylvian fissure and thrombus within these vessels cannot  be excluded. Stable background chronic small vessel ischemic disease. Electronically Signed   By: Kellie Simmering DO   On: 04/08/2020 10:25   CT HEAD WO CONTRAST  Result Date: 04/05/2020 CLINICAL DATA:  Stroke. Thrombectomy and stenting right MCA today. Patient received tPA. EXAM: CT HEAD WITHOUT CONTRAST TECHNIQUE: Contiguous axial images were obtained from the base of the skull through the vertex without intravenous contrast. COMPARISON:  CT head earlier today FINDINGS: Brain: Interval development of low-density in the right MCA territory compatible with acute infarct. This involves the insula, lateral temporal lobe, right frontal lobe and right basal ganglia. There is mild hyperdensity in the right putamen which may represent contrast staining or slight hemorrhage. Otherwise no acute hemorrhage. Generalized atrophy without hydrocephalus. 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 Vascular: Interval stenting right MCA . No hyperdense vessel. Contrast enhanced vessels due to earlier  angiography. Skull: Negative Sinuses/Orbits: Mild mucosal edema paranasal sinuses. No orbital lesion. Other: None IMPRESSION: Right MCA thrombectomy and stenting earlier today. Developing right MCA infarct. Mild hyperdensity in the right putamen which may represent contrast staining from acute infarct versus mild hemorrhage. These results were called by telephone at the time of interpretation on 04/05/2020 at 5:14 pm to provider [place] , who verbally acknowledged these results. Electronically Signed   By: Franchot Gallo M.D.   On: 04/05/2020 17:15   MR ANGIO HEAD WO CONTRAST  Result Date: 04/06/2020 CLINICAL DATA:  Stroke post thrombectomy and stent placement EXAM: MRI HEAD WITHOUT CONTRAST MRA HEAD WITHOUT CONTRAST TECHNIQUE: Multiplanar, multiecho pulse sequences of the brain and surrounding structures were obtained without intravenous contrast. Angiographic images of the head were obtained  using MRA technique without contrast. COMPARISON:  Prior CT imaging FINDINGS: MRI HEAD Brain: There is restricted diffusion in the right MCA territory involving temporal, frontal, and parietal lobes as well as insula. There is some extension to the lateral right occipital lobe and there is involvement of the basal ganglia. Minimal involvement of right ACA territory is also noted with foci of restricted diffusion in the parasagittal frontal lobe. There is minimal corresponding susceptibility noted for example along the right body of caudate and in the right parietal lobe reflecting petechial hemorrhage. There is no significant mass effect. No hydrocephalus. Prominence of ventricles and sulci reflects generalized parenchymal volume loss. There are chronic small vessel infarcts of the central white matter and deep gray nuclei bilaterally as well as the right cerebellar hemisphere. Vascular: Major vessel flow voids at the skull base are preserved. There is susceptibility along the right M1 MCA related to stent placement. Skull and upper cervical spine: Normal marrow signal is preserved. Sinuses/Orbits: Minor mucosal thickening.  Orbits are unremarkable. Other: Sella is unremarkable.  Mastoid air cells are clear. MRA HEAD Motion artifact is present with findings within this limitation. Intracranial internal carotid arteries are patent. There is loss of flow related enhancement along the mid to distal right M1 MCA secondary to susceptibility artifact from stent placement. Right middle cerebral artery otherwise appears patent. Both anterior cerebral and left middle cerebral arteries are patent. Intracranial vertebral arteries, basilar artery, posterior cerebral arteries are patent. Right posterior communicating artery is present and patent. IMPRESSION: Evolving acute right MCA territory infarction involving basal ganglia, insula, and temporal, frontal, and parietal lobes. Minimal petechial hemorrhage. No significant mass  effect. Interval right M1 MCA stent placement with associated artifact appears patent. Electronically Signed   By: Macy Mis M.D.   On: 04/06/2020 11:37   MR BRAIN WO CONTRAST  Result Date: 04/06/2020 CLINICAL DATA:  Stroke post thrombectomy and stent placement EXAM: MRI HEAD WITHOUT CONTRAST MRA HEAD WITHOUT CONTRAST TECHNIQUE: Multiplanar, multiecho pulse sequences of the brain and surrounding structures were obtained without intravenous contrast. Angiographic images of the head were obtained using MRA technique without contrast. COMPARISON:  Prior CT imaging FINDINGS: MRI HEAD Brain: There is restricted diffusion in the right MCA territory involving temporal, frontal, and parietal lobes as well as insula. There is some extension to the lateral right occipital lobe and there is involvement of the basal ganglia. Minimal involvement of right ACA territory is also noted with foci of restricted diffusion in the parasagittal frontal lobe. There is minimal corresponding susceptibility noted for example along the right body of caudate and in the right parietal lobe reflecting petechial hemorrhage. There is no significant mass effect. No hydrocephalus. Prominence  of ventricles and sulci reflects generalized parenchymal volume loss. There are chronic small vessel infarcts of the central white matter and deep gray nuclei bilaterally as well as the right cerebellar hemisphere. Vascular: Major vessel flow voids at the skull base are preserved. There is susceptibility along the right M1 MCA related to stent placement. Skull and upper cervical spine: Normal marrow signal is preserved. Sinuses/Orbits: Minor mucosal thickening.  Orbits are unremarkable. Other: Sella is unremarkable.  Mastoid air cells are clear. MRA HEAD Motion artifact is present with findings within this limitation. Intracranial internal carotid arteries are patent. There is loss of flow related enhancement along the mid to distal right M1 MCA  secondary to susceptibility artifact from stent placement. Right middle cerebral artery otherwise appears patent. Both anterior cerebral and left middle cerebral arteries are patent. Intracranial vertebral arteries, basilar artery, posterior cerebral arteries are patent. Right posterior communicating artery is present and patent. IMPRESSION: Evolving acute right MCA territory infarction involving basal ganglia, insula, and temporal, frontal, and parietal lobes. Minimal petechial hemorrhage. No significant mass effect. Interval right M1 MCA stent placement with associated artifact appears patent. Electronically Signed   By: Macy Mis M.D.   On: 04/06/2020 11:37   IR Intra Cran Stent  Result Date: 04/06/2020 INDICATION: 69 year old male with past medical history significant for tobacco use. He head sudden onset of right gaze deviation left-side paralysis. Systolic blood pressure was 260/149 mmHg upon arrival. His last known well was 10 a.m. on 04/05/2020. Premorbid Rankin scale was 0 and NIHSS at presentation was 19. IV tPA was started at 11:16 a.m. head CT showed no evidence of intracranial hemorrhage with remote strokes noted in the bilateral centrum semiovale, basal ganglia and right thalamus. CT angiogram of the head and neck showed for a proximal right M1/MCA occlusion. He was then transferred to our service for diagnostic cerebral angiogram and mechanical thrombectomy. EXAM: IR CT HEAD LIMITED; INTRACRANIAL STENT (INCL PTA); IR PERCUTANEOUS ART THORMBECTOMY/INFUSION INTRACRANIAL INCLUDE DIAG ANGIO COMPARISON:  CT/CT angiogram of the head and neck 04/05/2020. MEDICATIONS: IV bolus fallowed by infusion of cangrelor. ANESTHESIA/SEDATION: The procedure was performed under general anesthesia. FLUOROSCOPY TIME:  Fluoroscopy Time: 29 minutes 18 seconds (1,626 mGy). COMPLICATIONS: None immediate. TECHNIQUE: Unable to obtain informed consent as patient could not consent for himself and no next of kin or health  care proxy was available. Case discussed with neuro hospitalist team and decision made that proceed with intervention was in the best interest of the patient. Maximal Sterile Barrier Technique was utilized including caps, mask, sterile gowns, sterile gloves, sterile drape, hand hygiene and skin antiseptic. A timeout was performed prior to the initiation of the procedure. The right groin was prepped and draped in the usual sterile fashion. Using a micropuncture kit and the modified Seldinger technique, access was gained to the right common femoral artery and an 8 French sheath was placed. Under fluoroscopy, an 8 [place] Walrus balloon guide catheter was navigated over a 6 [place] Berenstein 2 catheter and a 0.035" Terumo Glidewire into the aortic arch. The catheter was placed into the right common carotid artery and then advanced into the left internal carotid artery. The inner catheter was removed. Frontal and lateral angiograms of the head were obtained. FINDINGS: 1. Increased tortuosity of the cervical right ICA. Luminal irregularity along the petrous and cavernous segment of the right ICA, consistent with atherosclerotic disease with focal area of mild stenosis at the petrous segment. 2. Complete occlusion of the proximal M1 segment of the right  MCA. 3. Opacification of the right ACA, left anterior and posterior circulation related to prominent right posterior communicating artery and anterior communicating artery. PROCEDURE: Under biplane roadmap, a Zoom 71 aspiration catheter was navigated over a phenom 21 microcatheter and a synchro support microguidewire into the cavernous segment of the right ICA. The microcatheter was removed. The aspiration catheter was advanced to the level of occlusion and connected to a penumbra aspiration pump. Continuous aspiration was performed for maintenance. The guiding catheter balloon was inflated. The aspiration catheter was removed under constant aspiration. Follow-up right ICA  angiogram showed persistent right M1 occlusion. Under biplane roadmap, a Zoom 71 aspiration catheter was navigated over a phenom 21 microcatheter and a synchro support microguidewire into the cavernous segment of the right ICA. The microcatheter was then navigated over the wire into the right M2/MCA superior division branch. Then, a 6 x 40 mm solitaire stent retriever was deployed spanning the right M1 and M2 segments. The device was allowed to intercalated with the clot for 4 minutes. The microcatheter was removed. The aspiration catheter was advanced to the level of occlusion and connected to a penumbra aspiration pump. The guiding catheter balloon was inflated. The thrombectomy device and aspiration catheter were removed under constant aspiration. Follow-up right ICA angiogram showed complete recanalization of the right MCA territory (TICI 3). Atherosclerotic stenosis at the mid M1 segment was noted. Follow-up angiogram showed slow flow in the right MCA territory. Flat panel CT of the head was obtained and post processed in a separate workstation with concurrent attending physician supervision. Selected images were sent to PACS. No evidence of hemorrhage is noted. Right ICA angiograms with frontal and lateral views of the head showed worsening delay of filling of the right MCA vascular tree with severe stenosis at the M1 segment. Using biplane roadmap, a 2.5 mm x 9 mm Gateway balloon was navigated into the right M1 segment over a Zoom 14 wire. Angioplasty performed under fluoroscopy. Follow-up right ICA angiogram showed significant improvement with brisk filling of the right MCA territory. Delayed follow-up showed restenoses of the M1 segment with delayed flow in the MCA territory. Patient received IV bolus of cangrelor followed by low-dose infusion. A 2.5 mm x 15 mm resolute the chronic stent was navigated into the right MCA and deployed across the stenotic M1 segment. Follow-up angiogram showed resolution of  the M1 stenosis with brisk anterograde flow in the right MCA territory. Delayed follow-up angiogram showed persistent patency of the stent with no evidence of in stent platelet aggregation. No evidence of thromboembolic complication. The catheter was subsequently withdrawn. Flat panel CT of the head was obtained and post processed in a separate workstation with concurrent attending physician supervision. Selected images were sent to PACS. No evidence of hemorrhage noted. Right common femoral artery angiograms were performed with right anterior oblique and lateral views via sheath side or contrast injection. Atherosclerotic changes of the external iliac and common femoral artery are noted without hemodynamically significant stenosis. The sheath was exchanged over the wire for an 8 [place] changes seen which was utilized for access closure. Immediate hemostasis was achieved. Incidentally noted is left-sided hydronephrosis. IMPRESSION: 1. Mechanical thrombectomy performed with 2 passes (1 pass aspiration and 1 pass combined technique) with complete recanalization of the right MCA vascular tree (TICI3). 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 2. Angioplasty performed at the stenosis M1 segment with improvement of the flow. Re-stenosis noted on follow-up angiogram. 3.  Stenting of the right M1 segment performed with resolution of the M1 segment stenosis and brisk anterograde flow in the right MCA vascular tree. PLAN: 1. Continued cangrelor infusion until head CT is obtained at 4 hours post procedure. 2. Loading of dual anti-platelet in case no hemorrhage seen on follow-up head followed by dual anti-platelet therapy for 6 months. 3. Bed rest post femoral access + tPA and cangrelor. Electronically Signed   By: Pedro Earls M.D.   On: 04/06/2020 16:10   IR CT Head Ltd  Result Date: 04/06/2020 INDICATION: 69 year old male  with past medical history significant for tobacco use. He head sudden onset of right gaze deviation left-side paralysis. Systolic blood pressure was 260/149 mmHg upon arrival. His last known well was 10 a.m. on 04/05/2020. Premorbid Rankin scale was 0 and NIHSS at presentation was 19. IV tPA was started at 11:16 a.m. head CT showed no evidence of intracranial hemorrhage with remote strokes noted in the bilateral centrum semiovale, basal ganglia and right thalamus. CT angiogram of the head and neck showed for a proximal right M1/MCA occlusion. He was then transferred to our service for diagnostic cerebral angiogram and mechanical thrombectomy. EXAM: IR CT HEAD LIMITED; INTRACRANIAL STENT (INCL PTA); IR PERCUTANEOUS ART THORMBECTOMY/INFUSION INTRACRANIAL INCLUDE DIAG ANGIO COMPARISON:  CT/CT angiogram of the head and neck 04/05/2020. MEDICATIONS: IV bolus fallowed by infusion of cangrelor. ANESTHESIA/SEDATION: The procedure was performed under general anesthesia. FLUOROSCOPY TIME:  Fluoroscopy Time: 29 minutes 18 seconds (1,626 mGy). COMPLICATIONS: None immediate. TECHNIQUE: Unable to obtain informed consent as patient could not consent for himself and no next of kin or health care proxy was available. Case discussed with neuro hospitalist team and decision made that proceed with intervention was in the best interest of the patient. Maximal Sterile Barrier Technique was utilized including caps, mask, sterile gowns, sterile gloves, sterile drape, hand hygiene and skin antiseptic. A timeout was performed prior to the initiation of the procedure. The right groin was prepped and draped in the usual sterile fashion. Using a micropuncture kit and the modified Seldinger technique, access was gained to the right common femoral artery and an 8 French sheath was placed. Under fluoroscopy, an 8 [place] Walrus balloon guide catheter was navigated over a 6 [place] Berenstein 2 catheter and a 0.035" Terumo Glidewire into the aortic  arch. The catheter was placed into the right common carotid artery and then advanced into the left internal carotid artery. The inner catheter was removed. Frontal and lateral angiograms of the head were obtained. FINDINGS: 1. Increased tortuosity of the cervical right ICA. Luminal irregularity along the petrous and cavernous segment of the right ICA, consistent with atherosclerotic disease with focal area of mild stenosis at the petrous segment. 2. 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 3. Opacification of the right ACA, left anterior and posterior circulation related to prominent right posterior communicating artery and anterior communicating artery. PROCEDURE: Under biplane roadmap, a Zoom 71 aspiration catheter was navigated over a phenom 21 microcatheter and a synchro support microguidewire into the cavernous segment of the right ICA. The microcatheter was removed. The aspiration catheter was advanced to the level of occlusion and connected to a penumbra aspiration pump. Continuous aspiration was performed for maintenance. The guiding catheter balloon was inflated. The aspiration catheter was removed under constant aspiration. Follow-up right ICA angiogram showed persistent right M1 occlusion. Under biplane roadmap, a Zoom 71 aspiration catheter was navigated over a phenom 21 microcatheter and a synchro  support microguidewire into the cavernous segment of the right ICA. The microcatheter was then navigated over the wire into the right M2/MCA superior division branch. Then, a 6 x 40 mm solitaire stent retriever was deployed spanning the right M1 and M2 segments. The device was allowed to intercalated with the clot for 4 minutes. The microcatheter was removed. The aspiration catheter was advanced to the level of occlusion and connected to a penumbra aspiration pump. The guiding catheter balloon was inflated. The thrombectomy device and aspiration catheter were removed under constant  aspiration. Follow-up right ICA angiogram showed complete recanalization of the right MCA territory (TICI 3). Atherosclerotic stenosis at the mid M1 segment was noted. Follow-up angiogram showed slow flow in the right MCA territory. Flat panel CT of the head was obtained and post processed in a separate workstation with concurrent attending physician supervision. Selected images were sent to PACS. No evidence of hemorrhage is noted. Right ICA angiograms with frontal and lateral views of the head showed worsening delay of filling of the right MCA vascular tree with severe stenosis at the M1 segment. Using biplane roadmap, a 2.5 mm x 9 mm Gateway balloon was navigated into the right M1 segment over a Zoom 14 wire. Angioplasty performed under fluoroscopy. Follow-up right ICA angiogram showed significant improvement with brisk filling of the right MCA territory. Delayed follow-up showed restenoses of the M1 segment with delayed flow in the MCA territory. Patient received IV bolus of cangrelor followed by low-dose infusion. A 2.5 mm x 15 mm resolute the chronic stent was navigated into the right MCA and deployed across the stenotic M1 segment. Follow-up angiogram showed resolution of the M1 stenosis with brisk anterograde flow in the right MCA territory. Delayed follow-up angiogram showed persistent patency of the stent with no evidence of in stent platelet aggregation. No evidence of thromboembolic complication. The catheter was subsequently withdrawn. Flat panel CT of the head was obtained and post processed in a separate workstation with concurrent attending physician supervision. Selected images were sent to PACS. No evidence of hemorrhage noted. Right common femoral artery angiograms were performed with right anterior oblique and lateral views via sheath side or contrast injection. Atherosclerotic changes of the external iliac and common femoral artery are noted without hemodynamically significant stenosis. The  sheath was exchanged over the wire for an 8 [place] changes seen which was utilized for access closure. Immediate hemostasis was achieved. Incidentally noted is left-sided hydronephrosis. IMPRESSION: 1. Mechanical thrombectomy performed with 2 passes (1 pass aspiration and 1 pass combined technique) with complete recanalization of the right MCA vascular tree (TICI3). 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 2. Angioplasty performed at the stenosis M1 segment with improvement of the flow. Re-stenosis noted on follow-up angiogram. 3. Stenting of the right M1 segment performed with resolution of the M1 segment stenosis and brisk anterograde flow in the right MCA vascular tree. PLAN: 1. Continued cangrelor infusion until head CT is obtained at 4 hours post procedure. 2. Loading of dual anti-platelet in case no hemorrhage seen on follow-up head followed by dual anti-platelet therapy for 6 months. 3. Bed rest post femoral access + tPA and cangrelor. Electronically Signed   By: Pedro Earls M.D.   On: 04/06/2020 16:10   CT C-SPINE NO CHARGE  Result Date: 04/05/2020 CLINICAL DATA:  Stroke. EXAM: CT CERVICAL SPINE WITHOUT CONTRAST TECHNIQUE: Multidetector CT imaging of the cervical spine was performed without intravenous contrast. Multiplanar  CT image reconstructions were also generated. COMPARISON:  Open FINDINGS: Alignment: Cervical dextrocurvature. Straightening of the expected cervical lordosis. No significant spondylolisthesis. Skull base and vertebrae: The basion-dental and atlanto-dental intervals are maintained.No evidence of acute fracture to the cervical spine. Well-corticated fragment adjacent to the T1 spinous process, likely chronic Soft tissues and spinal canal: No prevertebral fluid or swelling. No visible canal hematoma. Nonspecific 11 mm cystic appearing subcutaneous lesion within the mid right neck  (series 18, image 66). Disc levels: Cervical spondylosis. Most notably at C5-C6 and C6-C7, there is advanced disc space narrowing with posterior disc osteophytes as well as uncovertebral and facet hypertrophy. Bilateral neural foraminal narrowing with suspected at least moderate spinal canal stenosis at C5-C6. Bilateral neural foraminal narrowing with suspected moderate/severe spinal canal stenosis at C6-C7 Upper chest: No consolidation within the imaged lung apices. No visible pneumothorax. IMPRESSION: No evidence of acute fracture to the cervical spine. Cervical spondylosis as described and greatest at C5-C6 and C6-C7. Bilateral neural foraminal narrowing with suspected moderate/severe spinal canal stenosis at C6-C7. Bilateral neural foraminal narrowing with suspected at least moderate spinal canal stenosis at C5-C6 Cervical dextrocurvature. Nonspecific cystic appearing 11 mm subcutaneous lesion within the mid right neck. Electronically Signed   By: Kellie Simmering DO   On: 04/05/2020 11:59   DG Abd Portable 1V  Result Date: 04/05/2020 CLINICAL DATA:  69 year old male status post feeding tube placement. EXAM: PORTABLE ABDOMEN - 1 VIEW COMPARISON:  None. FINDINGS: Partially visualized enteric tube with side port and tip in the left upper abdomen likely in the proximal stomach. No bowel dilatation. There is severe dilatation of the left renal collecting system containing excreted contrast. There is atherosclerotic calcification of the aorta. The osseous structures are intact. IMPRESSION: 1. Enteric tube with tip in the proximal stomach. 2. Severe left hydronephrosis. Electronically Signed   By: Anner Crete M.D.   On: 04/05/2020 19:42   DG Swallowing Func-Speech Pathology  Result Date: 04/13/2020 Objective Swallowing Evaluation: Type of Study: MBS-Modified Barium Swallow Study  Patient Details Name: Coltin Casher MRN: [id number] Date of Birth: 1951/02/26 Today's Date: 04/13/2020 Time: SLP Start Time (ACUTE  ONLY): 1342 -SLP Stop Time (ACUTE ONLY): 1359 SLP Time Calculation (min) (ACUTE ONLY): 17 min Past Medical History: No past medical history on file. Past Surgical History: Past Surgical History: Procedure Laterality Date  IR CT HEAD LTD  04/05/2020  IR INTRA CRAN STENT  04/05/2020  IR PERCUTANEOUS ART THROMBECTOMY/INFUSION INTRACRANIAL INC DIAG ANGIO  04/05/2020     IR PERCUTANEOUS ART THROMBECTOMY/INFUSION INTRACRANIAL INC DIAG ANGIO  04/05/2020  RADIOLOGY WITH ANESTHESIA N/A 04/05/2020  Procedure: IR WITH ANESTHESIA;  Surgeon: Radiologist, Medication, MD;  Location: [facility];  Service: Radiology;  Laterality: N/A; HPI: Patient is a 69 y/o male who presents with acute CVA s/p cerebral arteriogram with emergent mechanical thrombectomy of right MCA M1 occlusion along with stent assisted angioplasty of right MCA M1 04/05/20. s/p tpA. + cocaine. 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 No PMH.  Subjective: Pt's level of alertness was notably improved compared to prior sessions. Assessment / Plan / Recommendation CHL IP CLINICAL IMPRESSIONS 04/13/2020 Clinical Impression Pt presents with oropharyngeal dysphagia characterized by labial weakness, impaired bolus cohesion, reduced lingual retraction, reduced pharyngeal constriction, a pharyngeal delay, and reduced anterior laryngeal movement. He demonstrated left-sided anterior spillage of liquids, premature spillage of liquids to the valleculae and pyriform sinuses, vallecular residue, posterior pharyngeal wall residue with more advanced  solids, intermittent incomplete epiglottic inversion, penetration (PAS 3,5) and aspiration (PAS 7,8) with a single instance of delayed coughing which was ineffective in expelling the aspirant. A dysphagia 1 (puree) diet with honey thick liquids is recommended at this time. A chin tuck posture was effective in eliminating laryngeal invasion. However, pt  was unable to demonstrate this effectively and consistently during this study. Pt chin tuck was partial or pt raised his head prior to completion of the study, aspiration was still observed. Pt's diet may be advanced to include nectar thick liquids when he is able to consistently demonstrate use of this strategy.  SLP Visit Diagnosis Dysphagia, oropharyngeal phase (R13.12) Attention and concentration deficit following -- Frontal lobe and executive function deficit following -- Impact on safety and function Moderate aspiration risk   CHL IP TREATMENT RECOMMENDATION 04/13/2020 Treatment Recommendations Therapy as outlined in treatment plan below   Prognosis 04/13/2020 Prognosis for Safe Diet Advancement Good Barriers to Reach Goals -- Barriers/Prognosis Comment -- CHL IP DIET RECOMMENDATION 04/13/2020 SLP Diet Recommendations Dysphagia 1 (Puree) solids;Honey thick liquids Liquid Administration via Cup;No straw Medication Administration Whole meds with puree Compensations Minimize environmental distractions;Small sips/bites;Follow solids with liquid;Multiple dry swallows after each bite/sip Postural Changes Seated upright at 90 degrees   CHL IP OTHER RECOMMENDATIONS 04/13/2020 Recommended Consults -- Oral Care Recommendations Oral care BID Other Recommendations Order thickener from pharmacy   CHL IP FOLLOW UP RECOMMENDATIONS 04/13/2020 Follow up Recommendations Skilled Nursing facility   [facility] IP FREQUENCY AND DURATION 04/13/2020 Speech Therapy Frequency (ACUTE ONLY) min 2x/week Treatment Duration 2 weeks      CHL IP ORAL PHASE 04/13/2020 Oral Phase Impaired Oral - Pudding Teaspoon -- Oral - Pudding Cup -- Oral - Honey Teaspoon -- Oral - Honey Cup -- Oral - Nectar Teaspoon -- Oral - Nectar Cup Left anterior bolus loss;Decreased bolus cohesion Oral - Nectar Straw Left anterior bolus loss;Decreased bolus cohesion Oral - Thin Teaspoon -- Oral - Thin Cup Left anterior bolus loss;Decreased bolus cohesion Oral - Thin Straw Left  anterior bolus loss;Decreased bolus cohesion Oral - Puree Left anterior bolus loss;Decreased bolus cohesion Oral - Mech Soft Decreased bolus cohesion Oral - Regular Impaired mastication Oral - Multi-Consistency -- Oral - Pill Left anterior bolus loss;Decreased bolus cohesion Oral Phase - Comment --  CHL IP PHARYNGEAL PHASE 04/13/2020 Pharyngeal Phase Impaired Pharyngeal- Pudding Teaspoon -- Pharyngeal -- Pharyngeal- Pudding Cup -- Pharyngeal -- Pharyngeal- Honey Teaspoon -- Pharyngeal -- Pharyngeal- Honey Cup Delayed swallow initiation-vallecula;Pharyngeal residue - valleculae;Reduced tongue base retraction;Reduced anterior laryngeal mobility Pharyngeal -- Pharyngeal- Nectar Teaspoon -- Pharyngeal -- Pharyngeal- Nectar Cup Delayed swallow initiation-vallecula;Pharyngeal residue - valleculae;Reduced tongue base retraction;Reduced anterior laryngeal mobility;Penetration/Aspiration before swallow;Penetration/Aspiration during swallow;Penetration/Apiration after swallow Pharyngeal Material enters airway, remains ABOVE vocal cords and not ejected out;Material enters airway, CONTACTS cords and not ejected out;Material enters airway, passes BELOW cords and not ejected out despite cough attempt by patient;Material enters airway, passes BELOW cords without attempt by patient to eject out (silent aspiration) Pharyngeal- Nectar Straw Delayed swallow initiation-vallecula;Pharyngeal residue - valleculae;Reduced tongue base retraction;Reduced anterior laryngeal mobility;Penetration/Aspiration before swallow;Penetration/Aspiration during swallow;Penetration/Apiration after swallow Pharyngeal Material enters airway, remains ABOVE vocal cords and not ejected out;Material enters airway, CONTACTS cords and not ejected out;Material enters airway, passes BELOW cords and not ejected out despite cough attempt by patient;Material enters airway, passes BELOW cords without attempt by patient to eject out (silent aspiration) Pharyngeal- Thin  Teaspoon -- Pharyngeal -- Pharyngeal- Thin Cup Delayed swallow initiation-vallecula;Pharyngeal residue - valleculae;Reduced tongue base retraction;Reduced anterior  laryngeal mobility;Penetration/Aspiration before swallow;Penetration/Aspiration during swallow;Penetration/Apiration after swallow Pharyngeal Material enters airway, remains ABOVE vocal cords and not ejected out;Material enters airway, CONTACTS cords and not ejected out;Material enters airway, passes BELOW cords without attempt by patient to eject out (silent aspiration) Pharyngeal- Thin Straw Delayed swallow initiation-vallecula;Pharyngeal residue - valleculae;Reduced tongue base retraction;Reduced anterior laryngeal mobility;Penetration/Aspiration before swallow;Penetration/Aspiration during swallow;Penetration/Apiration after swallow Pharyngeal Material enters airway, remains ABOVE vocal cords and not ejected out;Material enters airway, CONTACTS cords and not ejected out;Material enters airway, passes BELOW cords without attempt by patient to eject out (silent aspiration) Pharyngeal- Puree Pharyngeal residue - valleculae;Reduced anterior laryngeal mobility;Reduced tongue base retraction;Delayed swallow initiation-vallecula Pharyngeal -- Pharyngeal- Mechanical Soft Pharyngeal residue - valleculae;Reduced anterior laryngeal mobility;Reduced tongue base retraction;Delayed swallow initiation-vallecula;Pharyngeal residue - posterior pharnyx Pharyngeal -- Pharyngeal- Regular Pharyngeal residue - valleculae;Reduced anterior laryngeal mobility;Reduced tongue base retraction;Delayed swallow initiation-vallecula Pharyngeal -- Pharyngeal- Multi-consistency -- Pharyngeal -- Pharyngeal- Pill -- Pharyngeal -- Pharyngeal Comment --  CHL IP CERVICAL ESOPHAGEAL PHASE 04/13/2020 Cervical Esophageal Phase WFL Pudding Teaspoon -- Pudding Cup -- Honey Teaspoon -- Honey Cup -- Nectar Teaspoon -- Nectar Cup -- Nectar Straw -- Thin Teaspoon -- Thin Cup -- Thin Straw -- Puree  -- Mechanical Soft -- Regular -- Multi-consistency -- Pill -- Cervical Esophageal Comment -- Shanika I. Hardin Negus, [place], [place] Office number [phone] Pager [phone] [address] 04/13/2020, 3:00 PM              ECHOCARDIOGRAM COMPLETE  Result Date: 04/06/2020    ECHOCARDIOGRAM REPORT   Patient Name:   DECLAN MIER Date of Exam: 04/06/2020 Medical Rec #:  [id number]    Height:       69.0 in Accession #:    [id number]   Weight:       219.1 lb Date of Birth:  1950/12/28     BSA:          2.148 m Patient Age:    69 years     BP:           126/111 mmHg Patient Gender: M            HR:           89 bpm. Exam Location:  Inpatient Procedure: 2D Echo Indications:    stroke 434.91  History:        Patient has no prior history of Echocardiogram examinations.                 Arrythmias:PVC; Risk Factors:Hypertension, Current Smoker and                 Sleep Apnea.  Sonographer:    Johny Chess Referring Phys: [phone] [place] Comments: Image acquisition challenging due to uncooperative patient and Image acquisition challenging due to respiratory motion. IMPRESSIONS  1. Left ventricular ejection fraction, by estimation, is 50 to 55%. The left ventricle has low normal function. The left ventricle has no regional wall motion abnormalities. Left ventricular diastolic parameters are consistent with Grade II diastolic dysfunction (pseudonormalization).  2. Right ventricular systolic function is normal. The right ventricular size is normal. Tricuspid regurgitation signal is inadequate for assessing PA pressure.  3. Left atrial size was moderately dilated.  4. The mitral valve is normal in structure. Mild mitral valve regurgitation. No evidence of mitral stenosis.  5. The aortic valve is grossly normal. Aortic valve regurgitation is not visualized. Mild aortic valve sclerosis is present, with no evidence of aortic valve stenosis.  6. The inferior vena cava is dilated in  size with >50% respiratory variability, suggesting right atrial pressure of 8 mmHg. Comparison(s): No prior Echocardiogram. FINDINGS  Left Ventricle: Left ventricular ejection fraction, by estimation, is 50 to 55%. The left ventricle has low normal function. The left ventricle has no regional wall motion abnormalities. The left ventricular internal cavity size was normal in size. There is borderline left ventricular hypertrophy. Left ventricular diastolic parameters are consistent with Grade II diastolic dysfunction (pseudonormalization). Right Ventricle: The right ventricular size is normal. No increase in right ventricular wall thickness. Right ventricular systolic function is normal. Tricuspid regurgitation signal is inadequate for assessing PA pressure. Left Atrium: Left atrial size was moderately dilated. Right Atrium: Right atrial size was normal in size. Pericardium: Trivial pericardial effusion is present. Mitral Valve: The mitral valve is normal in structure. Mild mitral valve regurgitation. No evidence of mitral valve stenosis. Tricuspid Valve: The tricuspid valve is normal in structure. Tricuspid valve regurgitation is trivial. Aortic Valve: The aortic valve is grossly normal. Aortic valve regurgitation is not visualized. Mild aortic valve sclerosis is present, with no evidence of aortic valve stenosis. There is mild calcification of the aortic valve. Pulmonic Valve: The pulmonic valve was not well visualized. Pulmonic valve regurgitation is trivial. No evidence of pulmonic stenosis. Aorta: The aortic root, ascending aorta and aortic arch are all structurally normal, with no evidence of dilitation or obstruction. Venous: The inferior vena cava is dilated in size with greater than 50% respiratory variability, suggesting right atrial pressure of 8 mmHg. IAS/Shunts: The atrial septum is grossly normal.  LEFT VENTRICLE PLAX 2D LVIDd:         5.00 cm  Diastology LVIDs:         3.70 cm  LV e' lateral:   9.00  cm/s LV PW:         1.20 cm  LV E/e' lateral: 13.8 LV IVS:        1.10 cm  LV e' medial:    5.00 cm/s LVOT diam:     2.10 cm  LV E/e' medial:  24.8 LV SV:         56 LV SV Index:   26 LVOT Area:     3.46 cm  RIGHT VENTRICLE             IVC RV S prime:     17.00 cm/s  IVC diam: 2.50 cm TAPSE (M-mode): 1.9 cm LEFT ATRIUM             Index       RIGHT ATRIUM           Index LA diam:        4.40 cm 2.05 cm/m  RA Area:     18.60 cm LA Vol (A2C):   81.8 ml 38.09 ml/m RA Volume:   51.80 ml  24.12 ml/m LA Vol (A4C):   87.3 ml 40.65 ml/m LA Biplane Vol: 86.7 ml 40.37 ml/m  AORTIC VALVE LVOT Vmax:   97.40 cm/s LVOT Vmean:  70.400 cm/s LVOT VTI:    0.163 m  AORTA Ao Root diam: 3.30 cm Ao Asc diam:  3.40 cm MITRAL VALVE MV Area (PHT): 3.65 cm     SHUNTS MV Decel Time: 208 msec     Systemic VTI:  0.16 m MV E velocity: 124.00 cm/s  Systemic Diam: 2.10 cm MV A velocity: 94.10 cm/s MV E/A ratio:  1.32 Buford Dresser MD Electronically signed by Buford Dresser MD Signature  Date/Time: 04/06/2020/6:38:24 PM    Final    IR PERCUTANEOUS ART THROMBECTOMY/INFUSION INTRACRANIAL INC DIAG ANGIO  Result Date: 04/06/2020 INDICATION: 69 year old male with past medical history significant for tobacco use. He head sudden onset of right gaze deviation left-side paralysis. Systolic blood pressure was 260/149 mmHg upon arrival. His last known well was 10 a.m. on 04/05/2020. Premorbid Rankin scale was 0 and NIHSS at presentation was 19. IV tPA was started at 11:16 a.m. head CT showed no evidence of intracranial hemorrhage with remote strokes noted in the bilateral centrum semiovale, basal ganglia and right thalamus. CT angiogram of the head and neck showed for a proximal right M1/MCA occlusion. He was then transferred to our service for diagnostic cerebral angiogram and mechanical thrombectomy. EXAM: IR CT HEAD LIMITED; INTRACRANIAL STENT (INCL PTA); IR PERCUTANEOUS ART THORMBECTOMY/INFUSION INTRACRANIAL INCLUDE DIAG ANGIO  COMPARISON:  CT/CT angiogram of the head and neck 04/05/2020. MEDICATIONS: IV bolus fallowed by infusion of cangrelor. ANESTHESIA/SEDATION: The procedure was performed under general anesthesia. FLUOROSCOPY TIME:  Fluoroscopy Time: 29 minutes 18 seconds (1,626 mGy). COMPLICATIONS: None immediate. TECHNIQUE: Unable to obtain informed consent as patient could not consent for himself and no next of kin or health care proxy was available. Case discussed with neuro hospitalist team and decision made that proceed with intervention was in the best interest of the patient. Maximal Sterile Barrier Technique was utilized including caps, mask, sterile gowns, sterile gloves, sterile drape, hand hygiene and skin antiseptic. A timeout was performed prior to the initiation of the procedure. The right groin was prepped and draped in the usual sterile fashion. Using a micropuncture kit and the modified Seldinger technique, access was gained to the right common femoral artery and an 8 French sheath was placed. Under fluoroscopy, an 8 [place] Walrus balloon guide catheter was navigated over a 6 [place] Berenstein 2 catheter and a 0.035" Terumo Glidewire into the aortic arch. The catheter was placed into the right common carotid artery and then advanced into the left internal carotid artery. The inner catheter was removed. Frontal and lateral angiograms of the head were obtained. FINDINGS: 1. Increased tortuosity of the cervical right ICA. Luminal irregularity along the petrous and cavernous segment of the right ICA, consistent with atherosclerotic disease with focal area of mild stenosis at the petrous segment. 2. 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 3. Opacification of the right ACA, left anterior and posterior circulation related to prominent right posterior communicating artery and anterior communicating artery. PROCEDURE: Under biplane roadmap, a Zoom 71 aspiration catheter was navigated over a phenom 21  microcatheter and a synchro support microguidewire into the cavernous segment of the right ICA. The microcatheter was removed. The aspiration catheter was advanced to the level of occlusion and connected to a penumbra aspiration pump. Continuous aspiration was performed for maintenance. The guiding catheter balloon was inflated. The aspiration catheter was removed under constant aspiration. Follow-up right ICA angiogram showed persistent right M1 occlusion. Under biplane roadmap, a Zoom 71 aspiration catheter was navigated over a phenom 21 microcatheter and a synchro support microguidewire into the cavernous segment of the right ICA. The microcatheter was then navigated over the wire into the right M2/MCA superior division branch. Then, a 6 x 40 mm solitaire stent retriever was deployed spanning the right M1 and M2 segments. The device was allowed to intercalated with the clot for 4 minutes. The microcatheter was removed. The aspiration catheter was advanced to the level of occlusion and connected to a penumbra  aspiration pump. The guiding catheter balloon was inflated. The thrombectomy device and aspiration catheter were removed under constant aspiration. Follow-up right ICA angiogram showed complete recanalization of the right MCA territory (TICI 3). Atherosclerotic stenosis at the mid M1 segment was noted. Follow-up angiogram showed slow flow in the right MCA territory. Flat panel CT of the head was obtained and post processed in a separate workstation with concurrent attending physician supervision. Selected images were sent to PACS. No evidence of hemorrhage is noted. Right ICA angiograms with frontal and lateral views of the head showed worsening delay of filling of the right MCA vascular tree with severe stenosis at the M1 segment. Using biplane roadmap, a 2.5 mm x 9 mm Gateway balloon was navigated into the right M1 segment over a Zoom 14 wire. Angioplasty performed under fluoroscopy. Follow-up right ICA  angiogram showed significant improvement with brisk filling of the right MCA territory. Delayed follow-up showed restenoses of the M1 segment with delayed flow in the MCA territory. Patient received IV bolus of cangrelor followed by low-dose infusion. A 2.5 mm x 15 mm resolute the chronic stent was navigated into the right MCA and deployed across the stenotic M1 segment. Follow-up angiogram showed resolution of the M1 stenosis with brisk anterograde flow in the right MCA territory. Delayed follow-up angiogram showed persistent patency of the stent with no evidence of in stent platelet aggregation. No evidence of thromboembolic complication. The catheter was subsequently withdrawn. Flat panel CT of the head was obtained and post processed in a separate workstation with concurrent attending physician supervision. Selected images were sent to PACS. No evidence of hemorrhage noted. Right common femoral artery angiograms were performed with right anterior oblique and lateral views via sheath side or contrast injection. Atherosclerotic changes of the external iliac and common femoral artery are noted without hemodynamically significant stenosis. The sheath was exchanged over the wire for an 8 [place] changes seen which was utilized for access closure. Immediate hemostasis was achieved. Incidentally noted is left-sided hydronephrosis. IMPRESSION: 1. Mechanical thrombectomy performed with 2 passes (1 pass aspiration and 1 pass combined technique) with complete recanalization of the right MCA vascular tree (TICI3). 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 2. Angioplasty performed at the stenosis M1 segment with improvement of the flow. Re-stenosis noted on follow-up angiogram. 3. Stenting of the right M1 segment performed with resolution of the M1 segment stenosis and brisk anterograde flow in the right MCA vascular tree. PLAN: 1. Continued  cangrelor infusion until head CT is obtained at 4 hours post procedure. 2. Loading of dual anti-platelet in case no hemorrhage seen on follow-up head followed by dual anti-platelet therapy for 6 months. 3. Bed rest post femoral access + tPA and cangrelor. Electronically Signed   By: Pedro Earls M.D.   On: 04/06/2020 16:10   CT HEAD CODE STROKE WO CONTRAST  Addendum Date: 04/05/2020   ADDENDUM REPORT: 04/05/2020 11:14 ADDENDUM: These results were called by telephone at the time of interpretation on 04/05/2020 at 11:14 am to provider Dr. Rory Percy, who verbally acknowledged these results. Electronically Signed   By: Kellie Simmering DO   On: 04/05/2020 11:14   Result Date: 04/05/2020 CLINICAL DATA:  Code stroke. Neuro deficit, acute, stroke suspected. EXAM: CT HEAD WITHOUT CONTRAST TECHNIQUE: Contiguous axial images were obtained from the base of the skull through the vertex without intravenous contrast. COMPARISON:  No pertinent prior studies available for comparison. FINDINGS: Brain: The  examination is mildly motion degraded. Mild generalized parenchymal atrophy. There is no acute intracranial hemorrhage. No acute demarcated cortical infarct is identified. There are numerous small lacunar infarcts within the bilateral cerebral white matter, right thalamus and bilateral basal ganglia which appear chronic. Background mild patchy hypoattenuation within the cerebral white matter which is nonspecific, but consistent with chronic small vessel ischemic disease. No extra-axial fluid collection. No evidence of intracranial mass. No midline shift. Vascular: No hyperdense vessel.  Atherosclerotic calcifications Skull: Normal. Negative for fracture or focal lesion. Sinuses/Orbits: Visualized orbits show no acute finding. Small right maxillary sinus mucous retention cyst. Mild ethmoid sinus mucosal thickening. No significant mastoid effusion. ASPECTS [facility] Stroke Program Early CT Score) - Ganglionic level  infarction (caudate, lentiform nuclei, internal capsule, insula, M1-M3 cortex): 7 - Supraganglionic infarction (M4-M6 cortex): 3 Total score (0-10 with 10 being normal): 10 IMPRESSION: 1. Mildly motion degraded examination. 2. No CT evidence of acute infarct or acute intracranial hemorrhage. 3. Numerous small chronic appearing lacunar infarcts within the cerebral white matter, bilateral basal ganglia and right thalamus. Background mild chronic small vessel ischemic changes within the cerebral white matter. 4. Mild generalized parenchymal atrophy. 5. Mild ethmoid sinus mucosal thickening. Small right maxillary sinus mucous retention cyst. Electronically Signed: By: Kellie Simmering DO On: 04/05/2020 11:11   CT ANGIO HEAD CODE STROKE  Result Date: 04/05/2020 CLINICAL DATA:  Stroke, follow-up. EXAM: CT ANGIOGRAPHY HEAD AND NECK TECHNIQUE: Multidetector CT imaging of the head and neck was performed using the standard protocol during bolus administration of intravenous contrast. Multiplanar CT image reconstructions and MIPs were obtained to evaluate the vascular anatomy. Carotid stenosis measurements (when applicable) are obtained utilizing NASCET criteria, using the distal internal carotid diameter as the denominator. CONTRAST:  Administered contrast not known at this time. COMPARISON:  Concurrently performed noncontrast head CT. FINDINGS: CTA NECK FINDINGS Aortic arch: Standard aortic branching. Atherosclerotic plaque within the visualized aortic arch and proximal major branch vessels of the neck. No hemodynamically significant innominate or proximal subclavian artery stenosis. Right carotid system: CCA and ICA patent within the neck without significant stenosis (50% or greater). Moderate mixed plaque within the carotid bifurcation and proximal ICA. Mild to moderate mixed plaque is also present within the distal cervical ICA Left carotid system: CCA and ICA patent within the neck. Moderate mixed plaque within the  carotid bifurcation and proximal ICA. Narrowing of the proximal ICA of 40-50% Vertebral arteries: The dominant right vertebral artery is patent within the neck without significant stenosis (50% or greater). The non dominant left vertebral artery is patent within the neck. Moderate atherosclerotic narrowing at the origin of this vessel Skeleton: No acute bony abnormality or aggressive osseous lesion. Cervical spondylosis with multilevel disc space narrowing, posterior disc osteophytes, uncovertebral and facet hypertrophy. Spondylosis is greatest at C5-C6 and C6-C7 Other neck: Thyroid unremarkable. Nonspecific subcutaneous cystic appearing lesion within the right mid neck measuring 10 mm (series 100, image 411). Upper chest: No consolidation within the imaged lung apices Review of the MIP images confirms the above findings CTA HEAD FINDINGS Anterior circulation: The intracranial internal carotid arteries are patent. Atherosclerotic plaque within both vessels. Mild to moderate narrowing of the pre cavernous right ICA. 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 Some reconstitution of flow is seen within M2 and more distal right MCA branch vessels, although asymmetrically decreased as compared to the left. The M1 left  middle cerebral artery is patent. No left M2 proximal branch occlusion or high-grade proximal stenosis is identified. The anterior cerebral arteries are patent. No intracranial aneurysm is identified. Posterior circulation: The intracranial vertebral arteries are patent without significant stenosis. Mild nonstenotic plaque within the V4 right vertebral artery. The basilar artery is patent without significant stenosis. The posterior cerebral arteries are patent. There is a sizable right posterior communicating artery. The left posterior communicating artery is hypoplastic or absent.  Moderate stenosis within the proximal P2 right PCA. Moderate stenosis within the mid P2 left PCA. Venous sinuses: Within limitations of contrast timing, no convincing thrombus. 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 These results were called by telephone at the time of interpretation on 04/05/2020 at 11:14 am to provider Dr. Rory Percy, who verbally acknowledged these results. IMPRESSION: CTA neck: 1. The left common and internal carotid arteries are patent within the neck. 40-50% atherosclerotic narrowing of the proximal left ICA. 2. The right common and internal carotid arteries are patent within the neck without significant stenosis. Moderate mixed plaque within the right carotid bifurcation and proximal ICA. Mild to moderate mixed plaque within the distal cervical ICA. 3. The vertebral arteries are patent within the neck bilaterally with the right being dominant. Moderate stenosis at the origin of the non dominant left vertebral artery. CTA head: 1. 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 There is some reconstitution of flow within M2 and more distal right MCA branch vessels, although asymmetrically decreased as compared to the left. 2. Sites of up to moderate stenosis within both intracranial internal carotid arteries. 3. Moderate stenoses within the P2 posterior cerebral arteries bilaterally. Electronically Signed   By: Kellie Simmering DO   On: 04/05/2020 11:44   CT ANGIO NECK CODE STROKE  Result Date: 04/05/2020 CLINICAL DATA:  Stroke, follow-up. EXAM: CT ANGIOGRAPHY HEAD AND NECK TECHNIQUE: Multidetector CT imaging of the head and neck was performed using the standard protocol during bolus administration of intravenous contrast. Multiplanar CT image reconstructions and MIPs were obtained to evaluate the vascular anatomy. Carotid stenosis measurements (when applicable) are obtained  utilizing NASCET criteria, using the distal internal carotid diameter as the denominator. CONTRAST:  Administered contrast not known at this time. COMPARISON:  Concurrently performed noncontrast head CT. FINDINGS: CTA NECK FINDINGS Aortic arch: Standard aortic branching. Atherosclerotic plaque within the visualized aortic arch and proximal major branch vessels of the neck. No hemodynamically significant innominate or proximal subclavian artery stenosis. Right carotid system: CCA and ICA patent within the neck without significant stenosis (50% or greater). Moderate mixed plaque within the carotid bifurcation and proximal ICA. Mild to moderate mixed plaque is also present within the distal cervical ICA Left carotid system: CCA and ICA patent within the neck. Moderate mixed plaque within the carotid bifurcation and proximal ICA. Narrowing of the proximal ICA of 40-50% Vertebral arteries: The dominant right vertebral artery is patent within the neck without significant stenosis (50% or greater). The non dominant left vertebral artery is patent within the neck. Moderate atherosclerotic narrowing at the origin of this vessel Skeleton: No acute bony abnormality or aggressive osseous lesion. Cervical spondylosis with multilevel disc space narrowing, posterior disc osteophytes, uncovertebral and facet hypertrophy. Spondylosis is greatest at C5-C6 and C6-C7 Other neck: Thyroid unremarkable. Nonspecific subcutaneous cystic appearing lesion within the right mid neck measuring 10 mm (series 100, image 411). Upper chest: No consolidation within the  imaged lung apices Review of the MIP images confirms the above findings CTA HEAD FINDINGS Anterior circulation: The intracranial internal carotid arteries are patent. Atherosclerotic plaque within both vessels. Mild to moderate narrowing of the pre cavernous right ICA. Sites of up to moderate stenosis within the precavernous, cavernous and paraclinoid left ICA There is abrupt occlusion  of the proximal M1 right middle cerebral artery. Some reconstitution of flow is seen within M2 and more distal right MCA branch vessels, although asymmetrically decreased as compared to the left. The M1 left middle cerebral artery is patent. No left M2 proximal branch occlusion or high-grade proximal stenosis is identified. The anterior cerebral arteries are patent. No intracranial aneurysm is identified. Posterior circulation: The intracranial vertebral arteries are patent without significant stenosis. Mild nonstenotic plaque within the V4 right vertebral artery. The basilar artery is patent without significant stenosis. The posterior cerebral arteries are patent. There is a sizable right posterior communicating artery. The left posterior communicating artery is hypoplastic or absent. Moderate stenosis within the proximal P2 right PCA. Moderate stenosis within the mid P2 left PCA. Venous sinuses: Within limitations of contrast timing, no convincing thrombus. 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 These results were called by telephone at the time of interpretation on 04/05/2020 at 11:14 am to provider Dr. Rory Percy, who verbally acknowledged these results. IMPRESSION: CTA neck: 1. The left common and internal carotid arteries are patent within the neck. 40-50% atherosclerotic narrowing of the proximal left ICA. 2. The right common and internal carotid arteries are patent within the neck without significant stenosis. Moderate mixed plaque within the right carotid bifurcation and proximal ICA. Mild to moderate mixed plaque within the distal cervical ICA. 3. The vertebral arteries are patent within the neck bilaterally with the right being dominant. Moderate stenosis at the origin of the non dominant left vertebral artery. CTA head: 1. 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 There is some  reconstitution of flow within M2 and more distal right MCA branch vessels, although asymmetrically decreased as compared to the left. 2. Sites of up to moderate stenosis within both intracranial internal carotid arteries. 3. Moderate stenoses within the P2 posterior cerebral arteries bilaterally. Electronically Signed   By: Kellie Simmering DO   On: 04/05/2020 11:44      HISTORY OF PRESENT ILLNESS Clemmie Marxen is a 69 y.o. male tobacco abuse.  Patient was last known well at 10 AM this morning 04/05/2020.  Apparently he had gotten out of his truck started walking and had a fall.  Patient's coworker came up to him and noticed that he had a right gaze deviation, was not moving his left side, had a left facial droop and was having periods of apnea.  EMS was called immediately.  Patient remained the same on arrival.  They noticed his blood pressure was 260/140.  Patient was immediately transferred to [facility] as code stroke.  On arrival patient had a right gaze deviation, left facial droop, severe dysarthria, hemiplegic on the left.  Patient was immediately brought back to CT which showed no intracranial hemorrhage CTA head and neck were obtained.  CTA of head showed a proximal right M1 occlusion.  Patient at that point was given TPA and transferred to interventional radiology for further evaluation and clot removal. Premorbid modified Rankin scale (mRS): 0. NIH stroke score: [place] Mr. Evaristo Tsuda  is a 69 y.o. male with history of tobacco use presenting with L sided flaccidity, R gaze, L facial droop, periods of apnea in setting of BP 260/140. IV tPA administered 04/05/2020 at 1116. 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 Numerous chronic lacunes B basal ganglia, R thalamic, white matter.  ASPECTS 10.     CTA head R M1 occlusion. Intracranial B ICA moderate stenosis. B P2 w/ moderate stenoses.  CTA neck proximal L ICA 40-50% stenosis. R ICA bifurcation & proximal w/ mixed plaque. Mild to moderate distal cervical R ICA. L VA origin moderate stenosis.  Cerebral angio R M1 occlusion s/p EVT and R M1 stent placed w/ complete revascularization     CT head 7/12 1715 developing R MCA infarct. Mild hyperdensity R putamen, ? Contrast vs hemorrhage.   MRI  Evolving R large MCA infarct (basal ganglia, insula, temporal, frontal lobes). Minimal petechial hemorrhage.   MRA  interval R M1 stent patent  CT repeat 7/15 stable extensive R MCA infarct w/ regional mass effect and partial effacement R temporal horn, mild petechial hemorrhage. R M1 MCA stent, asymmetric hyperdense R M2.  CT and CTA 04/10/20 - Subacute infarct right MCA territory with decreased cytotoxic edema. No hemorrhage or new infarct. 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 Right MCA stent is patent with good flow to right MCA branches. Moderate stenosis left supraclinoid internal carotid artery due to calcific stenosis. Left middle cerebral artery widely patent. Anterior cerebral arteries patent bilaterally. Severe stenosis left P2 segment. Mild stenosis right posterior cerebral artery.  2D Echo EF 50-55%  TG 550->261, direct LDL 108.2   HgbA1c 13.5  No antithrombotic prior to admission, treated w/ cangrelor post IR, loaded w/ Brilinta 180 and aspirin post op, was on  aspirin 81 mg daily and Brilinta (ticagrelor) 90 mg bid. Brilinta now on hold given allergic rash. Given possible allergy to Brilinta and new stent placement, will add plavix to aspirn ASA. Will need to continue at least 6 months post stent placement.   Therapy recommendations:  SNF  Disposition:  SNF   COVID testing for SNF placement NEGATIVE. [employer] vaccine given during  hospitalization (had received 1 dose of Pfizer prior to admission)   Cerebral edema, resolving  CT 7/15 showed large right MCA infarct with regional mass-effect  No significant midline shift yet  Repeat CTA head 7/17 -> decreasing edema  Clinically stable   Hypertensive Emergency Hypertension  BP as high as 260/140 on admission   Home meds:  None  Treated w max Cleviprex post IR for Post IR tight BP control -> now off   On lisinopril 20 -> d/c due to elevated Cre  On amlodipine 10-> d/c due to potential related rash   Now on labetalol  BP stable  Long-term BP goal normotensive   Hyperlipidemia  Home meds:  None  Now on lipitor 80  TG 550->261, direct LDL 108.2, goal < 70  Continue statin at discharge   Diabetes type II Uncontrolled w/ Hyperglycemia  Home meds:  none  HgbA1c 13.5, goal < 7.0  CBGs  DB RN consulted  On Novolog 4u q4h while on TF ->d/c'd when TF stopped  On Levemir 8 bid  Dysphagia  Secondary to stroke  Speech on board  On cortrak and TF @ 72 and IVF -> d/c  Sister agreeable to PEG for placement if needed, Trauma consulted.   Passed MBSS for D1 honey thick liquids  Pt eating well so far   AKI  Cre 1.28-1.26-1.15-1.19->1.24->1.32->1.52->1.25  Treated w/ IVF  D/c lisinopril  BMP monitoring    Tobacco abuse  Current smoker  Smoking cessation counseling provided  Nicotine patch 14 mg daily added  Pt is willing to quit     Alcohol abuse  Heavy user as per sister  alcohol level <10  CIWA protocol, now off  advised to drink no more than 2 drink(s) a day   Cocaine abuse  Pt admitted using cocaine  Cessation education provided  Pt is willing to quit  Rash, improving   truncal area, back > front, pruritus   Benadryl Q 6 hrs prn itching  Brilinta and Amlodipine d/c'd   Improved   Other Stroke Risk Factors  Advanced age  Obesity, Body mass index is 32.26 kg/m., recommend weight loss, diet and  exercise as appropriate    Other Active Problems  Hypokalemia - resolved   Hypomagnesemia - resolved   DISCHARGE EXAM Blood pressure (!) (P) 145/96, pulse (P) 98, temperature 98.1 F (36.7 C), temperature source Axillary, resp. rate (P) 16, height '5\' 9"'  (1.753 m), weight 99.1 kg, SpO2 (P) 99 %.   General - Well nourished, well developed middle-aged Caucasian male, awake and interactive  Ophthalmologic - fundi not visualized due to noncooperation.  Cardiovascular - Regular rhythm and rate.  Neuro - awake and alert, mild dysarthria, oriented to place and people, not to time, able to follow simple commands. Right gaze preference but able to left gaze but incomplete, left hemianopia. Left facial droop. Tongue midline. Left UE flaccid. Left LE 2-/5 proximal and 0/5 distal. RUE 5/5 and RLE 4/5  Left babinski positive. Sensation, coordination not cooperative and gait not tested.   Discharge Diet   Dysphagia 1 honey thick liquids  DISCHARGE PLAN  Disposition:  Skilled nursing facility for ongoing PT, OT and ST.   aspirin 81 mg daily and clopidogrel 75 mg daily for secondary stroke prevention, DAPT for at least 1 year given stent placement  Ongoing stroke risk factor control by Primary Care Physician at time of discharge  Follow-up PCP or MD at SNF within 2 weeks.  Follow-up in [facility] in 4 weeks, office to schedule an appointment.   Follow-up Katyucia de Sindy Messing MD (Interventional Neuroradiologist) in 4 weeks, office to schedule an appointment.   35 minutes were spent preparing discharge.  Rosalin Hawking, MD PhD Stroke Neurology 04/19/2020 4:17 PM

## 2020-04-16 NOTE — Progress Notes (Signed)
Called regarding pruritic rash. Rash is diffuse macular, most prominent on chest and back. He states that it started while still in the ICU. I suspect drug reaction.  New medicines include lisinopril, atorvastatin, protonix, brilinta. Will place pharmacy consult for which is most likely culprit and give IV benadryl 25mg .   Roland Rack, MD [facility] [phone]  If 7pm- 7am, please page neurology on call as listed in [place].

## 2020-04-16 NOTE — Plan of Care (Signed)
  Problem: Education: Goal: Knowledge of secondary prevention will improve Outcome: Adequate for Discharge   Problem: Education: Goal: Knowledge of patient specific risk factors addressed and post discharge goals established will improve Outcome: Adequate for Discharge   Problem: Education: Goal: Knowledge of disease or condition will improve Outcome: Adequate for Discharge Goal: Knowledge of secondary prevention will improve Outcome: Adequate for Discharge Goal: Knowledge of patient specific risk factors addressed and post discharge goals established will improve Outcome: Adequate for Discharge Goal: Individualized Educational Video(s) Outcome: Progressing   Problem: Coping: Goal: Will verbalize positive feelings about self Outcome: Progressing Goal: Will identify appropriate support needs Outcome: Progressing

## 2020-04-16 NOTE — TOC Progression Note (Signed)
Transition of Care [facility]) - Progression Note    Patient Details  Name: Rodney Estrada MRN: [id number] Date of Birth: July 14, 1951  Transition of Care [facility]) CM/SW [place], [address] Phone Number: 04/16/2020, 11:00 AM  Clinical Narrative:   CSW updated by [facility] that insurance authorization has been received. Patient will owe the remaining portion of his deductible. CSW met with patient to discuss, and he says he can afford to pay what is owed. Patient has received one dose of the COVID vaccine, new COVID test will be needed. CSW asked MD to order. MD planning for DC tomorrow, wants to ensure the patient is eating enough and finish the calorie count before discharge. [facility] can accept tomorrow. CSW to follow.    Expected Discharge Plan: [place] Barriers to Discharge: Continued Medical Work up  Expected Discharge Plan and Services Expected Discharge Plan: [place] In-house Referral: Clinical Social Work Discharge Planning Services: CM Consult Post Acute Care Choice: [place] Living arrangements for the past 2 months: Apartment                                       Social Determinants of Health (SDOH) Interventions    Readmission Risk Interventions No flowsheet data found.

## 2020-04-16 NOTE — Progress Notes (Signed)
Notified MD that patient has rash. MD to treat and will continue to monitor.

## 2020-04-17 DIAGNOSIS — R1310 Dysphagia, unspecified: Secondary | ICD-10-CM

## 2020-04-17 DIAGNOSIS — E785 Hyperlipidemia, unspecified: Secondary | ICD-10-CM

## 2020-04-17 DIAGNOSIS — N179 Acute kidney failure, unspecified: Secondary | ICD-10-CM

## 2020-04-17 LAB — GLUCOSE, CAPILLARY
Glucose-Capillary: 111 mg/dL — ABNORMAL HIGH (ref 70–99)
Glucose-Capillary: 152 mg/dL — ABNORMAL HIGH (ref 70–99)
Glucose-Capillary: 194 mg/dL — ABNORMAL HIGH (ref 70–99)
Glucose-Capillary: 203 mg/dL — ABNORMAL HIGH (ref 70–99)
Glucose-Capillary: 151 mg/dL — ABNORMAL HIGH (ref 70–99)
Glucose-Capillary: 176 mg/dL — ABNORMAL HIGH (ref 70–99)

## 2020-04-17 LAB — BASIC METABOLIC PANEL
Anion gap: 15 (ref 5–15)
BUN: 27 mg/dL — ABNORMAL HIGH (ref 8–23)
CO2: 19 mmol/L — ABNORMAL LOW (ref 22–32)
Calcium: 9.8 mg/dL (ref 8.9–10.3)
Chloride: 109 mmol/L (ref 98–111)
Creatinine, Ser: 1.32 mg/dL — ABNORMAL HIGH (ref 0.61–1.24)
GFR calc Af Amer: >60 mL/min (ref >60)
GFR calc non Af Amer: 55 mL/min — ABNORMAL LOW (ref >60)
Glucose, Bld: 188 mg/dL — ABNORMAL HIGH (ref 70–99)
Potassium: 4.2 mmol/L (ref 3.5–5.1)
Sodium: 143 mmol/L (ref 135–145)

## 2020-04-17 LAB — CBC
HCT: 44.8 % (ref 39.0–52.0)
Hemoglobin: 14.7 g/dL (ref 13.0–17.0)
MCH: 28.8 pg (ref 26.0–34.0)
MCHC: 32.8 g/dL (ref 30.0–36.0)
MCV: 87.7 fL (ref 80.0–100.0)
Platelets: 336 10*3/uL (ref 150–400)
RBC: 5.11 MIL/uL (ref 4.22–5.81)
RDW: 13.2 % (ref 11.5–15.5)
WBC: 10.2 10*3/uL (ref 4.0–10.5)
nRBC: 0 % (ref 0.0–0.2)

## 2020-04-17 MED ORDER — DIPHENHYDRAMINE HCL 50 MG/ML IJ SOLN
25.0000 mg | Freq: Once | INTRAMUSCULAR | Status: AC
  Administered 2020-04-17: 25 mg via INTRAVENOUS
  Filled 2020-04-17: qty 1

## 2020-04-17 MED ORDER — SODIUM CHLORIDE 0.9 % IV SOLN: INTRAVENOUS | Status: DC

## 2020-04-17 NOTE — TOC Progression Note (Addendum)
Transition of Care [facility]) - Progression Note    Patient Details  Name: Rodney Estrada MRN: [id number] Date of Birth: [birth year]  Transition of Care [facility]) CM/SW [place], [place] Phone Number: 04/17/2020, 12:13 PM  Clinical Narrative:    1:05p CSW spoke with [facility] supervisor and confirmed patient is unable to get the second covid vaccine while inpatient. CSW provided patient and charge nurse with an update.  12:15p CSW contacted infectious disease department to inquire on patient getting second covid vaccine and informed they are unable to perform vaccine. CSW contacted [facility] supervisor who is assisting with the process. CSW will continue to follow.  Expected Discharge Plan: [place] Barriers to Discharge: Continued Medical Work up  Expected Discharge Plan and Services Expected Discharge Plan: [place] In-house Referral: Clinical Social Work Discharge Planning Services: CM Consult Post Acute Care Choice: [place] Living arrangements for the past 2 months: Apartment Expected Discharge Date: 04/16/20                                     Social Determinants of Health (SDOH) Interventions    Readmission Risk Interventions No flowsheet data found.

## 2020-04-17 NOTE — Progress Notes (Signed)
Pharmacy Note Asked to evaluate meds as cause for rash.  No obvious medications noted to cause rash.  Did have chlorhexidine on skin 7/20.  Could also be ASA or amlodipine. Less likely atorvastatin.  RN said mainly on back and some on chest.  Not sure where nicotine patch was placed but would likely be a more regional rash.   Thank you, Excell Seltzer, PharmD

## 2020-04-17 NOTE — Progress Notes (Signed)
STROKE TEAM PROGRESS NOTE   INTERVAL HISTORY No family at bedside. Pt lying in bed, stated that his skin itchiness is better but still not resolved yet. His brilinta has been discontinued. His amlodipine also discontinued. Now still on ASA. Cre elevated and Na 143 as well as urine concentrated, will initiate IV fluid. Encourage po intake.   Vitals:   04/17/20 0500 04/17/20 0600 04/17/20 0749 04/17/20 1304  BP:   (!) 157/87 (!) 152/96  Pulse:      Resp: 15 (!) 11 (!) 11 16  Temp:   98.4 F (36.9 C) 98.9 F (37.2 C)  TempSrc:   Oral Oral  SpO2:   97% 97%  Weight:      Height:       CBC:  Recent Labs  Lab 04/11/20 0435 04/13/20 0335 04/15/20 0500 04/17/20 1240  WBC 9.7   < > 9.7 10.2  NEUTROABS 6.4  --   --   --   HGB 13.6   < > 13.0 14.7  HCT 42.7   < > 39.7 44.8  MCV 91.4   < > 88.6 87.7  PLT 239   < > 314 336   < > = values in this interval not displayed.   Basic Metabolic Panel:  Recent Labs  Lab 04/16/20 1232 04/17/20 1240  NA 142 143  K 3.8 4.2  CL 105 109  CO2 24 19*  GLUCOSE 214* 188*  BUN 27* 27*  CREATININE 1.24 1.32*  CALCIUM 10.0 9.8    IMAGING past 24 hours No results found.   PHYSICAL EXAM        General - Well nourished, well developed middle-aged Caucasian male, awake and interactive  Ophthalmologic - fundi not visualized due to noncooperation.  Cardiovascular - Regular rhythm and rate.  Neuro - awake and alert, mild dysarthria, oriented to place and people, not to time, able to follow simple commands. Right gaze preference but able to left gaze but incomplete, left hemianopia. Left facial droop. Tongue midline. Left UE flaccid. Left LE 2-/5 proximal and 0/5 distal. RUE 5/5 and RLE 4/5  Left babinski positive. Sensation, coordination not cooperative and gait not tested.   ASSESSMENT/PLAN [name] is a 69 y.o. male with history of tobacco use presenting with L sided flaccidity, R gaze, L facial droop, periods of apnea in setting of BP  260/140. IV tPA administered 04/05/2020 at 1116. Taken to IR for R M1 occlusion.   Stroke: R MCA infarct s/p tPA and evt w/ complete revascularization of R M1 following angioplasty w/ stent placement, infarct secondary to large vessel disease  Code Stroke CT head No acute abnormality. Numerous chronic lacunes B basal ganglia, R thalamic, white matter. ASPECTS 10.     CTA head R M1 occlusion. Intracranial B ICA moderate stenosis. B P2 w/ moderate stenoses.  CTA neck proximal L ICA 40-50% stenosis. R ICA bifurcation & proximal w/ mixed plaque. Mild to moderate distal cervical R ICA. L VA origin moderate stenosis.  Cerebral angio R M1 occlusion s/p EVT and R M1 stent placed w/ complete revascularization     CT head 7/12 1715 developing R MCA infarct. Mild hyperdensity R putamen, ? Contrast vs hemorrhage.   MRI  Evolving R large MCA infarct (basal ganglia, insula, temporal, frontal lobes). Minimal petechial hemorrhage.   MRA  interval R M1 stent patent  CT repeat 7/15 stable extensive R MCA infarct w/ regional mass effect and partial effacement R temporal horn, mild petechial hemorrhage.  R M1 MCA stent, asymmetric hyperdense R M2.  CT and CTA 04/10/20 - Subacute infarct right MCA territory with decreased cytotoxic edema. No hemorrhage or new infarct. 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 Right MCA stent is patent with good flow to right MCA branches. Moderate stenosis left supraclinoid internal carotid artery due to calcific stenosis. Left middle cerebral artery widely patent. Anterior cerebral arteries patent bilaterally. Severe stenosis left P2 segment. Mild stenosis right posterior cerebral artery.  2D Echo EF 50-55%  TG 550->261, direct LDL 108.2   HgbA1c 13.5  VTE prophylaxis - lovenox  No antithrombotic prior to admission, treated w/ cangrelor post IR, loaded w/ Brilinta 180 and aspirin post op,  aspirin 81 mg daily and  Brilinta (ticagrelor) 90 mg bid. Brilinta on hold given allergic rash.   Therapy recommendations:  SNF  Disposition:  Pending  Cerebral edema, resolving  CT 7/15 showed large right MCA infarct with regional mass-effect  No significant midline shift yet  Repeat CTA head 7/17 -> decreasing edema  Clinically stable  Hypertensive Emergency  BP as high as 260/140 on admission   Home meds:  None  Treated w max Cleviprex post IR for Post IR tight BP control -> now off   SBP goal < 180/105  On lisinopril 20  BP stable . Long-term BP goal normotensive  Hyperlipidemia  Home meds:  None  Now on lipitor 80  TG 550->261, direct LDL 108.2, goal < 70  Continue statin at discharge  Diabetes type II Uncontrolled w/ Hyperglycemia  Home meds:  none  HgbA1c 13.5, goal < 7.0  CBGs  SSI 0-15 q4h  DB RN following  On Levemir 8u bid  Dysphagia . Secondary to stroke . Speech on board . On cortrak and TF @ 70 and IVF -> d/c . Sister agreeable to PEG for placement.  . Trauma consulted.  Kary Kos on hold for possible PEG . Passed MBSS today for D1 honey thick liquids . Pt agreeable to eat . Stop TF  . Calorie count  AKI   Cre 1.28-1.26-1.15-1.19->1.24->1.32  Add IVF  BMP monitoring   Tobacco abuse  Current smoker  Smoking cessation counseling will be provided  Nicotine patch 14 mg daily added  Pt is willing to quit    Alcohol abuse  Heavy user as per sister  alcohol level <10  CIWA protocol   advised to drink no more than 2 drink(s) a day  Close monitor for alcohol withdraw  Cocaine abuse  Pt admitted using cocaine  Cessation education provided  Pt is willing to quit  Avoid beta blocker at this time  Rash   Benadryl Q 6 hrs prn itching  Close monitoring  Other Stroke Risk Factors  Advanced age  Obesity, Body mass index is 32.26 kg/m., recommend weight loss, diet and exercise as appropriate   Other Active  Problems    Rosalin Hawking, MD PhD Stroke Neurology 04/18/2020 12:34 AM  Hospital day # 12     To contact Stroke Continuity provider, please refer to [email or web address] After hours, contact General Neurology

## 2020-04-17 NOTE — Plan of Care (Signed)
  Problem: Education: Goal: Knowledge of disease or condition will improve Outcome: Progressing Goal: Knowledge of secondary prevention will improve Outcome: Progressing Goal: Knowledge of patient specific risk factors addressed and post discharge goals established will improve Outcome: Progressing Goal: Individualized Educational Video(s) Outcome: Progressing   Problem: Coping: Goal: Will verbalize positive feelings about self Outcome: Progressing Goal: Will identify appropriate support needs Outcome: Progressing   Problem: Health Behavior/Discharge Planning: Goal: Ability to manage health-related needs will improve Outcome: Progressing   Problem: Self-Care: Goal: Ability to participate in self-care as condition permits will improve Outcome: Progressing Goal: Verbalization of feelings and concerns over difficulty with self-care will improve Outcome: Progressing Goal: Ability to communicate needs accurately will improve Outcome: Progressing   Problem: Nutrition: Goal: Risk of aspiration will decrease Outcome: Progressing Goal: Dietary intake will improve Outcome: Progressing   Problem: Self-Care: Goal: Ability to participate in self-care as condition permits will improve Outcome: Progressing Goal: Verbalization of feelings and concerns over difficulty with self-care will improve Outcome: Progressing Goal: Ability to communicate needs accurately will improve Outcome: Progressing   Problem: Nutrition: Goal: Risk of aspiration will decrease Outcome: Progressing Goal: Dietary intake will improve Outcome: Progressing   Problem: Ischemic Stroke/TIA Tissue Perfusion: Goal: Complications of ischemic stroke/TIA will be minimized Outcome: Progressing

## 2020-04-18 DIAGNOSIS — I69391 Dysphagia following cerebral infarction: Secondary | ICD-10-CM

## 2020-04-18 DIAGNOSIS — E876 Hypokalemia: Secondary | ICD-10-CM

## 2020-04-18 DIAGNOSIS — I10 Essential (primary) hypertension: Secondary | ICD-10-CM

## 2020-04-18 LAB — BASIC METABOLIC PANEL
Anion gap: 10 (ref 5–15)
BUN: 29 mg/dL — ABNORMAL HIGH (ref 8–23)
CO2: 25 mmol/L (ref 22–32)
Calcium: 9.5 mg/dL (ref 8.9–10.3)
Chloride: 109 mmol/L (ref 98–111)
Creatinine, Ser: 1.52 mg/dL — ABNORMAL HIGH (ref 0.61–1.24)
GFR calc Af Amer: 54 mL/min — ABNORMAL LOW (ref >60)
GFR calc non Af Amer: 47 mL/min — ABNORMAL LOW (ref >60)
Glucose, Bld: 147 mg/dL — ABNORMAL HIGH (ref 70–99)
Potassium: 3.3 mmol/L — ABNORMAL LOW (ref 3.5–5.1)
Sodium: 144 mmol/L (ref 135–145)

## 2020-04-18 LAB — CBC
HCT: 40.7 % (ref 39.0–52.0)
Hemoglobin: 12.8 g/dL — ABNORMAL LOW (ref 13.0–17.0)
MCH: 28.1 pg (ref 26.0–34.0)
MCHC: 31.4 g/dL (ref 30.0–36.0)
MCV: 89.5 fL (ref 80.0–100.0)
Platelets: 359 10*3/uL (ref 150–400)
RBC: 4.55 MIL/uL (ref 4.22–5.81)
RDW: 13.2 % (ref 11.5–15.5)
WBC: 9.7 10*3/uL (ref 4.0–10.5)
nRBC: 0 % (ref 0.0–0.2)

## 2020-04-18 LAB — GLUCOSE, CAPILLARY
Glucose-Capillary: 121 mg/dL — ABNORMAL HIGH (ref 70–99)
Glucose-Capillary: 227 mg/dL — ABNORMAL HIGH (ref 70–99)
Glucose-Capillary: 82 mg/dL (ref 70–99)
Glucose-Capillary: 182 mg/dL — ABNORMAL HIGH (ref 70–99)

## 2020-04-18 MED ORDER — INSULIN ASPART 100 UNIT/ML ~~LOC~~ SOLN
0.0000 [IU] | Freq: Three times a day (TID) | SUBCUTANEOUS | Status: DC
  Administered 2020-04-18: 2 [IU] via SUBCUTANEOUS
  Administered 2020-04-18: 5 [IU] via SUBCUTANEOUS
  Administered 2020-04-19 (×3): 2 [IU] via SUBCUTANEOUS

## 2020-04-18 MED ORDER — POTASSIUM CHLORIDE 20 MEQ PO PACK
20.0000 meq | PACK | Freq: Three times a day (TID) | ORAL | Status: AC
  Administered 2020-04-18 – 2020-04-19 (×3): 20 meq via ORAL
  Filled 2020-04-18 (×3): qty 1

## 2020-04-18 MED ORDER — AMLODIPINE BESYLATE 10 MG PO TABS: 10.0000 mg | ORAL_TABLET | Freq: Every day | ORAL | Status: DC

## 2020-04-18 MED ORDER — LABETALOL HCL 100 MG PO TABS
100.0000 mg | ORAL_TABLET | Freq: Three times a day (TID) | ORAL | Status: DC
  Administered 2020-04-18 – 2020-04-19 (×3): 100 mg via ORAL
  Filled 2020-04-18 (×3): qty 1

## 2020-04-18 NOTE — Progress Notes (Signed)
STROKE TEAM PROGRESS NOTE   INTERVAL HISTORY Pt lying in bed, no family at bedside. He is awake alert and orientated. Interactive. He ate well for the meal. However, Cre still high, urine is dark, will increase IVF.   Vitals:   04/18/20 0500 04/18/20 0705 04/18/20 0827 04/18/20 1301  BP:   (!) 171/112 (!) 151/92  Pulse:   73 77  Resp: 16 14 18 18   Temp:   98.4 F (36.9 C) (!) 97.5 F (36.4 C)  TempSrc:   Oral Oral  SpO2:    97%  Weight:      Height:       CBC:  Recent Labs  Lab 04/17/20 1240 04/18/20 0639  WBC 10.2 9.7  HGB 14.7 12.8*  HCT 44.8 40.7  MCV 87.7 89.5  PLT 336 831   Basic Metabolic Panel:  Recent Labs  Lab 04/17/20 1240 04/18/20 0639  NA 143 144  K 4.2 3.3*  CL 109 109  CO2 19* 25  GLUCOSE 188* 147*  BUN 27* 29*  CREATININE 1.32* 1.52*  CALCIUM 9.8 9.5    IMAGING past 24 hours No results found.   PHYSICAL EXAM        General - Well nourished, well developed middle-aged Caucasian male, awake and interactive  Ophthalmologic - fundi not visualized due to noncooperation.  Cardiovascular - Regular rhythm and rate.  Neuro - awake and alert, mild dysarthria, oriented to place and people, not to time, able to follow simple commands. Right gaze preference but able to left gaze but incomplete, left hemianopia. Left facial droop. Tongue midline. Left UE flaccid. Left LE 2-/5 proximal and 0/5 distal. RUE 5/5 and RLE 4/5  Left babinski positive. Sensation, coordination not cooperative and gait not tested.   ASSESSMENT/PLAN [name] is a 69 y.o. male with history of tobacco use presenting with L sided flaccidity, R gaze, L facial droop, periods of apnea in setting of BP 260/140. IV tPA administered 04/05/2020 at 1116. Taken to IR for R M1 occlusion ->stent  Stroke: R MCA infarct s/p tPA and evt w/ complete revascularization of R M1 following angioplasty w/ stent placement, infarct secondary to large vessel disease  Code Stroke CT head No acute  abnormality. Numerous chronic lacunes B basal ganglia, R thalamic, white matter. ASPECTS 10.     CTA head R M1 occlusion. Intracranial B ICA moderate stenosis. B P2 w/ moderate stenoses.  CTA neck proximal L ICA 40-50% stenosis. R ICA bifurcation & proximal w/ mixed plaque. Mild to moderate distal cervical R ICA. L VA origin moderate stenosis.  Cerebral angio R M1 occlusion s/p EVT and R M1 stent placed w/ complete revascularization     CT head 7/12 1715 developing R MCA infarct. Mild hyperdensity R putamen, ? Contrast vs hemorrhage.   MRI  Evolving R large MCA infarct (basal ganglia, insula, temporal, frontal lobes). Minimal petechial hemorrhage.   MRA  interval R M1 stent patent  CT repeat 7/15 stable extensive R MCA infarct w/ regional mass effect and partial effacement R temporal horn, mild petechial hemorrhage. R M1 MCA stent, asymmetric hyperdense R M2.  CT and CTA 04/10/20 - Subacute infarct right MCA territory with decreased cytotoxic edema. No hemorrhage or new infarct. 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 Right MCA stent is patent with good flow to right MCA branches. Moderate stenosis left supraclinoid internal carotid artery due to calcific stenosis. Left middle cerebral artery widely patent. Anterior cerebral arteries  patent bilaterally. Severe stenosis left P2 segment. Mild stenosis right posterior cerebral artery.  2D Echo EF 50-55%  TG 550->261, direct LDL 108.2   HgbA1c 13.5  VTE prophylaxis - lovenox  No antithrombotic prior to admission, treated w/ cangrelor post IR, loaded w/ Brilinta 180 and aspirin post op, was on  aspirin 81 mg daily and Brilinta (ticagrelor) 90 mg bid. Brilinta now on hold given allergic rash. On ASA   Therapy recommendations:  SNF  Disposition:  Pending  Cerebral edema, resolving  CT 7/15 showed large right MCA infarct with regional mass-effect  No significant midline shift  yet  Repeat CTA head 7/17 -> decreasing edema  Clinically stable  Hypertensive Emergency  BP as high as 260/140 on admission   Home meds:  None  SBP goal < 180/105  On lisinopril 20 -> d/c due to elevated Cre  On amlodipine 10-> d/c due to potential rash related  Now on labetalol  BP stable . Long-term BP goal normotensive  Hyperlipidemia  Home meds:  None  Now on lipitor 80  TG 550->261, direct LDL 108.2, goal < 70  Continue statin at discharge  Diabetes type II Uncontrolled w/ Hyperglycemia  Home meds:  none  HgbA1c 13.5, goal < 7.0  CBGs  SSI 0-15 q4h  DB RN following  On Levemir 8u bid  Dysphagia . Secondary to stroke . Speech on board . On cortrak and TF @ 70 and IVF -> d/c . Passed MBSS 04/13/20 -> D1 honey thick liquids . Pt eating well so far  AKI   Cre 1.28-1.26-1.15-1.19->1.24->1.32->1.52  IVF  NS at 50 cc /hr-> 75 ccs / hr  D/c lisinopril  Bladder scan with I&O PRN  BMP monitoring   Tobacco abuse  Current smoker  Smoking cessation counseling will be provided  Nicotine patch 14 mg daily added  Pt is willing to quit    Alcohol abuse  Heavy user as per sister  alcohol level <10  CIWA protocol   advised to drink no more than 2 drink(s) a day  Close monitor for alcohol withdraw  Cocaine abuse  Pt admitted using cocaine  Cessation education provided  Pt is willing to quit  Avoid beta blocker at this time  Rash   Benadryl Q 6 hrs prn itching  Close monitoring   Brilinta and Amlodipine d/c'd   Improved  Other Stroke Risk Factors  Advanced age  Obesity, Body mass index is 32.26 kg/m., recommend weight loss, diet and exercise as appropriate   Other Active Problems  Hypokalemia - 3.3 - replete - recheck  Hospital day # 13   Rosalin Hawking, MD PhD Stroke Neurology 04/18/2020 8:30 PM   To contact Stroke Continuity provider, please refer to [email or web address] After hours, contact General Neurology

## 2020-04-19 ENCOUNTER — Inpatient Hospital Stay: Payer: 59

## 2020-04-19 DIAGNOSIS — Z23 Encounter for immunization: Secondary | ICD-10-CM

## 2020-04-19 DIAGNOSIS — I63512 Cerebral infarction due to unspecified occlusion or stenosis of left middle cerebral artery: Secondary | ICD-10-CM

## 2020-04-19 LAB — CBC
HCT: 34.9 % — ABNORMAL LOW (ref 39.0–52.0)
Hemoglobin: 11.2 g/dL — ABNORMAL LOW (ref 13.0–17.0)
MCH: 28.6 pg (ref 26.0–34.0)
MCHC: 32.1 g/dL (ref 30.0–36.0)
MCV: 89.3 fL (ref 80.0–100.0)
Platelets: 316 10*3/uL (ref 150–400)
RBC: 3.91 MIL/uL — ABNORMAL LOW (ref 4.22–5.81)
RDW: 13.2 % (ref 11.5–15.5)
WBC: 8.6 10*3/uL (ref 4.0–10.5)
nRBC: 0 % (ref 0.0–0.2)

## 2020-04-19 LAB — BASIC METABOLIC PANEL
Anion gap: 9 (ref 5–15)
BUN: 19 mg/dL (ref 8–23)
CO2: 22 mmol/L (ref 22–32)
Calcium: 8.7 mg/dL — ABNORMAL LOW (ref 8.9–10.3)
Chloride: 115 mmol/L — ABNORMAL HIGH (ref 98–111)
Creatinine, Ser: 1.25 mg/dL — ABNORMAL HIGH (ref 0.61–1.24)
GFR calc Af Amer: >60 mL/min (ref >60)
GFR calc non Af Amer: 59 mL/min — ABNORMAL LOW (ref >60)
Glucose, Bld: 141 mg/dL — ABNORMAL HIGH (ref 70–99)
Potassium: 3.6 mmol/L (ref 3.5–5.1)
Sodium: 146 mmol/L — ABNORMAL HIGH (ref 135–145)

## 2020-04-19 LAB — GLUCOSE, CAPILLARY
Glucose-Capillary: 133 mg/dL — ABNORMAL HIGH (ref 70–99)
Glucose-Capillary: 213 mg/dL — ABNORMAL HIGH (ref 70–99)
Glucose-Capillary: 146 mg/dL — ABNORMAL HIGH (ref 70–99)

## 2020-04-19 MED ORDER — CLOPIDOGREL BISULFATE 75 MG PO TABS: 75.0000 mg | ORAL_TABLET | Freq: Every day | ORAL | Status: DC

## 2020-04-19 MED ORDER — CLOPIDOGREL BISULFATE 75 MG PO TABS
75.0000 mg | ORAL_TABLET | Freq: Every day | ORAL | Status: DC
  Administered 2020-04-19: 75 mg via ORAL
  Filled 2020-04-19: qty 1

## 2020-04-19 MED ORDER — LABETALOL HCL 100 MG PO TABS: 100.0000 mg | ORAL_TABLET | Freq: Three times a day (TID) | ORAL | Status: DC

## 2020-04-19 NOTE — TOC Transition Note (Signed)
Transition of Care [facility]) - CM/SW Discharge Note   Patient Details  Name: Rodney Estrada MRN: [id number] Date of Birth: 1951-03-10  Transition of Care [facility]) CM/SW Contact:  Pollie Friar, RN Phone Number: 04/19/2020, 4:30 PM   Clinical Narrative:    Pt discharging to [facility] today. Sister Jacqlyn Larsen notified and in agreement. Pt will transport via [facility]. Bedside RN updated and d/c packet at the desk.   RM: [place] Number for report: [phone]   Final next level of care: Skilled Nursing Facility Barriers to Discharge: No Barriers Identified   Patient Goals and CMS Choice   CMS Medicare.gov Compare Post Acute Care list provided to:: Patient Choice offered to / list presented to : Sibling  Discharge Placement              Patient chooses bed at: [facility] [place] Patient to be transferred to facility by: [place] Name of family member notified: Becky--sister Patient and family notified of of transfer: 04/19/20  Discharge Plan and Services In-house Referral: Clinical Social Work Discharge Planning Services: [employer] Consult Post Acute Care Choice: [place]                               Social Determinants of Health (SDOH) Interventions     Readmission Risk Interventions No flowsheet data found.

## 2020-04-19 NOTE — Progress Notes (Signed)
Telephone report called to [facility] and given to [place]. Pt is aware of transfer

## 2020-04-19 NOTE — Progress Notes (Signed)
  Speech Language Pathology Treatment: Dysphagia  [name] MRN: [id number] DOB: 1951-03-05 Today's Date: 04/19/2020 Time: 2122-4825 SLP Time Calculation (min) (ACUTE ONLY): 15 min  Assessment / Plan / Recommendation Clinical Impression  Pt was seen at bedside for assessment of diet tolerance and continued education. Nursing reports pt tolerates current diet without overt s/s aspiration. Pt accepted trials of puree and honey thick liquid, and did not exhibit overt s/s aspiration. No anterior leakage noted today. Pt reports poor appetite.    HPI HPI: Patient is a 69 y/o male who presents with acute CVA s/p cerebral arteriogram with emergent mechanical thrombectomy of right MCA M1 occlusion along with stent assisted angioplasty of right MCA M1 04/05/20. s/p tpA. + cocaine. 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 No PMH.      SLP Plan  Continue with current plan of care       Recommendations  Diet recommendations: Dysphagia 1 (puree);Honey-thick liquid Liquids provided via: Cup;No straw Medication Administration: Whole meds with puree Supervision: Full supervision/cueing for compensatory strategies;Staff to assist with self feeding Compensations: Minimize environmental distractions;Small sips/bites;Follow solids with liquid;Multiple dry swallows after each bite/sip Postural Changes and/or Swallow Maneuvers: Seated upright 90 degrees                Oral Care Recommendations: Oral care BID Follow up Recommendations: Skilled Nursing facility SLP Visit Diagnosis: Dysphagia, oropharyngeal phase (R13.12) Plan: Continue with current plan of care       GO               Rodney Estrada Rodney Estrada, Rodney Estrada, Rodney Estrada Speech Language Pathologist Office: [phone]  Rodney Estrada 04/19/2020, 2:58 PM

## 2020-04-19 NOTE — TOC Progression Note (Signed)
Transition of Care [facility]) - Progression Note    Patient Details  Name: Rodney Estrada MRN: [id number] Date of Birth: 08-10-1951  Transition of Care [facility]) CM/SW [place], [place] Phone Number: 04/19/2020, 11:20 AM  Clinical Narrative:   CSW continuing to investigate patient receiving COVID vaccine while inpatient, as required for SNF for bed placement. Patient had not received a dose of the vaccine, so CSW attempting to see if patient can receive [employer] vaccine prior to discharge. CSW to follow.    Expected Discharge Plan: [place] Barriers to Discharge: Continued Medical Work up  Expected Discharge Plan and Services Expected Discharge Plan: [place] In-house Referral: Clinical Social Work Discharge Planning Services: CM Consult Post Acute Care Choice: [place] Living arrangements for the past 2 months: Apartment Expected Discharge Date: 04/16/20                                     Social Determinants of Health (SDOH) Interventions    Readmission Risk Interventions No flowsheet data found.

## 2020-04-19 NOTE — NC FL2 (Signed)
[place] LEVEL OF CARE SCREENING TOOL     IDENTIFICATION  Patient Name: Rodney Estrada Birthdate: [birth year] Sex: male Admission Date (Current Location): 04/05/2020  [facility] and [place] Number:  Engineering geologist and Address:  [facility] [place], [place] [address] [place], [place] [address]      Provider Number: [id number]  Attending Physician Name and Address:  Rosalin Hawking, MD  Relative Name and Phone Number:       Current Level of Care: Hospital Recommended Level of Care: [place] Prior Approval Number:    Date Approved/Denied:   PASRR Number: [id number]  Discharge Plan: SNF    Current Diagnoses: Patient Active Problem List   Diagnosis Date Noted  . Cerebral edema ([place]) 04/16/2020  . Hypertensive emergency 04/16/2020  . Essential hypertension 04/16/2020  . Hyperlipidemia LDL goal <70 04/16/2020  . Uncontrolled type II diabetes mellitus ([place]) 04/16/2020  . 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 04/16/2020  . AKI (acute kidney injury) ([place]) 04/16/2020  . Tobacco use disorder 04/16/2020  . Alcohol abuse 04/16/2020  . Cocaine abuse ([place]) 04/16/2020  . Obesity 04/16/2020  . 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 04/05/2020    Orientation RESPIRATION BLADDER Height & Weight     Self, Time, Place, Situation  Normal Incontinent Weight: 218 lb 7.6 oz (99.1 kg) Height:  5\' 9"  (175.3 cm)  BEHAVIORAL SYMPTOMS/MOOD NEUROLOGICAL BOWEL NUTRITION STATUS      Incontinent Diet (see DC summary)  AMBULATORY STATUS COMMUNICATION OF NEEDS Skin   Extensive Assist Verbally Surgical wounds (closed right groin, no dressing)                       Personal Care Assistance Level of Assistance  Bathing, Feeding, Dressing Bathing Assistance: Maximum assistance Feeding assistance: Limited assistance Dressing Assistance: Maximum assistance     Functional Limitations Info  Speech     Speech  Info: Impaired (dysarthria)    SPECIAL CARE FACTORS FREQUENCY  PT (By licensed PT), OT (By licensed OT), Speech therapy     PT Frequency: 5x/wk OT Frequency: 5x/wk     Speech Therapy Frequency: 5x/wk      Contractures Contractures Info: Not present    Additional Factors Info  Code Status, Allergies, Insulin Sliding Scale Code Status Info: Full Allergies Info: NKA   Insulin Sliding Scale Info: 0-15 units 3x/day with meals; Levemir 8 units 2x/day       Current Medications (04/19/2020):  This is the current hospital active medication list Current Facility-Administered Medications  Medication Dose Route Frequency Provider Last Rate Last Admin  .  stroke: mapping our early stages of recovery book   Does not apply Once Burnetta Sabin L, NP      . 0.9 %  sodium chloride infusion   Intravenous Continuous Rosalin Hawking, MD 75 mL/hr at 04/18/20 2124 Rate Change at 04/18/20 2124  . acetaminophen (TYLENOL) tablet 650 mg  650 mg Oral Q4H PRN Donzetta Starch, NP   650 mg at 04/18/20 2128   Or  . acetaminophen (TYLENOL) 160 MG/5ML solution 650 mg  650 mg Per Tube Q4H PRN Donzetta Starch, NP   650 mg at 04/14/20 0800   Or  . acetaminophen (TYLENOL) suppository 650 mg  650 mg Rectal Q4H PRN Donzetta Starch, NP      . aspirin chewable tablet 81 mg  81 mg Oral Daily Donzetta Starch, NP  81 mg at 04/19/20 1056  . atorvastatin (LIPITOR) tablet 80 mg  80 mg Oral Daily Burnetta Sabin L, NP   80 mg at 04/19/20 1056  . chlorhexidine (PERIDEX) 0.12 % solution 15 mL  15 mL Mouth Rinse BID Burnetta Sabin L, NP   15 mL at 04/19/20 1058  . diphenhydrAMINE (BENADRYL) capsule 25 mg  25 mg Oral Q6H PRN Garvin Fila, MD   25 mg at 04/19/20 1057  . enoxaparin (LOVENOX) injection 40 mg  40 mg Subcutaneous Q24H Burnetta Sabin L, NP   40 mg at 04/18/20 2000  . folic acid (FOLVITE) tablet 1 mg  1 mg Oral Daily Biby, Sharon L, NP   1 mg at 04/19/20 1057  . hydrALAZINE (APRESOLINE) injection 5-20 mg  5-20 mg Intravenous Q2H  PRN Donzetta Starch, NP   10 mg at 04/16/20 0432  . insulin aspart (novoLOG) injection 0-15 Units  0-15 Units Subcutaneous TID WC Rosalin Hawking, MD   2 Units at 04/19/20 1059  . insulin detemir (LEVEMIR) injection 8 Units  8 Units Subcutaneous BID Donzetta Starch, NP   8 Units at 04/19/20 1058  . labetalol (NORMODYNE) tablet 100 mg  100 mg Oral TID Rosalin Hawking, MD   100 mg at 04/19/20 1057  . MEDLINE mouth rinse  15 mL Mouth Rinse q12n4p Burnetta Sabin L, NP   15 mL at 04/18/20 1723  . multivitamin with minerals tablet 1 tablet  1 tablet Oral Daily Burnetta Sabin L, NP   1 tablet at 04/19/20 1057  . nicotine (NICODERM CQ - dosed in mg/24 hours) patch 14 mg  14 mg Transdermal Daily Burnetta Sabin L, NP   14 mg at 04/19/20 1100  . pantoprazole sodium (PROTONIX) 40 mg/20 mL oral suspension 40 mg  40 mg Oral Daily Burnetta Sabin L, NP   40 mg at 04/19/20 1058  . phenol (CHLORASEPTIC) mouth spray 2 spray  2 spray Mouth/Throat PRN Greta Doom, MD      . Resource ThickenUp Clear   Oral PRN Garvin Fila, MD      . senna-docusate (Senokot-S) tablet 1 tablet  1 tablet Oral QHS PRN Burnetta Sabin L, NP      . sodium chloride flush (NS) 0.9 % injection 10-40 mL  10-40 mL Intracatheter Q12H Rosalin Hawking, MD   10 mL at 04/19/20 1059  . sodium chloride flush (NS) 0.9 % injection 10-40 mL  10-40 mL Intracatheter PRN Rosalin Hawking, MD      . thiamine tablet 100 mg  100 mg Oral Daily Donzetta Starch, NP   100 mg at 04/19/20 1057     Discharge Medications: Please see discharge summary for a list of discharge medications.  Relevant Imaging Results:  Relevant Lab Results:   Additional Information SS#: [id number]  Geralynn Ochs, LCSW

## 2020-04-19 NOTE — Progress Notes (Signed)
   Covid-19 Vaccination Clinic  Name:  Franke Menter    MRN: [id number] DOB: October 06, 1950  04/19/2020  Mr. Fritze was observed post Covid-19 immunization for 15 minutes without incident. He was provided with Vaccine Information Sheet and instruction to access the V-Safe system.   Mr. Fiorella was instructed to call 911 with any severe reactions post vaccine: [name] Difficulty breathing  . Swelling of face and throat  . A fast heartbeat  . A bad rash all over body  . Dizziness and weakness   Immunizations Administered    Name Date Dose VIS Date Route   JANSSEN COVID-19 VACCINE 04/19/2020  3:00 PM 0.5 mL 11/22/2019 Intramuscular   Manufacturer: [employer]   Lot: [id number]   [place]: 30735-430-14

## 2020-04-19 NOTE — Progress Notes (Signed)
Calorie Count Note  48 hour calorie count ordered.  Diet: DYS 1, honey thick Supplements: Magic Cup TID, MVI  No meal tickets in envelope.  One meal completion documented (50% on 7/26).  D/C calorie count due to lack of results.   Mariana Single RD, LDN Clinical Nutrition Pager listed in [place]

## 2020-05-06 ENCOUNTER — Ambulatory Visit (HOSPITAL_COMMUNITY): Payer: 59

## 2020-05-19 ENCOUNTER — Encounter: Payer: Self-pay | Admitting: Adult Health

## 2020-05-19 ENCOUNTER — Ambulatory Visit (INDEPENDENT_AMBULATORY_CARE_PROVIDER_SITE_OTHER): Payer: 59 | Admitting: Adult Health

## 2020-05-19 VITALS — BP 145/96 | HR 66

## 2020-05-19 DIAGNOSIS — I63511 Cerebral infarction due to unspecified occlusion or stenosis of right middle cerebral artery: Secondary | ICD-10-CM | POA: Diagnosis not present

## 2020-05-19 DIAGNOSIS — I10 Essential (primary) hypertension: Secondary | ICD-10-CM

## 2020-05-19 DIAGNOSIS — E785 Hyperlipidemia, unspecified: Secondary | ICD-10-CM | POA: Diagnosis not present

## 2020-05-19 DIAGNOSIS — E1165 Type 2 diabetes mellitus with hyperglycemia: Secondary | ICD-10-CM | POA: Diagnosis not present

## 2020-05-19 DIAGNOSIS — I669 Occlusion and stenosis of unspecified cerebral artery: Secondary | ICD-10-CM

## 2020-05-19 NOTE — Progress Notes (Signed)
[facility] [address] [place]. Rodney Estrada [address] [phone]       HOSPITAL FOLLOW UP NOTE  Rodney Estrada Date of Birth:  04/16/1951 Medical Record Number:  [id number]   Reason for Referral:  hospital stroke follow up    SUBJECTIVE:   CHIEF COMPLAINT:  Chief Complaint  Patient presents with   Hospitalization Follow-up    HFU after stroke. States he has been doing well since being discharged.    room 5    with sister    HPI:   RodneyRodney Edwardsis a 69 y.o.malewith history oftobacco usepresented on 04/05/2020 withL sided flaccidity, R gaze, L facial droop, and periods of apnea in setting of BP 260/140. Stroke work-up revealed right MCA infarct s/p TPA (NIHSS 19) and IR with complete revascularization of right M1 following angioplasty with stent placement, infarct secondary to large vessel disease.  Initially placed on aspirin and Brilinta post stent for concern of allergic rash with Brilinta use therefore recommended aspirin and Plavix for at least 70-month duration post stent.  Hypertensive emergency upon arrival treated with Cleviprex placed on labetalol at discharge.  Direct LDL 108.2 with elevated triglycerides and initiate atorvastatin 80 mg daily.  New diagnosis of uncontrolled DM with A1c 13.5.  Current tobacco use with smoking cessation counseling provided.  Other stroke risk factors include heavy EtOH use, cocaine abuse, advanced age and obesity.  No prior stroke history.  Residual deficits of mild dysarthria, cognitive impairment, right gaze preference, left hemianopia, left facial droop, left hemiparesis and dysphagia.  Evaluated by therapies who recommended discharge to SNF for ongoing therapy needs.  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 Numerous chronic lacunes B basal ganglia, R  thalamic, white matter. ASPECTS 10.   CTA head R M1 occlusion.Intracranial B ICA moderate stenosis. B P2 w/ moderate stenoses.  CTA neck proximal L ICA 40-50% stenosis. R ICA bifurcation & proximal w/ mixed plaque. Mild to moderate distal cervical R ICA. L VA origin moderate stenosis.  Cerebral angio R M1 occlusion s/p EVT and R M1 stent placed w/ complete revascularization  CT head 7/12 1715 developing R MCA infarct. Mild hyperdensity R putamen, ? Contrast vs hemorrhage.   MRI Evolving R large MCA infarct (basal ganglia, insula, temporal, frontal lobes). Minimal petechial hemorrhage.  MRA interval R M1 stent patent  CT repeat 7/15stable extensive R MCA infarct w/ regional mass effect and partial effacement R temporal horn, mild petechial hemorrhage. R M1 MCA stent, asymmetric hyperdense R M2.  CT and CTA 04/10/20 - Subacute infarct right MCA territory with decreased cytotoxic edema. No hemorrhage or new infarct.Moderate stenosis right supraclinoid internal carotid artery due to circumferential calcific stenosis. Right MCA stent is patent with good flow to right MCA branches.Moderate stenosis left supraclinoid internal carotid artery due to calcific stenosis. Left middle cerebral artery widely patent. Anterior cerebral arteries patent bilaterally.Severe stenosis left P2 segment. Mild stenosis right posterior cerebral artery.  2D EchoEF 50-55%  TG550->261, direct LDL108.2   HgbA1c13.5  No antithromboticprior to admission, treated w/ cangrelor post IR, loaded w/ Brilinta 180 and aspirin post op,was onaspirin 81 mg daily and Brilinta (ticagrelor) 90 mg bid. Brilinta now on hold given allergic rash. Given possible allergy to Brilinta and new stent placement, will add plavix to aspirn ASA. Will need to continue at least 6 months post stent placement.  Therapy recommendations: SNF  Disposition:SNF   COVID testing for SNF placement NEGATIVE. [employer] vaccine  given during hospitalization (had received 1 dose of Pfizer prior to admission)  Today, 05/19/2020, [name] is being seen for hospital follow-up accompanied by his sister   Residual deficits of left hemiplegia, sensory impairment left hemianopia, right gaze preference, left facial droop, dysarthria and likely mild cognitive impairment Improvement of dysphagia and currently on regular diet Currently working with PT/OT/SLP at facility -he denies improvement and is frustrated with his continued deficits Denies new or worsening stroke/TIA symptoms  Remains on aspirin and Plavix without bleeding or bruising Remains on atorvastatin 80 mg daily without myalgias Blood pressure today 145/96.  Per facility report, pressures have been fluctuating ranging 120-150s/70-80s Glucose levels stable per facility report typically ranging mid 100s.  Recent A1c 12.0 (04/22/2020)  He has not had follow-up with vascular surgery Dr. Ladean Raya as recommended at discharge for unknown reason  No further concerns     ROS:   14 system review of systems performed and negative with exception of see HPI  PMH: No past medical history on file.  PSH:  Past Surgical History:  Procedure Laterality Date   IR CT HEAD LTD  04/05/2020   IR INTRA CRAN STENT  04/05/2020   IR PERCUTANEOUS ART THROMBECTOMY/INFUSION INTRACRANIAL INC DIAG ANGIO  04/05/2020       IR PERCUTANEOUS ART THROMBECTOMY/INFUSION INTRACRANIAL INC DIAG ANGIO  04/05/2020   RADIOLOGY WITH ANESTHESIA N/A 04/05/2020   Procedure: IR WITH ANESTHESIA;  Surgeon: Radiologist, Medication, MD;  Location: [facility];  Service: Radiology;  Laterality: N/A;    Social History:  Social History   Socioeconomic History   Marital status: Unknown    Spouse name: Not on file   Number of children: Not on file   Years of education: Not on file   Highest education level: Not on file  Occupational History   Not on file  Tobacco Use   Smoking status:  Current Every Day Smoker   Smokeless tobacco: Never Used  Substance and Sexual Activity   Alcohol use: Yes    Alcohol/week: 10.0 standard drinks    Types: 10 Cans of beer per week    Comment: every DAy   Drug use: Not Currently   Sexual activity: Not on file  Other Topics Concern   Not on file  Social History Narrative   Not on file   Social Determinants of Health   Financial Resource Strain:    Difficulty of Paying Living Expenses: Not on file  Food Insecurity:    Worried About [place] in the Last Year: Not on file   Ran Out of Food in the Last Year: Not on file  Transportation Needs:    Lack of Transportation (Medical): Not on file   Lack of Transportation (Non-Medical): Not on file  Physical Activity:    Days of Exercise per Week: Not on file   Minutes of Exercise per Session: Not on file  Stress:    Feeling of Stress : Not on file  Social Connections:    Frequency of Communication with Friends and Family: Not on file   Frequency of Social Gatherings with Friends and Family: Not on file   Attends Religious Services: Not on file   Active Member of Clubs or Organizations: Not on file   Attends Archivist Meetings: Not on file   Marital Status: Not on file  Intimate Partner Violence:  Fear of Current or Ex-Partner: Not on file   Emotionally Abused: Not on file   Physically Abused: Not on file   Sexually Abused: Not on file    Family History:  Family History  Problem Relation Age of Onset   Hypertension Mother    Hypertension Father     Medications:   Current Outpatient Medications on File Prior to Visit  Medication Sig Dispense Refill   aspirin 81 MG chewable tablet Chew 1 tablet (81 mg total) by mouth daily.     atorvastatin (LIPITOR) 80 MG tablet Take 1 tablet (80 mg total) by mouth daily.     clopidogrel (PLAVIX) 75 MG tablet Take 1 tablet (75 mg total) by mouth daily.     diphenhydrAMINE (BENADRYL) 25 mg  capsule Take 1 capsule (25 mg total) by mouth every 6 (six) hours as needed for itching. 30 capsule 0   insulin aspart (NOVOLOG) 100 UNIT/ML injection Inject 0-15 Units into the skin every 4 (four) hours. 10 mL 11   insulin detemir (LEVEMIR) 100 UNIT/ML injection Inject 0.08 mLs (8 Units total) into the skin 2 (two) times daily. 10 mL 11   labetalol (NORMODYNE) 100 MG tablet Take 1 tablet (100 mg total) by mouth 3 (three) times daily.     lisinopril (ZESTRIL) 20 MG tablet Take 1 tablet (20 mg total) by mouth daily.     Maltodextrin-Xanthan Gum (RESOURCE THICKENUP CLEAR) POWD Take 120 g by mouth as needed (honey thick liquids).     nicotine (NICODERM CQ - DOSED IN MG/24 HOURS) 14 mg/24hr patch Place 1 patch (14 mg total) onto the skin daily. 28 patch 0   No current facility-administered medications on file prior to visit.    Allergies:  No Known Allergies    OBJECTIVE:  Physical Exam  Vitals:   05/19/20 1358  BP: (!) 145/96  Pulse: 66   There is no height or weight on file to calculate BMI. No exam data present  No flowsheet data found.   General: well developed, well nourished,  pleasant middle-age Caucasian male, seated, in no evident distress Head: head normocephalic and atraumatic.   Neck: supple with no carotid or supraclavicular bruits Cardiovascular: regular rate and rhythm, no murmurs Musculoskeletal: no deformity Skin:  no rash/petichiae Vascular:  Normal pulses all extremities   Neurologic Exam Mental Status: Awake and fully alert.   Mild dysarthria.  Oriented to time but disoriented to place. Recent and remote memory impaired. Attention span, concentration and fund of knowledge fluctuated between appropriate and impaired.  Follows simple commands with slight difficulty.  Mood and affect appropriate.  Cranial Nerves: Fundoscopic exam reveals sharp disc margins. Pupils equal, briskly reactive to light.  Rightgazepreference with incomplete left gaze.  No blink to  threat on left side with likely left hemianopia. Hearing intact. Facial sensation intact.  Left facial droop.  Tongue, and palate moves normally and symmetrically.  Motor: Full strength right upper and lower extremity LUE: 0/5 and increased spasticity LLE: 1/5 with involuntary movement to stimuli; increased tone throughout with limited knee movement Sensory.:  Decreased light touch sensation left upper and lower extremity Coordination: Rapid alternating movements normal on right side. Finger-to-nose and heel-to-shin performed accurately on right side. Gait and Station: Deferred as patient nonambulatory Reflexes: 1+ and symmetric. Toes downgoing.     NIHSS 14 (19 on admission) 1a.  Level of consciousness 0 1b. LOC questions 0 1c. LOC commands 0 2.  Best gaze 1 3.  Visual 2 4.  Facial palsy 2 5a.  Motor arm-left 4 5b.  Motor arm-right 0 6a.  Motor leg-left 3 6b.  Motor leg-right 0 7.  Limb ataxia 0 8.  Sensory 1 9.  Best language 0 10.  Dysarthria 1 11.  Extinction and inattention 0  Modified Rankin  4     ASSESSMENT: Rodney Estrada is a 69 y.o. year old male presented with left-sided flaccidity, right gaze, left facial droop, periods of apnea and hypertensive emergency on 04/05/2020 with stroke work-up revealing R MCA infarct s/p TPA and IR with complete revascularization of right M1 following angioplasty with stent placement, infarct secondary to large vessel disease. Vascular risk factors include HTN, HLD, newly diagnosed uncontrolled DM, intra and extracranial stenosis, tobacco use, alcohol abuse, cocaine abuse and advanced age.      PLAN:  1. R MCA stroke :  a. Residual deficit: Left hemiplegia with sensory impairment, R gaze preference, left hemianopia, left facial droop, cognitive impairment and dysarthria.  i. Continue participation with therapy and encouraged doing exercises on his own as able b. Continue aspirin 81 mg daily and clopidogrel 75 mg daily  and atorvastatin  80 mg daily for secondary stroke prevention.  c. Close PCP follow up for aggressive stroke risk factor management  2. Intra and extracranial stenosis: a. S/p R M1 stent -needs follow-up with [facility] -facility advised b. Continue aspirin and Plavix for at least 66-month duration as well as ongoing statin use which will be managed by [facility] 3. HTN:  a. BP goal <130/90.   b. Stable.   c. Managed by facility.  4. HLD:  a. LDL goal <70. Recent LDL 108.   b. Continue atorvastatin 80 mg daily.   c. Managed and prescribed by facility.   5. DMII:  a. A1c goal<7.0. Recent A1c 12.0.  Managed by facility 6. Substance abuse a. complete cessation currently as he is currently residing in a SNF     Follow up in 3 months or call earlier if needed   I spent 45 minutes of face-to-face and non-face-to-face time with patient and sister.  This included previsit chart review, lab review, study review, order entry, electronic health record documentation, patient education regarding recent stroke and etiology, residual deficits, importance of managing stroke risk factors and answered all questions to patient satisfaction   Frann Rider, [facility]  [facility] [address] [place], [place] [address]  Phone [phone] Fax [phone] Note: This document was prepared with digital dictation and possible smart phrase technology. Any transcriptional errors that result from this process are unintentional.

## 2020-05-19 NOTE — Patient Instructions (Signed)
Residual deficits of left hemiplegia with sensory impairment, left facial droop, dysarthria, left hemianopia and right gaze preference  Ensure continued participation in therapies for hopeful ongoing improvement Please ensure frequent position changes to avoid pressure ulcers as well PROM to avoid contractures  Ensure follow-up scheduled to be seen by Dr. Karenann Cai for monitoring of stent placement  Continue aspirin 81 mg daily and clopidogrel 75 mg daily  and atorvastatin 80 mg daily for secondary stroke prevention  Continue to follow up with PCP/facility regarding blood pressure, cholesterol and diabetes management  Maintain strict control of hypertension with blood pressure goal below 130/90, diabetes with hemoglobin A1c goal below 7.0 % and cholesterol with LDL cholesterol (bad cholesterol) goal below 70 mg/dL.       Followup in the future with me in 3 months or call earlier if needed       Thank you for coming to see [place] at [facility]. I hope we have been able to provide you high quality care today.  You may receive a patient satisfaction survey over the next few weeks. We would appreciate your feedback and comments so that we may continue to improve ourselves and the health of our patients.

## 2020-05-20 ENCOUNTER — Ambulatory Visit (HOSPITAL_COMMUNITY): Payer: 59

## 2020-05-25 NOTE — Progress Notes (Signed)
I agree with the above plan 

## 2020-07-13 ENCOUNTER — Other Ambulatory Visit: Payer: Self-pay

## 2020-07-13 NOTE — Patient Outreach (Signed)
[facility]) Care Management  07/13/2020  Rodney Estrada Jul 18, 1951 [id number]  First telephone outreach attempt to obtain mRS. No answer. Left message for returned call for Nurse or caregiver for patient at [facility].  [facility] Management Assistant [phone]

## 2020-07-23 ENCOUNTER — Other Ambulatory Visit: Payer: Self-pay

## 2020-07-23 NOTE — Patient Outreach (Signed)
[facility]) Care Management  07/23/2020  Othel Hoogendoorn Jan 24, 1951 [id number]  Telephone outreach to patient to obtain mRS was successfully completed. Spoke with nurse- Janett Billow, at [facility] where patient resides at this time. MRS=4.  [place] Management Assistant [phone]

## 2020-09-16 ENCOUNTER — Ambulatory Visit: Payer: Medicare Other | Admitting: Adult Health

## 2020-09-30 ENCOUNTER — Encounter: Payer: Self-pay | Admitting: Adult Health

## 2020-09-30 ENCOUNTER — Ambulatory Visit: Payer: Medicare Other | Admitting: Adult Health

## 2020-09-30 NOTE — Progress Notes (Deleted)
[facility] [address] [place]. [place] [address] [phone]       HOSPITAL FOLLOW UP NOTE  [name] Date of Birth:  1950-11-28 Medical Record Number:  [id number]   Reason for Referral:  hospital stroke follow up    SUBJECTIVE:   CHIEF COMPLAINT:  No chief complaint on file.   HPI:   Today, 09/30/2020, [name] returns for prolonged 94-month stroke follow-up with prior visit on 05/19/2020.  Reports residual ***.  Denies new or worsening stroke/TIA symptoms.  Remains on aspirin, Plavix and atorvastatin for secondary stroke prevention without side effects.  Blood pressure today ***.      History provided for reference purposes only Initial visit 05/19/2020 JM: Rodney Estrada is being seen for hospital follow-up accompanied by his sister Residual deficits of left hemiplegia, sensory impairment left hemianopia, right gaze preference, left facial droop, dysarthria and likely mild cognitive impairment Improvement of dysphagia and currently on regular diet Currently working with PT/OT/SLP at facility -he denies improvement and is frustrated with his continued deficits Denies new or worsening stroke/TIA symptoms Remains on aspirin and Plavix without bleeding or bruising Remains on atorvastatin 80 mg daily without myalgias Blood pressure today 145/96.  Per facility report, pressures have been fluctuating ranging 120-150s/70-80s Glucose levels stable per facility report typically ranging mid 100s.  Recent A1c 12.0 (04/22/2020) He has not had follow-up with vascular surgery Dr. Salvadore Estrada as recommended at discharge for unknown reason No further concerns  Stroke admission 04/05/2020 Rodney Estrada a 69 y.o.malewith history oftobacco usepresented on 04/05/2020 withL sided flaccidity, R gaze, L facial droop, and periods of apnea in setting of BP 260/140. Stroke work-up revealed right MCA infarct s/p TPA (NIHSS 19) and IR with complete  revascularization of right M1 following angioplasty with stent placement, infarct secondary to large vessel disease.  Initially placed on aspirin and Brilinta post stent for concern of allergic rash with Brilinta use therefore recommended aspirin and Plavix for at least 77-month duration post stent.  Hypertensive emergency upon arrival treated with Cleviprex placed on labetalol at discharge.  Direct LDL 108.2 with elevated triglycerides and initiate atorvastatin 80 mg daily.  New diagnosis of uncontrolled DM with A1c 13.5.  Current tobacco use with smoking cessation counseling provided.  Other stroke risk factors include heavy EtOH use, cocaine abuse, advanced age and obesity.  No prior stroke history.  Residual deficits of mild dysarthria, cognitive impairment, right gaze preference, left hemianopia, left facial droop, left hemiparesis and dysphagia.  Evaluated by therapies who recommended discharge to SNF for ongoing therapy needs.  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 Numerous chronic lacunes B basal ganglia, R thalamic, white matter. ASPECTS 10.   CTA head R M1 occlusion.Intracranial B ICA moderate stenosis. B P2 w/ moderate stenoses.  CTA neck proximal L ICA 40-50% stenosis. R ICA bifurcation & proximal w/ mixed plaque. Mild to moderate distal cervical R ICA. L VA origin moderate stenosis.  Cerebral angio R M1 occlusion s/p EVT and R M1 stent placed w/ complete revascularization  CT head 7/12 1715 developing R MCA infarct. Mild hyperdensity R putamen, ? Contrast vs hemorrhage.   MRI Evolving R large MCA infarct (basal ganglia, insula, temporal, frontal lobes). Minimal petechial hemorrhage.  MRA interval R M1 stent patent  CT repeat 7/15stable extensive R MCA infarct w/ regional mass  effect and partial effacement R temporal  horn, mild petechial hemorrhage. R M1 MCA stent, asymmetric hyperdense R M2.  CT and CTA 04/10/20 - Subacute infarct right MCA territory with decreased cytotoxic edema. No hemorrhage or new infarct.Moderate stenosis right supraclinoid internal carotid artery due to circumferential calcific stenosis. Right MCA stent is patent with good flow to right MCA branches.Moderate stenosis left supraclinoid internal carotid artery due to calcific stenosis. Left middle cerebral artery widely patent. Anterior cerebral arteries patent bilaterally.Severe stenosis left P2 segment. Mild stenosis right posterior cerebral artery.  2D EchoEF 50-55%  TG550->261, direct LDL108.2   HgbA1c13.5  No antithromboticprior to admission, treated w/ cangrelor post IR, loaded w/ Brilinta 180 and aspirin post op,was onaspirin 81 mg daily and Brilinta (ticagrelor) 90 mg bid. Brilinta now on hold given allergic rash. Given possible allergy to Brilinta and new stent placement, will add plavix to aspirn ASA. Will need to continue at least 6 months post stent placement.   Therapy recommendations: SNF  Disposition:SNF   COVID testing for SNF placement NEGATIVE. [employer] vaccine given during hospitalization (had received 1 dose of Pfizer prior to admission)      ROS:   14 system review of systems performed and negative with exception of see HPI  PMH: No past medical history on file.  PSH:  Past Surgical History:  Procedure Laterality Date  . IR CT HEAD LTD  04/05/2020  . IR INTRA CRAN STENT  04/05/2020  . IR PERCUTANEOUS ART THROMBECTOMY/INFUSION INTRACRANIAL INC DIAG ANGIO  04/05/2020      . IR PERCUTANEOUS ART THROMBECTOMY/INFUSION INTRACRANIAL INC DIAG ANGIO  04/05/2020  . RADIOLOGY WITH ANESTHESIA N/A 04/05/2020   Procedure: IR WITH ANESTHESIA;  Surgeon: Radiologist, Medication, MD;  Location: [facility];  Service: Radiology;  Laterality: N/A;    Social History:  Social History   Socioeconomic  History  . Marital status: Unknown    Spouse name: Not on file  . Number of children: Not on file  . Years of education: Not on file  . Highest education level: Not on file  Occupational History  . Not on file  Tobacco Use  . Smoking status: Current Every Day Smoker  . Smokeless tobacco: Never Used  Substance and Sexual Activity  . Alcohol use: Yes    Alcohol/week: 10.0 standard drinks    Types: 10 Cans of beer per week    Comment: every DAy  . Drug use: Not Currently  . Sexual activity: Not on file  Other Topics Concern  . Not on file  Social History Narrative  . Not on file   Social Determinants of Health   Financial Resource Strain: Not on file  Food Insecurity: Not on file  Transportation Needs: Not on file  Physical Activity: Not on file  Stress: Not on file  Social Connections: Not on file  Intimate Partner Violence: Not on file    Family History:  Family History  Problem Relation Age of Onset  . Hypertension Mother   . Hypertension Father     Medications:   Current Outpatient Medications on File Prior to Visit  Medication Sig Dispense Refill  . aspirin 81 MG chewable tablet Chew 1 tablet (81 mg total) by mouth daily.    [name] atorvastatin (LIPITOR) 80 MG tablet Take 1 tablet (80 mg total) by mouth daily.    . clopidogrel (PLAVIX) 75 MG tablet Take 1 tablet (75 mg total) by mouth daily.    . diphenhydrAMINE (BENADRYL) 25 mg capsule  Take 1 capsule (25 mg total) by mouth every 6 (six) hours as needed for itching. 30 capsule 0  . insulin aspart (NOVOLOG) 100 UNIT/ML injection Inject 0-15 Units into the skin every 4 (four) hours. 10 mL 11  . insulin detemir (LEVEMIR) 100 UNIT/ML injection Inject 0.08 mLs (8 Units total) into the skin 2 (two) times daily. 10 mL 11  . labetalol (NORMODYNE) 100 MG tablet Take 1 tablet (100 mg total) by mouth 3 (three) times daily.    [name] lisinopril (ZESTRIL) 20 MG tablet Take 1 tablet (20 mg total) by mouth daily.    .  Maltodextrin-Xanthan Gum (RESOURCE THICKENUP CLEAR) POWD Take 120 g by mouth as needed (honey thick liquids).    . nicotine (NICODERM CQ - DOSED IN MG/24 HOURS) 14 mg/24hr patch Place 1 patch (14 mg total) onto the skin daily. 28 patch 0   No current facility-administered medications on file prior to visit.    Allergies:  No Known Allergies    OBJECTIVE:  Physical Exam  There were no vitals filed for this visit. There is no height or weight on file to calculate BMI. No exam data present   General: well developed, well nourished,  pleasant middle-age Caucasian male, seated, in no evident distress Head: head normocephalic and atraumatic.   Neck: supple with no carotid or supraclavicular bruits Cardiovascular: regular rate and rhythm, no murmurs Musculoskeletal: no deformity Skin:  no rash/petichiae Vascular:  Normal pulses all extremities   Neurologic Exam Mental Status: Awake and fully alert.   Mild dysarthria.  Oriented to time but disoriented to place. Recent and remote memory impaired. Attention span, concentration and fund of knowledge fluctuated between appropriate and impaired.  Follows simple commands with slight difficulty.  Mood and affect appropriate.  Cranial Nerves: =Pupils equal, briskly reactive to light.  Rightgazepreference with incomplete left gaze.  No blink to threat on left side with likely left hemianopia. Hearing intact. Facial sensation intact.  Left facial droop.  Tongue, and palate moves normally and symmetrically.  Motor: Full strength right upper and lower extremity LUE: 0/5 and increased spasticity LLE: 1/5 with involuntary movement to stimuli; increased tone throughout with limited knee movement Sensory.:  Decreased light touch sensation left upper and lower extremity Coordination: Rapid alternating movements normal on right side. Finger-to-nose and heel-to-shin performed accurately on right side. Gait and Station: Deferred as patient  nonambulatory Reflexes: 1+ and symmetric. Toes downgoing.     NIHSS 14 (19 on admission) 1a.  Level of consciousness 0 1b. LOC questions 0 1c. LOC commands 0 2.  Best gaze 1 3.  Visual 2 4.  Facial palsy 2 5a.  Motor arm-left 4 5b.  Motor arm-right 0 6a.  Motor leg-left 3 6b.  Motor leg-right 0 7.  Limb ataxia 0 8.  Sensory 1 9.  Best language 0 10.  Dysarthria 1 11.  Extinction and inattention 0  Modified Rankin  4     ASSESSMENT: Rodney Estrada is a 70 y.o. year old male presented with left-sided flaccidity, right gaze, left facial droop, periods of apnea and hypertensive emergency on 04/05/2020 with stroke work-up revealing R MCA infarct s/p TPA and IR with complete revascularization of right M1 following angioplasty with stent placement, infarct secondary to large vessel disease. Vascular risk factors include HTN, HLD, newly diagnosed uncontrolled DM, intra and extracranial stenosis, tobacco use, alcohol abuse, cocaine abuse and advanced age.      PLAN:  1. R MCA stroke :  a. Residual deficit:  Left hemiplegia with sensory impairment, R gaze preference, left hemianopia, left facial droop, cognitive impairment and dysarthria.  i. Continue participation with therapy and encouraged doing exercises on his own as able b. Continue aspirin 81 mg daily and clopidogrel 75 mg daily  and atorvastatin 80 mg daily for secondary stroke prevention.  c. Close PCP follow up for aggressive stroke risk factor management  2. Intra and extracranial stenosis: a. S/p R M1 stent -needs follow-up with [facility] -facility advised b. Continue aspirin and Plavix for at least 21-month duration as well as ongoing statin use which will be managed by [facility] 3. HTN:  a. BP goal <130/90.   b. Stable.   c. Managed by facility.  4. HLD:  a. LDL goal <70.  LDL 41 in 05/2020.  On atorvastatin 80 mg daily.   5. DMII:  a. A1c goal<7.0. Recent A1c 12.0.  Managed by facility 6. Substance abuse a. complete cessation  currently as he is currently residing in a SNF     Follow up in 3 months or call earlier if needed   I spent 45 minutes of face-to-face and non-face-to-face time with patient and sister.  This included previsit chart review, lab review, study review, order entry, electronic health record documentation, patient education regarding recent stroke and etiology, residual deficits, importance of managing stroke risk factors and answered all questions to patient satisfaction   Frann Rider, [facility]  [facility] [address] [place], [place] [address]  Phone [phone] Fax [phone] Note: This document was prepared with digital dictation and possible smart phrase technology. Any transcriptional errors that result from this process are unintentional.

## 2021-08-31 ENCOUNTER — Other Ambulatory Visit: Payer: Self-pay | Admitting: Internal Medicine

## 2021-08-31 DIAGNOSIS — I63511 Cerebral infarction due to unspecified occlusion or stenosis of right middle cerebral artery: Secondary | ICD-10-CM

## 2021-08-31 DIAGNOSIS — R9431 Abnormal electrocardiogram [ECG] [EKG]: Secondary | ICD-10-CM

## 2021-08-31 DIAGNOSIS — R0602 Shortness of breath: Secondary | ICD-10-CM

## 2021-09-08 ENCOUNTER — Ambulatory Visit: Payer: Medicare Other | Attending: Internal Medicine

## 2021-12-13 IMAGING — DX DG CHEST 2V
2 series · 2 of 2 positions shown · non-contrast
Comparison: None.

CLINICAL DATA: History of CVA one week ago.

EXAM:
CHEST - 2 VIEW

[chest lat]
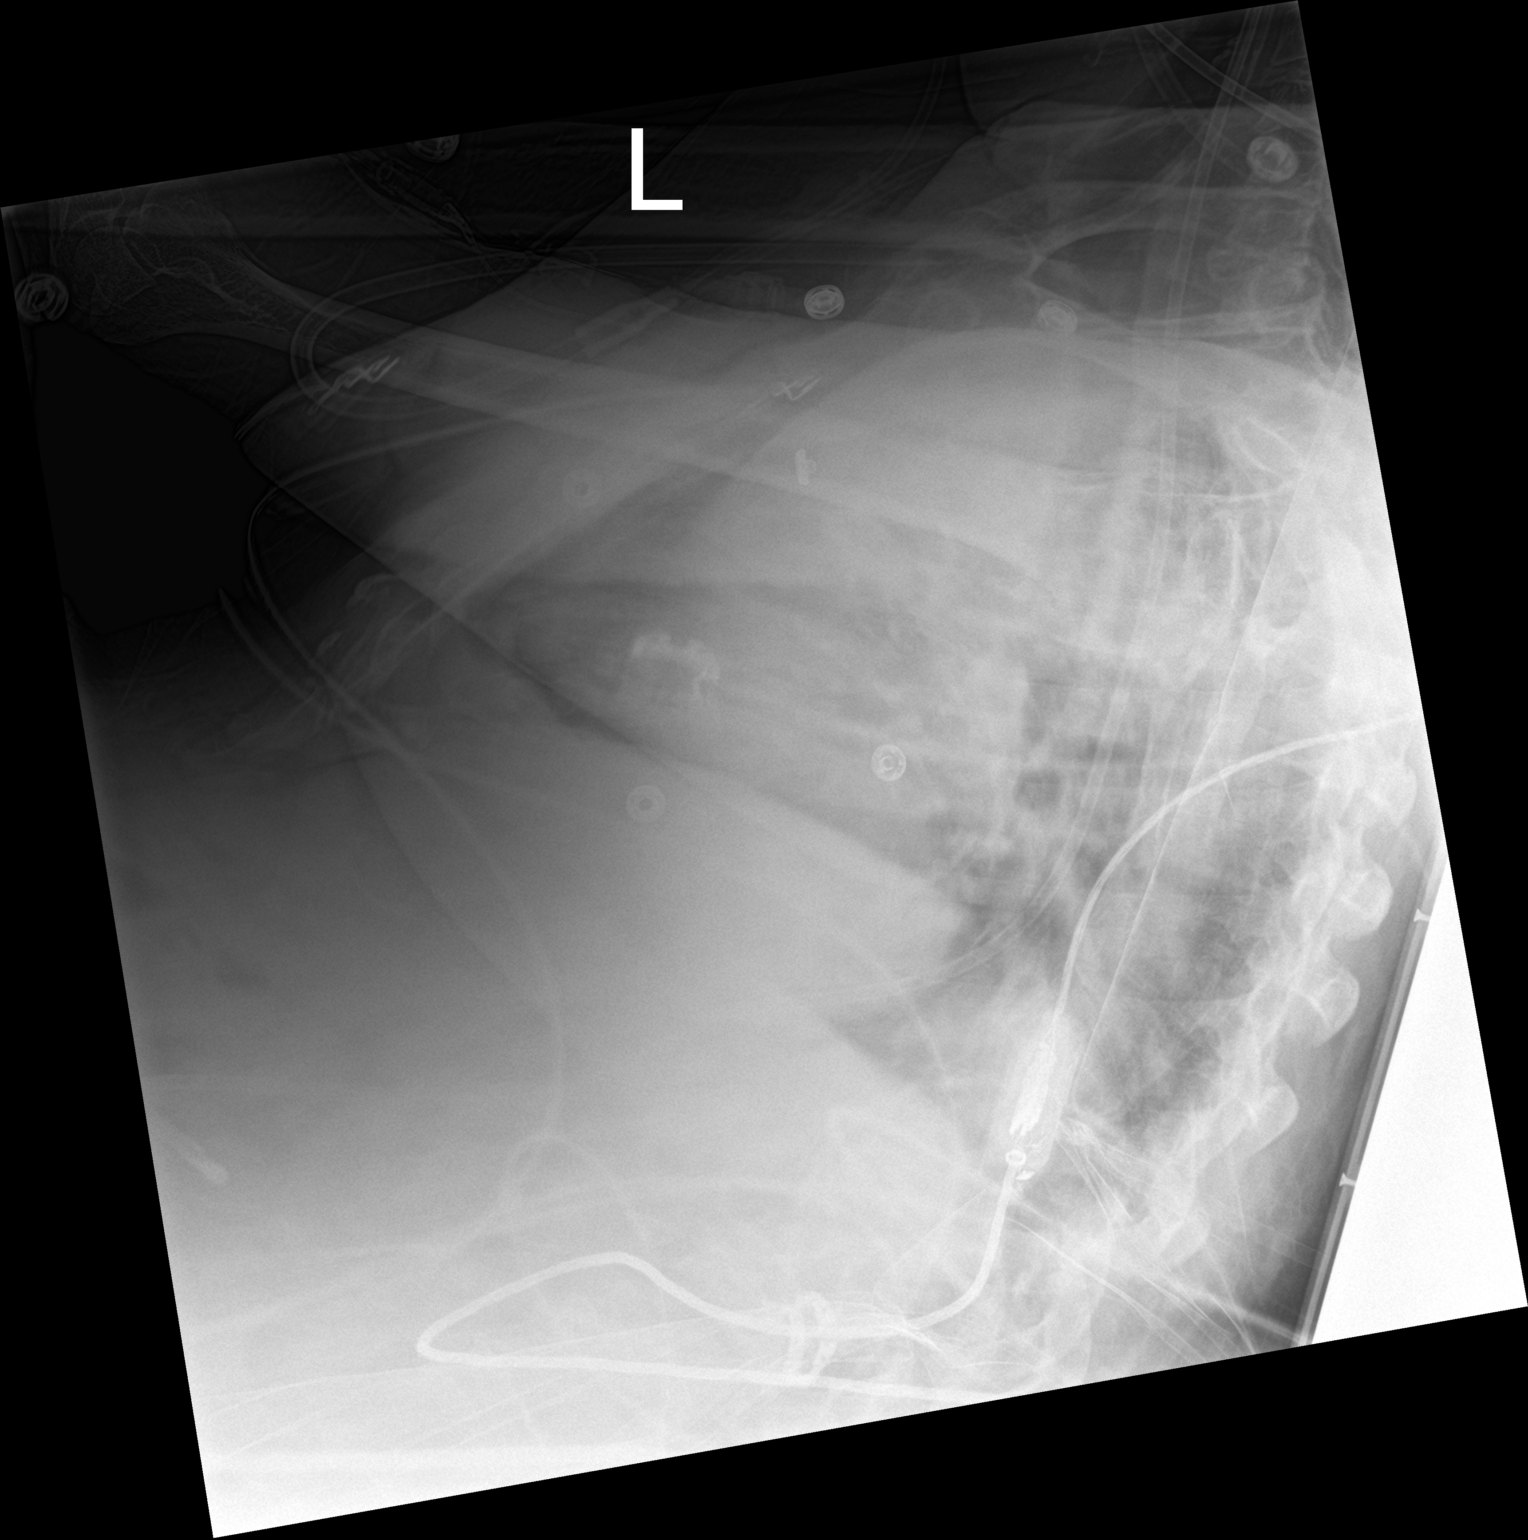

[chest ap]
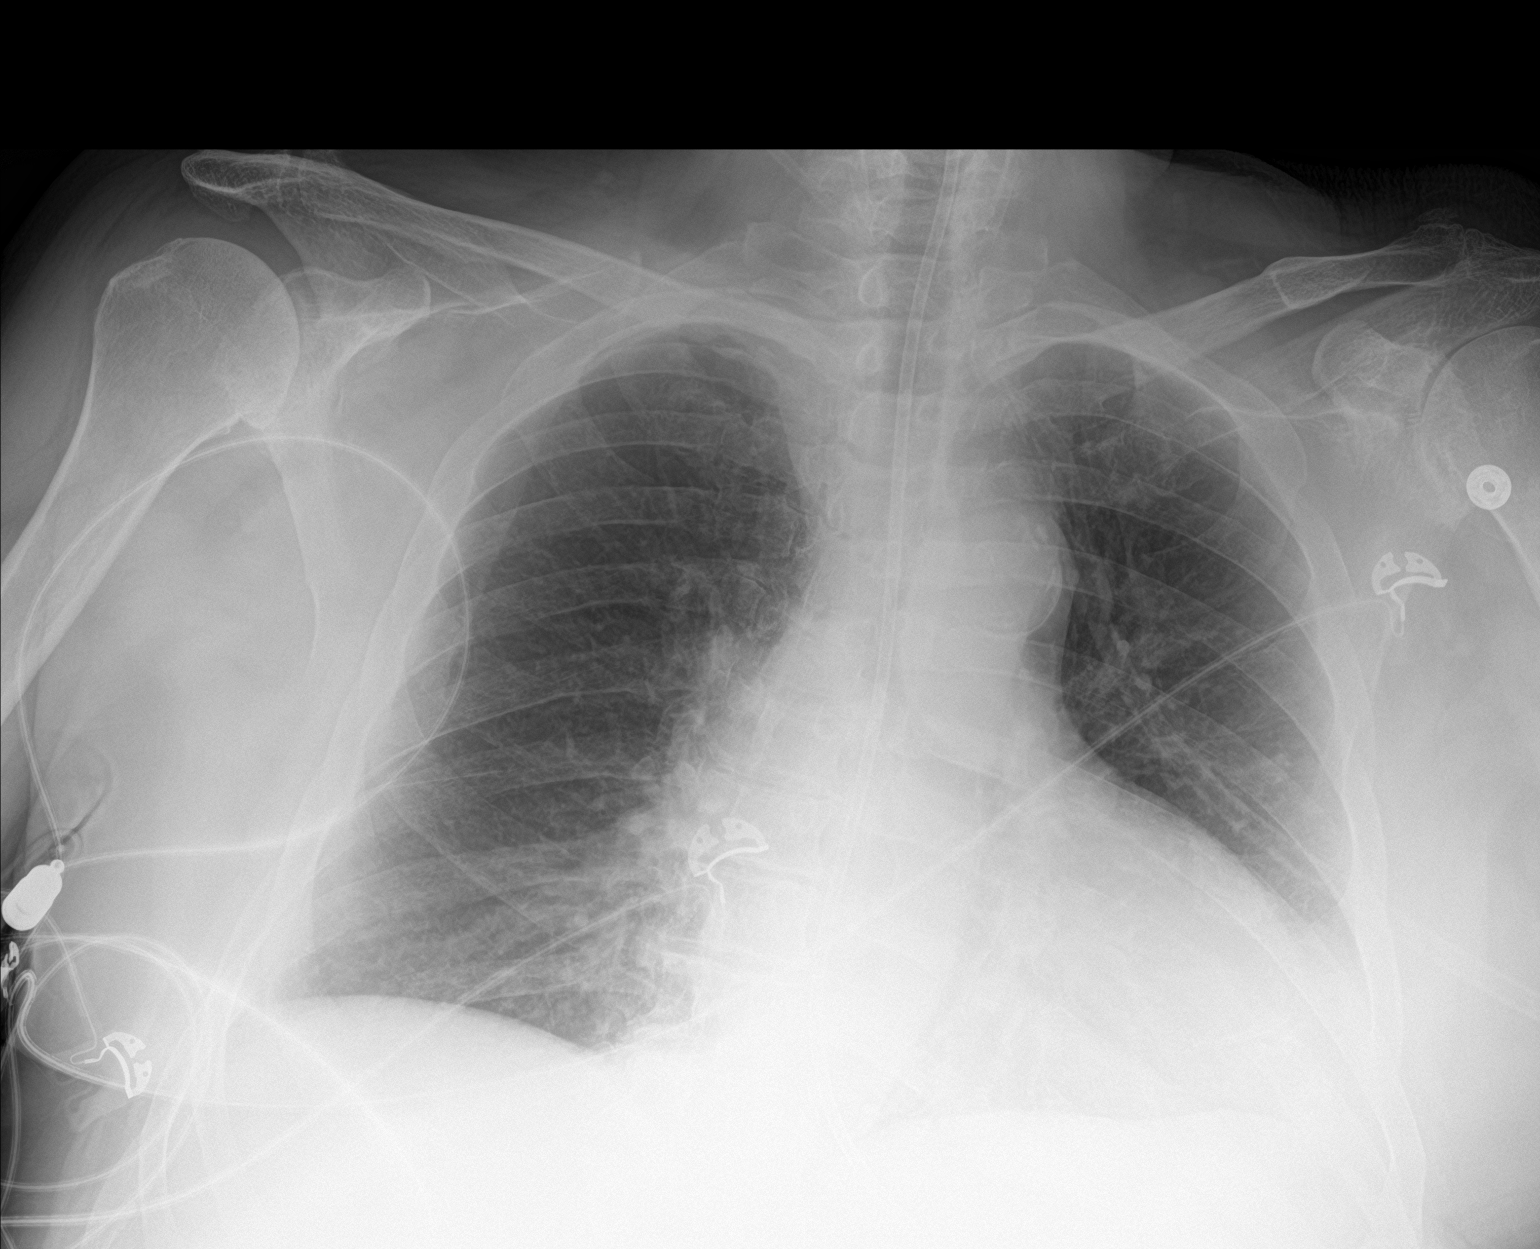

[2 of 2 positions shown; findings below may reference images not displayed]

FINDINGS: The heart is mildly enlarged. Mild tortuosity and calcification of
the thoracic aorta. Feeding tube is coursing down the esophagus and
into the stomach.

Streaky bibasilar atelectasis but no infiltrates, edema or
effusions.
IMPRESSION: Mild cardiac enlargement and streaky bibasilar atelectasis.

## 2022-07-22 DIAGNOSIS — Z794 Long term (current) use of insulin: Secondary | ICD-10-CM

## 2022-07-22 DIAGNOSIS — E872 Acidosis, unspecified: Secondary | ICD-10-CM | POA: Diagnosis not present

## 2022-07-22 DIAGNOSIS — R8281 Pyuria: Secondary | ICD-10-CM | POA: Diagnosis present

## 2022-07-22 DIAGNOSIS — M79601 Pain in right arm: Secondary | ICD-10-CM | POA: Diagnosis not present

## 2022-07-22 DIAGNOSIS — K56600 Partial intestinal obstruction, unspecified as to cause: Secondary | ICD-10-CM | POA: Diagnosis not present

## 2022-07-22 DIAGNOSIS — N1831 Chronic kidney disease, stage 3a: Secondary | ICD-10-CM | POA: Diagnosis present

## 2022-07-22 DIAGNOSIS — N179 Acute kidney failure, unspecified: Secondary | ICD-10-CM | POA: Diagnosis present

## 2022-07-22 DIAGNOSIS — E785 Hyperlipidemia, unspecified: Secondary | ICD-10-CM | POA: Diagnosis present

## 2022-07-22 DIAGNOSIS — Z79899 Other long term (current) drug therapy: Secondary | ICD-10-CM

## 2022-07-22 DIAGNOSIS — N132 Hydronephrosis with renal and ureteral calculous obstruction: Secondary | ICD-10-CM | POA: Diagnosis present

## 2022-07-22 DIAGNOSIS — N200 Calculus of kidney: Secondary | ICD-10-CM | POA: Diagnosis not present

## 2022-07-22 DIAGNOSIS — F17200 Nicotine dependence, unspecified, uncomplicated: Secondary | ICD-10-CM | POA: Diagnosis present

## 2022-07-22 DIAGNOSIS — Z6833 Body mass index (BMI) 33.0-33.9, adult: Secondary | ICD-10-CM

## 2022-07-22 DIAGNOSIS — D72829 Elevated white blood cell count, unspecified: Secondary | ICD-10-CM | POA: Diagnosis not present

## 2022-07-22 DIAGNOSIS — I129 Hypertensive chronic kidney disease with stage 1 through stage 4 chronic kidney disease, or unspecified chronic kidney disease: Secondary | ICD-10-CM | POA: Diagnosis present

## 2022-07-22 DIAGNOSIS — E1122 Type 2 diabetes mellitus with diabetic chronic kidney disease: Secondary | ICD-10-CM | POA: Diagnosis present

## 2022-07-22 DIAGNOSIS — F32A Depression, unspecified: Secondary | ICD-10-CM | POA: Diagnosis present

## 2022-07-22 DIAGNOSIS — Z7902 Long term (current) use of antithrombotics/antiplatelets: Secondary | ICD-10-CM

## 2022-07-22 DIAGNOSIS — Z1612 Extended spectrum beta lactamase (ESBL) resistance: Secondary | ICD-10-CM | POA: Diagnosis present

## 2022-07-22 DIAGNOSIS — K920 Hematemesis: Secondary | ICD-10-CM | POA: Diagnosis present

## 2022-07-22 DIAGNOSIS — I2510 Atherosclerotic heart disease of native coronary artery without angina pectoris: Secondary | ICD-10-CM | POA: Diagnosis present

## 2022-07-22 DIAGNOSIS — Z1152 Encounter for screening for COVID-19: Secondary | ICD-10-CM

## 2022-07-22 DIAGNOSIS — E870 Hyperosmolality and hypernatremia: Secondary | ICD-10-CM | POA: Diagnosis not present

## 2022-07-22 DIAGNOSIS — F329 Major depressive disorder, single episode, unspecified: Secondary | ICD-10-CM | POA: Diagnosis present

## 2022-07-22 DIAGNOSIS — Z87442 Personal history of urinary calculi: Secondary | ICD-10-CM

## 2022-07-22 DIAGNOSIS — K219 Gastro-esophageal reflux disease without esophagitis: Secondary | ICD-10-CM | POA: Diagnosis present

## 2022-07-22 DIAGNOSIS — E871 Hypo-osmolality and hyponatremia: Secondary | ICD-10-CM | POA: Diagnosis not present

## 2022-07-22 DIAGNOSIS — E669 Obesity, unspecified: Secondary | ICD-10-CM | POA: Diagnosis present

## 2022-07-22 DIAGNOSIS — B9620 Unspecified Escherichia coli [E. coli] as the cause of diseases classified elsewhere: Secondary | ICD-10-CM | POA: Diagnosis present

## 2022-07-22 DIAGNOSIS — Z7982 Long term (current) use of aspirin: Secondary | ICD-10-CM

## 2022-07-22 DIAGNOSIS — I959 Hypotension, unspecified: Secondary | ICD-10-CM | POA: Diagnosis present

## 2022-07-22 DIAGNOSIS — R066 Hiccough: Secondary | ICD-10-CM | POA: Diagnosis present

## 2022-07-22 DIAGNOSIS — E876 Hypokalemia: Secondary | ICD-10-CM | POA: Diagnosis present

## 2022-07-22 DIAGNOSIS — Z85828 Personal history of other malignant neoplasm of skin: Secondary | ICD-10-CM

## 2022-07-22 DIAGNOSIS — Z87891 Personal history of nicotine dependence: Secondary | ICD-10-CM

## 2022-07-22 DIAGNOSIS — I69354 Hemiplegia and hemiparesis following cerebral infarction affecting left non-dominant side: Secondary | ICD-10-CM

## 2022-07-22 DIAGNOSIS — Z8249 Family history of ischemic heart disease and other diseases of the circulatory system: Secondary | ICD-10-CM

## 2022-07-22 NOTE — ED Notes (Signed)
First Nurse Note Pt brought in by EMS with complaints of GI bleed, per EMS pt had one episode of emesis while in route coffee brown emesis. 159/92, 78, 95%  Pt takes 2 blood thinners. ADA and Plavix

## 2022-07-23 ENCOUNTER — Inpatient Hospital Stay
Admission: EM | Admit: 2022-07-23 | Discharge: 2022-08-07 | DRG: 988 | Disposition: A | Discharge status: Skilled Nursing Facility | Payer: Medicare Other | Source: Skilled Nursing Facility | Attending: Internal Medicine | Admitting: Internal Medicine

## 2022-07-23 ENCOUNTER — Emergency Department: Payer: Medicare Other

## 2022-07-23 ENCOUNTER — Encounter: Payer: Self-pay | Admitting: Emergency Medicine

## 2022-07-23 ENCOUNTER — Other Ambulatory Visit: Payer: Self-pay

## 2022-07-23 DIAGNOSIS — E119 Type 2 diabetes mellitus without complications: Secondary | ICD-10-CM

## 2022-07-23 DIAGNOSIS — K56609 Unspecified intestinal obstruction, unspecified as to partial versus complete obstruction: Secondary | ICD-10-CM

## 2022-07-23 DIAGNOSIS — E669 Obesity, unspecified: Secondary | ICD-10-CM | POA: Diagnosis present

## 2022-07-23 DIAGNOSIS — N179 Acute kidney failure, unspecified: Secondary | ICD-10-CM | POA: Diagnosis present

## 2022-07-23 DIAGNOSIS — I10 Essential (primary) hypertension: Secondary | ICD-10-CM | POA: Diagnosis present

## 2022-07-23 DIAGNOSIS — E870 Hyperosmolality and hypernatremia: Secondary | ICD-10-CM | POA: Insufficient documentation

## 2022-07-23 DIAGNOSIS — I639 Cerebral infarction, unspecified: Secondary | ICD-10-CM | POA: Diagnosis present

## 2022-07-23 DIAGNOSIS — N200 Calculus of kidney: Secondary | ICD-10-CM

## 2022-07-23 DIAGNOSIS — K920 Hematemesis: Secondary | ICD-10-CM | POA: Diagnosis not present

## 2022-07-23 DIAGNOSIS — K56600 Partial intestinal obstruction, unspecified as to cause: Secondary | ICD-10-CM

## 2022-07-23 DIAGNOSIS — R066 Hiccough: Secondary | ICD-10-CM | POA: Diagnosis not present

## 2022-07-23 DIAGNOSIS — Z22358 Carrier of other enterobacterales: Principal | ICD-10-CM

## 2022-07-23 DIAGNOSIS — E876 Hypokalemia: Secondary | ICD-10-CM | POA: Diagnosis not present

## 2022-07-23 HISTORY — DX: Chronic gingivitis, plaque induced: K05.10

## 2022-07-23 HISTORY — DX: Unspecified hemorrhoids: K64.9

## 2022-07-23 HISTORY — DX: Gout, unspecified: M10.9

## 2022-07-23 HISTORY — DX: Basal cell carcinoma of skin, unspecified: C44.91

## 2022-07-23 HISTORY — DX: Essential (primary) hypertension: I10

## 2022-07-23 HISTORY — DX: Major depressive disorder, single episode, unspecified: F32.9

## 2022-07-23 HISTORY — DX: Functional dyspepsia: K30

## 2022-07-23 HISTORY — DX: Hemiplegia, unspecified affecting unspecified side: G81.90

## 2022-07-23 HISTORY — DX: Constipation, unspecified: K59.00

## 2022-07-23 HISTORY — DX: Seborrhea capitis: L21.0

## 2022-07-23 HISTORY — DX: Coronary artery aneurysm: I25.41

## 2022-07-23 HISTORY — DX: Muscle weakness (generalized): M62.81

## 2022-07-23 HISTORY — DX: Unspecified intestinal obstruction, unspecified as to partial versus complete obstruction: K56.609

## 2022-07-23 LAB — URINALYSIS, ROUTINE W REFLEX MICROSCOPIC
Bilirubin Urine: NEGATIVE
Glucose, UA: NEGATIVE mg/dL
Hgb urine dipstick: NEGATIVE
Ketones, ur: NEGATIVE mg/dL
Nitrite: NEGATIVE
Protein, ur: 30 mg/dL — AB
Specific Gravity, Urine: 1.016 (ref 1.005–1.030)
pH: 5 (ref 5.0–8.0)

## 2022-07-23 LAB — LIPASE, BLOOD
Lipase: 29 U/L (ref 11–51)
Lipase: 33 U/L (ref 11–51)

## 2022-07-23 LAB — COMPREHENSIVE METABOLIC PANEL
ALT: 21 U/L (ref 0–44)
AST: 17 U/L (ref 15–41)
Albumin: 4.3 g/dL (ref 3.5–5.0)
Alkaline Phosphatase: 93 U/L (ref 38–126)
Anion gap: 13 (ref 5–15)
BUN: 48 mg/dL — ABNORMAL HIGH (ref 8–23)
CO2: 24 mmol/L (ref 22–32)
Calcium: 9.6 mg/dL (ref 8.9–10.3)
Chloride: 104 mmol/L (ref 98–111)
Creatinine, Ser: 2.12 mg/dL — ABNORMAL HIGH (ref 0.61–1.24)
GFR, Estimated: 33 mL/min — ABNORMAL LOW (ref >60)
Glucose, Bld: 158 mg/dL — ABNORMAL HIGH (ref 70–99)
Potassium: 3.5 mmol/L (ref 3.5–5.1)
Sodium: 141 mmol/L (ref 135–145)
Total Bilirubin: 1 mg/dL (ref 0.3–1.2)
Total Protein: 7.7 g/dL (ref 6.5–8.1)

## 2022-07-23 LAB — CBC
HCT: 44.3 % (ref 39.0–52.0)
Hemoglobin: 14 g/dL (ref 13.0–17.0)
MCH: 26.8 pg (ref 26.0–34.0)
MCHC: 31.6 g/dL (ref 30.0–36.0)
MCV: 84.7 fL (ref 80.0–100.0)
Platelets: 302 10*3/uL (ref 150–400)
RBC: 5.23 MIL/uL (ref 4.22–5.81)
RDW: 15.8 % — ABNORMAL HIGH (ref 11.5–15.5)
WBC: 15 10*3/uL — ABNORMAL HIGH (ref 4.0–10.5)
nRBC: 0 % (ref 0.0–0.2)

## 2022-07-23 LAB — GLUCOSE, CAPILLARY
Glucose-Capillary: 139 mg/dL — ABNORMAL HIGH (ref 70–99)
Glucose-Capillary: 115 mg/dL — ABNORMAL HIGH (ref 70–99)

## 2022-07-23 LAB — TYPE AND SCREEN
ABO/RH(D): A POS
Antibody Screen: NEGATIVE

## 2022-07-23 LAB — TROPONIN I (HIGH SENSITIVITY)
Troponin I (High Sensitivity): 20 ng/L — ABNORMAL HIGH (ref <18)
Troponin I (High Sensitivity): 21 ng/L — ABNORMAL HIGH (ref <18)

## 2022-07-23 LAB — MAGNESIUM: Magnesium: 2.1 mg/dL (ref 1.7–2.4)

## 2022-07-23 LAB — PROTIME-INR
INR: 1.1 (ref 0.8–1.2)
Prothrombin Time: 14.3 seconds (ref 11.4–15.2)

## 2022-07-23 LAB — HEMOGLOBIN A1C
Hgb A1c MFr Bld: 6.2 % — ABNORMAL HIGH (ref 4.8–5.6)
Mean Plasma Glucose: 131.24 mg/dL

## 2022-07-23 LAB — SARS CORONAVIRUS 2 BY RT PCR: SARS Coronavirus 2 by RT PCR: NEGATIVE

## 2022-07-23 MED ORDER — ENOXAPARIN SODIUM 40 MG/0.4ML IJ SOSY: 40.0000 mg | PREFILLED_SYRINGE | INTRAMUSCULAR | Status: DC

## 2022-07-23 MED ORDER — ATORVASTATIN CALCIUM 20 MG PO TABS
40.0000 mg | ORAL_TABLET | Freq: Every evening | ORAL | Status: DC
  Administered 2022-07-23 – 2022-08-07 (×12): 40 mg via ORAL
  Filled 2022-07-23 (×14): qty 2

## 2022-07-23 MED ORDER — ACETAMINOPHEN 650 MG RE SUPP: 650.0000 mg | Freq: Four times a day (QID) | RECTAL | Status: DC | PRN

## 2022-07-23 MED ORDER — POLYETHYLENE GLYCOL 3350 17 G PO PACK: 17.0000 g | PACK | Freq: Every day | ORAL | Status: DC | PRN

## 2022-07-23 MED ORDER — SODIUM CHLORIDE 0.9% FLUSH
3.0000 mL | Freq: Two times a day (BID) | INTRAVENOUS | Status: DC
  Administered 2022-07-23 – 2022-08-07 (×22): 3 mL via INTRAVENOUS

## 2022-07-23 MED ORDER — SODIUM CHLORIDE 0.9 % IV BOLUS
500.0000 mL | Freq: Once | INTRAVENOUS | Status: AC
  Administered 2022-07-23: 500 mL via INTRAVENOUS

## 2022-07-23 MED ORDER — ONDANSETRON HCL 4 MG/2ML IJ SOLN: 4.0000 mg | Freq: Once | INTRAMUSCULAR | Status: DC

## 2022-07-23 MED ORDER — ACETAMINOPHEN 325 MG PO TABS
650.0000 mg | ORAL_TABLET | Freq: Four times a day (QID) | ORAL | Status: DC | PRN
  Administered 2022-07-30 – 2022-08-07 (×9): 650 mg via ORAL
  Filled 2022-07-23 (×9): qty 2

## 2022-07-23 MED ORDER — ENOXAPARIN SODIUM 60 MG/0.6ML IJ SOSY
0.5000 mg/kg | PREFILLED_SYRINGE | INTRAMUSCULAR | Status: DC
  Administered 2022-07-23 – 2022-08-07 (×15): 50 mg via SUBCUTANEOUS
  Filled 2022-07-23 (×15): qty 0.6

## 2022-07-23 MED ORDER — PANTOPRAZOLE SODIUM 40 MG IV SOLR
40.0000 mg | Freq: Once | INTRAVENOUS | Status: AC
  Administered 2022-07-23: 40 mg via INTRAVENOUS
  Filled 2022-07-23: qty 10

## 2022-07-23 MED ORDER — ONDANSETRON HCL 4 MG/2ML IJ SOLN
4.0000 mg | Freq: Four times a day (QID) | INTRAMUSCULAR | Status: DC | PRN
  Administered 2022-07-24 – 2022-08-03 (×9): 4 mg via INTRAVENOUS
  Filled 2022-07-23 (×9): qty 2

## 2022-07-23 MED ORDER — ALUM & MAG HYDROXIDE-SIMETH 200-200-20 MG/5ML PO SUSP
30.0000 mL | Freq: Once | ORAL | Status: AC
  Administered 2022-07-23: 30 mL via ORAL
  Filled 2022-07-23: qty 30

## 2022-07-23 MED ORDER — ASPIRIN 81 MG PO CHEW
81.0000 mg | CHEWABLE_TABLET | Freq: Every day | ORAL | Status: DC
  Administered 2022-07-23 – 2022-08-07 (×11): 81 mg via ORAL
  Filled 2022-07-23 (×12): qty 1

## 2022-07-23 MED ORDER — LACTATED RINGERS IV SOLN: INTRAVENOUS | Status: DC

## 2022-07-23 MED ORDER — INSULIN ASPART 100 UNIT/ML IJ SOLN
0.0000 [IU] | Freq: Three times a day (TID) | INTRAMUSCULAR | Status: DC
  Administered 2022-07-23 – 2022-07-30 (×10): 2 [IU] via SUBCUTANEOUS
  Administered 2022-07-31: 3 [IU] via SUBCUTANEOUS
  Administered 2022-07-31: 2 [IU] via SUBCUTANEOUS
  Administered 2022-08-01 (×2): 3 [IU] via SUBCUTANEOUS
  Filled 2022-07-23 (×14): qty 1

## 2022-07-23 MED ORDER — CLOPIDOGREL BISULFATE 75 MG PO TABS
75.0000 mg | ORAL_TABLET | Freq: Every day | ORAL | Status: DC
  Administered 2022-07-23 – 2022-08-07 (×11): 75 mg via ORAL
  Filled 2022-07-23 (×12): qty 1

## 2022-07-23 MED ORDER — LACTATED RINGERS IV SOLN: INTRAVENOUS | Status: AC

## 2022-07-23 MED ORDER — ONDANSETRON HCL 4 MG PO TABS
4.0000 mg | ORAL_TABLET | Freq: Four times a day (QID) | ORAL | Status: DC | PRN
  Administered 2022-08-03: 4 mg via ORAL
  Filled 2022-07-23: qty 1

## 2022-07-23 MED ORDER — POTASSIUM CHLORIDE CRYS ER 20 MEQ PO TBCR
40.0000 meq | EXTENDED_RELEASE_TABLET | Freq: Once | ORAL | Status: AC
  Administered 2022-07-23: 40 meq via ORAL
  Filled 2022-07-23: qty 2

## 2022-07-23 NOTE — Consult Note (Signed)
Brief consult note  Chart and imaging reviewed.  71 y.o. male admitted for possible small bowel obstruction.  CT scan showed moderate-severe left hydronephrosis and hydroureter secondary to a mid/lower proximal ureteral calculus.  Patient had a normal abdominal x-ray performed in July 2021.  Intravenous contrast had been administered prior to this x-ray and he was noted to have moderate-severe hydronephrosis/hydroureter to the lower proximal/mid ureter similar to today's CT.    Most likely chronic left hydronephrosis secondary to a ureteral calculus.  He is afebrile.  Urine does show most likely chronic.  No indication for stenting at this time.  Consider ureteral stenting and follow-up ureteroscopy for definitive stone management.  Full consult to follow.    Miklo Giovanni, MD

## 2022-07-23 NOTE — Assessment & Plan Note (Addendum)
Patient reports 1 episode of brown emesis with no coffee grounds or bright red blood.

## 2022-07-23 NOTE — Assessment & Plan Note (Signed)
Patient's blood pressure was initially low on admission and is currently receiving IV fluids.  Will hold home antihypertensives and expect to restart tomorrow.  - Holding home labetalol due to hypotension - Holding home lisinopril due to hypotension and AKI

## 2022-07-23 NOTE — Assessment & Plan Note (Signed)
-   Holding home oral antiglycemic therapy, CBG stable with sliding scale

## 2022-07-23 NOTE — ED Provider Notes (Signed)
[facility] Provider Note    Event Date/Time   First MD Initiated Contact with Patient 07/23/22 [phone]     (approximate)   History   Hematemesis   HPI  Rodney Estrada is a 71 y.o. male with history of carotid atherosclerosis on aspirin and Plavix, diabetes, hypertension who comes in with concerns for hematemesis.  Patient had 1 episode of coffee-ground emesis that happened this morning.  Patient reports living at [facility] due to a prior stroke where is not able to move his left side.  He reports having a lot of acid reflux and nausea and had 1 episode of brown emesis this morning.  He denies ever having a GI bleed previously.  Denies any black tarry stool.  He denies any shortness of breath or other concerns.  Review of records patient was seen by cardiology on 09/27/2021 did confirm patient was on aspirin and Plavix.  Physical Exam   Triage Vital Signs: ED Triage Vitals  Enc Vitals Group     BP 07/23/22 0024 (!) 114/99     Pulse Rate 07/23/22 0024 77     Resp 07/23/22 0024 18     Temp 07/23/22 0024 98.9 F (37.2 C)     Temp Source 07/23/22 0024 Oral     SpO2 07/23/22 0024 93 %     Weight 07/23/22 0020 225 lb (102.1 kg)     Height 07/23/22 0020 '5\' 9"'$  (1.753 m)     Head Circumference --      Peak Flow --      Pain Score 07/23/22 0020 0     Pain Loc --      Pain Edu? --      Excl. in [place]? --     Most recent vital signs: Vitals:   07/23/22 0024 07/23/22 0349  BP: (!) 114/99 106/62  Pulse: 77 71  Resp: 18 18  Temp: 98.9 F (37.2 C) 98.9 F (37.2 C)  SpO2: 93% 94%     General: Awake, no distress.  CV:  Good peripheral perfusion.  Resp:  Normal effort.  Abd:  No distention.  Soft and nontender Other:  Patient has baseline left-sided inability to move [employer] stool, Hemoccult negative  ED Results / Procedures / Treatments   Labs (all labs ordered are listed, but only abnormal results are displayed) Labs Reviewed  CBC - Abnormal; Notable for  the following components:      Result Value   WBC 15.0 (*)    RDW 15.8 (*)    All other components within normal limits  COMPREHENSIVE METABOLIC PANEL - Abnormal; Notable for the following components:   Glucose, Bld 158 (*)    BUN 48 (*)    Creatinine, Ser 2.12 (*)    GFR, Estimated 33 (*)    All other components within normal limits  PROTIME-INR  LIPASE, BLOOD  LIPASE, BLOOD  POC OCCULT BLOOD, ED  TYPE AND SCREEN     EKG  My interpretation of EKG:  Normal sinus rate of 77 without any ST elevation or T wave inversions except for aVL with QTc of 509  Sinus rate of 63 without any ST elevation or T wave inversions except for aVL with QTc of 478  RADIOLOGY I have reviewed the xray personally and no PNA    PROCEDURES:  Critical Care performed: No  .1-3 Lead EKG Interpretation  Performed by: Vanessa [place], MD Authorized by: Vanessa [place], MD     Interpretation: normal  ECG rate:  60   ECG rate assessment: normal     Rhythm: sinus rhythm     Ectopy: none     Conduction: normal      MEDICATIONS ORDERED IN ED: Medications  pantoprazole (PROTONIX) injection 40 mg (40 mg Intravenous Given 07/23/22 0909)  sodium chloride 0.9 % bolus 500 mL (0 mLs Intravenous Stopped 07/23/22 1039)  alum & mag hydroxide-simeth (MAALOX/MYLANTA) 200-200-20 MG/5ML suspension 30 mL (30 mLs Oral Given 07/23/22 0909)     IMPRESSION / MDM / ASSESSMENT AND PLAN / ED COURSE  I reviewed the triage vital signs and the nursing notes.   Patient's presentation is most consistent with acute presentation with potential threat to life or bodily function.   Differential includes GI bleed, acid reflux, will get chest x-ray to make sure no aspiration.  COVID test.  Will get troponins to make sure no signs of ACS that could be just causing him to feel like he is having acid reflux.  Abdomen is soft and nontender seems low suspicion for acute abdominal pathology.  Will treat for gastritis give some  fluids and some acid reducer.  Initial EKG showed slightly prolonged QTc but his magnesium was normal and upon repeat EKG it was normal.  Hemoglobin is stable at 14.  White count is slightly elevated.  CMP shows elevated creatinine and elevated BUN from baseline.  Lipase is normal.  Discuss with [facility] they reported that there was 1 episode of vomiting that they thought was dark brown in nature.  None of them actually solid and just stated that he is very verbal and that he said that he thought it was blood which is why they brought him in to be evaluated.  Troponins are stable from 2021 and he is got a little AKI but no recent checks in the past 2 years so this could just be his baseline.  His chest x-ray showed some bronchial thickening but he denies any shortness of breath or coughing he has no wheezing on exam to suggest bronchitis.  I considered admission due to the concern for an episode of possible vomiting blood but on examination patient is got brown stool Hemoccult negative and this was done 10 hours after this initial episode given he had been in the waiting room for a very long time prior to being seen.    However every time I go into the room he continues to feel some upper abdominal discomfort and bloating sensation.  Will get CT imaging given patient's age to make sure is no other issues.  CT concerning for possible small bowel obstruction.  He was able to tolerate some applesauce so he does have 1 is very early.  Discussed with surgery who will come and see the patient but at this time seems unlikely to be surgical in nature and will admit to the medicine team.  Also for his kidney stone I told him that he needs an outpatient MRI and he expressed understanding.  Also sent a message to urology to follow-up with.  We are working on getting a urine to make sure no UTI but I did see back in 2021 he had 8 abdominal x-rays that showed hydro so I wonder if this is baseline for him.  We  will discuss with hospital team for admission   The patient is on the cardiac monitor to evaluate for evidence of arrhythmia and/or significant heart rate changes.      FINAL CLINICAL IMPRESSION(S) / ED DIAGNOSES  Final diagnoses:  Partial intestinal obstruction, unspecified cause ([place])  Kidney stone  AKI (acute kidney injury) ([place])     Rx / DC Orders   ED Discharge Orders     None        Note:  This document was prepared using Dragon voice recognition software and may include unintentional dictation errors.   Vanessa [place], MD 07/23/22 [phone]

## 2022-07-23 NOTE — Assessment & Plan Note (Addendum)
Initially looks to have resolved, now recurrent.  NPO.  NG tube placed by interventional radiology on morning of 11/3.  General surgery to follow-up if no improvement by 11/4.

## 2022-07-23 NOTE — Assessment & Plan Note (Addendum)
Patient has a history of nephrolithiasis with CT imaging notable for large calculus with moderate proximal left hydroureteronephrosis.  Urology feels that this is chronic, but stent placement would benefit remaining renal function in that kidney.  To be done once SBO resolves

## 2022-07-23 NOTE — H&P (Signed)
History and Physical    Patient: Rodney Estrada EUM:[id number] DOB: [birth year] DOA: 07/23/2022 DOS: the patient was seen and examined on 07/23/2022 PCP: Pcp, No  Patient coming from: ALF/ILF, [facility]  Chief Complaint:  Chief Complaint  Patient presents with   Hematemesis   HPI: Rodney Estrada is a 71 y.o. male with medical history significant of prior CVA with residual left hemiparesis, hypertension, CAD, depression who presents to the ED with complaints of brown emesis.  Rodney Estrada states he had 1 episode of brown emesis yesterday after eating lunch.  He describes the emesis as a light brown with no coffee grounds or bright red blood.  He has not had any additional episodes of emesis since then, but is experiencing significant acid reflux and nausea.  He denies any prior history of GI bleed.  He denies any abdominal pain.  He is unable to recall the last time he had a bowel movement, but states he is passing gas.  He denies any prior abdominal surgery or prior history of bowel obstruction.  Rodney Estrada states that he has a history of kidney stones.  He denies any urinary symptoms at this time including dysuria, or hematuria.  ED course: On arrival to the ED, patient was initially hypotensive at 95/49 with improvement up to 110/60.  He was afebrile at 98.4.  Heart rate 64.  Initial work-up remarkable for creatinine of 2.12, BUN of 48.  FOBT negative.  CT abdomen/pelvis was obtained remarkable for early or partial SBO in addition to a large calculus with left hydroureteronephrosis.  General surgery and urology contacted by ED provider and will evaluate.  [facility] contacted for admission.  Review of Systems: As mentioned in the history of present illness. All other systems reviewed and are negative.  Past Medical History:  Diagnosis Date   Basal cell carcinoma    Chronic gingivitis    Constipation    Coronary artery aneurysm    Functional dyspepsia    Gout    Hemiparesis (HCC)    Hemiplegia  (HCC)    Hemorrhoids    Hypertension    Major depressive disorder    Muscle weakness    Seborrhea capitis    Past Surgical History:  Procedure Laterality Date   IR CT HEAD LTD  04/05/2020   IR INTRA CRAN STENT  04/05/2020   IR PERCUTANEOUS ART THROMBECTOMY/INFUSION INTRACRANIAL INC DIAG ANGIO  04/05/2020       IR PERCUTANEOUS ART THROMBECTOMY/INFUSION INTRACRANIAL INC DIAG ANGIO  04/05/2020   RADIOLOGY WITH ANESTHESIA N/A 04/05/2020   Procedure: IR WITH ANESTHESIA;  Surgeon: Radiologist, Medication, MD;  Location: [place];  Service: Radiology;  Laterality: N/A;   Social History:  reports that he has been smoking. He has never used smokeless tobacco. He reports current alcohol use of about 10.0 standard drinks of alcohol per week. He reports that he does not currently use drugs.  No Known Allergies  Family History  Problem Relation Age of Onset   Hypertension Mother    Hypertension Father     Prior to Admission medications   Medication Sig Start Date End Date Taking? Authorizing Provider  aspirin 81 MG chewable tablet Chew 1 tablet (81 mg total) by mouth daily. 04/16/20   Donzetta Starch, NP  atorvastatin (LIPITOR) 80 MG tablet Take 1 tablet (80 mg total) by mouth daily. 04/16/20   Donzetta Starch, NP  clopidogrel (PLAVIX) 75 MG tablet Take 1 tablet (75 mg total) by mouth daily. 04/19/20   Biby,  Massie Kluver, NP  diphenhydrAMINE (BENADRYL) 25 mg capsule Take 1 capsule (25 mg total) by mouth every 6 (six) hours as needed for itching. 04/16/20   Donzetta Starch, NP  insulin aspart (NOVOLOG) 100 UNIT/ML injection Inject 0-15 Units into the skin every 4 (four) hours. 04/16/20   Donzetta Starch, NP  insulin detemir (LEVEMIR) 100 UNIT/ML injection Inject 0.08 mLs (8 Units total) into the skin 2 (two) times daily. 04/16/20   Donzetta Starch, NP  labetalol (NORMODYNE) 100 MG tablet Take 1 tablet (100 mg total) by mouth 3 (three) times daily. 04/19/20   Donzetta Starch, NP  lisinopril (ZESTRIL) 20 MG tablet Take 1  tablet (20 mg total) by mouth daily. 04/16/20   Donzetta Starch, NP  Maltodextrin-Xanthan Gum (RESOURCE THICKENUP CLEAR) POWD Take 120 g by mouth as needed (honey thick liquids). 04/16/20   Donzetta Starch, NP  nicotine (NICODERM CQ - DOSED IN MG/24 HOURS) 14 mg/24hr patch Place 1 patch (14 mg total) onto the skin daily. 04/16/20   Donzetta Starch, NP    Physical Exam: Vitals:   07/23/22 1200 07/23/22 1230 07/23/22 1300 07/23/22 1330  BP: 121/66 128/71 132/73 137/68  Pulse: 63 (!) 58 62 63  Resp: 14     Temp:      TempSrc:      SpO2: 95% 98% 94% 93%  Weight:      Height:       Physical Exam Vitals and nursing note reviewed.  Constitutional:      General: He is not in acute distress.    Appearance: He is obese. He is not toxic-appearing.  HENT:     Head: Normocephalic and atraumatic.     Mouth/Throat:     Mouth: Mucous membranes are moist.     Pharynx: Oropharynx is clear.  Cardiovascular:     Rate and Rhythm: Normal rate and regular rhythm.     Heart sounds: No murmur heard.    No gallop.  Pulmonary:     Effort: Pulmonary effort is normal. No respiratory distress.     Breath sounds: Normal breath sounds.  Abdominal:     General: Bowel sounds are decreased.     Palpations: Abdomen is soft.     Tenderness: There is abdominal tenderness (On palpation) in the right upper quadrant, epigastric area and left upper quadrant. There is no right CVA tenderness, left CVA tenderness or guarding.     Comments: Bowel sounds only auscultated in the epigastric and right upper quadrant regions.  Musculoskeletal:     Right lower leg: No edema.     Left lower leg: No edema.  Skin:    General: Skin is warm and dry.  Neurological:     Mental Status: He is alert.     Sensory: Sensation is intact.     Comments:  Patient is alert and oriented to person, situation and year.  He does believe we are at [facility] in [place] though.  Moving right upper and lower independently.  0/5 strength of left  upper and lower extremity (chronic).  Psychiatric:        Mood and Affect: Affect is blunt.    Data Reviewed: CBC with white blood count elevated at 15.  CMP with glucose 158, BUN of 48, creatinine of 2.12.  Magnesium, lipase, PT/INR within normal limits.  Troponin elevated at 20 with flat trend to 21.  Urinalysis with proteinuria, moderate leukocytes, rare bacteria.  COVID-19 PCR negative.  EKG personally reviewed.  Notable for sinus bradycardia with rate of 63 prolonged PR interval noted.  No acute ST or T wave changes to suggest acute ischemia.  CT ABDOMEN PELVIS WO CONTRAST  Result Date: 07/23/2022 CLINICAL DATA:  71 year old male with history of abdominal pain. Emesis. EXAM: CT ABDOMEN AND PELVIS WITHOUT CONTRAST TECHNIQUE: Multidetector CT imaging of the abdomen and pelvis was performed following the standard protocol without IV contrast. RADIATION DOSE REDUCTION: This exam was performed according to the departmental dose-optimization program which includes automated exposure control, adjustment of the mA and/or kV according to patient size and/or use of iterative reconstruction technique. COMPARISON:  No priors. FINDINGS: Lower chest: Scattered areas of mild cylindrical bronchiectasis with what appear to be regions of mucoid impaction in the lung bases bilaterally, most evident in the left lower lobe. Atherosclerotic calcifications in the descending thoracic aorta as well as the left main, left anterior descending, left circumflex and right coronary arteries. Mild calcifications of the aortic valve. Hepatobiliary: No definite suspicious cystic or solid hepatic lesions are confidently identified on today's noncontrast CT examination. Unenhanced appearance of the gallbladder is unremarkable. Pancreas: No definite pancreatic mass or peripancreatic fluid collections or inflammatory changes are noted on today's noncontrast CT examination. Spleen: Unremarkable. 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 In the middle third of the left ureter (axial image 61 of series 2 and coronal image 70 of series 5) there is a 0.8 x 0.9 x 1.4 cm calculus which is associated with moderate proximal left hydroureteronephrosis. Additional ill-defined calcifications are noted in the left renal collecting system, potentially additional nonobstructive calculi, although the possibility of a partially calcified mass is not excluded, particularly in the lower pole where the calcifications are very ill-defined. Right kidney and bilateral adrenal glands are normal in appearance. No right hydroureteronephrosis. Urinary bladder is unremarkable in appearance. Stomach/Bowel: The appearance of the stomach is normal. There are several mildly dilated loops of mid small bowel measuring up to 4.4 cm in diameter, with multiple air-fluid levels. Distal small bowel is completely decompressed, with transition from dilated to nondilated loops of small bowel in the left side of the abdomen is, presumably from adhesions. There is some gas and stool noted throughout the colon, although the colon is relatively decompressed as well. Vascular/Lymphatic: Atherosclerotic calcifications throughout the abdominal aorta and pelvic vasculature. No lymphadenopathy noted in the abdomen or pelvis. Reproductive: Prostate gland and seminal vesicles are unremarkable in appearance. Other: No significant volume of ascites.  No pneumoperitoneum. Musculoskeletal: There are no aggressive appearing lytic or blastic lesions noted in the visualized portions of the skeleton. IMPRESSION: 1. Findings are concerning for early or partial small bowel obstruction. Surgical consultation is recommended. 2. There is also a large calculus measuring 8 x 9 x 14 mm in the middle third of the left ureter with moderate proximal left hydroureteronephrosis indicating obstruction. Multiple other poorly defined calcifications are  present within the left kidney, potentially nonobstructive calculi. However, the possibility of a partially calcified left renal mass is not excluded. Further evaluation with nonemergent abdominal MRI with and without IV gadolinium should be considered in the near future after resolution of the patient's acute illness to better evaluate these findings and exclude malignancy. Urologic consultation is also suggested. 3. Aortic atherosclerosis, in addition to left main and three-vessel coronary artery disease. Please note that although the presence of coronary artery calcium documents the presence of coronary artery disease, the severity of  this disease and any potential stenosis cannot be assessed on this non-gated CT examination. Assessment for potential risk factor modification, dietary therapy or pharmacologic therapy may be warranted, if clinically indicated. 4. Additional incidental findings, as above. Electronically Signed   By: Vinnie Langton M.D.   On: 07/23/2022 13:08   DG Chest Portable 1 View  Result Date: 07/23/2022 CLINICAL DATA:  71 year old male with history of shortness of breath. EXAM: PORTABLE CHEST 1 VIEW COMPARISON:  Chest x-ray 04/12/2020. FINDINGS: Lung volumes are low. Diffuse peribronchial cuffing. No consolidative airspace disease. No pleural effusions. No pneumothorax. No pulmonary nodule or mass noted. Pulmonary vasculature and the cardiomediastinal silhouette are within normal limits. Atherosclerotic calcifications are noted in the thoracic aorta. IMPRESSION: 1. Diffuse peribronchial cuffing. Findings may suggest an acute bronchitis. 2. Aortic atherosclerosis. Electronically Signed   By: Vinnie Langton M.D.   On: 07/23/2022 09:01    There are no new results to review at this time.  Assessment and Plan: * SBO (small bowel obstruction) (HCC) Patient presenting with 1 episode of emesis 1 day ago, heartburn and persistent epigastric discomfort now with findings consistent with  partial or early SBO on CT imaging.  No bowel movements within the last 24 hours, but passing flatus.  - General surgery consulted; appreciate their recommendations - Hold off on NG tube given no additional emesis - N.p.o. until general surgery evaluation - Maintain potassium above 4 and magnesium above 2  Nephrolithiasis Patient has a history of nephrolithiasis with CT imaging notable for large calculus with moderate proximal left hydroureteronephrosis.  Unclear if this is chronic, however given AKI, urology has been consulted.  - Urology following; appreciate their recommendations - Urine culture obtained  AKI (acute kidney injury) ([place]) Likely multifactorial in the setting of poor p.o. intake after emesis yesterday in obstructive nephrolithiasis.  - IV fluids - Urology consulted for nephrolithiasis - Repeat BMP in a.m. - Hold any nephrotoxic agents  Dark emesis Patient reports 1 episode of brown emesis with no coffee grounds or bright red blood.  FOBT negative, so highly unlikely this was a GI bleed.  In addition, symptoms have resolved.  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  Will hold home antihypertensives and expect to restart tomorrow.  - Holding home labetalol due to hypotension - Holding home lisinopril due to hypotension and AKI  Type 2 diabetes mellitus ([place]) - Holding home oral antiglycemic therapy - SSI, moderate   Advance Care Planning:   Code Status: Full Code   Consults: General Surgery, Urology  Family Communication: No family at bedside.  Severity of Illness: The appropriate patient status for this patient is OBSERVATION. Observation status is judged to be reasonable and necessary in order to provide  the required intensity of service to ensure the patient's safety. The patient's presenting symptoms, physical exam findings, and initial radiographic and laboratory data in the context of their medical condition is felt to place them at decreased risk for further clinical deterioration. Furthermore, it is anticipated that the patient will be medically stable for discharge from the hospital within 2 midnights of admission.   Author: Jose Persia, MD 07/23/2022 2:56 PM  For on call review [email or web address].

## 2022-07-23 NOTE — Consult Note (Signed)
[name] (initial) - cpt: 81017   HISTORY OF PRESENT ILLNESS (HPI):  71 y.o. male presented to [facility] ED today for evaluation of emesis, some reports of yellow-green emesis, another report of coffee-ground.  Patient takes antiplatelet agent.   patient reports no abdominal pain, just the vomiting, denies any back pain, fevers or chills.  He denies any cramping sensation.  I believe his history is somewhat limited.  Surgery is consulted by ER physician Dr. Jari Pigg in this context for evaluation and management of possible small bowel obstruction as noted on CT scan.[name]  PAST MEDICAL HISTORY (PMH):  Past Medical History:  Diagnosis Date   Basal cell carcinoma    Chronic gingivitis    Constipation    Coronary artery aneurysm    Functional dyspepsia    Gout    Hemiparesis ([place])    Hemiplegia ([place])    Hemorrhoids    Hypertension    Major depressive disorder    Muscle weakness    Seborrhea capitis      PAST SURGICAL HISTORY ([place]):  Past Surgical History:  Procedure Laterality Date   IR CT HEAD LTD  04/05/2020   IR INTRA CRAN STENT  04/05/2020   IR PERCUTANEOUS ART THROMBECTOMY/INFUSION INTRACRANIAL INC DIAG ANGIO  04/05/2020       IR PERCUTANEOUS ART THROMBECTOMY/INFUSION INTRACRANIAL INC DIAG ANGIO  04/05/2020   RADIOLOGY WITH ANESTHESIA N/A 04/05/2020   Procedure: IR WITH ANESTHESIA;  Surgeon: Radiologist, Medication, MD;  Location: [facility];  Service: Radiology;  Laterality: N/A;     MEDICATIONS:  Prior to Admission medications   Medication Sig Start Date End Date Taking? Authorizing Provider  aspirin 81 MG chewable tablet Chew 1 tablet (81 mg total) by mouth daily. 04/16/20   Donzetta Starch, NP  atorvastatin (LIPITOR) 80 MG tablet Take 1 tablet (80 mg total) by mouth daily. 04/16/20   Donzetta Starch, NP  clopidogrel (PLAVIX) 75 MG tablet Take 1 tablet (75 mg total) by mouth daily. 04/19/20   Donzetta Starch, NP  diphenhydrAMINE (BENADRYL) 25 mg  capsule Take 1 capsule (25 mg total) by mouth every 6 (six) hours as needed for itching. 04/16/20   Donzetta Starch, NP  insulin aspart (NOVOLOG) 100 UNIT/ML injection Inject 0-15 Units into the skin every 4 (four) hours. 04/16/20   Donzetta Starch, NP  insulin detemir (LEVEMIR) 100 UNIT/ML injection Inject 0.08 mLs (8 Units total) into the skin 2 (two) times daily. 04/16/20   Donzetta Starch, NP  labetalol (NORMODYNE) 100 MG tablet Take 1 tablet (100 mg total) by mouth 3 (three) times daily. 04/19/20   Donzetta Starch, NP  lisinopril (ZESTRIL) 20 MG tablet Take 1 tablet (20 mg total) by mouth daily. 04/16/20   Donzetta Starch, NP  Maltodextrin-Xanthan Gum (RESOURCE THICKENUP CLEAR) POWD Take 120 g by mouth as needed (honey thick liquids). 04/16/20   Donzetta Starch, NP  nicotine (NICODERM CQ - DOSED IN MG/24 HOURS) 14 mg/24hr patch Place 1 patch (14 mg total) onto the skin daily. 04/16/20   Donzetta Starch, NP     ALLERGIES:  No Known Allergies   SOCIAL HISTORY:  Social History   Socioeconomic History   Marital status: Unknown    Spouse name: Not on file   Number of children: Not on file   Years of education: Not on file   Highest education level: Not on file  Occupational History   Not on file  Tobacco  Use   Smoking status: Every Day   Smokeless tobacco: Never  Substance and Sexual Activity   Alcohol use: Yes    Alcohol/week: 10.0 standard drinks of alcohol    Types: 10 Cans of beer per week    Comment: every DAy   Drug use: Not Currently   Sexual activity: Not on file  Other Topics Concern   Not on file  Social History Narrative   Not on file   Social Determinants of Health   Financial Resource Strain: Not on file  Food Insecurity: Not on file  Transportation Needs: Not on file  Physical Activity: Not on file  Stress: Not on file  Social Connections: Not on file  Intimate Partner Violence: Not on file     FAMILY HISTORY:  Family History  Problem Relation Age of Onset    Hypertension Mother    Hypertension Father       REVIEW OF SYSTEMS:  Review of Systems  Unable to perform ROS: Mental acuity    VITAL SIGNS:  Temp:  [98.4 F (36.9 C)-98.9 F (37.2 C)] 98.4 F (36.9 C) (10/29 0936) Pulse Rate:  [58-77] 63 (10/29 1330) Resp:  [12-20] 14 (10/29 1200) BP: (95-137)/(49-99) 137/68 (10/29 1330) SpO2:  [93 %-98 %] 93 % (10/29 1330) Weight:  [102.1 kg] 102.1 kg (10/29 0020)     Height: '5\' 9"'$  (175.3 cm) Weight: 102.1 kg BMI (Calculated): 33.21   INTAKE/OUTPUT:  No intake/output data recorded.  PHYSICAL EXAM:  Physical Exam Blood pressure 137/68, pulse 63, temperature 98.4 F (36.9 C), temperature source Oral, resp. rate 14, height '5\' 9"'$  (1.753 m), weight 102.1 kg, SpO2 93 %. Last Weight  Most recent update: 07/23/2022 12:20 AM    Weight  102.1 kg (225 lb)             CONSTITUTIONAL: Well developed, and nourished, appropriately responsive and aware without distress.  Limits all of his answers to one-word responses. EYES: Sclera non-icteric.   EARS, NOSE, MOUTH AND THROAT: Oral mucosa is pink and moist.    Hearing is intact to voice.  NECK: Trachea is midline, and there is no jugular venous distension.  LYMPH NODES:  Lymph nodes in the neck are not enlarged. RESPIRATORY:  Lungs are clear, and breath sounds are equal bilaterally. Normal respiratory effort without pathologic use of accessory muscles. CARDIOVASCULAR: Heart is regular in rate and rhythm. GI: The abdomen is well rounded, but soft, nontender, and +/-distended. There were no palpable masses. I did not appreciate hepatosplenomegaly. There were normal bowel sounds. MUSCULOSKELETAL:  Symmetrical muscle tone appreciated in all four extremities.    SKIN: Skin turgor is normal. No pathologic skin lesions appreciated.  NEUROLOGIC:  Motor and sensation appear grossly normal.  Cranial nerves are grossly without defect. PSYCH:  Alert and oriented to person, place and time. Affect is appropriate  for situation.  Data Reviewed I have personally reviewed what is currently available of the patient's imaging, recent labs and medical records.    Labs:     Latest Ref Rng & Units 07/23/2022   12:22 AM 04/19/2020    6:22 AM 04/18/2020    6:39 AM  CBC  WBC 4.0 - 10.5 K/uL 15.0  8.6  9.7   Hemoglobin 13.0 - 17.0 g/dL 14.0  11.2  12.8   Hematocrit 39.0 - 52.0 % 44.3  34.9  40.7   Platelets 150 - 400 K/uL 302  316  359       Latest Ref Rng &  Units 07/23/2022    3:46 AM 04/19/2020    6:22 AM 04/18/2020    6:39 AM  CMP  Glucose 70 - 99 mg/dL 158  141  147   BUN 8 - 23 mg/dL 48  19  29   Creatinine 0.61 - 1.24 mg/dL 2.12  1.25  1.52   Sodium 135 - 145 mmol/L 141  146  144   Potassium 3.5 - 5.1 mmol/L 3.5  3.6  3.3   Chloride 98 - 111 mmol/L 104  115  109   CO2 22 - 32 mmol/L '24  22  25   '$ Calcium 8.9 - 10.3 mg/dL 9.6  8.7  9.5   Total Protein 6.5 - 8.1 g/dL 7.7     Total Bilirubin 0.3 - 1.2 mg/dL 1.0     Alkaline Phos 38 - 126 U/L 93     AST 15 - 41 U/L 17     ALT 0 - 44 U/L 21        Imaging studies:   Last 24 hrs: CT ABDOMEN PELVIS WO CONTRAST  Result Date: 07/23/2022 CLINICAL DATA:  71 year old male with history of abdominal pain. Emesis. EXAM: CT ABDOMEN AND PELVIS WITHOUT CONTRAST TECHNIQUE: Multidetector CT imaging of the abdomen and pelvis was performed following the standard protocol without IV contrast. RADIATION DOSE REDUCTION: This exam was performed according to the departmental dose-optimization program which includes automated exposure control, adjustment of the mA and/or kV according to patient size and/or use of iterative reconstruction technique. COMPARISON:  No priors. FINDINGS: Lower chest: Scattered areas of mild cylindrical bronchiectasis with what appear to be regions of mucoid impaction in the lung bases bilaterally, most evident in the left lower lobe. Atherosclerotic calcifications in the descending thoracic aorta as well as the left main, left anterior  descending, left circumflex and right coronary arteries. Mild calcifications of the aortic valve. Hepatobiliary: No definite suspicious cystic or solid hepatic lesions are confidently identified on today's noncontrast CT examination. Unenhanced appearance of the gallbladder is unremarkable. Pancreas: No definite pancreatic mass or peripancreatic fluid collections or inflammatory changes are noted on today's noncontrast CT examination. Spleen: Unremarkable. 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 In the middle third of the left ureter (axial image 61 of series 2 and coronal image 70 of series 5) there is a 0.8 x 0.9 x 1.4 cm calculus which is associated with moderate proximal left hydroureteronephrosis. Additional ill-defined calcifications are noted in the left renal collecting system, potentially additional nonobstructive calculi, although the possibility of a partially calcified mass is not excluded, particularly in the lower pole where the calcifications are very ill-defined. Right kidney and bilateral adrenal glands are normal in appearance. No right hydroureteronephrosis. Urinary bladder is unremarkable in appearance. Stomach/Bowel: The appearance of the stomach is normal. There are several mildly dilated loops of mid small bowel measuring up to 4.4 cm in diameter, with multiple air-fluid levels. Distal small bowel is completely decompressed, with transition from dilated to nondilated loops of small bowel in the left side of the abdomen is, presumably from adhesions. There is some gas and stool noted throughout the colon, although the colon is relatively decompressed as well. Vascular/Lymphatic: Atherosclerotic calcifications throughout the abdominal aorta and pelvic vasculature. No lymphadenopathy noted in the abdomen or pelvis. Reproductive: Prostate gland and seminal vesicles are unremarkable in appearance. Other: No significant volume of  ascites.  No pneumoperitoneum. Musculoskeletal: There are no aggressive appearing lytic or blastic  lesions noted in the visualized portions of the skeleton. IMPRESSION: 1. Findings are concerning for early or partial small bowel obstruction. Surgical consultation is recommended. 2. There is also a large calculus measuring 8 x 9 x 14 mm in the middle third of the left ureter with moderate proximal left hydroureteronephrosis indicating obstruction. Multiple other poorly defined calcifications are present within the left kidney, potentially nonobstructive calculi. However, the possibility of a partially calcified left renal mass is not excluded. Further evaluation with nonemergent abdominal MRI with and without IV gadolinium should be considered in the near future after resolution of the patient's acute illness to better evaluate these findings and exclude malignancy. Urologic consultation is also suggested. 3. Aortic atherosclerosis, in addition to left main and three-vessel coronary artery disease. Please note that although the presence of coronary artery calcium documents the presence of coronary artery disease, the severity of this disease and any potential stenosis cannot be assessed on this non-gated CT examination. Assessment for potential risk factor modification, dietary therapy or pharmacologic therapy may be warranted, if clinically indicated. 4. Additional incidental findings, as above. Electronically Signed   By: Vinnie Langton M.D.   On: 07/23/2022 13:08   DG Chest Portable 1 View  Result Date: 07/23/2022 CLINICAL DATA:  71 year old male with history of shortness of breath. EXAM: PORTABLE CHEST 1 VIEW COMPARISON:  Chest x-ray 04/12/2020. FINDINGS: Lung volumes are low. Diffuse peribronchial cuffing. No consolidative airspace disease. No pleural effusions. No pneumothorax. No pulmonary nodule or mass noted. Pulmonary vasculature and the cardiomediastinal silhouette are within normal limits.  Atherosclerotic calcifications are noted in the thoracic aorta. IMPRESSION: 1. Diffuse peribronchial cuffing. Findings may suggest an acute bronchitis. 2. Aortic atherosclerosis. Electronically Signed   By: Vinnie Langton M.D.   On: 07/23/2022 09:01     Assessment/Plan:  71 y.o. 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  -Anticipate IV fluids for maintenance of hydration, and assistance with AKI.  -I believe is reasonable to allow clear liquids at this point, as it appears most of the original presenting nausea and vomiting seems to have resolved.  - DVT prophylaxis/PPI prophylaxis  -Serial exams, we will follow with you.    Thank you for the opportunity to participate in this patient's care.   -- Ronny Bacon, M.D., FACS 07/23/2022, 2:37 PM

## 2022-07-23 NOTE — Plan of Care (Signed)
  Problem: Fluid Volume: Goal: Ability to maintain a balanced intake and output will improve Outcome: Progressing   Problem: Metabolic: Goal: Ability to maintain appropriate glucose levels will improve Outcome: Progressing

## 2022-07-23 NOTE — Assessment & Plan Note (Signed)
-   Restart home aspirin, Plavix, statin

## 2022-07-23 NOTE — ED Notes (Signed)
X-ray at bedside

## 2022-07-23 NOTE — ED Notes (Signed)
Pt given applesauce and water for PO challenge

## 2022-07-23 NOTE — ED Triage Notes (Signed)
Pt called from [facility] to treatment room no response

## 2022-07-23 NOTE — Assessment & Plan Note (Addendum)
Likely multifactorial in the setting of poor p.o. intake after emesis yesterday.  There is also some component of the renal stone causing chronic hydronephrosis.  Much improved with IV fluids.  Creatinine now at baseline.  Continue to follow labs.

## 2022-07-24 DIAGNOSIS — E669 Obesity, unspecified: Secondary | ICD-10-CM

## 2022-07-24 DIAGNOSIS — K56600 Partial intestinal obstruction, unspecified as to cause: Secondary | ICD-10-CM | POA: Diagnosis not present

## 2022-07-24 DIAGNOSIS — N179 Acute kidney failure, unspecified: Secondary | ICD-10-CM | POA: Diagnosis not present

## 2022-07-24 DIAGNOSIS — N1330 Unspecified hydronephrosis: Secondary | ICD-10-CM | POA: Diagnosis not present

## 2022-07-24 DIAGNOSIS — I10 Essential (primary) hypertension: Secondary | ICD-10-CM | POA: Diagnosis not present

## 2022-07-24 DIAGNOSIS — N201 Calculus of ureter: Secondary | ICD-10-CM

## 2022-07-24 DIAGNOSIS — K56609 Unspecified intestinal obstruction, unspecified as to partial versus complete obstruction: Secondary | ICD-10-CM | POA: Diagnosis not present

## 2022-07-24 LAB — CBC
HCT: 36.7 % — ABNORMAL LOW (ref 39.0–52.0)
Hemoglobin: 11.7 g/dL — ABNORMAL LOW (ref 13.0–17.0)
MCH: 26.9 pg (ref 26.0–34.0)
MCHC: 31.9 g/dL (ref 30.0–36.0)
MCV: 84.4 fL (ref 80.0–100.0)
Platelets: 224 10*3/uL (ref 150–400)
RBC: 4.35 MIL/uL (ref 4.22–5.81)
RDW: 15.6 % — ABNORMAL HIGH (ref 11.5–15.5)
WBC: 6.6 10*3/uL (ref 4.0–10.5)
nRBC: 0 % (ref 0.0–0.2)

## 2022-07-24 LAB — BASIC METABOLIC PANEL
Anion gap: 10 (ref 5–15)
BUN: 53 mg/dL — ABNORMAL HIGH (ref 8–23)
CO2: 23 mmol/L (ref 22–32)
Calcium: 9.1 mg/dL (ref 8.9–10.3)
Chloride: 108 mmol/L (ref 98–111)
Creatinine, Ser: 2.16 mg/dL — ABNORMAL HIGH (ref 0.61–1.24)
GFR, Estimated: 32 mL/min — ABNORMAL LOW (ref >60)
Glucose, Bld: 147 mg/dL — ABNORMAL HIGH (ref 70–99)
Potassium: 3.4 mmol/L — ABNORMAL LOW (ref 3.5–5.1)
Sodium: 141 mmol/L (ref 135–145)

## 2022-07-24 LAB — GLUCOSE, CAPILLARY
Glucose-Capillary: 134 mg/dL — ABNORMAL HIGH (ref 70–99)
Glucose-Capillary: 129 mg/dL — ABNORMAL HIGH (ref 70–99)
Glucose-Capillary: 128 mg/dL — ABNORMAL HIGH (ref 70–99)
Glucose-Capillary: 143 mg/dL — ABNORMAL HIGH (ref 70–99)

## 2022-07-24 LAB — HIV ANTIBODY (ROUTINE TESTING W REFLEX): HIV Screen 4th Generation wRfx: NONREACTIVE

## 2022-07-24 LAB — PROCALCITONIN: Procalcitonin: <0.1 ng/mL

## 2022-07-24 LAB — MAGNESIUM: Magnesium: 2.2 mg/dL (ref 1.7–2.4)

## 2022-07-24 MED ORDER — PANTOPRAZOLE SODIUM 40 MG PO TBEC
40.0000 mg | DELAYED_RELEASE_TABLET | Freq: Two times a day (BID) | ORAL | Status: DC
  Administered 2022-07-24 – 2022-08-07 (×20): 40 mg via ORAL
  Filled 2022-07-24 (×23): qty 1

## 2022-07-24 MED ORDER — ALUM & MAG HYDROXIDE-SIMETH 200-200-20 MG/5ML PO SUSP
30.0000 mL | Freq: Four times a day (QID) | ORAL | Status: DC | PRN
  Administered 2022-07-24 – 2022-07-31 (×6): 30 mL via ORAL
  Filled 2022-07-24 (×6): qty 30

## 2022-07-24 MED ORDER — SODIUM CHLORIDE 0.9 % IV SOLN
1.0000 g | Freq: Once | INTRAVENOUS | Status: AC
  Administered 2022-07-25: 1 g via INTRAVENOUS
  Filled 2022-07-24: qty 1

## 2022-07-24 MED ORDER — SODIUM CHLORIDE 0.9 % IV SOLN
12.5000 mg | Freq: Once | INTRAVENOUS | Status: AC
  Administered 2022-07-24: 12.5 mg via INTRAVENOUS
  Filled 2022-07-24: qty 0.5

## 2022-07-24 NOTE — Consult Note (Signed)
Urology Consult  Requesting physician: Marjean Donna, MD  Reason for consultation: Ureteral calculus with hydronephrosis  Chief Complaint: N/A  History of Present Illness: Rodney Estrada is a 71 y.o. who presented to the [facility] ED with complaints of nausea, emesis and worsening acid reflux.  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  Resides at an assisted living facility.  Admission CT with findings felt to represent early versus partial small bowel obstruction.  He was also noted to have a 14 mm left lower proximal ureteral calculus with moderate-severe hydronephrosis/hydroureter.  During his hospitalization for CVA July 2021 a portable KUB was obtained to check feeding tube placement.  He had received IV contrast for CT angio and on the portable KUB noted to have severe left hydronephrosis with hydroureter to a similar area of the lower proximal ureter.  Denies left flank or abdominal pain.  No gross hematuria.  Prior history of stones which he states he has passed on occasions and not required intervention.  Past Medical History:  Diagnosis Date   Basal cell carcinoma    Chronic gingivitis    Constipation    Coronary artery aneurysm    Functional dyspepsia    Gout    Hemiparesis (HCC)    Hemiplegia (HCC)    Hemorrhoids    Hypertension    Major depressive disorder    Muscle weakness    Seborrhea capitis     Past Surgical History:  Procedure Laterality Date   IR CT HEAD LTD  04/05/2020   IR INTRA CRAN STENT  04/05/2020   IR PERCUTANEOUS ART THROMBECTOMY/INFUSION INTRACRANIAL INC DIAG ANGIO  04/05/2020       IR PERCUTANEOUS ART THROMBECTOMY/INFUSION INTRACRANIAL INC DIAG ANGIO  04/05/2020   RADIOLOGY WITH ANESTHESIA N/A 04/05/2020   Procedure: IR WITH ANESTHESIA;  Surgeon: Radiologist, Medication, MD;  Location: [facility];  Service: Radiology;  Laterality: N/A;    Home Medications:  No outpatient medications have been marked as taking for the 07/23/22 encounter [facility]  Encounter).    Allergies: No Known Allergies  Family History  Problem Relation Age of Onset   Hypertension Mother    Hypertension Father     Social History:  reports that he has been smoking. He has never used smokeless tobacco. He reports current alcohol use of about 10.0 standard drinks of alcohol per week. He reports that he does not currently use drugs.  ROS: Noncontributory except as per the HPI  Physical Exam:  Vital signs in last 24 hours: Temp:  [97.4 F (36.3 C)-99.3 F (37.4 C)] 99.3 F (37.4 C) (10/30 0331) Pulse Rate:  [58-78] 78 (10/30 0331) Resp:  [12-20] 16 (10/30 0331) BP: (95-143)/(49-79) 143/79 (10/30 0331) SpO2:  [93 %-98 %] 94 % (10/30 0331) Constitutional:  Alert, No acute distress HEENT: [place] ATs Cardiovascular: Regular rate and rhythm, no clubbing, cyanosis, or edema. Respiratory: Normal respiratory effort, lungs clear bilaterally GI: Abdomen is soft, nontender, nondistended, no abdominal masses GU: No CVA tenderness Skin: No rashes, bruises or suspicious lesions Lymph: No cervical or inguinal adenopathy Neurologic: Left hemiparesis   Laboratory Data:  Recent Labs    07/23/22 0022 07/24/22 0457  WBC 15.0* 6.6  HGB 14.0 11.7*  HCT 44.3 36.7*   Recent Labs    07/23/22 0346 07/24/22 0457  NA 141 141  K 3.5 3.4*  CL 104 108  CO2 24 23  GLUCOSE 158* 147*  BUN 48* 53*  CREATININE 2.12* 2.16*  CALCIUM 9.6 9.1  Recent Labs    07/23/22 0022  INR 1.1   No results for input(s): "LABURIN" in the last 72 hours. Results for orders placed or performed during the hospital encounter of 07/23/22  SARS Coronavirus 2 by RT PCR (hospital order, performed in [facility] hospital lab) *cepheid single result test* Anterior Nasal Swab     Status: None   Collection Time: 07/23/22  9:28 AM   Specimen: Anterior Nasal Swab  Result Value Ref Range Status   SARS Coronavirus 2 by RT PCR NEGATIVE NEGATIVE Final    Comment: (NOTE) SARS-CoV-2 target nucleic  acids are NOT DETECTED.  The SARS-CoV-2 RNA is generally detectable in upper and lower respiratory specimens during the acute phase of infection. The lowest concentration of SARS-CoV-2 viral copies this assay can detect is 250 copies / mL. A negative result does not preclude SARS-CoV-2 infection and should not be used as the sole basis for treatment or other patient management decisions.  A negative result may occur with improper specimen collection / handling, submission of specimen other than nasopharyngeal swab, presence of viral mutation(s) within the areas targeted by this assay, and inadequate number of viral copies (<250 copies / mL). A negative result must be combined with clinical observations, patient history, and epidemiological information.  Fact Sheet for Patients:   https://www.patel.info/  Fact Sheet for Healthcare Providers: https://hall.com/  This test is not yet approved or  cleared by the [place] FDA and has been authorized for detection and/or diagnosis of SARS-CoV-2 by FDA under an Emergency Use Authorization (EUA).  This EUA will remain in effect (meaning this test can be used) for the duration of the COVID-19 declaration under Section 564(b)(1) of the Act, 21 U.S.C. section 360bbb-3(b)(1), unless the authorization is terminated or revoked sooner.  Performed at [facility], [address] [place], [place] [address]      Radiologic Imaging: CT images personally reviewed and interpreted.  Partial duplication left collecting system with moderate-severe hydronephrosis/hydroureter to a lower proximal ureteral calculus.  Hyperdense material versus calcification left lower pole  CT ABDOMEN PELVIS WO CONTRAST  Result Date: 07/23/2022 CLINICAL DATA:  71 year old male with history of abdominal pain. Emesis. EXAM: CT ABDOMEN AND PELVIS WITHOUT CONTRAST TECHNIQUE: Multidetector CT imaging of the abdomen and  pelvis was performed following the standard protocol without IV contrast. RADIATION DOSE REDUCTION: This exam was performed according to the departmental dose-optimization program which includes automated exposure control, adjustment of the mA and/or kV according to patient size and/or use of iterative reconstruction technique. COMPARISON:  No priors. FINDINGS: Lower chest: Scattered areas of mild cylindrical bronchiectasis with what appear to be regions of mucoid impaction in the lung bases bilaterally, most evident in the left lower lobe. Atherosclerotic calcifications in the descending thoracic aorta as well as the left main, left anterior descending, left circumflex and right coronary arteries. Mild calcifications of the aortic valve. Hepatobiliary: No definite suspicious cystic or solid hepatic lesions are confidently identified on today's noncontrast CT examination. Unenhanced appearance of the gallbladder is unremarkable. Pancreas: No definite pancreatic mass or peripancreatic fluid collections or inflammatory changes are noted on today's noncontrast CT examination. Spleen: Unremarkable. 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 In the middle third of the left ureter (axial image 61 of series 2 and coronal image 70 of series 5) there is a 0.8 x 0.9 x 1.4 cm calculus which is associated with moderate proximal left hydroureteronephrosis. Additional ill-defined calcifications  are noted in the left renal collecting system, potentially additional nonobstructive calculi, although the possibility of a partially calcified mass is not excluded, particularly in the lower pole where the calcifications are very ill-defined. Right kidney and bilateral adrenal glands are normal in appearance. No right hydroureteronephrosis. Urinary bladder is unremarkable in appearance. Stomach/Bowel: The appearance of the stomach is normal. There are several mildly  dilated loops of mid small bowel measuring up to 4.4 cm in diameter, with multiple air-fluid levels. Distal small bowel is completely decompressed, with transition from dilated to nondilated loops of small bowel in the left side of the abdomen is, presumably from adhesions. There is some gas and stool noted throughout the colon, although the colon is relatively decompressed as well. Vascular/Lymphatic: Atherosclerotic calcifications throughout the abdominal aorta and pelvic vasculature. No lymphadenopathy noted in the abdomen or pelvis. Reproductive: Prostate gland and seminal vesicles are unremarkable in appearance. Other: No significant volume of ascites.  No pneumoperitoneum. Musculoskeletal: There are no aggressive appearing lytic or blastic lesions noted in the visualized portions of the skeleton. IMPRESSION: 1. Findings are concerning for early or partial small bowel obstruction. Surgical consultation is recommended. 2. There is also a large calculus measuring 8 x 9 x 14 mm in the middle third of the left ureter with moderate proximal left hydroureteronephrosis indicating obstruction. Multiple other poorly defined calcifications are present within the left kidney, potentially nonobstructive calculi. However, the possibility of a partially calcified left renal mass is not excluded. Further evaluation with nonemergent abdominal MRI with and without IV gadolinium should be considered in the near future after resolution of the patient's acute illness to better evaluate these findings and exclude malignancy. Urologic consultation is also suggested. 3. Aortic atherosclerosis, in addition to left main and three-vessel coronary artery disease. Please note that although the presence of coronary artery calcium documents the presence of coronary artery disease, the severity of this disease and any potential stenosis cannot be assessed on this non-gated CT examination. Assessment for potential risk factor modification,  dietary therapy or pharmacologic therapy may be warranted, if clinically indicated. 4. Additional incidental findings, as above. Electronically Signed   By: Vinnie Langton M.D.   On: 07/23/2022 13:08   DG Chest Portable 1 View  Result Date: 07/23/2022 CLINICAL DATA:  71 year old male with history of shortness of breath. EXAM: PORTABLE CHEST 1 VIEW COMPARISON:  Chest x-ray 04/12/2020. FINDINGS: Lung volumes are low. Diffuse peribronchial cuffing. No consolidative airspace disease. No pleural effusions. No pneumothorax. No pulmonary nodule or mass noted. Pulmonary vasculature and the cardiomediastinal silhouette are within normal limits. Atherosclerotic calcifications are noted in the thoracic aorta. IMPRESSION: 1. Diffuse peribronchial cuffing. Findings may suggest an acute bronchitis. 2. Aortic atherosclerosis. Electronically Signed   By: Vinnie Langton M.D.   On: 07/23/2022 09:01    Impression/Assessment:  71 y.o. male with moderate-severe left hydronephrosis secondary to a 14 mm lower proximal ureteral calculus.  This appears to be chronic as also noted on a postcontrast KUB in 2021.  Significant left renal atrophy secondary to chronic obstruction Hyperdense material inferior collecting system  Recommendation:  Admission urinalysis with pyuria.  Urine culture is pending. Recommend cystoscopy with left ureteral stent placement and follow-up ureteroscopy in hopes of preserving any remaining renal function and the kidney Does not have to be done acutely however could add on to schedule tomorrow Renal mass protocol MRI abdomen    07/24/2022, 7:48 AM  Irish Giovanni,  MD

## 2022-07-24 NOTE — Progress Notes (Addendum)
[facility] Progress Note  Patient: Rodney Estrada    PYK:[id number]  DOA: 07/23/2022    Date of Service: the patient was seen and examined on 07/24/2022  Brief hospital course: 71 year old male with past medical history of CVA with left-sided hemiparesis, hypertension and depression who presented to the emergency room on 10/29 with 1 episode of emesis.  Patient found to have partial versus early small bowel obstruction that look to be mild.  Patient also noted to have large calculus with moderate proximal left hydroureteronephrosis.  Patient seen by urology who felt findings were chronic.  Patient seen by general surgery who felt a small bowel obstruction could be managed conservatively.  By 10/30, obstruction felt to have resolved and patient started on clear liquids.  Assessment and Plan: Assessment and Plan: * SBO (small bowel obstruction) ([place]) Improving.  Diet being advanced.    AKI (acute kidney injury) ([place]) in the setting of stage IIIa chronic kidney disease Likely multifactorial in the setting of poor p.o. intake after emesis yesterday.  Little change from previous day although we will continue to hydrate.   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 - Restart home aspirin, Plavix, statin  Essential hypertension Patient's blood pressure was initially low on admission and is currently receiving IV fluids.  Will hold home antihypertensives and expect to restart tomorrow.  - Holding home labetalol due to hypotension - Holding home lisinopril due to hypotension and AKI  Type 2 diabetes mellitus ([place]) - Holding home oral antiglycemic therapy, CBG stable with sliding scale  Nephrolithiasis Patient has a history of nephrolithiasis with CT imaging notable for large calculus with moderate proximal left hydroureteronephrosis.  Urology feels that this is chronic and feels that left ureteral stent placement and follow-up ureteroscopy to be  done, but can be done as outpatient..  Obesity Meets criteria BMI greater than 30       Body mass index is 33.23 kg/m.        Consultants: Urology General surgery  Procedures: None  Antimicrobials: IV Rocephin 10/29-present  Code Status: Full code   Subjective: Patient with weakness  Objective:  Vitals:   07/24/22 0828 07/24/22 1626  BP: 134/78 (!) 139/93  Pulse: 76 91  Resp: 16 16  Temp: 98.3 F (36.8 C) 99 F (37.2 C)  SpO2: 94% (!) 89%    Intake/Output Summary (Last 24 hours) at 07/24/2022 1633 Last data filed at 07/24/2022 1403 Gross per 24 hour  Intake 1260 ml  Output --  Net 1260 ml   Filed Weights   07/23/22 0020  Weight: 102.1 kg   Body mass index is 33.23 kg/m.  Exam:  General: Alert and oriented x2, no acute distress HEENT: Normocephalic, atraumatic, mucous membranes slightly dry Cardiovascular: Regular rate and rhythm, S1-S2 Respiratory: Clear to auscultation bilaterally Abdomen: Soft, nontender, nondistended, positive bowel sounds in Musculoskeletal: No clubbing or cyanosis or edema Skin: No skin breaks, tears or lesions Psychiatry: Patient is appropriate, no evidence of psychoses Neurology: Residual right-sided hemiparesis  Data Reviewed: Creatinine of 2.16.  White blood cell count normalized hemoglobin of 1.7.  Disposition:  Status is: In patient     Anticipated discharge date: 10/31  Remaining issues to be resolved so that patient can be discharged: -Decision on uteroscopy -Advancement of diet   Family Communication: We will call family DVT Prophylaxis:   Lovenox    Author: Annita Brod ,MD 07/24/2022 4:33 PM  To reach On-call, see care teams  to locate the attending and reach out via [email or web address]. Between 7PM-7AM, please contact night-coverage If you still have difficulty reaching the attending provider, please page the [facility] (Director on Call) for [facility] on amion for assistance.

## 2022-07-24 NOTE — NC FL2 (Signed)
[place] LEVEL OF CARE SCREENING TOOL     IDENTIFICATION  Patient Name: Rodney Estrada Birthdate: [birth year] Sex: male Admission Date (Current Location): 07/23/2022  [facility] and [place] Number:  Engineering geologist and Address:         Provider Number: [id number]  Attending Physician Name and Address:  Annita Brod, MD  Relative Name and Phone Number:       Current Level of Care: Hospital Recommended Level of Care: Nursing Facility Prior Approval Number:    Date Approved/Denied:   PASRR Number: [id number]  Discharge Plan: SNF    Current Diagnoses: Patient Active Problem List   Diagnosis Date Noted   SBO (small bowel obstruction) ([place]) 07/23/2022   Nephrolithiasis 07/23/2022   Dark emesis 07/23/2022   Partial intestinal obstruction (HCC)    Cerebral edema ([place]) 04/16/2020   Hypertensive emergency 04/16/2020   Essential hypertension 04/16/2020   Hyperlipidemia LDL goal <70 04/16/2020   Type 2 diabetes mellitus ([place]) 04/16/2020   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 04/16/2020   AKI (acute kidney injury) ([place]) 04/16/2020   Tobacco use disorder 04/16/2020   Alcohol abuse 04/16/2020   Cocaine abuse ([place]) 04/16/2020   Obesity 04/16/2020   Stroke (cerebrum) ([place]) - R MCA s/p TPA and mechanical thrombectomy w/ stent placement 04/05/2020    Orientation RESPIRATION BLADDER Height & Weight     Self, Time, Situation, Place  Normal Incontinent Weight: 102.1 kg Height:  '5\' 9"'$  (175.3 cm)  BEHAVIORAL SYMPTOMS/MOOD NEUROLOGICAL BOWEL NUTRITION STATUS      Continent Diet ([place]. Plan to advance prior to discharge)  AMBULATORY STATUS COMMUNICATION OF NEEDS Skin   Total Care Verbally Normal                       Personal Care Assistance Level of Assistance              Functional Limitations Info             SPECIAL CARE FACTORS FREQUENCY                       Contractures Contractures Info: Not present    Additional Factors  Info  Code Status, Allergies Code Status Info: Full Allergies Info: NKDA           Current Medications (07/24/2022):  This is the current hospital active medication list Current Facility-Administered Medications  Medication Dose Route Frequency Provider Last Rate Last Admin   acetaminophen (TYLENOL) tablet 650 mg  650 mg Oral Q6H PRN Jose Persia, MD       Or   acetaminophen (TYLENOL) suppository 650 mg  650 mg Rectal Q6H PRN Jose Persia, MD       alum & mag hydroxide-simeth (MAALOX/MYLANTA) 200-200-20 MG/5ML suspension 30 mL  30 mL Oral Q6H PRN Annita Brod, MD   30 mL at 07/24/22 1505   aspirin chewable tablet 81 mg  81 mg Oral Daily Jose Persia, MD   81 mg at 07/24/22 0913   atorvastatin (LIPITOR) tablet 40 mg  40 mg Oral QPM Jose Persia, MD   40 mg at 07/23/22 1706   [START ON 07/25/2022] cefTRIAXone (ROCEPHIN) 1 g in sodium chloride 0.9 % 100 mL IVPB  1 g Intravenous Once Stoioff, Scott C, MD       clopidogrel (PLAVIX) tablet 75 mg  75 mg Oral Daily Jose Persia, MD   75 mg at 07/24/22 0914   enoxaparin (LOVENOX)  injection 50 mg  0.5 mg/kg Subcutaneous Q24H Alison Murray, RPH   50 mg at 07/24/22 1505   insulin aspart (novoLOG) injection 0-15 Units  0-15 Units Subcutaneous TID WC Jose Persia, MD   2 Units at 07/24/22 1244   ondansetron (ZOFRAN) tablet 4 mg  4 mg Oral Q6H PRN Jose Persia, MD       Or   ondansetron (ZOFRAN) injection 4 mg  4 mg Intravenous Q6H PRN Jose Persia, MD   4 mg at 07/24/22 0020   polyethylene glycol (MIRALAX / GLYCOLAX) packet 17 g  17 g Oral Daily PRN Jose Persia, MD       sodium chloride flush (NS) 0.9 % injection 3 mL  3 mL Intravenous Q12H Jose Persia, MD   3 mL at 07/24/22 9233     Discharge Medications: Please see discharge summary for a list of discharge medications.  Relevant Imaging Results:  Relevant Lab Results:   Additional Information SS#: [id number]  Beverly Sessions, RN

## 2022-07-24 NOTE — Progress Notes (Signed)
       CROSS COVER NOTE  NAME: Rodney Estrada MRN: [id number] DOB : August 28, 1951 ATTENDING PHYSICIAN: Annita Brod, MD    Date of Service   07/24/2022   HPI/Events of Note   Medication request received for nausea and vomiting refractory to ordered Zofran.  Interventions   Assessment/Plan:  Phenergan x1      This document was prepared using Dragon voice recognition software and may include unintentional dictation errors.  Neomia Glass DNP, MBA, FNP-BC Nurse Practitioner [facility] Pager [phone]

## 2022-07-24 NOTE — Discharge Instructions (Signed)
Some PCP options in [place] area- not a comprehensive list  [facility]- [place]- [phone] [facility]- [place]- [place]- [phone]  or [facility] Physician Referral Line [phone]

## 2022-07-24 NOTE — TOC Initial Note (Signed)
Transition of Care [facility]) - Initial/Assessment Note    Patient Details  Name: Rodney Estrada MRN: [id number] Date of Birth: 1951-06-07  [facility]) CM/SW Contact:    Beverly Sessions, RN Phone Number: 07/24/2022, 4:14 PM  Clinical Narrative:                  Admitted for:SBO Admitted from:[facility] PCP: Followed by provider at [place]   Patient confirms his plans are to return to [facility] at discharge - Confirmed with Ricky at [facility] no issues returning at discharge - fl2 sent for signature - will require EMS transport at discharge         Patient Goals and CMS Choice        Expected Discharge Plan and Services                                                Prior Living Arrangements/Services                       Activities of Daily Living      Permission Sought/Granted                  Emotional Assessment              Admission diagnosis:  Kidney stone [N20.0] SBO (small bowel obstruction) ([place]) [K56.609] AKI (acute kidney injury) ([place]) [N17.9] Partial intestinal obstruction, unspecified cause ([place]) [K56.600] Patient Active Problem List   Diagnosis Date Noted   SBO (small bowel obstruction) ([place]) 07/23/2022   Nephrolithiasis 07/23/2022   Dark emesis 07/23/2022   Partial intestinal obstruction (HCC)    Cerebral edema ([place]) 04/16/2020   Hypertensive emergency 04/16/2020   Essential hypertension 04/16/2020   Hyperlipidemia LDL goal <70 04/16/2020   Type 2 diabetes mellitus ([place]) 04/16/2020   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 04/16/2020   AKI (acute kidney injury) ([place]) 04/16/2020   Tobacco use disorder 04/16/2020   Alcohol abuse 04/16/2020   Cocaine abuse ([place]) 04/16/2020   Obesity 04/16/2020   Stroke (cerebrum) ([place]) - R MCA s/p TPA and mechanical thrombectomy w/ stent placement 04/05/2020   PCP:  Pcp, No Pharmacy:   [facility] [place], [address] [place] [address] Phone: [phone] Fax: [phone]     Social Determinants of Health (SDOH) Interventions    Readmission Risk Interventions     No data to display

## 2022-07-24 NOTE — Progress Notes (Signed)
Pt has vomited x1 at this time, tan colored emesis 400 ml. Given zofran 4 mg IV prior at 1729 with minimal effects per report. Neomia Glass, NP notified received new order for phenergan 12.5 mg IVPB x1. Will monitor for effects.

## 2022-07-24 NOTE — Progress Notes (Signed)
[facility] SURGICAL PROGRESS NOTE (cpt [phone])  Hospital Day(s): 0.   Interval History: Patient seen and examined, no acute events or new complaints overnight. Patient not reliable historian but denied any abdominal pain. Was getting cleaned up after BM by RN staff. He is without leukocytosis this morning; 6.6K. Renal function elevated but stable; sCr - 2.16; UO - unmeasured. Mild hypokalemia to 3.4. He is on CLD; tolerating well. He does continue to have bowel function.   Review of Systems:  Unable to reliably perform  Vital signs in last 24 hours: [min-max] current  Temp:  [97.4 F (36.3 C)-99.3 F (37.4 C)] 99.3 F (37.4 C) (10/30 0331) Pulse Rate:  [58-78] 78 (10/30 0331) Resp:  [12-20] 16 (10/30 0331) BP: (95-143)/(49-79) 143/79 (10/30 0331) SpO2:  [93 %-98 %] 94 % (10/30 0331)     Height: '5\' 9"'$  (175.3 cm) Weight: 102.1 kg BMI (Calculated): 33.21   Intake/Output last 2 shifts:  10/29 0701 - 10/30 0700 In: 500 [IV Piggyback:500] Out: -    Physical Exam:  Constitutional: alert, cooperative and no distress  HENT: normocephalic without obvious abnormality  Eyes: PERRL, EOM's grossly intact and symmetric  Respiratory: breathing non-labored at rest  Cardiovascular: regular rate and sinus rhythm  Gastrointestinal: Soft, non-tender, and non-distended, no rebound/guarding Musculoskeletal: no edema or wounds, motor and sensation grossly intact, NT    Labs:     Latest Ref Rng & Units 07/24/2022    4:57 AM 07/23/2022   12:22 AM 04/19/2020    6:22 AM  CBC  WBC 4.0 - 10.5 K/uL 6.6  15.0  8.6   Hemoglobin 13.0 - 17.0 g/dL 11.7  14.0  11.2   Hematocrit 39.0 - 52.0 % 36.7  44.3  34.9   Platelets 150 - 400 K/uL 224  302  316       Latest Ref Rng & Units 07/24/2022    4:57 AM 07/23/2022    3:46 AM 04/19/2020    6:22 AM  CMP  Glucose 70 - 99 mg/dL 147  158  141   BUN 8 - 23 mg/dL 53  48  19   Creatinine 0.61 - 1.24 mg/dL 2.16  2.12  1.25   Sodium 135 - 145  mmol/L 141  141  146   Potassium 3.5 - 5.1 mmol/L 3.4  3.5  3.6   Chloride 98 - 111 mmol/L 108  104  115   CO2 22 - 32 mmol/L '23  24  22   '$ Calcium 8.9 - 10.3 mg/dL 9.1  9.6  8.7   Total Protein 6.5 - 8.1 g/dL  7.7    Total Bilirubin 0.3 - 1.2 mg/dL  1.0    Alkaline Phos 38 - 126 U/L  93    AST 15 - 41 U/L  17    ALT 0 - 44 U/L  21      Imaging studies: No new pertinent imaging studies   Assessment/Plan: (ICD-10's: K78.609) 71 y.o. male with RBOF admitted with possible early SBO vs other GI issue, complicated by deconditioning, history of CVA   - Okay to begin advancement of diet as tolerated - No indication for surgical intervention nor NGT placement - Monitor abdominal examination; on-going bowel function - Pain control prn; antiemetics prn  - Mobilize as tolerated   - Further management per primary service    - General surgery will sign off; Please call with questions/concerns    All of the above findings and recommendations were discussed with  the patient, and the medical team.  -- Rodney Simon, PA-C [facility] 07/24/2022, 7:32 AM M-F: 7am - 4pm

## 2022-07-24 NOTE — Hospital Course (Addendum)
60 yom w/ hx of CVA with left-sided hemiparesis, hypertension and depression who presented to the emergency room on 10/29 with 1 episode of emesis, found to have partial versus early small bowel obstruction that look to be mild.  Patient also noted to have large calculus with moderate proximal left hydroureteronephrosis.  Patient seen by urology who felt findings were chronic.  Patient seen by general surgery who felt a small bowel obstruction could be managed conservatively.  By 10/30, obstruction felt to have resolved and patient started on clear liquids.  Diet advanced and by 10/31, tolerating soft bland food.  However, starting evening of 10/31, patient started having nausea and vomiting.  Repeat x-ray on 11/1 noted recurrence of small bowel obstruction.  With persistent obstruction, NG tube placed by interventional radiology 11/3.  Urology met with patient on 10/31 about placing stent.  Recommendation is to have it done sooner than later to preserve remaining renal function as long as it is more than 20%.  After some consideration, patient amenable, and urology planning to take patient to OR once SBO resolves.  Patient appears to have persistent small bowel obstruction on x-ray, but was able to have bowel movements.  General surgery is following.  Central line placed and TPN .He had intermittent fever, started on Rocephin on 11/6, urology is still planning for surgery later this week. 11/8: NG tube removed, surgery signed off.  Underwent cystoscopy with left ureteroscopy with laser lithotripsy ureteral stent placement.  Urine culture sent

## 2022-07-24 NOTE — Assessment & Plan Note (Signed)
Meets criteria BMI greater than 30 

## 2022-07-24 NOTE — Care Management Obs Status (Signed)
[place] NOTIFICATION   Patient Details  Name: Rodney Estrada MRN: [id number] Date of Birth: 1951/05/05   Medicare Observation Status Notification Given:  Yes    Beverly Sessions, RN 07/24/2022, 4:17 PM

## 2022-07-25 ENCOUNTER — Encounter
Admission: EM | Disposition: A | Discharge status: Skilled Nursing Facility | Payer: Self-pay | Source: Skilled Nursing Facility | Attending: Internal Medicine

## 2022-07-25 DIAGNOSIS — R8281 Pyuria: Secondary | ICD-10-CM | POA: Diagnosis not present

## 2022-07-25 DIAGNOSIS — E876 Hypokalemia: Secondary | ICD-10-CM | POA: Diagnosis present

## 2022-07-25 DIAGNOSIS — K56600 Partial intestinal obstruction, unspecified as to cause: Secondary | ICD-10-CM | POA: Diagnosis present

## 2022-07-25 DIAGNOSIS — I69354 Hemiplegia and hemiparesis following cerebral infarction affecting left non-dominant side: Secondary | ICD-10-CM | POA: Diagnosis not present

## 2022-07-25 DIAGNOSIS — Z1152 Encounter for screening for COVID-19: Secondary | ICD-10-CM | POA: Diagnosis not present

## 2022-07-25 DIAGNOSIS — I10 Essential (primary) hypertension: Secondary | ICD-10-CM | POA: Diagnosis not present

## 2022-07-25 DIAGNOSIS — N1831 Chronic kidney disease, stage 3a: Secondary | ICD-10-CM | POA: Diagnosis present

## 2022-07-25 DIAGNOSIS — K219 Gastro-esophageal reflux disease without esophagitis: Secondary | ICD-10-CM | POA: Diagnosis present

## 2022-07-25 DIAGNOSIS — K920 Hematemesis: Secondary | ICD-10-CM | POA: Diagnosis present

## 2022-07-25 DIAGNOSIS — N201 Calculus of ureter: Secondary | ICD-10-CM | POA: Diagnosis not present

## 2022-07-25 DIAGNOSIS — N261 Atrophy of kidney (terminal): Secondary | ICD-10-CM | POA: Diagnosis not present

## 2022-07-25 DIAGNOSIS — N179 Acute kidney failure, unspecified: Secondary | ICD-10-CM | POA: Diagnosis present

## 2022-07-25 DIAGNOSIS — Z1612 Extended spectrum beta lactamase (ESBL) resistance: Secondary | ICD-10-CM | POA: Diagnosis present

## 2022-07-25 DIAGNOSIS — Z85828 Personal history of other malignant neoplasm of skin: Secondary | ICD-10-CM | POA: Diagnosis not present

## 2022-07-25 DIAGNOSIS — E870 Hyperosmolality and hypernatremia: Secondary | ICD-10-CM | POA: Diagnosis not present

## 2022-07-25 DIAGNOSIS — E872 Acidosis, unspecified: Secondary | ICD-10-CM | POA: Diagnosis not present

## 2022-07-25 DIAGNOSIS — N132 Hydronephrosis with renal and ureteral calculous obstruction: Secondary | ICD-10-CM | POA: Diagnosis present

## 2022-07-25 DIAGNOSIS — E1122 Type 2 diabetes mellitus with diabetic chronic kidney disease: Secondary | ICD-10-CM | POA: Diagnosis present

## 2022-07-25 DIAGNOSIS — E871 Hypo-osmolality and hyponatremia: Secondary | ICD-10-CM | POA: Diagnosis not present

## 2022-07-25 DIAGNOSIS — K56609 Unspecified intestinal obstruction, unspecified as to partial versus complete obstruction: Secondary | ICD-10-CM | POA: Diagnosis not present

## 2022-07-25 DIAGNOSIS — N200 Calculus of kidney: Secondary | ICD-10-CM | POA: Diagnosis present

## 2022-07-25 DIAGNOSIS — I959 Hypotension, unspecified: Secondary | ICD-10-CM | POA: Diagnosis present

## 2022-07-25 DIAGNOSIS — R066 Hiccough: Secondary | ICD-10-CM | POA: Diagnosis present

## 2022-07-25 DIAGNOSIS — E669 Obesity, unspecified: Secondary | ICD-10-CM | POA: Diagnosis present

## 2022-07-25 DIAGNOSIS — Z6833 Body mass index (BMI) 33.0-33.9, adult: Secondary | ICD-10-CM | POA: Diagnosis not present

## 2022-07-25 DIAGNOSIS — B9620 Unspecified Escherichia coli [E. coli] as the cause of diseases classified elsewhere: Secondary | ICD-10-CM | POA: Diagnosis present

## 2022-07-25 DIAGNOSIS — Z794 Long term (current) use of insulin: Secondary | ICD-10-CM | POA: Diagnosis not present

## 2022-07-25 DIAGNOSIS — I129 Hypertensive chronic kidney disease with stage 1 through stage 4 chronic kidney disease, or unspecified chronic kidney disease: Secondary | ICD-10-CM | POA: Diagnosis present

## 2022-07-25 DIAGNOSIS — F329 Major depressive disorder, single episode, unspecified: Secondary | ICD-10-CM | POA: Diagnosis present

## 2022-07-25 DIAGNOSIS — I2510 Atherosclerotic heart disease of native coronary artery without angina pectoris: Secondary | ICD-10-CM | POA: Diagnosis present

## 2022-07-25 LAB — SURGICAL PCR SCREEN
MRSA, PCR: NEGATIVE
Staphylococcus aureus: NEGATIVE

## 2022-07-25 LAB — GLUCOSE, CAPILLARY
Glucose-Capillary: 119 mg/dL — ABNORMAL HIGH (ref 70–99)
Glucose-Capillary: 135 mg/dL — ABNORMAL HIGH (ref 70–99)
Glucose-Capillary: 116 mg/dL — ABNORMAL HIGH (ref 70–99)
Glucose-Capillary: 134 mg/dL — ABNORMAL HIGH (ref 70–99)
Glucose-Capillary: 141 mg/dL — ABNORMAL HIGH (ref 70–99)

## 2022-07-25 LAB — CBC
HCT: 36.7 % — ABNORMAL LOW (ref 39.0–52.0)
Hemoglobin: 11.8 g/dL — ABNORMAL LOW (ref 13.0–17.0)
MCH: 26.9 pg (ref 26.0–34.0)
MCHC: 32.2 g/dL (ref 30.0–36.0)
MCV: 83.6 fL (ref 80.0–100.0)
Platelets: 238 10*3/uL (ref 150–400)
RBC: 4.39 MIL/uL (ref 4.22–5.81)
RDW: 15.2 % (ref 11.5–15.5)
WBC: 6.7 10*3/uL (ref 4.0–10.5)
nRBC: 0 % (ref 0.0–0.2)

## 2022-07-25 LAB — BASIC METABOLIC PANEL
Anion gap: 9 (ref 5–15)
BUN: 42 mg/dL — ABNORMAL HIGH (ref 8–23)
CO2: 23 mmol/L (ref 22–32)
Calcium: 9 mg/dL (ref 8.9–10.3)
Chloride: 108 mmol/L (ref 98–111)
Creatinine, Ser: 1.88 mg/dL — ABNORMAL HIGH (ref 0.61–1.24)
GFR, Estimated: 38 mL/min — ABNORMAL LOW (ref >60)
Glucose, Bld: 130 mg/dL — ABNORMAL HIGH (ref 70–99)
Potassium: 3.3 mmol/L — ABNORMAL LOW (ref 3.5–5.1)
Sodium: 140 mmol/L (ref 135–145)

## 2022-07-25 LAB — URINE CULTURE: Culture: <10000 — AB

## 2022-07-25 SURGERY — CYSTOSCOPY, WITH STENT INSERTION
Anesthesia: Choice | Laterality: Left

## 2022-07-25 MED ORDER — MUPIROCIN 2 % EX OINT
1.0000 | TOPICAL_OINTMENT | Freq: Two times a day (BID) | CUTANEOUS | Status: AC
  Administered 2022-07-25 – 2022-07-29 (×10): 1 via NASAL
  Filled 2022-07-25: qty 22

## 2022-07-25 MED ORDER — SODIUM CHLORIDE 0.9 % IV SOLN
1.0000 g | INTRAVENOUS | Status: DC
  Administered 2022-07-26: 1 g via INTRAVENOUS
  Filled 2022-07-25: qty 10

## 2022-07-25 NOTE — Progress Notes (Signed)
Spoke with pt x2 regarding pending procedure today. Pt initially stated that he did not speak with a doctor in regards to the procedure and then subsequently states that "he said something about it, but I don't know". Pt declined to sign consent. Pt kept NPO, CHG bath performed, and MRSA PCR swab sent for possibility of procedure today. No further vomiting this shift.

## 2022-07-25 NOTE — Progress Notes (Signed)
Pt noted with 5-6 lliquid stools within 24 hrs. In addition to persistent nausea with emesis this morning. Info relayed via secure chat to attending Dr. Maryland Pink and primary day shift nurse, Ale,RN.

## 2022-07-25 NOTE — Progress Notes (Signed)
[facility] Progress Note  Patient: Rodney Estrada    MRN:[id number]  DOA: 07/23/2022    Date of Service: the patient was seen and examined on 07/25/2022  Brief hospital course: 71-year-old male with past medical history of CVA with left-sided hemiparesis, hypertension and depression who presented to the emergency room on 10/29 with 1 episode of emesis.  Patient found to have partial versus early small bowel obstruction that look to be mild.  Patient also noted to have large calculus with moderate proximal left hydroureteronephrosis.  Patient seen by urology who felt findings were chronic.  Patient seen by general surgery who felt a small bowel obstruction could be managed conservatively.  By 10/30, obstruction felt to have resolved and patient started on clear liquids.  Diet advanced and by 10/31, tolerating soft bland food.  Urology met with patient on 10/31 about placing stent.  Recommendation is to have it done sooner than later to preserve remaining renal function as long as it is more than 20%.  After some consideration, patient amenable.  Urology seeing if OR schedule will allow for 11/1.  Assessment and Plan: Assessment and Plan: * SBO (small bowel obstruction) (HCC) Resolved.  Patient tolerating soft bland food.  AKI (acute kidney injury) (HCC) in the setting of stage IIIa chronic kidney disease Likely multifactorial in the setting of poor p.o. intake after emesis yesterday.  There is also some component of the renal stone causing chronic hydronephrosis.  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  Initially held home antihypertensives and with blood pressure trending upward, will restart.  Type 2 diabetes mellitus (HCC) -  Holding home oral antiglycemic therapy, CBG stable with sliding scale  Nephrolithiasis Patient has a history of nephrolithiasis with CT imaging notable for large calculus with moderate proximal left hydroureteronephrosis.  Urology feels that this is chronic, but stent placement would benefit remaining renal function in that kidney.  After some consideration, patient amenable and urology seeing the patient can be added to the OR schedule.  Obesity Meets criteria BMI greater than 30       Body mass index is 33.23 kg/m.        Consultants: Urology General surgery  Procedures: None  Antimicrobials: IV Rocephin 10/29-present  Code Status: Full code   Subjective: Complains of nausea  Objective:  Vitals:   07/25/22 0441 07/25/22 0748  BP: (!) 146/80 (!) 161/82  Pulse: 80 73  Resp: 17 16  Temp: 99.6 F (37.6 C) 99.1 F (37.3 C)  SpO2: 95% 95%    Intake/Output Summary (Last 24 hours) at 07/25/2022 1403 Last data filed at 07/25/2022 0614 Gross per 24 hour  Intake 420 ml  Output 1200 ml  Net -780 ml    Filed Weights   07/23/22 0020  Weight: 102.1 kg   Body mass index is 33.23 kg/m.  Exam:  General: Alert and oriented x2, no acute distress HEENT: Normocephalic, atraumatic, mucous membranes slightly dry Cardiovascular: Regular rate and rhythm, S1-S2 Respiratory: Clear to auscultation bilaterally Abdomen: Soft, nontender, nondistended, positive bowel sounds in Musculoskeletal: No clubbing or cyanosis or edema Skin: No skin breaks, tears or lesions Psychiatry: Patient is appropriate, no evidence of psychoses Neurology: Residual right-sided hemiparesis  Data Reviewed: Improved to 1.88, hemoglobin stable    Disposition:  Status is: Inpatient     Anticipated discharge date: 11/2  Remaining issues to be resolved so that patient can be discharged: -Ureteral stent placement   Family Communication: We will call family DVT Prophylaxis:    Lovenox    Author:  K  ,MD 07/25/2022 2:03 PM  To reach On-call, see care teams to locate the attending and reach out via [email or web address]. Between 7PM-7AM, please contact night-coverage If you still have difficulty reaching the attending provider, please page the DOC (Director on Call) for [facility] on amion for assistance.  

## 2022-07-25 NOTE — Plan of Care (Signed)
  Problem: Education: Goal: Knowledge of General Education information will improve Description: Including pain rating scale, medication(s)/side effects and non-pharmacologic comfort measures Outcome: Progressing   Problem: Skin Integrity: Goal: Risk for impaired skin integrity will decrease Outcome: Progressing   

## 2022-07-25 NOTE — Progress Notes (Signed)
Mr. Wander was scheduled for cystoscopy with ureteral stent placement today.  He did not sign his consent until he had spoken with me in more detail.  I discussed that he has had obstruction of his left kidney for at least 2 years secondary to a ureteral calculus.  His CT performed on admission does show significant renal parenchymal loss and the function of that kidney is unknown.  I had discussed stent placement and subsequent ureteroscopy to possibly preserve any remaining renal function however if his renal function is less than 20% prior to the procedure he would not receive any significant benefit.  He was unsure what he wanted to do and I recommended he take time to think about and discuss with family and the procedure was canceled.  Will schedule a renal scan as an outpatient or if he is going to be in the hospital longer and this can be done as an inpatient.

## 2022-07-25 NOTE — Progress Notes (Signed)
Informed by day surgery of an opening for procedure this am and request to send pt down earlier for procedure. However pt again stated that he wanted to wait until he speaks with doctor regarding procedure.

## 2022-07-26 ENCOUNTER — Inpatient Hospital Stay: Payer: Medicare Other

## 2022-07-26 DIAGNOSIS — N179 Acute kidney failure, unspecified: Secondary | ICD-10-CM | POA: Diagnosis not present

## 2022-07-26 DIAGNOSIS — I10 Essential (primary) hypertension: Secondary | ICD-10-CM | POA: Diagnosis not present

## 2022-07-26 DIAGNOSIS — K56609 Unspecified intestinal obstruction, unspecified as to partial versus complete obstruction: Secondary | ICD-10-CM | POA: Diagnosis not present

## 2022-07-26 DIAGNOSIS — N200 Calculus of kidney: Secondary | ICD-10-CM | POA: Diagnosis not present

## 2022-07-26 LAB — GLUCOSE, CAPILLARY
Glucose-Capillary: 139 mg/dL — ABNORMAL HIGH (ref 70–99)
Glucose-Capillary: 137 mg/dL — ABNORMAL HIGH (ref 70–99)
Glucose-Capillary: 117 mg/dL — ABNORMAL HIGH (ref 70–99)
Glucose-Capillary: 93 mg/dL (ref 70–99)

## 2022-07-26 MED ORDER — HYDRALAZINE HCL 20 MG/ML IJ SOLN
5.0000 mg | INTRAMUSCULAR | Status: DC | PRN
  Administered 2022-07-27 – 2022-08-03 (×5): 5 mg via INTRAVENOUS
  Filled 2022-07-26 (×5): qty 1

## 2022-07-26 MED ORDER — SODIUM CHLORIDE 0.9 % IV SOLN
12.5000 mg | Freq: Four times a day (QID) | INTRAVENOUS | Status: DC | PRN
  Administered 2022-07-26 – 2022-07-28 (×2): 12.5 mg via INTRAVENOUS
  Filled 2022-07-26: qty 0.5
  Filled 2022-07-26: qty 12.5

## 2022-07-26 MED ORDER — SODIUM CHLORIDE 0.9 % IV SOLN
1.0000 g | Freq: Once | INTRAVENOUS | Status: DC
  Filled 2022-07-26: qty 10

## 2022-07-26 NOTE — Progress Notes (Signed)
Through out the shift patient vomiting green and brown bowel, with watery brown stool. Patient given PRN antinausea medication.

## 2022-07-26 NOTE — Progress Notes (Signed)
[facility] Progress Note  Patient: Rodney Estrada    HWE:[id number]  DOA: 07/23/2022    Date of Service: the patient was seen and examined on 07/26/2022  Brief hospital course: 71 year old male with past medical history of CVA with left-sided hemiparesis, hypertension and depression who presented to the emergency room on 10/29 with 1 episode of emesis.  Patient found to have partial versus early small bowel obstruction that look to be mild.  Patient also noted to have large calculus with moderate proximal left hydroureteronephrosis.  Patient seen by urology who felt findings were chronic.  Patient seen by general surgery who felt a small bowel obstruction could be managed conservatively.  By 10/30, obstruction felt to have resolved and patient started on clear liquids.  Diet advanced and by 10/31, tolerating soft bland food.  However, starting evening of 10/31, patient started having nausea and vomiting.  Repeat x-ray on 11/20 noted recurrence of small bowel obstruction.  Urology met with patient on 10/31 about placing stent.  Recommendation is to have it done sooner than later to preserve remaining renal function as long as it is more than 20%.  After some consideration, patient amenable, and urology plans to take patient to OR on 11/2.    Assessment and Plan: Assessment and Plan: * SBO (small bowel obstruction) ([place]) Initially looks to have resolved, now recurrent.  Resume n.p.o. and if no better by 11/2, NG tube placement  AKI (acute kidney injury) ([place]) in the setting of stage IIIa chronic kidney disease Likely multifactorial in the setting of poor p.o. intake after emesis yesterday.  There is also some component of the renal stone causing chronic hydronephrosis.  With fluids, creatinine up to 1.8.  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  Essential  hypertension Patient's blood pressure was initially low on admission and is currently receiving IV fluids.  Now hypertensive, but now that he is n.p.o. again, IV hydralazine as needed  Type 2 diabetes mellitus ([place]) - Holding home oral antiglycemic therapy, CBG stable with sliding scale  Nephrolithiasis Patient has a history of nephrolithiasis with CT imaging notable for large calculus with moderate proximal left hydroureteronephrosis.  Urology feels that this is chronic, but stent placement would benefit remaining renal function in that kidney.  To be done 11/2  Obesity Meets criteria BMI greater than 30       Body mass index is 33.23 kg/m.        Consultants: Urology General surgery  Procedures: None  Antimicrobials: IV Rocephin 10/29-present  Code Status: Full code   Subjective: Complains of nausea and vomiting  Objective:  Vitals:   07/26/22 0346 07/26/22 0832  BP: (!) 166/86 (!) 163/98  Pulse: 69 78  Resp: 20 18  Temp: 98.9 F (37.2 C) 98.8 F (37.1 C)  SpO2: 95% 95%    Intake/Output Summary (Last 24 hours) at 07/26/2022 1447 Last data filed at 07/26/2022 1010 Gross per 24 hour  Intake 180 ml  Output 800 ml  Net -620 ml    Filed Weights   07/23/22 0020  Weight: 102.1 kg   Body mass index is 33.23 kg/m.  Exam:  General: Alert and oriented x2, no acute distress HEENT: Normocephalic, atraumatic, mucous membranes slightly dry Cardiovascular: Regular rate and rhythm, S1-S2 Respiratory: Clear to auscultation bilaterally Abdomen: Soft, generalized tenderness, mild distention, absent bowel sounds Musculoskeletal: No clubbing or cyanosis  or edema Skin: No skin breaks, tears or lesions Psychiatry: Patient is appropriate, no evidence of psychoses Neurology: Residual right-sided hemiparesis  Data Reviewed: X-ray noting recurrent SBO  Disposition:  Status is: Inpatient     Anticipated discharge date: 11/4  Remaining issues to be resolved so  that patient can be discharged: -Ureteral stent placement -Resolution of small bowel obstruction   Family Communication: We will call family DVT Prophylaxis:   Lovenox    Author: Annita Brod ,MD 07/26/2022 2:47 PM  To reach On-call, see care teams to locate the attending and reach out via [email or web address]. Between 7PM-7AM, please contact night-coverage If you still have difficulty reaching the attending provider, please page the [facility] (Director on Call) for [facility] on amion for assistance.

## 2022-07-27 ENCOUNTER — Inpatient Hospital Stay: Payer: Medicare Other

## 2022-07-27 ENCOUNTER — Encounter: Payer: Self-pay | Admitting: Anesthesiology

## 2022-07-27 ENCOUNTER — Encounter
Admission: EM | Disposition: A | Discharge status: Skilled Nursing Facility | Payer: Self-pay | Source: Skilled Nursing Facility | Attending: Internal Medicine

## 2022-07-27 DIAGNOSIS — E876 Hypokalemia: Secondary | ICD-10-CM | POA: Diagnosis not present

## 2022-07-27 DIAGNOSIS — I10 Essential (primary) hypertension: Secondary | ICD-10-CM | POA: Diagnosis not present

## 2022-07-27 DIAGNOSIS — N179 Acute kidney failure, unspecified: Secondary | ICD-10-CM | POA: Diagnosis not present

## 2022-07-27 DIAGNOSIS — N200 Calculus of kidney: Secondary | ICD-10-CM | POA: Diagnosis not present

## 2022-07-27 DIAGNOSIS — K56609 Unspecified intestinal obstruction, unspecified as to partial versus complete obstruction: Secondary | ICD-10-CM | POA: Diagnosis not present

## 2022-07-27 LAB — BASIC METABOLIC PANEL WITH GFR
Anion gap: 11 (ref 5–15)
BUN: 28 mg/dL — ABNORMAL HIGH (ref 8–23)
CO2: 22 mmol/L (ref 22–32)
Calcium: 8.9 mg/dL (ref 8.9–10.3)
Chloride: 108 mmol/L (ref 98–111)
Creatinine, Ser: 1.56 mg/dL — ABNORMAL HIGH (ref 0.61–1.24)
GFR, Estimated: 48 mL/min — ABNORMAL LOW
Glucose, Bld: 106 mg/dL — ABNORMAL HIGH (ref 70–99)
Potassium: 3 mmol/L — ABNORMAL LOW (ref 3.5–5.1)
Sodium: 141 mmol/L (ref 135–145)

## 2022-07-27 LAB — URINE DRUG SCREEN, QUALITATIVE (ARMC ONLY)
Amphetamines, Ur Screen: NOT DETECTED
Barbiturates, Ur Screen: NOT DETECTED
Benzodiazepine, Ur Scrn: NOT DETECTED
Cannabinoid 50 Ng, Ur ~~LOC~~: NOT DETECTED
Cocaine Metabolite,Ur ~~LOC~~: NOT DETECTED
MDMA (Ecstasy)Ur Screen: NOT DETECTED
Methadone Scn, Ur: NOT DETECTED
Opiate, Ur Screen: NOT DETECTED
Phencyclidine (PCP) Ur S: NOT DETECTED
Tricyclic, Ur Screen: NOT DETECTED

## 2022-07-27 LAB — CBC
HCT: 36.7 % — ABNORMAL LOW (ref 39.0–52.0)
Hemoglobin: 11.9 g/dL — ABNORMAL LOW (ref 13.0–17.0)
MCH: 26.9 pg (ref 26.0–34.0)
MCHC: 32.4 g/dL (ref 30.0–36.0)
MCV: 82.8 fL (ref 80.0–100.0)
Platelets: 225 10*3/uL (ref 150–400)
RBC: 4.43 MIL/uL (ref 4.22–5.81)
RDW: 15 % (ref 11.5–15.5)
WBC: 8.1 10*3/uL (ref 4.0–10.5)
nRBC: 0 % (ref 0.0–0.2)

## 2022-07-27 LAB — GLUCOSE, CAPILLARY
Glucose-Capillary: 121 mg/dL — ABNORMAL HIGH (ref 70–99)
Glucose-Capillary: 108 mg/dL — ABNORMAL HIGH (ref 70–99)
Glucose-Capillary: 125 mg/dL — ABNORMAL HIGH (ref 70–99)
Glucose-Capillary: 105 mg/dL — ABNORMAL HIGH (ref 70–99)

## 2022-07-27 SURGERY — CYSTOSCOPY, WITH STENT INSERTION
Anesthesia: General | Laterality: Left

## 2022-07-27 MED ORDER — PROPOFOL 10 MG/ML IV BOLUS
INTRAVENOUS | Status: AC
  Filled 2022-07-27: qty 20

## 2022-07-27 MED ORDER — LIDOCAINE HCL (PF) 2 % IJ SOLN
INTRAMUSCULAR | Status: AC
  Filled 2022-07-27: qty 5

## 2022-07-27 MED ORDER — POTASSIUM CHLORIDE IN NACL 40-0.9 MEQ/L-% IV SOLN
INTRAVENOUS | Status: DC
  Filled 2022-07-27 (×7): qty 1000

## 2022-07-27 MED ORDER — FENTANYL CITRATE (PF) 100 MCG/2ML IJ SOLN
INTRAMUSCULAR | Status: AC
  Filled 2022-07-27: qty 2

## 2022-07-27 MED ORDER — MIDAZOLAM HCL 2 MG/2ML IJ SOLN
INTRAMUSCULAR | Status: AC
  Filled 2022-07-27: qty 2

## 2022-07-27 SURGICAL SUPPLY — 19 items
BAG DRAIN SIEMENS DORNER NS (MISCELLANEOUS) ×1 IMPLANT
BRUSH SCRUB EZ 1% IODOPHOR (MISCELLANEOUS) ×1 IMPLANT
CATH URETL OPEN 5X70 (CATHETERS) IMPLANT
GAUZE 4X4 16PLY ~~LOC~~+RFID DBL (SPONGE) ×2 IMPLANT
GLOVE SURG UNDER POLY LF SZ7.5 (GLOVE) ×1 IMPLANT
GOWN STRL REUS W/ TWL XL LVL3 (GOWN DISPOSABLE) ×1 IMPLANT
GOWN STRL REUS W/TWL XL LVL3 (GOWN DISPOSABLE) ×1
GUIDEWIRE STR DUAL SENSOR (WIRE) ×1 IMPLANT
IV NS IRRIG 3000ML ARTHROMATIC (IV SOLUTION) ×1 IMPLANT
KIT TURNOVER CYSTO (KITS) ×1 IMPLANT
MANIFOLD NEPTUNE II (INSTRUMENTS) ×1 IMPLANT
PACK CYSTO AR (MISCELLANEOUS) ×1 IMPLANT
SET CYSTO W/LG BORE CLAMP LF (SET/KITS/TRAYS/PACK) ×1 IMPLANT
STENT URET 6FRX24 CONTOUR (STENTS) IMPLANT
STENT URET 6FRX26 CONTOUR (STENTS) IMPLANT
SURGILUBE 2OZ TUBE FLIPTOP (MISCELLANEOUS) ×1 IMPLANT
TRAP FLUID SMOKE EVACUATOR (MISCELLANEOUS) ×1 IMPLANT
WATER STERILE IRR 1000ML POUR (IV SOLUTION) ×1 IMPLANT
WATER STERILE IRR 500ML POUR (IV SOLUTION) ×1 IMPLANT

## 2022-07-27 NOTE — TOC Progression Note (Signed)
Transition of Care [facility]) - Progression Note    Patient Details  Name: Rodney Estrada MRN: [id number] Date of Birth: June 03, 1951  Transition of [facility]) CM/SW Contact  Beverly Sessions, RN Phone Number: 07/27/2022, 10:54 AM  Clinical Narrative:     Per MD patient not medically ready for discharge.  Ricky at [employer] updated        Expected Discharge Plan and Services                                                 Social Determinants of Health (SDOH) Interventions    Readmission Risk Interventions     No data to display

## 2022-07-27 NOTE — Assessment & Plan Note (Signed)
Likely secondary to GI losses from SBO.  Changed IV fluids to add potassium

## 2022-07-27 NOTE — Progress Notes (Signed)
[facility] Progress Note  Patient: Rodney Estrada    YDX:[id number]  DOA: 07/23/2022    Date of Service: the patient was seen and examined on 07/27/2022  Brief hospital course: 71 year old male with past medical history of CVA with left-sided hemiparesis, hypertension and depression who presented to the emergency room on 10/29 with 1 episode of emesis.  Patient found to have partial versus early small bowel obstruction that look to be mild.  Patient also noted to have large calculus with moderate proximal left hydroureteronephrosis.  Patient seen by urology who felt findings were chronic.  Patient seen by general surgery who felt a small bowel obstruction could be managed conservatively.  By 10/30, obstruction felt to have resolved and patient started on clear liquids.  Diet advanced and by 10/31, tolerating soft bland food.  However, starting evening of 10/31, patient started having nausea and vomiting.  Repeat x-ray on 11/20 noted recurrence of small bowel obstruction.  Urology met with patient on 10/31 about placing stent.  Recommendation is to have it done sooner than later to preserve remaining renal function as long as it is more than 20%.  After some consideration, patient amenable, and urology plans to take patient to OR once SBO resolves  Assessment and Plan: Assessment and Plan: * SBO (small bowel obstruction) ([place]) Initially looks to have resolved, now recurrent.  Will place NG tube given persistence of SBO.  N.p.o.  AKI (acute kidney injury) ([place]) in the setting of stage IIIa chronic kidney disease Likely multifactorial in the setting of poor p.o. intake after emesis yesterday.  There is also some component of the renal stone causing chronic hydronephrosis.  Creatinine continues to improve.  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  Essential  hypertension Patient's blood pressure was initially low on admission and is currently receiving IV fluids.  Now hypertensive, but now that he is n.p.o. again, IV hydralazine as needed  Type 2 diabetes mellitus ([place]) - Holding home oral antiglycemic therapy, CBG stable with sliding scale  Nephrolithiasis Patient has a history of nephrolithiasis with CT imaging notable for large calculus with moderate proximal left hydroureteronephrosis.  Urology feels that this is chronic, but stent placement would benefit remaining renal function in that kidney.  To be done once SBO resolves  Obesity Meets criteria BMI greater than 30  Hypokalemia Likely secondary to GI losses from SBO.  Changed IV fluids to add potassium       Body mass index is 33.23 kg/m.        Consultants: Urology General surgery  Procedures: Planned stent placement  Antimicrobials: IV Rocephin 10/29-present  Code Status: Full code   Subjective: Continued nausea, abdominal discomfort  Objective:  Vitals:   07/27/22 0426 07/27/22 0826  BP: (!) 153/99 (!) 153/79  Pulse: 84 77  Resp: 20 16  Temp: 97.8 F (36.6 C) 97.7 F (36.5 C)  SpO2: 97% 98%    Intake/Output Summary (Last 24 hours) at 07/27/2022 1441 Last data filed at 07/27/2022 0826 Gross per 24 hour  Intake 150.22 ml  Output 1000 ml  Net -849.78 ml   Filed Weights   07/23/22 0020  Weight: 102.1 kg   Body mass index is 33.23 kg/m.  Exam:  General: Alert and oriented x2, no acute distress HEENT: Normocephalic, atraumatic, mucous membranes slightly dry Cardiovascular: Regular rate and rhythm, S1-S2 Respiratory: Clear to auscultation bilaterally  Abdomen: Soft, generalized tenderness, mild distention, absent bowel sounds Musculoskeletal: No clubbing or cyanosis or edema Skin: No skin breaks, tears or lesions Psychiatry: Patient is appropriate, no evidence of psychoses Neurology: Residual right-sided hemiparesis  Data Reviewed: X-ray  noting recurrent SBO Improving creatinine  Disposition:  Status is: Inpatient     Anticipated discharge date: 11/7  Remaining issues to be resolved so that patient can be discharged: -Ureteral stent placement -Resolution of small bowel obstruction   Family Communication: We will call family DVT Prophylaxis:   Lovenox    Author: Annita Brod ,MD 07/27/2022 2:41 PM  To reach On-call, see care teams to locate the attending and reach out via [email or web address]. Between 7PM-7AM, please contact night-coverage If you still have difficulty reaching the attending provider, please page the [facility] (Director on Call) for [facility] on amion for assistance.

## 2022-07-27 NOTE — Anesthesia Preprocedure Evaluation (Addendum)
Anesthesia Evaluation  Patient identified by MRN, date of birth, ID band Patient awake    Reviewed: NPO status , Patient's Chart, lab work & pertinent test results, Unable to perform ROS - Chart review onlyPreop documentation limited or incomplete due to emergent nature of procedure.  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 CVA, Residual Symptoms    GI/Hepatic   Endo/Other  diabetes, Type 2    Renal/GU Renal InsufficiencyRenal diseaseLeft Ureteral Stone     Musculoskeletal   Abdominal Normal abdominal exam  (+)   Peds  Hematology  (+) Blood dyscrasia, anemia   Anesthesia Other Findings Abd Xray: Small bowel obstruction  Reproductive/Obstetrics                             Anesthesia Physical Anesthesia Plan  ASA: III  Anesthesia Plan:    Post-op Pain Management:    Induction:   PONV Risk Score and Plan:   Airway Management Planned:   Additional Equipment:   Intra-op Plan:   Post-operative Plan:   Informed Consent:   Plan Discussed with:   Anesthesia Plan Comments: (Pt arrived to pre-op actively with hiccups/belching. KUB looks unchanged from yesterday with SBO. I discussed cancellation until SBO resolved with patient and Dr. Bernardo Heater who agree. Dr. Bernardo Heater does not feel this is an urgent procedure. )        Anesthesia Quick Evaluation

## 2022-07-27 NOTE — Progress Notes (Signed)
Anesthesia concerns of increased risk with SBO and aspiration and recommended cancellation.  Since this is not an urgent procedure the case was canceled.  Dependent on estimated hospitalization can potentially do next week or schedule as an outpatient.

## 2022-07-27 NOTE — Plan of Care (Signed)

## 2022-07-28 ENCOUNTER — Inpatient Hospital Stay: Payer: Medicare Other

## 2022-07-28 DIAGNOSIS — R066 Hiccough: Secondary | ICD-10-CM | POA: Diagnosis not present

## 2022-07-28 DIAGNOSIS — K56609 Unspecified intestinal obstruction, unspecified as to partial versus complete obstruction: Secondary | ICD-10-CM | POA: Diagnosis not present

## 2022-07-28 DIAGNOSIS — N1831 Chronic kidney disease, stage 3a: Secondary | ICD-10-CM | POA: Diagnosis not present

## 2022-07-28 DIAGNOSIS — N179 Acute kidney failure, unspecified: Secondary | ICD-10-CM | POA: Diagnosis not present

## 2022-07-28 LAB — BASIC METABOLIC PANEL
Anion gap: 9 (ref 5–15)
BUN: 21 mg/dL (ref 8–23)
CO2: 20 mmol/L — ABNORMAL LOW (ref 22–32)
Calcium: 8.6 mg/dL — ABNORMAL LOW (ref 8.9–10.3)
Chloride: 115 mmol/L — ABNORMAL HIGH (ref 98–111)
Creatinine, Ser: 1.39 mg/dL — ABNORMAL HIGH (ref 0.61–1.24)
GFR, Estimated: 55 mL/min — ABNORMAL LOW (ref >60)
Glucose, Bld: 116 mg/dL — ABNORMAL HIGH (ref 70–99)
Potassium: 3.8 mmol/L (ref 3.5–5.1)
Sodium: 144 mmol/L (ref 135–145)

## 2022-07-28 LAB — GLUCOSE, CAPILLARY
Glucose-Capillary: 112 mg/dL — ABNORMAL HIGH (ref 70–99)
Glucose-Capillary: 114 mg/dL — ABNORMAL HIGH (ref 70–99)
Glucose-Capillary: 106 mg/dL — ABNORMAL HIGH (ref 70–99)
Glucose-Capillary: 100 mg/dL — ABNORMAL HIGH (ref 70–99)

## 2022-07-28 LAB — CBC
HCT: 34.6 % — ABNORMAL LOW (ref 39.0–52.0)
Hemoglobin: 11.1 g/dL — ABNORMAL LOW (ref 13.0–17.0)
MCH: 26.8 pg (ref 26.0–34.0)
MCHC: 32.1 g/dL (ref 30.0–36.0)
MCV: 83.6 fL (ref 80.0–100.0)
Platelets: 214 10*3/uL (ref 150–400)
RBC: 4.14 MIL/uL — ABNORMAL LOW (ref 4.22–5.81)
RDW: 14.9 % (ref 11.5–15.5)
WBC: 9.4 10*3/uL (ref 4.0–10.5)
nRBC: 0 % (ref 0.0–0.2)

## 2022-07-28 MED ORDER — CHLORPROMAZINE HCL 25 MG/ML IJ SOLN
25.0000 mg | Freq: Three times a day (TID) | INTRAMUSCULAR | Status: DC | PRN
  Administered 2022-07-28: 25 mg via INTRAMUSCULAR
  Filled 2022-07-28 (×2): qty 1

## 2022-07-28 NOTE — Assessment & Plan Note (Signed)
Likely secondary to diaphragmatic irritation brought on by small bowel structuring.  IV Thorazine.

## 2022-07-28 NOTE — Progress Notes (Addendum)
Patient off unit via bed with transport to IR xray for placement of Ngt. [phone] Patient returned to floor. Ngt to right nostil in place.

## 2022-07-28 NOTE — Progress Notes (Addendum)
Patient pulled out PIV, early this shift, patient observed attempting to pull of NGT several times, unable to redirect, patient constantly repositioning self to be able to pull on NGT with his right hand. Bilateral mitts applied. 1815 mitts removed and skin check completed.

## 2022-07-28 NOTE — Progress Notes (Signed)
[facility] Progress Note  Patient: Rodney Estrada    TIR:[id number]  DOA: 07/23/2022    Date of Service: the patient was seen and examined on 07/28/2022  Brief hospital course: 71 year old male with past medical history of CVA with left-sided hemiparesis, hypertension and depression who presented to the emergency room on 10/29 with 1 episode of emesis.  Patient found to have partial versus early small bowel obstruction that look to be mild.  Patient also noted to have large calculus with moderate proximal left hydroureteronephrosis.  Patient seen by urology who felt findings were chronic.  Patient seen by general surgery who felt a small bowel obstruction could be managed conservatively.  By 10/30, obstruction felt to have resolved and patient started on clear liquids.  Diet advanced and by 10/31, tolerating soft bland food.  However, starting evening of 10/31, patient started having nausea and vomiting.  Repeat x-ray on 11/1 noted recurrence of small bowel obstruction.  With persistent obstruction, NG tube placed by interventional radiology 11/3.  Urology met with patient on 10/31 about placing stent.  Recommendation is to have it done sooner than later to preserve remaining renal function as long as it is more than 20%.  After some consideration, patient amenable, and urology plans to take patient to OR once SBO resolves  Assessment and Plan: Assessment and Plan: * SBO (small bowel obstruction) ([place]) Initially looks to have resolved, now recurrent.  NPO.  NG tube placed by interventional radiology on morning of 11/3.  General surgery to follow-up if no improvement by 11/4.  AKI (acute kidney injury) ([place]) in the setting of stage IIIa chronic kidney disease Likely multifactorial in the setting of poor p.o. intake after emesis yesterday.  There is also some component of the renal stone causing chronic hydronephrosis.  Much improved with IV fluids.  Creatinine now at baseline.  Continue to follow  labs.  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  Essential hypertension Patient's blood pressure was initially low on admission and is currently receiving IV fluids.  Now hypertensive, but now that he is n.p.o. again, IV hydralazine as needed  Type 2 diabetes mellitus ([place]) - Holding home oral antiglycemic therapy, CBG stable with sliding scale  Nephrolithiasis Patient has a history of nephrolithiasis with CT imaging notable for large calculus with moderate proximal left hydroureteronephrosis.  Urology feels that this is chronic, but stent placement would benefit remaining renal function in that kidney.  To be done once SBO resolves  Obesity Meets criteria BMI greater than 30  Hiccups Likely secondary to diaphragmatic irritation brought on by small bowel structuring.  IV Thorazine.  Hypokalemia Likely secondary to GI losses from SBO.  Changed IV fluids to add potassium       Body mass index is 33.23 kg/m.        Consultants: Urology General surgery Interventional radiology  Procedures: Planned stent placement NG tube placement 11/3  Antimicrobials: IV Rocephin 10/29-11/2  Code Status: Full code   Subjective: Patient complains of hiccups  Objective:  Vitals:   07/28/22 1100 07/28/22 1514  BP: (!) 140/74 (!) 146/70  Pulse:  80  Resp:  17  Temp:  98.3 F (36.8 C)  SpO2:  98%    Intake/Output Summary (Last 24 hours) at 07/28/2022 1733 Last data filed at 07/28/2022 1355 Gross per 24 hour  Intake 2493.2 ml  Output 700 ml  Net 1793.2 ml  Filed Weights   07/23/22 0020  Weight: 102.1 kg   Body mass index is 33.23 kg/m.  Exam:  General: Alert and oriented x2, no acute distress HEENT: Normocephalic, atraumatic, mucous membranes slightly dry Cardiovascular: Regular rate and rhythm, S1-S2 Respiratory: Clear to auscultation bilaterally Abdomen: Soft,  generalized tenderness, mild distention, absent bowel sounds Musculoskeletal: No clubbing or cyanosis or edema Skin: No skin breaks, tears or lesions Psychiatry: Patient is appropriate, no evidence of psychoses Neurology: Residual right-sided hemiparesis  Data Reviewed: X-ray noting recurrent SBO Improving creatinine  Disposition:  Status is: Inpatient     Anticipated discharge date: 11/7  Remaining issues to be resolved so that patient can be discharged: -Ureteral stent placement -Resolution of small bowel obstruction   Family Communication: We will call family DVT Prophylaxis:   Lovenox    Author: Annita Brod ,MD 07/28/2022 5:33 PM  To reach On-call, see care teams to locate the attending and reach out via [email or web address]. Between 7PM-7AM, please contact night-coverage If you still have difficulty reaching the attending provider, please page the [facility] (Director on Call) for [facility] on amion for assistance.

## 2022-07-29 ENCOUNTER — Inpatient Hospital Stay: Payer: Medicare Other

## 2022-07-29 DIAGNOSIS — N200 Calculus of kidney: Secondary | ICD-10-CM | POA: Diagnosis not present

## 2022-07-29 DIAGNOSIS — N179 Acute kidney failure, unspecified: Secondary | ICD-10-CM | POA: Diagnosis not present

## 2022-07-29 DIAGNOSIS — K56609 Unspecified intestinal obstruction, unspecified as to partial versus complete obstruction: Secondary | ICD-10-CM | POA: Diagnosis not present

## 2022-07-29 LAB — CBC
HCT: 36 % — ABNORMAL LOW (ref 39.0–52.0)
Hemoglobin: 11.5 g/dL — ABNORMAL LOW (ref 13.0–17.0)
MCH: 26.9 pg (ref 26.0–34.0)
MCHC: 31.9 g/dL (ref 30.0–36.0)
MCV: 84.3 fL (ref 80.0–100.0)
Platelets: 220 10*3/uL (ref 150–400)
RBC: 4.27 MIL/uL (ref 4.22–5.81)
RDW: 15.1 % (ref 11.5–15.5)
WBC: 9.5 10*3/uL (ref 4.0–10.5)
nRBC: 0 % (ref 0.0–0.2)

## 2022-07-29 LAB — GLUCOSE, CAPILLARY
Glucose-Capillary: 101 mg/dL — ABNORMAL HIGH (ref 70–99)
Glucose-Capillary: 109 mg/dL — ABNORMAL HIGH (ref 70–99)
Glucose-Capillary: 125 mg/dL — ABNORMAL HIGH (ref 70–99)
Glucose-Capillary: 129 mg/dL — ABNORMAL HIGH (ref 70–99)

## 2022-07-29 LAB — BASIC METABOLIC PANEL
Anion gap: 6 (ref 5–15)
BUN: 18 mg/dL (ref 8–23)
CO2: 20 mmol/L — ABNORMAL LOW (ref 22–32)
Calcium: 9.1 mg/dL (ref 8.9–10.3)
Chloride: 117 mmol/L — ABNORMAL HIGH (ref 98–111)
Creatinine, Ser: 1.33 mg/dL — ABNORMAL HIGH (ref 0.61–1.24)
GFR, Estimated: 58 mL/min — ABNORMAL LOW (ref >60)
Glucose, Bld: 96 mg/dL (ref 70–99)
Potassium: 4 mmol/L (ref 3.5–5.1)
Sodium: 143 mmol/L (ref 135–145)

## 2022-07-29 MED ORDER — LACTULOSE 10 GM/15ML PO SOLN
20.0000 g | Freq: Two times a day (BID) | ORAL | Status: DC
  Administered 2022-07-29 (×2): 20 g via ORAL
  Filled 2022-07-29 (×2): qty 30

## 2022-07-29 MED ORDER — KCL-LACTATED RINGERS-D5W 20 MEQ/L IV SOLN
INTRAVENOUS | Status: DC
  Filled 2022-07-29 (×4): qty 1000

## 2022-07-29 NOTE — Progress Notes (Signed)
Progress Note   Patient: Rodney Estrada [id number]:[id number] DOB: Jul 06, 1951 DOA: 07/23/2022     4 DOS: the patient was seen and examined on 07/29/2022   Brief hospital course: 71 year old male with past medical history of CVA with left-sided hemiparesis, hypertension and depression who presented to the emergency room on 10/29 with 1 episode of emesis.  Patient found to have partial versus early small bowel obstruction that look to be mild.  Patient also noted to have large calculus with moderate proximal left hydroureteronephrosis.  Patient seen by urology who felt findings were chronic.  Patient seen by general surgery who felt a small bowel obstruction could be managed conservatively.  By 10/30, obstruction felt to have resolved and patient started on clear liquids.  Diet advanced and by 10/31, tolerating soft bland food.  However, starting evening of 10/31, patient started having nausea and vomiting.  Repeat x-ray on 11/1 noted recurrence of small bowel obstruction.  With persistent obstruction, NG tube placed by interventional radiology 11/3.  Urology met with patient on 10/31 about placing stent.  Recommendation is to have it done sooner than later to preserve remaining renal function as long as it is more than 20%.  After some consideration, patient amenable, and urology plans to take patient to OR once SBO resolves  Assessment and Plan: * SBO (small bowel obstruction) ([place]) Initially looks to have resolved, now recurrent. NG tube placed by interventional radiology on morning of 11/3.  General surgery to follow-up if no improvement by 11/4. Patient condition appears to be improving, repeat KUB much improved.  Based on examination, patient has decreased bowel sounds, he did not have history of abdominal surgery.  I will try some stool softener with lactulose twice a day.  Continue NG suction, continue n.p.o. and IV fluids.  AKI (acute kidney injury) ([place]) in the setting of stage IIIa chronic kidney  disease Hypokalemia. Mild metabolic acidosis. Likely multifactorial in the setting of poor p.o. intake after emesis yesterday.  There is also some component of the renal stone causing chronic hydronephrosis.   Renal function has back to baseline.  Due to n.p.o. status, will continue fluids.  IV fluid changed to lactated Ringer's solution due to mild metabolic acidosis.  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  Essential hypertension Continue as needed hydralazine.  Type 2 diabetes mellitus ([place]) - Holding home oral antiglycemic therapy, CBG stable with sliding scale  Nephrolithiasis Patient has a history of nephrolithiasis with CT imaging notable for large calculus with moderate proximal left hydroureteronephrosis.  Urology feels that this is chronic, but stent placement would benefit remaining renal function in that kidney.  To be done once SBO resolves  Obesity Meets criteria BMI greater than 30  Hiccups Improved       Subjective:  Patient feels better today, no nausea vomiting abdominal pain.  Physical Exam: Vitals:   07/28/22 1514 07/28/22 1959 07/29/22 0500 07/29/22 0840  BP: (!) 146/70 (!) 167/85 (!) 165/91 (!) 165/97  Pulse: 80 76 72 70  Resp: _0 Temp: 98.3 F (36.8 C) 98.5 F (36.9 C) 98.2 F (36.8 C) 98 F (36.7 C)  TempSrc: Oral Oral Oral Oral  SpO2: 98% 97% 98% 97%  Weight:      Height:       General exam: Appears calm and comfortable  Respiratory system: Clear to auscultation. Respiratory effort normal. Cardiovascular system: S1 & S2  heard, RRR. No JVD, murmurs, rubs, gallops or clicks. No pedal edema. Gastrointestinal system: Abdomen is nondistended, soft and nontender. No organomegaly or masses felt.  Decreased bowel sounds  central nervous system: Alert and oriented x2.  Left arm contracture. Extremities: Symmetric 5 x 5 power. Skin: No  rashes, lesions or ulcers Psychiatry: Mood & affect appropriate.   Data Reviewed:  Reviewed x-ray results, all lab results.  Family Communication:   Disposition: Status is: Inpatient Remains inpatient appropriate because: Severity of disease, IV treatment.  Planned Discharge Destination: Home with Home Health    Time spent: 35 minutes  Author: Sharen Hones, MD 07/29/2022 11:16 AM  For on call review [email or web address].

## 2022-07-29 NOTE — Plan of Care (Signed)
  Problem: Nutritional: Goal: Maintenance of adequate nutrition will improve Outcome: Progressing   Problem: Skin Integrity: Goal: Risk for impaired skin integrity will decrease Outcome: Progressing

## 2022-07-29 NOTE — Progress Notes (Signed)
[facility] responded to consult request for A.D paperwork per sister's request to update A.D. Please contact [place] if requested by pt.

## 2022-07-30 DIAGNOSIS — K56609 Unspecified intestinal obstruction, unspecified as to partial versus complete obstruction: Secondary | ICD-10-CM | POA: Diagnosis not present

## 2022-07-30 DIAGNOSIS — N200 Calculus of kidney: Secondary | ICD-10-CM | POA: Diagnosis not present

## 2022-07-30 DIAGNOSIS — N179 Acute kidney failure, unspecified: Secondary | ICD-10-CM | POA: Diagnosis not present

## 2022-07-30 LAB — BASIC METABOLIC PANEL
Anion gap: 9 (ref 5–15)
BUN: 18 mg/dL (ref 8–23)
CO2: 18 mmol/L — ABNORMAL LOW (ref 22–32)
Calcium: 9.4 mg/dL (ref 8.9–10.3)
Chloride: 119 mmol/L — ABNORMAL HIGH (ref 98–111)
Creatinine, Ser: 1.31 mg/dL — ABNORMAL HIGH (ref 0.61–1.24)
GFR, Estimated: 59 mL/min — ABNORMAL LOW (ref >60)
Glucose, Bld: 142 mg/dL — ABNORMAL HIGH (ref 70–99)
Potassium: 3.7 mmol/L (ref 3.5–5.1)
Sodium: 146 mmol/L — ABNORMAL HIGH (ref 135–145)

## 2022-07-30 LAB — CBC
HCT: 37.8 % — ABNORMAL LOW (ref 39.0–52.0)
Hemoglobin: 12.2 g/dL — ABNORMAL LOW (ref 13.0–17.0)
MCH: 27.2 pg (ref 26.0–34.0)
MCHC: 32.3 g/dL (ref 30.0–36.0)
MCV: 84.4 fL (ref 80.0–100.0)
Platelets: 266 10*3/uL (ref 150–400)
RBC: 4.48 MIL/uL (ref 4.22–5.81)
RDW: 15 % (ref 11.5–15.5)
WBC: 9.4 10*3/uL (ref 4.0–10.5)
nRBC: 0 % (ref 0.0–0.2)

## 2022-07-30 LAB — GLUCOSE, CAPILLARY
Glucose-Capillary: 145 mg/dL — ABNORMAL HIGH (ref 70–99)
Glucose-Capillary: 134 mg/dL — ABNORMAL HIGH (ref 70–99)
Glucose-Capillary: 118 mg/dL — ABNORMAL HIGH (ref 70–99)
Glucose-Capillary: 132 mg/dL — ABNORMAL HIGH (ref 70–99)

## 2022-07-30 LAB — MAGNESIUM: Magnesium: 2 mg/dL (ref 1.7–2.4)

## 2022-07-30 MED ORDER — SODIUM BICARBONATE 8.4 % IV SOLN
INTRAVENOUS | Status: AC
  Filled 2022-07-30 (×5): qty 1000

## 2022-07-30 NOTE — Progress Notes (Signed)
Patient has had two large, liquid bowel movements this morning.

## 2022-07-30 NOTE — Progress Notes (Addendum)
Progress Note   Patient: Rodney Estrada [id number] DOB: May 13, 1951 DOA: 07/23/2022     5 DOS: the patient was seen and examined on 07/30/2022   Brief hospital course: 71 year old male with past medical history of CVA with left-sided hemiparesis, hypertension and depression who presented to the emergency room on 10/29 with 1 episode of emesis.  Patient found to have partial versus early small bowel obstruction that look to be mild.  Patient also noted to have large calculus with moderate proximal left hydroureteronephrosis.  Patient seen by urology who felt findings were chronic.  Patient seen by general surgery who felt a small bowel obstruction could be managed conservatively.  By 10/30, obstruction felt to have resolved and patient started on clear liquids.  Diet advanced and by 10/31, tolerating soft bland food.  However, starting evening of 10/31, patient started having nausea and vomiting.  Repeat x-ray on 11/1 noted recurrence of small bowel obstruction.  With persistent obstruction, NG tube placed by interventional radiology 11/3.  Urology met with patient on 10/31 about placing stent.  Recommendation is to have it done sooner than later to preserve remaining renal function as long as it is more than 20%.  After some consideration, patient amenable, and urology plans to take patient to OR once SBO resolves  Assessment and Plan: * SBO (small bowel obstruction) ([place]) Initially looks to have resolved, now recurrent. NG tube placed by interventional radiology on morning of 11/3.  General surgery to follow-up if no improvement by 11/4. Patient condition appears to be improving, repeat KUB much improved on 11/4.  He was given 2 doses of lactulose, had 2 bowel movements.  Currently condition seem to be improved.  We will try to start this liquid diet if no surgery scheduled by urology.  For now, continue fluids.   AKI (acute kidney injury) ([place]) in the setting of stage IIIa chronic kidney  disease Hypokalemia. Mild metabolic acidosis. Hypernatremia. Likely multifactorial in the setting of poor p.o. intake after emesis yesterday.  There is also some component of the renal stone causing chronic hydronephrosis.   He developed a mild hyponatremia, also slightly worsening metabolic acidosis.  Fluids changed to D5 water with 75 mEq of sodium bicarb with added 20 mEq KCl. Continue to follow electrolytes.  Renal function has improved.   Stroke (cerebrum) ([place]) - R MCA s/p TPA and mechanical thrombectomy w/ stent placement Resumed home aspirin, Plavix, statin.   Essential hypertension Continue as needed hydralazine.   Type 2 diabetes mellitus (HCC) Continue sliding scale insulin.   Nephrolithiasis Patient has a history of nephrolithiasis with CT imaging notable for large calculus with moderate proximal left hydroureteronephrosis.  Urology feels that this is chronic, but stent placement would benefit remaining renal function in that kidney.  To be done once SBO resolves  Obesity Meets criteria BMI greater than 30   Hiccups Improved      Subjective:  Patient doing better today, currently no abdominal pain nausea vomiting.  No fever or chills.  Physical Exam: Vitals:   07/29/22 0840 07/29/22 1936 07/30/22 0319 07/30/22 0730  BP: (!) 165/97 117/64 (!) 157/92 (!) 167/94  Pulse: 70 81 81 72  Resp: _0 Temp: 98 F (36.7 C) 98.2 F (36.8 C) (!) 97.5 F (36.4 C) 97.9 F (36.6 C)  TempSrc: Oral Oral Oral   SpO2: 97% 96% 98% 96%  Weight:      Height:       General exam: Appears calm and comfortable  Respiratory system: Clear to auscultation. Respiratory effort normal. Cardiovascular system: S1 & S2 heard, RRR. No JVD, murmurs, rubs, gallops or clicks. No pedal edema. Gastrointestinal system: Abdomen is nondistended, soft and nontender. No organomegaly or masses felt. Normal bowel sounds heard. Central nervous system: Alert and oriented. No focal neurological  deficits. Extremities: Symmetric 5 x 5 power. Skin: No rashes, lesions or ulcers Psychiatry: Judgement and insight appear normal. Mood & affect appropriate.   Data Reviewed:  Lab results reviewed.  Family Communication: Sister updated over the phone  Disposition: Status is: Inpatient Remains inpatient appropriate because: Severity of disease, IV treatment, pending surgery.  Planned Discharge Destination: Home with Home Health    Time spent: 35 minutes  Author:  , MD 07/30/2022 12:26 PM  For on call review [email or web address].  

## 2022-07-31 ENCOUNTER — Inpatient Hospital Stay: Payer: Medicare Other

## 2022-07-31 DIAGNOSIS — N200 Calculus of kidney: Secondary | ICD-10-CM | POA: Diagnosis not present

## 2022-07-31 DIAGNOSIS — N261 Atrophy of kidney (terminal): Secondary | ICD-10-CM

## 2022-07-31 DIAGNOSIS — K56609 Unspecified intestinal obstruction, unspecified as to partial versus complete obstruction: Secondary | ICD-10-CM | POA: Diagnosis not present

## 2022-07-31 DIAGNOSIS — N179 Acute kidney failure, unspecified: Secondary | ICD-10-CM | POA: Diagnosis not present

## 2022-07-31 DIAGNOSIS — E870 Hyperosmolality and hypernatremia: Secondary | ICD-10-CM | POA: Insufficient documentation

## 2022-07-31 LAB — CBC
HCT: 34.4 % — ABNORMAL LOW (ref 39.0–52.0)
Hemoglobin: 11.3 g/dL — ABNORMAL LOW (ref 13.0–17.0)
MCH: 26.8 pg (ref 26.0–34.0)
MCHC: 32.8 g/dL (ref 30.0–36.0)
MCV: 81.7 fL (ref 80.0–100.0)
Platelets: 248 10*3/uL (ref 150–400)
RBC: 4.21 MIL/uL — ABNORMAL LOW (ref 4.22–5.81)
RDW: 14.7 % (ref 11.5–15.5)
WBC: 11.7 10*3/uL — ABNORMAL HIGH (ref 4.0–10.5)
nRBC: 0 % (ref 0.0–0.2)

## 2022-07-31 LAB — BASIC METABOLIC PANEL
Anion gap: 9 (ref 5–15)
BUN: 12 mg/dL (ref 8–23)
CO2: 22 mmol/L (ref 22–32)
Calcium: 9 mg/dL (ref 8.9–10.3)
Chloride: 110 mmol/L (ref 98–111)
Creatinine, Ser: 1.3 mg/dL — ABNORMAL HIGH (ref 0.61–1.24)
GFR, Estimated: 59 mL/min — ABNORMAL LOW (ref >60)
Glucose, Bld: 153 mg/dL — ABNORMAL HIGH (ref 70–99)
Potassium: 3.6 mmol/L (ref 3.5–5.1)
Sodium: 141 mmol/L (ref 135–145)

## 2022-07-31 LAB — URINALYSIS, COMPLETE (UACMP) WITH MICROSCOPIC
Bilirubin Urine: NEGATIVE
Glucose, UA: NEGATIVE mg/dL
Ketones, ur: 5 mg/dL — AB
Nitrite: POSITIVE — AB
Protein, ur: 100 mg/dL — AB
RBC / HPF: >50 RBC/hpf — ABNORMAL HIGH (ref 0–5)
Specific Gravity, Urine: 1.016 (ref 1.005–1.030)
WBC, UA: >50 WBC/hpf — ABNORMAL HIGH (ref 0–5)
pH: 5 (ref 5.0–8.0)

## 2022-07-31 LAB — GLUCOSE, CAPILLARY
Glucose-Capillary: 161 mg/dL — ABNORMAL HIGH (ref 70–99)
Glucose-Capillary: 114 mg/dL — ABNORMAL HIGH (ref 70–99)
Glucose-Capillary: 138 mg/dL — ABNORMAL HIGH (ref 70–99)
Glucose-Capillary: 169 mg/dL — ABNORMAL HIGH (ref 70–99)

## 2022-07-31 LAB — MAGNESIUM: Magnesium: 1.6 mg/dL — ABNORMAL LOW (ref 1.7–2.4)

## 2022-07-31 MED ORDER — SODIUM CHLORIDE 0.9 % IV SOLN
1.0000 g | INTRAVENOUS | Status: DC
  Administered 2022-07-31: 1 g via INTRAVENOUS
  Filled 2022-07-31: qty 10
  Filled 2022-07-31: qty 1

## 2022-07-31 MED ORDER — MAGNESIUM SULFATE 2 GM/50ML IV SOLN
2.0000 g | Freq: Once | INTRAVENOUS | Status: AC
  Administered 2022-07-31: 2 g via INTRAVENOUS
  Filled 2022-07-31: qty 50

## 2022-07-31 MED ORDER — DIATRIZOATE MEGLUMINE & SODIUM 66-10 % PO SOLN
90.0000 mL | Freq: Once | ORAL | Status: AC
  Administered 2022-07-31: 90 mL via NASOGASTRIC

## 2022-07-31 NOTE — Progress Notes (Signed)
Progress Note   Patient: Rodney Estrada ZTI:[id number] DOB: [birth year] DOA: 07/23/2022     6 DOS: the patient was seen and examined on 07/31/2022   Brief hospital course: 71 year old male with past medical history of CVA with left-sided hemiparesis, hypertension and depression who presented to the emergency room on 10/29 with 1 episode of emesis.  Patient found to have partial versus early small bowel obstruction that look to be mild.  Patient also noted to have large calculus with moderate proximal left hydroureteronephrosis.  Patient seen by urology who felt findings were chronic.  Patient seen by general surgery who felt a small bowel obstruction could be managed conservatively.  By 10/30, obstruction felt to have resolved and patient started on clear liquids.  Diet advanced and by 10/31, tolerating soft bland food.  However, starting evening of 10/31, patient started having nausea and vomiting.  Repeat x-ray on 11/1 noted recurrence of small bowel obstruction.  With persistent obstruction, NG tube placed by interventional radiology 11/3.  Urology met with patient on 10/31 about placing stent.  Recommendation is to have it done sooner than later to preserve remaining renal function as long as it is more than 20%.  After some consideration, patient amenable, and urology plans to take patient to OR once SBO resolves.   Assessment and Plan:  SBO (small bowel obstruction) ([place]) Initially looks to have resolved, now recurrent. NG tube placed by interventional radiology on morning of 11/3.  General surgery to follow-up if no improvement by 11/4. Patient has been having bowel movements since the last time he given lactulose.  NG tube suction was clamped last night, repeated KUB today still had some bowel obstruction.  Discussed with general surgery, patient has been having bowel movements.  Dr. Lysle Pearl suspect patient's small bowel obstruction has resolved.  Small bowel follow-through is obtained.  Anticipating  starting a liquid diet today.   AKI (acute kidney injury) ([place]) in the setting of stage IIIa chronic kidney disease Hypokalemia. Mild metabolic acidosis. Hypernatremia. Likely multifactorial in the setting of poor p.o. intake after emesis yesterday.  There is also some component of the renal stone causing chronic hydronephrosis.   Fluids changed to D5 water with 75 mEq of sodium bicarb with added 20 mEq KCl.  Condition improving.  Renal function stable.  Continue IV fluids for now.  Obstructive nephrolithiasis with hydronephrosis. Patient has a history of nephrolithiasis with CT imaging notable for large calculus with moderate proximal left hydroureteronephrosis.  Urology feels that this is chronic, but stent placement would benefit remaining renal function in that kidney.  To be done once SBO resolves However, patient started have a fever last night, discussed with urology, will send a UA.  Urology will decide if emergent surgery is needed.  For now, I will start Rocephin.   Stroke (cerebrum) ([place]) - R MCA s/p TPA and mechanical thrombectomy w/ stent placement Resumed home aspirin, Plavix, statin.   Essential hypertension Continue as needed hydralazine.   Type 2 diabetes mellitus ([facility]) Continue sliding scale insulin.      Obesity Meets criteria BMI greater than 30   Hiccups Improved      Subjective:  Patient had a fever earlier this morning, had some confusion this morning.  Physical Exam: Vitals:   07/30/22 1551 07/30/22 1928 07/31/22 0348 07/31/22 0727  BP: (!) 157/86 (!) 150/74 (!) 157/74 138/78  Pulse: 64 74 80 81  Resp:  _0 Temp: 98.3 F (36.8 C) 98.2 F (36.8 C) (!)  100.9 F (38.3 C) 98.8 F (37.1 C)  TempSrc: Oral Oral Axillary Oral  SpO2: 98% 97% 100% 98%  Weight:      Height:       General exam: Appears calm and comfortable  Respiratory system: Clear to auscultation. Respiratory effort normal. Cardiovascular system: S1 & S2 heard, RRR. No JVD,  murmurs, rubs, gallops or clicks. No pedal edema. Gastrointestinal system: Abdomen is nondistended, soft and nontender. No organomegaly or masses felt. Normal bowel sounds heard. Central nervous system: Alert and oriented x2. No focal neurological deficits. Extremities: Symmetric 5 x 5 power. Skin: No rashes, lesions or ulcers Psychiatry: Mood & affect appropriate.   Data Reviewed:  Lab results reviewed.  Family Communication:   Disposition: Status is: Inpatient Remains inpatient appropriate because: Severity of disease, IV treatment.  Planned Discharge Destination: Home with Home Health    Time spent: 50 minutes  Author: Sharen Hones, MD 07/31/2022 12:56 PM  For on call review [email or web address].

## 2022-07-31 NOTE — Progress Notes (Signed)
Urology Inpatient Progress Note  Subjective: He developed a fever overnight, Tmax 38.3C. KUB with small, stable SBO. CXR with no acute findings. He is afebrile, VSS this morning.  WBC count up today, 11.7. Creatinine stable, 1.30. UA ordered but yet to be collected. He reports no pain or acute concerns today. Condom catheter in place draining clear, yellow urine.  Anti-infectives: Anti-infectives (From admission, onward)    Start     Dose/Rate Route Frequency Ordered Stop   07/27/22 0700  cefTRIAXone (ROCEPHIN) 1 g in sodium chloride 0.9 % 100 mL IVPB  Status:  Discontinued        1 g 200 mL/hr over 30 Minutes Intravenous  Once 07/26/22 1610 07/28/22 1007   07/26/22 1100  cefTRIAXone (ROCEPHIN) 1 g in sodium chloride 0.9 % 100 mL IVPB  Status:  Discontinued       Note to Pharmacy: Call   1 g 200 mL/hr over 30 Minutes Intravenous Every 24 hours 07/25/22 1403 07/26/22 1610   07/25/22 1200  cefTRIAXone (ROCEPHIN) 1 g in sodium chloride 0.9 % 100 mL IVPB       Note to Pharmacy: Call   1 g 200 mL/hr over 30 Minutes Intravenous  Once 07/24/22 1609 07/26/22 1208       Current Facility-Administered Medications  Medication Dose Route Frequency Provider Last Rate Last Admin   acetaminophen (TYLENOL) tablet 650 mg  650 mg Oral Q6H PRN Jose Persia, MD   650 mg at 07/31/22 0418   Or   acetaminophen (TYLENOL) suppository 650 mg  650 mg Rectal Q6H PRN Jose Persia, MD       alum & mag hydroxide-simeth (MAALOX/MYLANTA) 200-200-20 MG/5ML suspension 30 mL  30 mL Oral Q6H PRN Annita Brod, MD   30 mL at 07/31/22 0419   aspirin chewable tablet 81 mg  81 mg Oral Daily Jose Persia, MD   81 mg at 07/30/22 0819   atorvastatin (LIPITOR) tablet 40 mg  40 mg Oral QPM Jose Persia, MD   40 mg at 07/30/22 1717   chlorproMAZINE (THORAZINE) injection 25 mg  25 mg Intramuscular TID PRN Annita Brod, MD   25 mg at 07/28/22 2036   clopidogrel (PLAVIX) tablet 75 mg  75 mg Oral Daily  Jose Persia, MD   75 mg at 07/30/22 0819   dextrose 5 % 1,000 mL with potassium chloride 20 mEq, sodium bicarbonate 75 mEq infusion   Intravenous Continuous Lorna Dibble, RPH 75 mL/hr at 07/30/22 1641 Infusion Verify at 07/30/22 1641   diatrizoate meglumine-sodium (GASTROGRAFIN) 66-10 % solution 90 mL  90 mL Per NG tube Once Sakai, Isami, DO       enoxaparin (LOVENOX) injection 50 mg  0.5 mg/kg Subcutaneous Q24H Alison Murray, RPH   50 mg at 07/30/22 1457   hydrALAZINE (APRESOLINE) injection 5 mg  5 mg Intravenous Q4H PRN Annita Brod, MD   5 mg at 07/31/22 0418   insulin aspart (novoLOG) injection 0-15 Units  0-15 Units Subcutaneous TID WC Jose Persia, MD   3 Units at 07/31/22 0750   ondansetron (ZOFRAN) tablet 4 mg  4 mg Oral Q6H PRN Jose Persia, MD       Or   ondansetron (ZOFRAN) injection 4 mg  4 mg Intravenous Q6H PRN Jose Persia, MD   4 mg at 07/31/22 0419   pantoprazole (PROTONIX) EC tablet 40 mg  40 mg Oral BID Annita Brod, MD   40 mg at 07/30/22 2042  polyethylene glycol (MIRALAX / GLYCOLAX) packet 17 g  17 g Oral Daily PRN Jose Persia, MD       promethazine (PHENERGAN) 12.5 mg in sodium chloride 0.9 % 50 mL IVPB  12.5 mg Intravenous Q6H PRN Annita Brod, MD   Stopped at 07/28/22 1511   sodium chloride flush (NS) 0.9 % injection 3 mL  3 mL Intravenous Q12H Jose Persia, MD   3 mL at 07/30/22 2042   Objective: Vital signs in last 24 hours: Temp:  [98.2 F (36.8 C)-100.9 F (38.3 C)] 98.8 F (37.1 C) (11/06 0727) Pulse Rate:  [64-81] 81 (11/06 0727) Resp:  [16-18] 16 (11/06 0727) BP: (138-157)/(74-86) 138/78 (11/06 0727) SpO2:  [97 %-100 %] 98 % (11/06 0727)  Intake/Output from previous day: 11/05 0701 - 11/06 0700 In: 739.3 [I.V.:739.3] Out: 500 [Urine:500] Intake/Output this shift: No intake/output data recorded.  Physical Exam Vitals and nursing note reviewed.  Constitutional:      General: He is not in acute distress.     Appearance: He is not ill-appearing, toxic-appearing or diaphoretic.  HENT:     Head: Normocephalic and atraumatic.  Pulmonary:     Effort: Pulmonary effort is normal. No respiratory distress.  Skin:    General: Skin is moist.  Neurological:     Mental Status: He is alert and oriented to person, place, and time.  Psychiatric:        Mood and Affect: Mood normal.        Behavior: Behavior normal.    Lab Results:  Recent Labs    07/30/22 0400 07/31/22 0444  WBC 9.4 11.7*  HGB 12.2* 11.3*  HCT 37.8* 34.4*  PLT 266 248   BMET Recent Labs    07/30/22 0400 07/31/22 0444  NA 146* 141  K 3.7 3.6  CL 119* 110  CO2 18* 22  GLUCOSE 142* 153*  BUN 18 12  CREATININE 1.31* 1.30*  CALCIUM 9.4 9.0   Assessment & Plan: 71 year old make with left hemiparesis following CVA on aspirin and Plavix currently admitted with SBO with incidental finding of a chronic moderate-severe left hydroureteronephrosis to the level of the 34m left lower proximal ureteral stone and left renal parenchymal atrophy with evidence of urinary obstruction dating back to July 2021.  He originally was offered left ureteral stent placement, but elected to defer this pending outpatient Lasix renogram. He was subsequently added on for stent last week, but this was canceled due to aspiration concerns from anesthesia in the setting of his SBO. Plan per Dr. SBernardo Heateras of late last week was to proceed with stenting this week or outpatient pending his clinical course.  Unclear if his fever overnight was urologic in origin. He remains asymptomatic of his left hydroureteronephrosis and renal function is stable. He is now afebrile, VSS.  Will await results of UA today; if his urine appears grossly infected may proceed with left ureteral stent placement this afternoon vs tomorrow. Please keep him NPO pending UA results and further planning.  SDebroah Loop PA-C 07/31/2022

## 2022-07-31 NOTE — Consult Note (Signed)
Subjective:   CC: SBO  HPI:  Rodney Estrada is a 71 y.o. male who was consulted by [facility] for issue above.  Symptoms were first noted several days ago. Supposedly advanced to clears but then started having distention again.  NG placed and has been clamped, subsequent xray as noted below, so surgery re-consulted.  Pt currently has no complaints.     Past Medical History:  has a past medical history of Basal cell carcinoma, Chronic gingivitis, Constipation, Coronary artery aneurysm, Functional dyspepsia, Gout, Hemiparesis (HCC), Hemiplegia ([place]), Hemorrhoids, Hypertension, Major depressive disorder, Muscle weakness, and Seborrhea capitis.  Past Surgical History:  Past Surgical History:  Procedure Laterality Date   IR CT HEAD LTD  04/05/2020   IR INTRA CRAN STENT  04/05/2020   IR PERCUTANEOUS ART THROMBECTOMY/INFUSION INTRACRANIAL INC DIAG ANGIO  04/05/2020       IR PERCUTANEOUS ART THROMBECTOMY/INFUSION INTRACRANIAL INC DIAG ANGIO  04/05/2020   RADIOLOGY WITH ANESTHESIA N/A 04/05/2020   Procedure: IR WITH ANESTHESIA;  Surgeon: Radiologist, Medication, MD;  Location: [facility];  Service: Radiology;  Laterality: N/A;    Family History: family history includes Hypertension in his father and mother.  Social History:  reports that he has been smoking. He has never used smokeless tobacco. He reports current alcohol use of about 10.0 standard drinks of alcohol per week. He reports that he does not currently use drugs.  Current Medications:  Prior to Admission medications   Medication Sig Start Date End Date Taking? Authorizing Provider  amLODipine (NORVASC) 5 MG tablet Take 2.5 mg by mouth daily. Hold for pulse less than 60   Yes [provider]  allopurinol (ZYLOPRIM) 100 MG tablet Take 100 mg by mouth daily. 07/12/22   [provider]  amLODipine (NORVASC) 10 MG tablet Take 10 mg by mouth daily. 07/10/22   [provider]  ANTI-ITCH lotion Apply topically. 04/04/22   [provider]  ARIPiprazole (ABILIFY) 2 MG tablet Take 2 mg by mouth daily. 07/09/22   [provider]  aspirin 81 MG chewable tablet Chew 1 tablet (81 mg total) by mouth daily. 04/16/20   Donzetta Starch, NP  atorvastatin (LIPITOR) 80 MG tablet Take 1 tablet (80 mg total) by mouth daily. Patient taking differently: Take 40 mg by mouth daily. 04/16/20   Donzetta Starch, NP  carboxymethylcellulose (REFRESH PLUS) 0.5 % SOLN Apply to eye. 07/15/22   [provider]  cephALEXin (KEFLEX) 500 MG capsule Take 500 mg by mouth 2 (two) times daily. Patient not taking: Reported on 07/26/2022 05/05/22   [provider]  CHLORASEPTIC SORE THROAT 6-10 MG lozenge  06/07/22   [provider]  cloNIDine (CATAPRES) 0.1 MG tablet Take 0.1 mg by mouth 3 (three) times daily. Patient not taking: Reported on 07/26/2022 03/19/22   [provider]  clopidogrel (PLAVIX) 75 MG tablet Take 1 tablet (75 mg total) by mouth daily. 04/19/20   Donzetta Starch, NP  diphenhydrAMINE (BENADRYL) 25 mg capsule Take 1 capsule (25 mg total) by mouth every 6 (six) hours as needed for itching. 04/16/20   Donzetta Starch, NP  furosemide (LASIX) 20 MG tablet Take 20 mg by mouth daily. 07/07/22   [provider]  gabapentin (NEURONTIN) 300 MG capsule Take 300 mg by mouth 3 (three) times daily. 07/22/22   [provider]  HIBICLENS 4 % external liquid Apply topically. 06/22/22   [provider]  insulin aspart (NOVOLOG) 100 UNIT/ML injection Inject 0-15 Units into  the skin every 4 (four) hours. Patient not taking: Reported on 07/26/2022 04/16/20   Donzetta Starch, NP  insulin detemir (LEVEMIR) 100 UNIT/ML injection Inject 0.08 mLs (8 Units total) into the skin 2 (two) times daily. Patient not taking: Reported on 07/26/2022 04/16/20   Donzetta Starch, NP  ipratropium (ATROVENT) 0.02 % nebulizer solution Take by nebulization. Patient not taking: Reported on 07/26/2022 03/27/22   [provider]  labetalol (NORMODYNE) 100 MG tablet Take 1 tablet (100 mg total) by mouth 3 (three) times daily. 04/19/20   Donzetta Starch, NP  labetalol (NORMODYNE) 300 MG tablet Take by mouth daily. Patient not taking: Reported on 07/26/2022 03/20/22   [provider]  LANTUS SOLOSTAR 100 UNIT/ML Solostar Pen Inject 3 mLs into the skin daily. 07/04/22   [provider]  lisinopril (ZESTRIL) 20 MG tablet Take 1 tablet (20 mg total) by mouth daily. Patient not taking: Reported on 07/26/2022 04/16/20   Donzetta Starch, NP  lisinopril (ZESTRIL) 40 MG tablet Take 40 mg by mouth daily. 06/26/22   [provider]  LORazepam (ATIVAN) 2 MG/ML injection  06/20/22   [provider]  Maltodextrin-Xanthan Gum (RESOURCE THICKENUP CLEAR) POWD Take 120 g by mouth as needed (honey thick liquids). 04/16/20   Donzetta Starch, NP  nicotine (NICODERM CQ - DOSED IN MG/24 HOURS) 14 mg/24hr patch Place 1 patch (14 mg total) onto the skin daily. Patient not taking: Reported on 07/26/2022 04/16/20   Donzetta Starch, NP  ondansetron (ZOFRAN-ODT) 4 MG disintegrating tablet Take 4 mg by mouth every 6 (six) hours as needed. Patient not taking: Reported on 07/26/2022 06/03/22   [provider]  pantoprazole (PROTONIX) 40 MG tablet Take 40 mg by mouth daily. 07/08/22   [provider]  sertraline (ZOLOFT) 50 MG tablet Take 150 mg by mouth daily. 06/27/22   [provider]  SYSTANE BALANCE 0.6 % SOLN Apply to eye. 07/15/22   [provider]  Vitamin D, Ergocalciferol, (DRISDOL) 1.25 MG (50000 UNIT) CAPS capsule Take 50,000 Units by mouth every 7 (seven) days. Mondays 07/11/22   [provider]    Allergies:  Allergies as of 07/22/2022   (No Known Allergies)    ROS:  General: Denies weight loss, weight gain, fatigue, fevers, chills, and night sweats. Eyes: Denies blurry vision, double vision, eye pain, itchy eyes, and tearing. Ears: Denies hearing loss,  earache, and ringing in ears. Nose: Denies sinus pain, congestion, infections, runny nose, and nosebleeds. Mouth/throat: Denies hoarseness, sore throat, bleeding gums, and difficulty swallowing. Heart: Denies chest pain, palpitations, racing heart, irregular heartbeat, leg pain or swelling, and decreased activity tolerance. Respiratory: Denies breathing difficulty, shortness of breath, wheezing, cough, and sputum. GI: Denies change in appetite, heartburn, nausea, vomiting, constipation, diarrhea, and blood in stool. GU: Denies difficulty urinating, pain with urinating, urgency, frequency, blood in urine. Musculoskeletal: Denies joint stiffness, pain, swelling, muscle weakness. Skin: Denies rash, itching, mass, tumors, sores, and boils Neurologic: Denies headache, fainting, dizziness, seizures, numbness, and tingling. Psychiatric: Denies depression, anxiety, difficulty sleeping, and memory loss. Endocrine: Denies heat or cold intolerance, and increased thirst or urination. Blood/lymph: Denies easy bruising, easy bruising, and swollen glands     Objective:     BP 138/78 (BP Location: Right Arm)   Pulse 81   Temp 98.8 F (37.1 C) (Oral)   Resp 16   Ht '5\' 9"'$  (1.753 m)   Wt 102.1 kg   SpO2 98%   BMI  33.23 kg/m   Constitutional :  alert, cooperative, appears stated age, and no distress  Lymphatics/Throat:  no asymmetry, masses, or scars  Respiratory:  clear to auscultation bilaterally  Cardiovascular:  regular rate and rhythm  Gastrointestinal: Soft, non-tender, very minor distention? .   Musculoskeletal: Steady movement  Skin: Cool and moist, no surgical scars   Psychiatric: Normal affect, non-agitated, not confused       LABS:     Latest Ref Rng & Units 07/31/2022    4:44 AM 07/30/2022    4:00 AM 07/29/2022    4:32 AM  CMP  Glucose 70 - 99 mg/dL 153  142  96   BUN 8 - 23 mg/dL '12  18  18   '$ Creatinine 0.61 - 1.24 mg/dL 1.30  1.31  1.33   Sodium 135 - 145 mmol/L 141  146  143    Potassium 3.5 - 5.1 mmol/L 3.6  3.7  4.0   Chloride 98 - 111 mmol/L 110  119  117   CO2 22 - 32 mmol/L '22  18  20   '$ Calcium 8.9 - 10.3 mg/dL 9.0  9.4  9.1       Latest Ref Rng & Units 07/31/2022    4:44 AM 07/30/2022    4:00 AM 07/29/2022    4:32 AM  CBC  WBC 4.0 - 10.5 K/uL 11.7  9.4  9.5   Hemoglobin 13.0 - 17.0 g/dL 11.3  12.2  11.5   Hematocrit 39.0 - 52.0 % 34.4  37.8  36.0   Platelets 150 - 400 K/uL 248  266  220     RADS: Narrative & Impression  CLINICAL DATA:  Small bowel obstruction.   EXAM: ABDOMEN - 1 VIEW   COMPARISON:  July 29, 2022   FINDINGS: Nasogastric tube tip is seen in expected position proximal stomach. Stable small bowel dilatation is noted concerning for distal small bowel obstruction. No colonic dilatation is noted.   IMPRESSION: Stable small bowel dilatation is noted concerning for distal small bowel obstruction.     Electronically Signed   By: Marijo Conception M.D.   On: 07/31/2022 08:46   Assessment:   Dilated small bowel on most recent xray in patient with no symptoms, recorded BM with clamped NG for some time.  Plan:   Pt continues to have BM and is asymptomatic.  abdomen looks slightly distended but looks comfortable.  agree there is some minor bowel dilation on xray.  doubt there is any persistent issues, but will proceed with small bowel follow through study to confirm no obstruction, since report received was patient did not "tolerate" clamp trial, and this is somewhat persistent issue.    labs/images/medications/previous chart entries reviewed personally and relevant changes/updates noted above.

## 2022-07-31 NOTE — Care Management Important Message (Signed)
Important Message  Patient Details  Name: Rodney Estrada MRN: [id number] Date of Birth: 07/04/1951   Medicare Important Message Given:  Yes     Dannette Barbara 07/31/2022, 12:49 PM

## 2022-08-01 ENCOUNTER — Inpatient Hospital Stay: Payer: Medicare Other

## 2022-08-01 ENCOUNTER — Inpatient Hospital Stay: Payer: Medicare Other | Admitting: Radiology

## 2022-08-01 DIAGNOSIS — K56609 Unspecified intestinal obstruction, unspecified as to partial versus complete obstruction: Secondary | ICD-10-CM | POA: Diagnosis not present

## 2022-08-01 DIAGNOSIS — N261 Atrophy of kidney (terminal): Secondary | ICD-10-CM | POA: Diagnosis not present

## 2022-08-01 DIAGNOSIS — N179 Acute kidney failure, unspecified: Secondary | ICD-10-CM | POA: Diagnosis not present

## 2022-08-01 DIAGNOSIS — N200 Calculus of kidney: Secondary | ICD-10-CM | POA: Diagnosis not present

## 2022-08-01 HISTORY — PX: IR FLUORO GUIDE CV LINE RIGHT: IMG2283

## 2022-08-01 HISTORY — PX: IR US GUIDE VASC ACCESS RIGHT: IMG2390

## 2022-08-01 LAB — GLUCOSE, CAPILLARY
Glucose-Capillary: 118 mg/dL — ABNORMAL HIGH (ref 70–99)
Glucose-Capillary: 153 mg/dL — ABNORMAL HIGH (ref 70–99)
Glucose-Capillary: 155 mg/dL — ABNORMAL HIGH (ref 70–99)
Glucose-Capillary: 162 mg/dL — ABNORMAL HIGH (ref 70–99)
Glucose-Capillary: 177 mg/dL — ABNORMAL HIGH (ref 70–99)

## 2022-08-01 LAB — PHOSPHORUS: Phosphorus: 3.1 mg/dL (ref 2.5–4.6)

## 2022-08-01 LAB — BASIC METABOLIC PANEL
Anion gap: 10 (ref 5–15)
BUN: 15 mg/dL (ref 8–23)
CO2: 26 mmol/L (ref 22–32)
Calcium: 9 mg/dL (ref 8.9–10.3)
Chloride: 102 mmol/L (ref 98–111)
Creatinine, Ser: 1.87 mg/dL — ABNORMAL HIGH (ref 0.61–1.24)
GFR, Estimated: 38 mL/min — ABNORMAL LOW (ref >60)
Glucose, Bld: 169 mg/dL — ABNORMAL HIGH (ref 70–99)
Potassium: 3.4 mmol/L — ABNORMAL LOW (ref 3.5–5.1)
Sodium: 138 mmol/L (ref 135–145)

## 2022-08-01 LAB — CBC
HCT: 36.8 % — ABNORMAL LOW (ref 39.0–52.0)
Hemoglobin: 12.1 g/dL — ABNORMAL LOW (ref 13.0–17.0)
MCH: 27 pg (ref 26.0–34.0)
MCHC: 32.9 g/dL (ref 30.0–36.0)
MCV: 82.1 fL (ref 80.0–100.0)
Platelets: 264 10*3/uL (ref 150–400)
RBC: 4.48 MIL/uL (ref 4.22–5.81)
RDW: 15.2 % (ref 11.5–15.5)
WBC: 16.6 10*3/uL — ABNORMAL HIGH (ref 4.0–10.5)
nRBC: 0 % (ref 0.0–0.2)

## 2022-08-01 LAB — MAGNESIUM: Magnesium: 2.3 mg/dL (ref 1.7–2.4)

## 2022-08-01 MED ORDER — CHLORHEXIDINE GLUCONATE CLOTH 2 % EX PADS
6.0000 | MEDICATED_PAD | Freq: Every day | CUTANEOUS | Status: DC
  Administered 2022-08-02 – 2022-08-07 (×6): 6 via TOPICAL

## 2022-08-01 MED ORDER — HEPARIN SOD (PORK) LOCK FLUSH 100 UNIT/ML IV SOLN
INTRAVENOUS | Status: AC
  Filled 2022-08-01: qty 5

## 2022-08-01 MED ORDER — SODIUM CHLORIDE 0.9 % IV SOLN
2.0000 g | INTRAVENOUS | Status: DC
  Administered 2022-08-01 – 2022-08-03 (×3): 2 g via INTRAVENOUS
  Filled 2022-08-01: qty 20
  Filled 2022-08-01: qty 2
  Filled 2022-08-01 (×2): qty 20

## 2022-08-01 MED ORDER — LIDOCAINE HCL 1 % IJ SOLN
INTRAMUSCULAR | Status: AC
  Administered 2022-08-01: 6 mL
  Filled 2022-08-01: qty 20

## 2022-08-01 MED ORDER — POTASSIUM CHLORIDE 10 MEQ/100ML IV SOLN
10.0000 meq | Freq: Once | INTRAVENOUS | Status: AC
  Administered 2022-08-01: 10 meq via INTRAVENOUS
  Filled 2022-08-01: qty 100

## 2022-08-01 MED ORDER — TRAVASOL 10 % IV SOLN
INTRAVENOUS | Status: AC
  Filled 2022-08-01: qty 583.2

## 2022-08-01 MED ORDER — INSULIN ASPART 100 UNIT/ML IJ SOLN
0.0000 [IU] | Freq: Four times a day (QID) | INTRAMUSCULAR | Status: DC
  Administered 2022-08-01 – 2022-08-03 (×5): 3 [IU] via SUBCUTANEOUS
  Administered 2022-08-03: 5 [IU] via SUBCUTANEOUS
  Administered 2022-08-03: 3 [IU] via SUBCUTANEOUS
  Administered 2022-08-03 (×2): 5 [IU] via SUBCUTANEOUS
  Administered 2022-08-04: 3 [IU] via SUBCUTANEOUS
  Filled 2022-08-01 (×10): qty 1

## 2022-08-01 NOTE — Procedures (Signed)
Interventional Radiology Procedure Note  Procedure: Non-tunneled central line placement  Complications: None  Estimated Blood Loss: < 10 mL  Findings: 7 Fr triple lumen CVC placed via right IJ vein. Tip at SVC/RA junction. OK to use.  Venetia Night. Kathlene Cote, M.D Pager:  [phone]

## 2022-08-01 NOTE — Progress Notes (Addendum)
PHARMACY - TOTAL PARENTERAL NUTRITION CONSULT NOTE   Indication: Small bowel obstruction  Patient Measurements: Height: '5\' 9"'$  (175.3 cm) Weight: 93.9 kg (207 lb) IBW/kg (Calculated) : 70.7 TPN AdjBW (KG): 78.5 Body mass index is 30.57 kg/m.  Assessment:  71 y.o. male with medical history significant of prior CVA with residual left hemiparesis, hypertension, CAD, depression who presented to the ED with complaints of brown emesis, SBO.   Glucose / Insulin: BG 114 - 169 previous 24h (5 units SSI required) Electrolytes: hypokalemic Renal:  SCr 2.12-->1.39-->1.87 (unknown BL) Hepatic: AST/ALT wnl Intake / Output; MIVF: dextrose 5 % 1,000 mL with potassium chloride 20 mEq, sodium bicarbonate 75 mEq infusion  at 75 mLhr GI Imaging:  11/06: Persistent gaseous SBO on Abd x-ray GI Surgeries / Procedures: no recent  Central access: 08/01/22 (pending placement) TPN start date: 08/01/22  Nutritional Goals: Goal TPN rate is 90 mL/hr (provides 116 g of protein and 2384 kcals per day)  RD Assessment: pending    Current Nutrition: CLD  Plan:  --Start TPN at 49m/hr at 1800 --Nutritional components: Amino acids: 58 grams Dextrose 176 grams Lipids: 36 grams kCal 1191.8/24h --Electrolytes in TPN (standard): Na 558m/L, K 5029mL, Ca 5mE57m, Mg 5mEq75m and Phos 15mmo66m Cl:Ac 1:1 --Add standard MVI, thiamine '100mg'$  x 3 and trace elements to TPN --adjust Sensitive to q6h SSI and adjust as needed  Reduce MIVF to 30 mL/hr at 1800 Monitor TPN labs on Mon/Thurs, daily until stable  RodneyDallie Piles2023,11:48 AM

## 2022-08-01 NOTE — H&P (View-Only) (Signed)
Urology Inpatient Progress Note  Subjective: No acute events overnight. He developed another fever overnight, Tmax 38C. He is afebrile, VSS this morning. His UA yesterday appears grossly infected with nitrites, >59 WBCs/hpf, >50 RBCs/hpf, and rare bacteria.  Notably, his urine sample was collected from his condom catheter and there was some difficulty collecting a sample.  On antibiotics as below. Creatinine up today, 1.87. WBC count up, 16.6. He reports some right arm pain but denies flank pain.  Anti-infectives: Anti-infectives (From admission, onward)    Start     Dose/Rate Route Frequency Ordered Stop   07/31/22 1430  cefTRIAXone (ROCEPHIN) 1 g in sodium chloride 0.9 % 100 mL IVPB        1 g 200 mL/hr over 30 Minutes Intravenous Every 24 hours 07/31/22 1301     07/27/22 0700  cefTRIAXone (ROCEPHIN) 1 g in sodium chloride 0.9 % 100 mL IVPB  Status:  Discontinued        1 g 200 mL/hr over 30 Minutes Intravenous  Once 07/26/22 1610 07/28/22 1007   07/26/22 1100  cefTRIAXone (ROCEPHIN) 1 g in sodium chloride 0.9 % 100 mL IVPB  Status:  Discontinued       Note to Pharmacy: Call   1 g 200 mL/hr over 30 Minutes Intravenous Every 24 hours 07/25/22 1403 07/26/22 1610   07/25/22 1200  cefTRIAXone (ROCEPHIN) 1 g in sodium chloride 0.9 % 100 mL IVPB       Note to Pharmacy: Call   1 g 200 mL/hr over 30 Minutes Intravenous  Once 07/24/22 1609 07/26/22 1208       Current Facility-Administered Medications  Medication Dose Route Frequency Provider Last Rate Last Admin   acetaminophen (TYLENOL) tablet 650 mg  650 mg Oral Q6H PRN Jose Persia, MD   650 mg at 07/31/22 0418   Or   acetaminophen (TYLENOL) suppository 650 mg  650 mg Rectal Q6H PRN Jose Persia, MD       alum & mag hydroxide-simeth (MAALOX/MYLANTA) 200-200-20 MG/5ML suspension 30 mL  30 mL Oral Q6H PRN Annita Brod, MD   30 mL at 07/31/22 0419   aspirin chewable tablet 81 mg  81 mg Oral Daily Jose Persia, MD   81 mg  at 07/30/22 0819   atorvastatin (LIPITOR) tablet 40 mg  40 mg Oral QPM Jose Persia, MD   40 mg at 07/30/22 1717   cefTRIAXone (ROCEPHIN) 1 g in sodium chloride 0.9 % 100 mL IVPB  1 g Intravenous Q24H Sharen Hones, MD   Stopped at 07/31/22 1606   chlorproMAZINE (THORAZINE) injection 25 mg  25 mg Intramuscular TID PRN Annita Brod, MD   25 mg at 07/28/22 2036   clopidogrel (PLAVIX) tablet 75 mg  75 mg Oral Daily Jose Persia, MD   75 mg at 07/30/22 0819   dextrose 5 % 1,000 mL with potassium chloride 20 mEq, sodium bicarbonate 75 mEq infusion   Intravenous Continuous Lorna Dibble, RPH 75 mL/hr at 08/01/22 0848 Restarted at 08/01/22 0848   enoxaparin (LOVENOX) injection 50 mg  0.5 mg/kg Subcutaneous Q24H Alison Murray, RPH   50 mg at 07/31/22 1528   hydrALAZINE (APRESOLINE) injection 5 mg  5 mg Intravenous Q4H PRN Annita Brod, MD   5 mg at 07/31/22 0418   insulin aspart (novoLOG) injection 0-15 Units  0-15 Units Subcutaneous TID WC Jose Persia, MD   3 Units at 08/01/22 0827   ondansetron (ZOFRAN) tablet 4 mg  4 mg Oral  Q6H PRN Jose Persia, MD       Or   ondansetron [facility]) injection 4 mg  4 mg Intravenous Q6H PRN Jose Persia, MD   4 mg at 07/31/22 0419   pantoprazole (PROTONIX) EC tablet 40 mg  40 mg Oral BID Annita Brod, MD   40 mg at 07/30/22 2042   polyethylene glycol (MIRALAX / GLYCOLAX) packet 17 g  17 g Oral Daily PRN Jose Persia, MD       potassium chloride 10 mEq in 100 mL IVPB  10 mEq Intravenous Once Sharen Hones, MD 100 mL/hr at 08/01/22 0831 10 mEq at 08/01/22 0831   promethazine (PHENERGAN) 12.5 mg in sodium chloride 0.9 % 50 mL IVPB  12.5 mg Intravenous Q6H PRN Annita Brod, MD   Stopped at 07/28/22 1511   sodium chloride flush (NS) 0.9 % injection 3 mL  3 mL Intravenous Q12H Jose Persia, MD   3 mL at 07/31/22 2113   Objective: Vital signs in last 24 hours: Temp:  [97.9 F (36.6 C)-100.4 F (38 C)] 98.4 F (36.9 C) (11/07  0743) Pulse Rate:  [89-110] 89 (11/07 0743) Resp:  [16-18] 16 (11/07 0743) BP: (113-163)/(74-81) 123/76 (11/07 0743) SpO2:  [94 %-96 %] 94 % (11/07 0743)  Intake/Output from previous day: 11/06 0701 - 11/07 0700 In: 150 [IV Piggyback:150] Out: 1000 [Urine:400; Emesis/NG output:600] Intake/Output this shift: No intake/output data recorded.  Physical Exam Vitals and nursing note reviewed.  Constitutional:      General: He is not in acute distress.    Appearance: He is not ill-appearing, toxic-appearing or diaphoretic.  HENT:     Head: Normocephalic and atraumatic.  Pulmonary:     Effort: Pulmonary effort is normal. No respiratory distress.  Skin:    General: Skin is warm and dry.  Neurological:     Mental Status: He is alert.  Psychiatric:        Mood and Affect: Mood normal.        Behavior: Behavior normal.    Lab Results:  Recent Labs    07/31/22 0444 08/01/22 0634  WBC 11.7* 16.6*  HGB 11.3* 12.1*  HCT 34.4* 36.8*  PLT 248 264   BMET Recent Labs    07/31/22 0444 08/01/22 0634  NA 141 138  K 3.6 3.4*  CL 110 102  CO2 22 26  GLUCOSE 153* 169*  BUN 12 15  CREATININE 1.30* 1.87*  CALCIUM 9.0 9.0   Studies/Results: DG Abd Portable 1V-Small Bowel Obstruction Protocol-initial, 8 hr delay  Result Date: 07/31/2022 CLINICAL DATA:  Small bowel obstruction, 8 hour delayed film. EXAM: PORTABLE ABDOMEN - 1 VIEW COMPARISON:  Radiograph earlier today. FINDINGS: Enteric tube is in place. There is enteric contrast within dilated small bowel in the central abdomen. Persistent gaseous small bowel distension. There is no contrast in the colon. IMPRESSION: Enteric contrast within dilated small bowel in the central abdomen. Persistent gaseous small bowel distension. No contrast in the colon. Electronically Signed   By: Keith Rake M.D.   On: 07/31/2022 21:28   DG Chest 2 View  Result Date: 07/31/2022 CLINICAL DATA:  Pneumonia. EXAM: CHEST - 2 VIEW COMPARISON:  July 23, 2022. FINDINGS: The heart size and mediastinal contours are within normal limits. Both lungs are clear. Distal tip of nasogastric tube is seen in expected position of distal esophagus. The visualized skeletal structures are unremarkable. IMPRESSION: No acute cardiopulmonary abnormality seen. Distal tip of nasogastric tube is seen in expected position  of distal esophagus. Electronically Signed   By: Marijo Conception M.D.   On: 07/31/2022 08:48   DG Abd 1 View  Result Date: 07/31/2022 CLINICAL DATA:  Small bowel obstruction. EXAM: ABDOMEN - 1 VIEW COMPARISON:  July 29, 2022 FINDINGS: Nasogastric tube tip is seen in expected position proximal stomach. Stable small bowel dilatation is noted concerning for distal small bowel obstruction. No colonic dilatation is noted. IMPRESSION: Stable small bowel dilatation is noted concerning for distal small bowel obstruction. Electronically Signed   By: Marijo Conception M.D.   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  His UA today appears grossly infected, however I am concerned for sample contamination given difficulty obtaining the sample.  He developed another fever overnight, but is afebrile this morning.  He denies any acute flank pain.  At this point, I have low concern for urosepsis associated with his chronic obstructing left ureteral stone.  No plans for urgent stenting today, he may resume a diet.  May consider adding him on with Dr. Bernardo Heater for nonurgent left ureteral stent placement later this week.  Debroah Loop, PA-C 08/01/2022

## 2022-08-01 NOTE — Progress Notes (Signed)
Initial Nutrition Assessment  DOCUMENTATION CODES:   Not applicable  INTERVENTION:   TPN per pharmacy   Recommend thiamine 151m daily in TPN X 3 days   Pt at high refeed risk; recommend monitor potassium, magnesium and phosphorus labs daily until stable  Daily weights   NUTRITION DIAGNOSIS:   Inadequate oral intake related to acute illness (SBO) as evidenced by other (comment) (pt on NPO/clear liquid diet).  GOAL:   Patient will meet greater than or equal to 90% of their needs -not met   MONITOR:   PO intake, Diet advancement, Labs, Weight trends, Skin, I & O's, Other (Comment) (TPN)  REASON FOR ASSESSMENT:   Consult New TPN/TNA  ASSESSMENT:   71y/o male with h/o R MCA infarct with L hemiplegia, HTN, DM, L nephrolithiasis with chronic hydroureteronephrosis and renal parenchymal atrophy with evidence of urinary obstruction dating back to July 2021, HLD, etoh abuse, substance abuse and MDD who is admitted with SBO.  Pt s/p IR NGT placement 11/3 Pt s/p IR CVC placement today  Met with pt in room late on 11/6. Pt reported at that time abdominal cramping and distension. Pt burping and hiccupping at time of RD visit. Pt complaining of thirst and asking for water. Pt s/p SBFT yesterday which showed continued SBO. Pt is passing some flatus and liquid stools. NGT in place with 6036moutput overnight. Surgery following; plan is for medical management for now. Pt initiated on clear liquid diet today. Pt has mainly been on NPO/liquid diet since admission. Pt was briefly on a soft diet but developed nausea and vomiting and was made NPO again on 11/1. Will plan to initiate TPN today as pt has been without adequate nutrition for > 7 days. Recommend continue TPN until patient is able to tolerate a regular diet. Pt is at high refeed risk.   Per chart, pt is down ~10lbs(5%) since January; RD unsure how recently weight loss occurred. Pt reports his UBW is around 225-230lbs; pt reports that  he has lost ~20lbs over the past several months.   Medications reviewed and include: aspirin, plavix, lovenox, insulin, protonix, ceftriaxone  Labs reviewed: K 3.4(L), creat 1.87(H), P 3.1 wnl, Mg 2.3 wnl Wbc- 16.6(H) Cbgs- 162, 177 x 24 hrs  AIC 6.2(H)- 10/29   NUTRITION - FOCUSED PHYSICAL EXAM:  Flowsheet Row Most Recent Value  Orbital Region No depletion  Upper Arm Region Moderate depletion  Thoracic and Lumbar Region No depletion  Buccal Region No depletion  Temple Region No depletion  Clavicle Bone Region Moderate depletion  Clavicle and Acromion Bone Region Moderate depletion  Scapular Bone Region No depletion  Dorsal Hand Moderate depletion  Patellar Region Mild depletion  Anterior Thigh Region Mild depletion  Posterior Calf Region Mild depletion  Edema (RD Assessment) None  Hair Reviewed  Eyes Reviewed  Mouth Reviewed  Skin Reviewed  Nails Reviewed   Diet Order:   Diet Order             Diet clear liquid Room service appropriate? Yes; Fluid consistency: Thin  Diet effective now                  EDUCATION NEEDS:   Education needs have been addressed  Skin:  Skin Assessment: Reviewed RN Assessment  Last BM:  11/7- TYPE 7  Height:   Ht Readings from Last 1 Encounters:  07/23/22 5' 9" (1.753 m)    Weight:   Wt Readings from Last 1 Encounters:  08/01/22 93.9 kg  Ideal Body Weight:  72.7 kg  BMI:  Body mass index is 30.57 kg/m.  Estimated Nutritional Needs:   Kcal:  2100-2400kcal/day  Protein:  105-120g/day  Fluid:  1.9-2.2L/day  Koleen Distance MS, RD, LDN Please refer to [facility] for RD and/or RD on-call/weekend/after hours pager

## 2022-08-01 NOTE — Progress Notes (Signed)
Subjective:  CC: Rodney Estrada is a 71 y.o. male  Hospital stay day 7, sbo  HPI: No acute issues overnight  ROS:  General: Denies weight loss, weight gain, fatigue, fevers, chills, and night sweats. Heart: Denies chest pain, palpitations, racing heart, irregular heartbeat, leg pain or swelling, and decreased activity tolerance. Respiratory: Denies breathing difficulty, shortness of breath, wheezing, cough, and sputum. GI: Denies change in appetite, heartburn, nausea, vomiting, constipation, diarrhea, and blood in stool. GU: Denies difficulty urinating, pain with urinating, urgency, frequency, blood in urine.   Objective:   Temp:  [97.9 F (36.6 C)-100.4 F (38 C)] 98.4 F (36.9 C) (11/07 0743) Pulse Rate:  [89-110] 89 (11/07 0743) Resp:  [16-18] 16 (11/07 0743) BP: (113-163)/(74-81) 123/76 (11/07 0743) SpO2:  [94 %-96 %] 94 % (11/07 0743) Weight:  [93.9 kg] 93.9 kg (11/07 0937)     Height: '5\' 9"'$  (175.3 cm) Weight: 93.9 kg BMI (Calculated): 30.55   Intake/Output this shift:   Intake/Output Summary (Last 24 hours) at 08/01/2022 1237 Last data filed at 08/01/2022 0444 Gross per 24 hour  Intake 150 ml  Output 1000 ml  Net -850 ml    Constitutional :  alert, cooperative, appears stated age, and no distress  Respiratory:  clear to auscultation bilaterally  Cardiovascular:  regular rate and rhythm  Gastrointestinal: Soft, no guarding, some distention? .   Skin: Cool and moist.   Psychiatric: Normal affect, non-agitated, not confused       LABS:     Latest Ref Rng & Units 08/01/2022    6:34 AM 07/31/2022    4:44 AM 07/30/2022    4:00 AM  CMP  Glucose 70 - 99 mg/dL 169  153  142   BUN 8 - 23 mg/dL '15  12  18   '$ Creatinine 0.61 - 1.24 mg/dL 1.87  1.30  1.31   Sodium 135 - 145 mmol/L 138  141  146   Potassium 3.5 - 5.1 mmol/L 3.4  3.6  3.7   Chloride 98 - 111 mmol/L 102  110  119   CO2 22 - 32 mmol/L '26  22  18   '$ Calcium 8.9 - 10.3 mg/dL 9.0  9.0  9.4       Latest Ref Rng  & Units 08/01/2022    6:34 AM 07/31/2022    4:44 AM 07/30/2022    4:00 AM  CBC  WBC 4.0 - 10.5 K/uL 16.6  11.7  9.4   Hemoglobin 13.0 - 17.0 g/dL 12.1  11.3  12.2   Hematocrit 39.0 - 52.0 % 36.8  34.4  37.8   Platelets 150 - 400 K/uL 264  248  266     RADS: CLINICAL DATA:  Small bowel obstruction, 8 hour delayed film.   EXAM: PORTABLE ABDOMEN - 1 VIEW   COMPARISON:  Radiograph earlier today.   FINDINGS: Enteric tube is in place. There is enteric contrast within dilated small bowel in the central abdomen. Persistent gaseous small bowel distension. There is no contrast in the colon.   IMPRESSION: Enteric contrast within dilated small bowel in the central abdomen. Persistent gaseous small bowel distension. No contrast in the colon.     Electronically Signed   By: Keith Rake M.D.   On: 07/31/2022 21:28 Assessment:   SBO, imaging as noted above but patient continues to have BM.  Will resume clears, repeat study at 24hr mark. If contrast extends into colon at that point, ok to remove NG.    labs/images/medications/previous  chart entries reviewed personally and relevant changes/updates noted above.

## 2022-08-01 NOTE — Progress Notes (Signed)
Progress Note   Patient: Rodney Estrada LKG:[id number] DOB: 1951-08-08 DOA: 07/23/2022     7 DOS: the patient was seen and examined on 08/01/2022   Brief hospital course: 71 year old male with past medical history of CVA with left-sided hemiparesis, hypertension and depression who presented to the emergency room on 10/29 with 1 episode of emesis.  Patient found to have partial versus early small bowel obstruction that look to be mild.  Patient also noted to have large calculus with moderate proximal left hydroureteronephrosis.  Patient seen by urology who felt findings were chronic.  Patient seen by general surgery who felt a small bowel obstruction could be managed conservatively.  By 10/30, obstruction felt to have resolved and patient started on clear liquids.  Diet advanced and by 10/31, tolerating soft bland food.  However, starting evening of 10/31, patient started having nausea and vomiting.  Repeat x-ray on 11/1 noted recurrence of small bowel obstruction.  With persistent obstruction, NG tube placed by interventional radiology 11/3.  Urology met with patient on 10/31 about placing stent.  Recommendation is to have it done sooner than later to preserve remaining renal function as long as it is more than 20%.  After some consideration, patient amenable, and urology plans to take patient to OR once SBO resolves.  Patient appears to have persistent small bowel obstruction on x-ray, but was able to have bowel movements.  General surgery is following.  Central line will be placed, TPN started. She is also had intermittent fever, started on Rocephin on 11/6, urology is still planning for surgery later this week.   Assessment and Plan:  SBO (small bowel obstruction) ([place]) Initially looks to have resolved, now recurrent. NG tube placed by interventional radiology on morning of 11/3.  General surgery to follow-up if no improvement by 11/4. Patient has been having bowel movements since the last time he  given lactulose.  Since then, patient has been having daily bowel movement.  However, repeated chest x-ray and the small bowel follow-through still show evidence of small bowel obstruction.  He was started on clear liquid diet.  However, he has been in the hospital for 9 days without significant nutrition, as result, we will place a central line and start TPN.    AKI (acute kidney injury) ([place]) in the setting of stage IIIa chronic kidney disease Hypokalemia. Mild metabolic acidosis. Hypernatremia. Likely multifactorial in the setting of poor p.o. intake after emesis yesterday.  There is also some component of the renal stone causing chronic hydronephrosis.   Fluids changed to D5 water with 75 mEq of sodium bicarb with added 20 mEq KCl.   Still mild hypokalemia, give additional potassium IV.  Slightly worsening renal function today.  Continue to follow.   Obstructive nephrolithiasis with hydronephrosis. New fever. Patient has a history of nephrolithiasis with CT imaging notable for large calculus with moderate proximal left hydroureteronephrosis.  Urology feels that this is chronic, but stent placement would benefit remaining renal function in that kidney.  To be done once SBO resolves I requested urology to see patient again yesterday, started on IV Rocephin.  I still believe the fever is from a urinary, I have requested urology to reconsider the decision for surgery, whether this can be done on an urgent basis.  Blood culture will be sent out.   Stroke (cerebrum) ([place]) - R MCA s/p TPA and mechanical thrombectomy w/ stent placement Resumed home aspirin, Plavix, statin.   Essential hypertension Continue as needed hydralazine.   Type 2  diabetes mellitus ([place]) Continue sliding scale insulin.       Obesity Meets criteria BMI greater than 30       Subjective:  Patient still has a low-grade fever last night.  Has some confusion.  No shortness of breath or cough.  Physical Exam: Vitals:    07/31/22 1942 08/01/22 0507 08/01/22 0743 08/01/22 0937  BP: (!) 143/78 113/81 123/76   Pulse: (!) 110 91 89   Resp: _0 Temp: (!) 100.4 F (38 C) 97.9 F (36.6 C) 98.4 F (36.9 C)   TempSrc: Oral Oral Oral   SpO2: 95% 95% 94%   Weight:    93.9 kg  Height:       General exam: Ill-appearing, drowsy. Respiratory system: Clear to auscultation. Respiratory effort normal. Cardiovascular system: S1 & S2 heard, RRR. No JVD, murmurs, rubs, gallops or clicks. No pedal edema. Gastrointestinal system: Abdomen is nondistended, soft and nontender. No organomegaly or masses felt. Normal bowel sounds heard. Central nervous system: Drowsy and orientated x2. No focal neurological deficits. Extremities: Symmetric 5 x 5 power. Skin: No rashes, lesions or ulcers Psychiatry: Affect is flat.  Data Reviewed:  Lab results reviewed.  Family Communication: Sister updated at bedside.  Disposition: Status is: Inpatient Remains inpatient appropriate because: Severity of disease, IV treatment.  Planned Discharge Destination:  TBD    Time spent: 50 minutes  Author: Sharen Hones, MD 08/01/2022 11:52 AM  For on call review [email or web address].

## 2022-08-01 NOTE — H&P (View-Only) (Signed)
Urology Inpatient Progress Note  Subjective: No acute events overnight. He developed another fever overnight, Tmax 38C. He is afebrile, VSS this morning. His UA yesterday appears grossly infected with nitrites, >59 WBCs/hpf, >50 RBCs/hpf, and rare bacteria.  Notably, his urine sample was collected from his condom catheter and there was some difficulty collecting a sample.  On antibiotics as below. Creatinine up today, 1.87. WBC count up, 16.6. He reports some right arm pain but denies flank pain.  Anti-infectives: Anti-infectives (From admission, onward)    Start     Dose/Rate Route Frequency Ordered Stop   07/31/22 1430  cefTRIAXone (ROCEPHIN) 1 g in sodium chloride 0.9 % 100 mL IVPB        1 g 200 mL/hr over 30 Minutes Intravenous Every 24 hours 07/31/22 1301     07/27/22 0700  cefTRIAXone (ROCEPHIN) 1 g in sodium chloride 0.9 % 100 mL IVPB  Status:  Discontinued        1 g 200 mL/hr over 30 Minutes Intravenous  Once 07/26/22 1610 07/28/22 1007   07/26/22 1100  cefTRIAXone (ROCEPHIN) 1 g in sodium chloride 0.9 % 100 mL IVPB  Status:  Discontinued       Note to Pharmacy: Call   1 g 200 mL/hr over 30 Minutes Intravenous Every 24 hours 07/25/22 1403 07/26/22 1610   07/25/22 1200  cefTRIAXone (ROCEPHIN) 1 g in sodium chloride 0.9 % 100 mL IVPB       Note to Pharmacy: Call   1 g 200 mL/hr over 30 Minutes Intravenous  Once 07/24/22 1609 07/26/22 1208       Current Facility-Administered Medications  Medication Dose Route Frequency Provider Last Rate Last Admin   acetaminophen (TYLENOL) tablet 650 mg  650 mg Oral Q6H PRN Jose Persia, MD   650 mg at 07/31/22 0418   Or   acetaminophen (TYLENOL) suppository 650 mg  650 mg Rectal Q6H PRN Jose Persia, MD       alum & mag hydroxide-simeth (MAALOX/MYLANTA) 200-200-20 MG/5ML suspension 30 mL  30 mL Oral Q6H PRN Annita Brod, MD   30 mL at 07/31/22 0419   aspirin chewable tablet 81 mg  81 mg Oral Daily Jose Persia, MD   81 mg  at 07/30/22 0819   atorvastatin (LIPITOR) tablet 40 mg  40 mg Oral QPM Jose Persia, MD   40 mg at 07/30/22 1717   cefTRIAXone (ROCEPHIN) 1 g in sodium chloride 0.9 % 100 mL IVPB  1 g Intravenous Q24H Sharen Hones, MD   Stopped at 07/31/22 1606   chlorproMAZINE (THORAZINE) injection 25 mg  25 mg Intramuscular TID PRN Annita Brod, MD   25 mg at 07/28/22 2036   clopidogrel (PLAVIX) tablet 75 mg  75 mg Oral Daily Jose Persia, MD   75 mg at 07/30/22 0819   dextrose 5 % 1,000 mL with potassium chloride 20 mEq, sodium bicarbonate 75 mEq infusion   Intravenous Continuous Lorna Dibble, RPH 75 mL/hr at 08/01/22 0848 Restarted at 08/01/22 0848   enoxaparin (LOVENOX) injection 50 mg  0.5 mg/kg Subcutaneous Q24H Alison Murray, RPH   50 mg at 07/31/22 1528   hydrALAZINE (APRESOLINE) injection 5 mg  5 mg Intravenous Q4H PRN Annita Brod, MD   5 mg at 07/31/22 0418   insulin aspart (novoLOG) injection 0-15 Units  0-15 Units Subcutaneous TID WC Jose Persia, MD   3 Units at 08/01/22 0827   ondansetron (ZOFRAN) tablet 4 mg  4 mg Oral  Q6H PRN Jose Persia, MD       Or   ondansetron [facility]) injection 4 mg  4 mg Intravenous Q6H PRN Jose Persia, MD   4 mg at 07/31/22 0419   pantoprazole (PROTONIX) EC tablet 40 mg  40 mg Oral BID Annita Brod, MD   40 mg at 07/30/22 2042   polyethylene glycol (MIRALAX / GLYCOLAX) packet 17 g  17 g Oral Daily PRN Jose Persia, MD       potassium chloride 10 mEq in 100 mL IVPB  10 mEq Intravenous Once Sharen Hones, MD 100 mL/hr at 08/01/22 0831 10 mEq at 08/01/22 0831   promethazine (PHENERGAN) 12.5 mg in sodium chloride 0.9 % 50 mL IVPB  12.5 mg Intravenous Q6H PRN Annita Brod, MD   Stopped at 07/28/22 1511   sodium chloride flush (NS) 0.9 % injection 3 mL  3 mL Intravenous Q12H Jose Persia, MD   3 mL at 07/31/22 2113   Objective: Vital signs in last 24 hours: Temp:  [97.9 F (36.6 C)-100.4 F (38 C)] 98.4 F (36.9 C) (11/07  0743) Pulse Rate:  [89-110] 89 (11/07 0743) Resp:  [16-18] 16 (11/07 0743) BP: (113-163)/(74-81) 123/76 (11/07 0743) SpO2:  [94 %-96 %] 94 % (11/07 0743)  Intake/Output from previous day: 11/06 0701 - 11/07 0700 In: 150 [IV Piggyback:150] Out: 1000 [Urine:400; Emesis/NG output:600] Intake/Output this shift: No intake/output data recorded.  Physical Exam Vitals and nursing note reviewed.  Constitutional:      General: He is not in acute distress.    Appearance: He is not ill-appearing, toxic-appearing or diaphoretic.  HENT:     Head: Normocephalic and atraumatic.  Pulmonary:     Effort: Pulmonary effort is normal. No respiratory distress.  Skin:    General: Skin is warm and dry.  Neurological:     Mental Status: He is alert.  Psychiatric:        Mood and Affect: Mood normal.        Behavior: Behavior normal.    Lab Results:  Recent Labs    07/31/22 0444 08/01/22 0634  WBC 11.7* 16.6*  HGB 11.3* 12.1*  HCT 34.4* 36.8*  PLT 248 264   BMET Recent Labs    07/31/22 0444 08/01/22 0634  NA 141 138  K 3.6 3.4*  CL 110 102  CO2 22 26  GLUCOSE 153* 169*  BUN 12 15  CREATININE 1.30* 1.87*  CALCIUM 9.0 9.0   Studies/Results: DG Abd Portable 1V-Small Bowel Obstruction Protocol-initial, 8 hr delay  Result Date: 07/31/2022 CLINICAL DATA:  Small bowel obstruction, 8 hour delayed film. EXAM: PORTABLE ABDOMEN - 1 VIEW COMPARISON:  Radiograph earlier today. FINDINGS: Enteric tube is in place. There is enteric contrast within dilated small bowel in the central abdomen. Persistent gaseous small bowel distension. There is no contrast in the colon. IMPRESSION: Enteric contrast within dilated small bowel in the central abdomen. Persistent gaseous small bowel distension. No contrast in the colon. Electronically Signed   By: Keith Rake M.D.   On: 07/31/2022 21:28   DG Chest 2 View  Result Date: 07/31/2022 CLINICAL DATA:  Pneumonia. EXAM: CHEST - 2 VIEW COMPARISON:  July 23, 2022. FINDINGS: The heart size and mediastinal contours are within normal limits. Both lungs are clear. Distal tip of nasogastric tube is seen in expected position of distal esophagus. The visualized skeletal structures are unremarkable. IMPRESSION: No acute cardiopulmonary abnormality seen. Distal tip of nasogastric tube is seen in expected position  of distal esophagus. Electronically Signed   By: Marijo Conception M.D.   On: 07/31/2022 08:48   DG Abd 1 View  Result Date: 07/31/2022 CLINICAL DATA:  Small bowel obstruction. EXAM: ABDOMEN - 1 VIEW COMPARISON:  July 29, 2022 FINDINGS: Nasogastric tube tip is seen in expected position proximal stomach. Stable small bowel dilatation is noted concerning for distal small bowel obstruction. No colonic dilatation is noted. IMPRESSION: Stable small bowel dilatation is noted concerning for distal small bowel obstruction. Electronically Signed   By: Marijo Conception M.D.   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  His UA today appears grossly infected, however I am concerned for sample contamination given difficulty obtaining the sample.  He developed another fever overnight, but is afebrile this morning.  He denies any acute flank pain.  At this point, I have low concern for urosepsis associated with his chronic obstructing left ureteral stone.  No plans for urgent stenting today, he may resume a diet.  May consider adding him on with Dr. Bernardo Heater for nonurgent left ureteral stent placement later this week.  Debroah Loop, PA-C 08/01/2022

## 2022-08-01 NOTE — Progress Notes (Signed)
Urology Inpatient Progress Note  Subjective: No acute events overnight. He developed another fever overnight, Tmax 38C. He is afebrile, VSS this morning. His UA yesterday appears grossly infected with nitrites, >59 WBCs/hpf, >50 RBCs/hpf, and rare bacteria.  Notably, his urine sample was collected from his condom catheter and there was some difficulty collecting a sample.  On antibiotics as below. Creatinine up today, 1.87. WBC count up, 16.6. He reports some right arm pain but denies flank pain.  Anti-infectives: Anti-infectives (From admission, onward)    Start     Dose/Rate Route Frequency Ordered Stop   07/31/22 1430  cefTRIAXone (ROCEPHIN) 1 g in sodium chloride 0.9 % 100 mL IVPB        1 g 200 mL/hr over 30 Minutes Intravenous Every 24 hours 07/31/22 1301     07/27/22 0700  cefTRIAXone (ROCEPHIN) 1 g in sodium chloride 0.9 % 100 mL IVPB  Status:  Discontinued        1 g 200 mL/hr over 30 Minutes Intravenous  Once 07/26/22 1610 07/28/22 1007   07/26/22 1100  cefTRIAXone (ROCEPHIN) 1 g in sodium chloride 0.9 % 100 mL IVPB  Status:  Discontinued       Note to Pharmacy: Call   1 g 200 mL/hr over 30 Minutes Intravenous Every 24 hours 07/25/22 1403 07/26/22 1610   07/25/22 1200  cefTRIAXone (ROCEPHIN) 1 g in sodium chloride 0.9 % 100 mL IVPB       Note to Pharmacy: Call   1 g 200 mL/hr over 30 Minutes Intravenous  Once 07/24/22 1609 07/26/22 1208       Current Facility-Administered Medications  Medication Dose Route Frequency Provider Last Rate Last Admin   acetaminophen (TYLENOL) tablet 650 mg  650 mg Oral Q6H PRN Jose Persia, MD   650 mg at 07/31/22 0418   Or   acetaminophen (TYLENOL) suppository 650 mg  650 mg Rectal Q6H PRN Jose Persia, MD       alum & mag hydroxide-simeth (MAALOX/MYLANTA) 200-200-20 MG/5ML suspension 30 mL  30 mL Oral Q6H PRN Annita Brod, MD   30 mL at 07/31/22 0419   aspirin chewable tablet 81 mg  81 mg Oral Daily Jose Persia, MD   81 mg  at 07/30/22 0819   atorvastatin (LIPITOR) tablet 40 mg  40 mg Oral QPM Jose Persia, MD   40 mg at 07/30/22 1717   cefTRIAXone (ROCEPHIN) 1 g in sodium chloride 0.9 % 100 mL IVPB  1 g Intravenous Q24H Sharen Hones, MD   Stopped at 07/31/22 1606   chlorproMAZINE (THORAZINE) injection 25 mg  25 mg Intramuscular TID PRN Annita Brod, MD   25 mg at 07/28/22 2036   clopidogrel (PLAVIX) tablet 75 mg  75 mg Oral Daily Jose Persia, MD   75 mg at 07/30/22 0819   dextrose 5 % 1,000 mL with potassium chloride 20 mEq, sodium bicarbonate 75 mEq infusion   Intravenous Continuous Lorna Dibble, RPH 75 mL/hr at 08/01/22 0848 Restarted at 08/01/22 0848   enoxaparin (LOVENOX) injection 50 mg  0.5 mg/kg Subcutaneous Q24H Alison Murray, RPH   50 mg at 07/31/22 1528   hydrALAZINE (APRESOLINE) injection 5 mg  5 mg Intravenous Q4H PRN Annita Brod, MD   5 mg at 07/31/22 0418   insulin aspart (novoLOG) injection 0-15 Units  0-15 Units Subcutaneous TID WC Jose Persia, MD   3 Units at 08/01/22 0827   ondansetron (ZOFRAN) tablet 4 mg  4 mg Oral  Q6H PRN Jose Persia, MD       Or   ondansetron [facility]) injection 4 mg  4 mg Intravenous Q6H PRN Jose Persia, MD   4 mg at 07/31/22 0419   pantoprazole (PROTONIX) EC tablet 40 mg  40 mg Oral BID Annita Brod, MD   40 mg at 07/30/22 2042   polyethylene glycol (MIRALAX / GLYCOLAX) packet 17 g  17 g Oral Daily PRN Jose Persia, MD       potassium chloride 10 mEq in 100 mL IVPB  10 mEq Intravenous Once Sharen Hones, MD 100 mL/hr at 08/01/22 0831 10 mEq at 08/01/22 0831   promethazine (PHENERGAN) 12.5 mg in sodium chloride 0.9 % 50 mL IVPB  12.5 mg Intravenous Q6H PRN Annita Brod, MD   Stopped at 07/28/22 1511   sodium chloride flush (NS) 0.9 % injection 3 mL  3 mL Intravenous Q12H Jose Persia, MD   3 mL at 07/31/22 2113   Objective: Vital signs in last 24 hours: Temp:  [97.9 F (36.6 C)-100.4 F (38 C)] 98.4 F (36.9 C) (11/07  0743) Pulse Rate:  [89-110] 89 (11/07 0743) Resp:  [16-18] 16 (11/07 0743) BP: (113-163)/(74-81) 123/76 (11/07 0743) SpO2:  [94 %-96 %] 94 % (11/07 0743)  Intake/Output from previous day: 11/06 0701 - 11/07 0700 In: 150 [IV Piggyback:150] Out: 1000 [Urine:400; Emesis/NG output:600] Intake/Output this shift: No intake/output data recorded.  Physical Exam Vitals and nursing note reviewed.  Constitutional:      General: He is not in acute distress.    Appearance: He is not ill-appearing, toxic-appearing or diaphoretic.  HENT:     Head: Normocephalic and atraumatic.  Pulmonary:     Effort: Pulmonary effort is normal. No respiratory distress.  Skin:    General: Skin is warm and dry.  Neurological:     Mental Status: He is alert.  Psychiatric:        Mood and Affect: Mood normal.        Behavior: Behavior normal.    Lab Results:  Recent Labs    07/31/22 0444 08/01/22 0634  WBC 11.7* 16.6*  HGB 11.3* 12.1*  HCT 34.4* 36.8*  PLT 248 264   BMET Recent Labs    07/31/22 0444 08/01/22 0634  NA 141 138  K 3.6 3.4*  CL 110 102  CO2 22 26  GLUCOSE 153* 169*  BUN 12 15  CREATININE 1.30* 1.87*  CALCIUM 9.0 9.0   Studies/Results: DG Abd Portable 1V-Small Bowel Obstruction Protocol-initial, 8 hr delay  Result Date: 07/31/2022 CLINICAL DATA:  Small bowel obstruction, 8 hour delayed film. EXAM: PORTABLE ABDOMEN - 1 VIEW COMPARISON:  Radiograph earlier today. FINDINGS: Enteric tube is in place. There is enteric contrast within dilated small bowel in the central abdomen. Persistent gaseous small bowel distension. There is no contrast in the colon. IMPRESSION: Enteric contrast within dilated small bowel in the central abdomen. Persistent gaseous small bowel distension. No contrast in the colon. Electronically Signed   By: Keith Rake M.D.   On: 07/31/2022 21:28   DG Chest 2 View  Result Date: 07/31/2022 CLINICAL DATA:  Pneumonia. EXAM: CHEST - 2 VIEW COMPARISON:  July 23, 2022. FINDINGS: The heart size and mediastinal contours are within normal limits. Both lungs are clear. Distal tip of nasogastric tube is seen in expected position of distal esophagus. The visualized skeletal structures are unremarkable. IMPRESSION: No acute cardiopulmonary abnormality seen. Distal tip of nasogastric tube is seen in expected position  of distal esophagus. Electronically Signed   By: Marijo Conception M.D.   On: 07/31/2022 08:48   DG Abd 1 View  Result Date: 07/31/2022 CLINICAL DATA:  Small bowel obstruction. EXAM: ABDOMEN - 1 VIEW COMPARISON:  July 29, 2022 FINDINGS: Nasogastric tube tip is seen in expected position proximal stomach. Stable small bowel dilatation is noted concerning for distal small bowel obstruction. No colonic dilatation is noted. IMPRESSION: Stable small bowel dilatation is noted concerning for distal small bowel obstruction. Electronically Signed   By: Marijo Conception M.D.   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  His UA today appears grossly infected, however I am concerned for sample contamination given difficulty obtaining the sample.  He developed another fever overnight, but is afebrile this morning.  He denies any acute flank pain.  At this point, I have low concern for urosepsis associated with his chronic obstructing left ureteral stone.  No plans for urgent stenting today, he may resume a diet.  May consider adding him on with Dr. Bernardo Heater for nonurgent left ureteral stent placement later this week.  Debroah Loop, PA-C 08/01/2022

## 2022-08-02 ENCOUNTER — Inpatient Hospital Stay: Payer: Medicare Other

## 2022-08-02 ENCOUNTER — Encounter: Payer: Self-pay | Admitting: Internal Medicine

## 2022-08-02 ENCOUNTER — Encounter
Admission: EM | Disposition: A | Discharge status: Skilled Nursing Facility | Payer: Self-pay | Source: Skilled Nursing Facility | Attending: Internal Medicine

## 2022-08-02 ENCOUNTER — Inpatient Hospital Stay: Payer: Medicare Other | Admitting: General Practice

## 2022-08-02 DIAGNOSIS — N201 Calculus of ureter: Secondary | ICD-10-CM | POA: Diagnosis not present

## 2022-08-02 DIAGNOSIS — A498 Other bacterial infections of unspecified site: Secondary | ICD-10-CM

## 2022-08-02 DIAGNOSIS — N261 Atrophy of kidney (terminal): Secondary | ICD-10-CM | POA: Diagnosis not present

## 2022-08-02 DIAGNOSIS — R8281 Pyuria: Secondary | ICD-10-CM | POA: Diagnosis not present

## 2022-08-02 DIAGNOSIS — K56609 Unspecified intestinal obstruction, unspecified as to partial versus complete obstruction: Secondary | ICD-10-CM | POA: Diagnosis not present

## 2022-08-02 HISTORY — PX: CYSTOSCOPY/URETEROSCOPY/HOLMIUM LASER/STENT PLACEMENT: SHX6546

## 2022-08-02 HISTORY — DX: Other bacterial infections of unspecified site: A49.8

## 2022-08-02 LAB — GLUCOSE, CAPILLARY
Glucose-Capillary: 131 mg/dL — ABNORMAL HIGH (ref 70–99)
Glucose-Capillary: 163 mg/dL — ABNORMAL HIGH (ref 70–99)
Glucose-Capillary: 167 mg/dL — ABNORMAL HIGH (ref 70–99)
Glucose-Capillary: 170 mg/dL — ABNORMAL HIGH (ref 70–99)
Glucose-Capillary: 197 mg/dL — ABNORMAL HIGH (ref 70–99)
Glucose-Capillary: 214 mg/dL — ABNORMAL HIGH (ref 70–99)

## 2022-08-02 LAB — CBC
HCT: 37.6 % — ABNORMAL LOW (ref 39.0–52.0)
Hemoglobin: 12 g/dL — ABNORMAL LOW (ref 13.0–17.0)
MCH: 26.5 pg (ref 26.0–34.0)
MCHC: 31.9 g/dL (ref 30.0–36.0)
MCV: 83 fL (ref 80.0–100.0)
Platelets: 256 10*3/uL (ref 150–400)
RBC: 4.53 MIL/uL (ref 4.22–5.81)
RDW: 15.3 % (ref 11.5–15.5)
WBC: 10.4 10*3/uL (ref 4.0–10.5)
nRBC: 0 % (ref 0.0–0.2)

## 2022-08-02 LAB — BASIC METABOLIC PANEL
Anion gap: 9 (ref 5–15)
BUN: 15 mg/dL (ref 8–23)
CO2: 26 mmol/L (ref 22–32)
Calcium: 8.8 mg/dL — ABNORMAL LOW (ref 8.9–10.3)
Chloride: 105 mmol/L (ref 98–111)
Creatinine, Ser: 1.47 mg/dL — ABNORMAL HIGH (ref 0.61–1.24)
GFR, Estimated: 51 mL/min — ABNORMAL LOW (ref >60)
Glucose, Bld: 192 mg/dL — ABNORMAL HIGH (ref 70–99)
Potassium: 3.3 mmol/L — ABNORMAL LOW (ref 3.5–5.1)
Sodium: 140 mmol/L (ref 135–145)

## 2022-08-02 LAB — TRIGLYCERIDES: Triglycerides: 137 mg/dL (ref <150)

## 2022-08-02 LAB — PHOSPHORUS: Phosphorus: 3.3 mg/dL (ref 2.5–4.6)

## 2022-08-02 LAB — MAGNESIUM: Magnesium: 2.3 mg/dL (ref 1.7–2.4)

## 2022-08-02 SURGERY — CYSTOSCOPY/URETEROSCOPY/HOLMIUM LASER/STENT PLACEMENT
Anesthesia: General | Laterality: Left

## 2022-08-02 MED ORDER — AMLODIPINE BESYLATE 5 MG PO TABS
2.5000 mg | ORAL_TABLET | Freq: Every day | ORAL | Status: DC
  Administered 2022-08-02 – 2022-08-03 (×2): 2.5 mg via ORAL
  Filled 2022-08-02 (×2): qty 1

## 2022-08-02 MED ORDER — LACTATED RINGERS IV SOLN: INTRAVENOUS | Status: DC

## 2022-08-02 MED ORDER — ONDANSETRON HCL 4 MG/2ML IJ SOLN
INTRAMUSCULAR | Status: AC
  Filled 2022-08-02: qty 2

## 2022-08-02 MED ORDER — PROPOFOL 10 MG/ML IV BOLUS
INTRAVENOUS | Status: DC | PRN
  Administered 2022-08-02: 150 mg via INTRAVENOUS

## 2022-08-02 MED ORDER — SUGAMMADEX SODIUM 200 MG/2ML IV SOLN
INTRAVENOUS | Status: DC | PRN
  Administered 2022-08-02: 200 mg via INTRAVENOUS

## 2022-08-02 MED ORDER — FENTANYL CITRATE (PF) 100 MCG/2ML IJ SOLN
INTRAMUSCULAR | Status: DC | PRN
  Administered 2022-08-02 (×2): 50 ug via INTRAVENOUS

## 2022-08-02 MED ORDER — METOPROLOL TARTRATE 5 MG/5ML IV SOLN
INTRAVENOUS | Status: DC | PRN
  Administered 2022-08-02: 3 mg via INTRAVENOUS

## 2022-08-02 MED ORDER — MIDAZOLAM HCL 2 MG/2ML IJ SOLN
INTRAMUSCULAR | Status: DC | PRN
  Administered 2022-08-02: 2 mg via INTRAVENOUS

## 2022-08-02 MED ORDER — FENTANYL CITRATE (PF) 100 MCG/2ML IJ SOLN
25.0000 ug | INTRAMUSCULAR | Status: DC | PRN
  Administered 2022-08-02: 25 ug via INTRAVENOUS

## 2022-08-02 MED ORDER — SODIUM CHLORIDE 0.9 % IV SOLN: INTRAVENOUS | Status: DC | PRN

## 2022-08-02 MED ORDER — ALBUTEROL SULFATE HFA 108 (90 BASE) MCG/ACT IN AERS
INHALATION_SPRAY | RESPIRATORY_TRACT | Status: AC
  Filled 2022-08-02: qty 6.7

## 2022-08-02 MED ORDER — GABAPENTIN 300 MG PO CAPS
300.0000 mg | ORAL_CAPSULE | Freq: Three times a day (TID) | ORAL | Status: DC
  Administered 2022-08-02 – 2022-08-07 (×15): 300 mg via ORAL
  Filled 2022-08-02 (×15): qty 1

## 2022-08-02 MED ORDER — CEFTRIAXONE SODIUM 2 G IJ SOLR
INTRAMUSCULAR | Status: AC
  Filled 2022-08-02: qty 20

## 2022-08-02 MED ORDER — KETAMINE HCL 50 MG/5ML IJ SOSY
PREFILLED_SYRINGE | INTRAMUSCULAR | Status: AC
  Filled 2022-08-02: qty 5

## 2022-08-02 MED ORDER — MIDAZOLAM HCL 2 MG/2ML IJ SOLN
INTRAMUSCULAR | Status: AC
  Filled 2022-08-02: qty 2

## 2022-08-02 MED ORDER — PHENAZOPYRIDINE HCL 200 MG PO TABS
200.0000 mg | ORAL_TABLET | Freq: Once | ORAL | Status: AC
  Administered 2022-08-02: 200 mg via ORAL
  Filled 2022-08-02: qty 1

## 2022-08-02 MED ORDER — FENTANYL CITRATE (PF) 100 MCG/2ML IJ SOLN
INTRAMUSCULAR | Status: AC
  Administered 2022-08-02: 25 ug via INTRAVENOUS
  Filled 2022-08-02: qty 2

## 2022-08-02 MED ORDER — ACETAMINOPHEN 10 MG/ML IV SOLN
INTRAVENOUS | Status: AC
  Filled 2022-08-02: qty 100

## 2022-08-02 MED ORDER — ESMOLOL HCL 100 MG/10ML IV SOLN
INTRAVENOUS | Status: DC | PRN
  Administered 2022-08-02: 20 mg via INTRAVENOUS

## 2022-08-02 MED ORDER — DEXAMETHASONE SODIUM PHOSPHATE 10 MG/ML IJ SOLN
INTRAMUSCULAR | Status: DC | PRN
  Administered 2022-08-02: 5 mg via INTRAVENOUS

## 2022-08-02 MED ORDER — PHENYLEPHRINE HCL (PRESSORS) 10 MG/ML IV SOLN
INTRAVENOUS | Status: DC | PRN
  Administered 2022-08-02: 120 ug via INTRAVENOUS
  Administered 2022-08-02: 160 ug via INTRAVENOUS
  Administered 2022-08-02: 120 ug via INTRAVENOUS

## 2022-08-02 MED ORDER — ROCURONIUM BROMIDE 100 MG/10ML IV SOLN
INTRAVENOUS | Status: DC | PRN
  Administered 2022-08-02: 20 mg via INTRAVENOUS
  Administered 2022-08-02: 50 mg via INTRAVENOUS

## 2022-08-02 MED ORDER — LIDOCAINE HCL (PF) 2 % IJ SOLN
INTRAMUSCULAR | Status: AC
  Filled 2022-08-02: qty 5

## 2022-08-02 MED ORDER — ALLOPURINOL 100 MG PO TABS
100.0000 mg | ORAL_TABLET | Freq: Every day | ORAL | Status: DC
  Administered 2022-08-02 – 2022-08-07 (×6): 100 mg via ORAL
  Filled 2022-08-02 (×6): qty 1

## 2022-08-02 MED ORDER — KETAMINE HCL 10 MG/ML IJ SOLN
INTRAMUSCULAR | Status: DC | PRN
  Administered 2022-08-02: 20 mg via INTRAVENOUS

## 2022-08-02 MED ORDER — OXYCODONE HCL 5 MG/5ML PO SOLN: 5.0000 mg | Freq: Once | ORAL | Status: DC | PRN

## 2022-08-02 MED ORDER — ACETAMINOPHEN 10 MG/ML IV SOLN: 1000.0000 mg | Freq: Once | INTRAVENOUS | Status: DC | PRN

## 2022-08-02 MED ORDER — SERTRALINE HCL 50 MG PO TABS
150.0000 mg | ORAL_TABLET | Freq: Every day | ORAL | Status: DC
  Administered 2022-08-02 – 2022-08-07 (×6): 150 mg via ORAL
  Filled 2022-08-02 (×6): qty 3

## 2022-08-02 MED ORDER — METOPROLOL TARTRATE 5 MG/5ML IV SOLN
INTRAVENOUS | Status: AC
  Filled 2022-08-02: qty 5

## 2022-08-02 MED ORDER — ONDANSETRON HCL 4 MG/2ML IJ SOLN: 4.0000 mg | Freq: Once | INTRAMUSCULAR | Status: DC | PRN

## 2022-08-02 MED ORDER — TRAVASOL 10 % IV SOLN
INTRAVENOUS | Status: AC
  Filled 2022-08-02: qty 1166.4

## 2022-08-02 MED ORDER — ARIPIPRAZOLE 2 MG PO TABS
2.0000 mg | ORAL_TABLET | Freq: Every day | ORAL | Status: DC
  Administered 2022-08-03 – 2022-08-07 (×5): 2 mg via ORAL
  Filled 2022-08-02 (×5): qty 1

## 2022-08-02 MED ORDER — ONDANSETRON HCL 4 MG/2ML IJ SOLN
INTRAMUSCULAR | Status: DC | PRN
  Administered 2022-08-02: 4 mg via INTRAVENOUS

## 2022-08-02 MED ORDER — ROCURONIUM BROMIDE 10 MG/ML (PF) SYRINGE
PREFILLED_SYRINGE | INTRAVENOUS | Status: AC
  Filled 2022-08-02: qty 10

## 2022-08-02 MED ORDER — POTASSIUM CHLORIDE 10 MEQ/100ML IV SOLN
10.0000 meq | INTRAVENOUS | Status: AC
  Administered 2022-08-02 (×3): 10 meq via INTRAVENOUS
  Filled 2022-08-02 (×2): qty 100

## 2022-08-02 MED ORDER — ACETAMINOPHEN 10 MG/ML IV SOLN
INTRAVENOUS | Status: DC | PRN
  Administered 2022-08-02: 1000 mg via INTRAVENOUS

## 2022-08-02 MED ORDER — LIDOCAINE HCL (CARDIAC) PF 100 MG/5ML IV SOSY
PREFILLED_SYRINGE | INTRAVENOUS | Status: DC | PRN
  Administered 2022-08-02: 100 mg via INTRAVENOUS

## 2022-08-02 MED ORDER — FENTANYL CITRATE (PF) 100 MCG/2ML IJ SOLN
INTRAMUSCULAR | Status: AC
  Filled 2022-08-02: qty 2

## 2022-08-02 MED ORDER — OXYCODONE HCL 5 MG PO TABS: 5.0000 mg | ORAL_TABLET | Freq: Once | ORAL | Status: DC | PRN

## 2022-08-02 SURGICAL SUPPLY — 31 items
BAG DRAIN SIEMENS DORNER NS (MISCELLANEOUS) ×1 IMPLANT
BAG URINE DRAIN 2000ML AR STRL (UROLOGICAL SUPPLIES) IMPLANT
BASKET ZERO TIP 1.9FR (BASKET) IMPLANT
BRUSH SCRUB EZ 1% IODOPHOR (MISCELLANEOUS) ×1 IMPLANT
CATH FOLEY 2WAY  5CC 16FR (CATHETERS) ×1
CATH URET FLEX-TIP 2 LUMEN 10F (CATHETERS) IMPLANT
CATH URETL OPEN END 6X70 (CATHETERS) IMPLANT
CATH URTH 16FR FL 2W BLN LF (CATHETERS) IMPLANT
CNTNR SPEC 2.5X3XGRAD LEK (MISCELLANEOUS) ×1
CONT SPEC 4OZ STER OR WHT (MISCELLANEOUS) ×1
CONTAINER SPEC 2.5X3XGRAD LEK (MISCELLANEOUS) IMPLANT
DRAPE UTILITY 15X26 TOWEL STRL (DRAPES) ×1 IMPLANT
FIBER LASER MOSES 200 DFL (Laser) IMPLANT
GLOVE SURG UNDER POLY LF SZ7.5 (GLOVE) ×1 IMPLANT
GOWN STRL REUS W/ TWL LRG LVL3 (GOWN DISPOSABLE) ×1 IMPLANT
GOWN STRL REUS W/ TWL XL LVL3 (GOWN DISPOSABLE) ×1 IMPLANT
GOWN STRL REUS W/TWL LRG LVL3 (GOWN DISPOSABLE) ×1
GOWN STRL REUS W/TWL XL LVL3 (GOWN DISPOSABLE) ×1
GUIDEWIRE STR DUAL SENSOR (WIRE) ×1 IMPLANT
GUIDEWIRE STR ZIPWIRE 035X150 (MISCELLANEOUS) IMPLANT
IV NS IRRIG 3000ML ARTHROMATIC (IV SOLUTION) ×1 IMPLANT
KIT TURNOVER CYSTO (KITS) ×1 IMPLANT
PACK CYSTO AR (MISCELLANEOUS) ×1 IMPLANT
SET CYSTO W/LG BORE CLAMP LF (SET/KITS/TRAYS/PACK) ×1 IMPLANT
SHEATH NAVIGATOR HD 12/14X36 (SHEATH) IMPLANT
STENT URET 6FRX24 CONTOUR (STENTS) IMPLANT
STENT URET 6FRX26 CONTOUR (STENTS) IMPLANT
SURGILUBE 2OZ TUBE FLIPTOP (MISCELLANEOUS) ×1 IMPLANT
TRAP FLUID SMOKE EVACUATOR (MISCELLANEOUS) ×1 IMPLANT
VALVE UROSEAL ADJ ENDO (VALVE) IMPLANT
WATER STERILE IRR 500ML POUR (IV SOLUTION) ×1 IMPLANT

## 2022-08-02 NOTE — Progress Notes (Signed)
       CROSS COVER NOTE  NAME: Rodney Estrada MRN: [id number] DOB : [birth year]    Time of Service   2134  HPI/Events of Note   Nurse reports patient with severe pain from foley/urinary tract and he refused acetaminophen previously ordered. Patient is s/p cystoscopy with ureteral stent placed  Assessment and  Interventions   Assessment:  Plan: Dose of pyridium ordered for urinary tract discomfort, likely spasmodic      Kathlene Cote NP [facility]

## 2022-08-02 NOTE — Progress Notes (Signed)
Subjective:  CC: Rodney Estrada is a 71 y.o. male  Hospital stay day 8, sbo  HPI: No acute issues overnight. Mult BM. No pain  ROS:  General: Denies weight loss, weight gain, fatigue, fevers, chills, and night sweats. Heart: Denies chest pain, palpitations, racing heart, irregular heartbeat, leg pain or swelling, and decreased activity tolerance. Respiratory: Denies breathing difficulty, shortness of breath, wheezing, cough, and sputum. GI: Denies change in appetite, heartburn, nausea, vomiting, constipation, diarrhea, and blood in stool. GU: Denies difficulty urinating, pain with urinating, urgency, frequency, blood in urine.   Objective:   Temp:  [97.6 F (36.4 C)-98.6 F (37 C)] 97.9 F (36.6 C) (11/08 1254) Pulse Rate:  [69-84] 69 (11/08 1254) Resp:  [13-18] 13 (11/08 1254) BP: (133-180)/(78-83) 144/80 (11/08 1254) SpO2:  [95 %-99 %] 97 % (11/08 1254) Weight:  [98.2 kg] 98.2 kg (11/08 0611)     Height: '5\' 9"'$  (175.3 cm) Weight: 98.2 kg BMI (Calculated): 31.96   Intake/Output this shift:   Intake/Output Summary (Last 24 hours) at 08/02/2022 1311 Last data filed at 08/02/2022 1034 Gross per 24 hour  Intake 3279.14 ml  Output 1600 ml  Net 1679.14 ml    Constitutional :  alert, cooperative, appears stated age, and no distress  Respiratory:  clear to auscultation bilaterally  Cardiovascular:  regular rate and rhythm  Gastrointestinal: Soft, no guarding, distention improved .   Skin: Cool and moist.   Psychiatric: Normal affect, non-agitated, not confused       LABS:     Latest Ref Rng & Units 08/02/2022    6:08 AM 08/01/2022    6:34 AM 07/31/2022    4:44 AM  CMP  Glucose 70 - 99 mg/dL 192  169  153   BUN 8 - 23 mg/dL '15  15  12   '$ Creatinine 0.61 - 1.24 mg/dL 1.47  1.87  1.30   Sodium 135 - 145 mmol/L 140  138  141   Potassium 3.5 - 5.1 mmol/L 3.3  3.4  3.6   Chloride 98 - 111 mmol/L 105  102  110   CO2 22 - 32 mmol/L '26  26  22   '$ Calcium 8.9 - 10.3 mg/dL 8.8  9.0   9.0       Latest Ref Rng & Units 08/02/2022    6:08 AM 08/01/2022    6:34 AM 07/31/2022    4:44 AM  CBC  WBC 4.0 - 10.5 K/uL 10.4  16.6  11.7   Hemoglobin 13.0 - 17.0 g/dL 12.0  12.1  11.3   Hematocrit 39.0 - 52.0 % 37.6  36.8  34.4   Platelets 150 - 400 K/uL 256  264  248     RADS: Narrative & Impression  CLINICAL DATA:  Ileus. Small-bowel obstruction. 24 hour delayed film.   EXAM: PORTABLE ABDOMEN - 1 VIEW   COMPARISON:  Radiograph yesterday.   FINDINGS: Enteric contrast is seen within the ascending, descending and rectosigmoid colon. There is persistent gaseous small bowel distension centrally up to 4.8 cm. Enteric tube remains in place.   IMPRESSION: Enteric contrast has reached the ascending, descending and rectosigmoid colon. Persistent gaseous small bowel distension centrally up to 4.8 cm, findings suggest ileus or partial obstruction.     Electronically Signed   By: Keith Rake M.D.   On: 08/01/2022 22:36     Assessment:    imaging as noted above but clinically has resolved, with continued bowel movements and tolerating clears, no pain on abdominal  exam. Removed NG at bedside today, recommend advancing to regular diet post procedure and monitor for recurrent issues.  Suspect bowel dilation is residual from resolving episode of SBO  labs/images/medications/previous chart entries reviewed personally and relevant changes/updates noted above.

## 2022-08-02 NOTE — Op Note (Signed)
Preoperative diagnosis:  Left proximal ureteral calculus with chronic left renal obstruction Left renal atrophy secondary to above Pyuria  Postoperative diagnosis:  Same  Procedure: Cystoscopy with left retrograde pyelogram Left ureteroscopy with laser lithotripsy Left ureteral stent placement  Surgeon: Abbie Sons, MD  Anesthesia: General  Complications: None  Intraoperative findings:  Cystoscopy-urethra normal in caliber without stricture.  Prostate mild-moderate lateral lobe enlargement with moderate bladder neck elevation.  Bladder with moderate trabeculation and no solid or papillary lesions.  UOs normal-appearing bilaterally Left retrograde pyelogram-left proximal ureteral calculus with normal-appearing ureter distal to the calculus and dilated ureter proximally.  Due to stone impaction unable to opacify renal pelvis on initial retrograde.  Subsequent repeat retrograde with severe left hydronephrosis Left ureteroscopy impacted stone lower proximal ureter.  Unable to advance guidewire beyond the stone  EBL: Minimal  Specimens: Urine from renal pelvis for culture  Indication: Rodney Estrada is a 71 y.o. admitted 1 week ago with small bowel obstruction of undetermined etiology.  CT showed severe left hydronephrosis secondary to a lower proximal ureteral calculus.  Previous imaging in July 2021 remarkable for severe left hydronephrosis and hydroureter to the same area.  CT during this hospitalization with parenchymal atrophy.  Stent placement was discussed to attempt and preserve any remaining renal function.  He initially declined stent placement and was rescheduled last week.  Since the procedure was not urgent anesthesia was concerned with complications due to his SBO and reflux.  Recently with low-grade fever and urinalysis showing pyuria.  Added on for stent placement.  After reviewing the management options for treatment, he elected to proceed with the above surgical  procedure(s). We have discussed the potential benefits and risks of the procedure, side effects of the proposed treatment, the likelihood of the patient achieving the goals of the procedure, and any potential problems that might occur during the procedure or recuperation. Informed consent has been obtained.  Description of procedure:  The patient was taken to the operating room and general anesthesia was induced.  The patient was placed in the dorsal lithotomy position, prepped and draped in the usual sterile fashion, and preoperative antibiotics were administered. A preoperative time-out was performed.   A 21 French cystoscope was lubricated, inserted per urethra and advanced proximally under direct vision with findings as described above.  A 0.038 Sensor wire was placed through the cystoscope and into the left UO.  The wire was advanced proximally however was unable to be advanced beyond the calculus which was easily seen on fluoroscopy.  A 6 French open-ended ureteral catheter was then placed through the cystoscope and positioned at the left UO.  A 0.035 Zip wire was placed through the catheter and into the left UO.  The catheter was advanced to just below the calculus.  The wire would not advance beyond the stone.  Retrograde pyelogram was performed with findings as described above.  The cystoscope was removed and a 4.5 French semirigid ureteroscope was passed per urethra.  The scope was easily inserted into the left UO and advanced to just below the stone without difficulty.  Multiple attempts at passing the guidewire beyond the calculus were unsuccessful secondary to impaction.  It was elected at this point to perform laser lithotripsy until ureteral continuity was established.  A 200 m Moses holmium laser fiber was placed through the ureteroscope and fragmentation was commenced at a setting of 0.3J/40 Hz.  An opening was subsequently visualized in the 2 o'clock position.  The Sensor wire was  placed through the ureteroscope and advanced into the renal pelvis without difficulty.  The ureteroscope was removed.  The ureteral catheter was advanced over the wire to the renal pelvis and the guidewire was removed.  10 cc of cloudy urine was aspirated and sent for culture.  Retrograde pyelogram was then performed with findings as described above.  The guidewire was replaced and a 80F/26 cm Contour ureteral stent was placed under fluoroscopic guidance.  The proximal curl was within an upper pole calyx and the distal end stent was well positioned in the bladder.  A 16 French Foley catheter was placed to maximize urinary drainage.  The balloon was inflated with 10 mL of sterile water and placed to gravity drainage.  After anesthetic reversal he was transported to PACU in stable condition.  Plan: Will need subsequent completion of laser lithotripsy/stone removal in the next few weeks  Abbie Sons, M.D.

## 2022-08-02 NOTE — Anesthesia Preprocedure Evaluation (Addendum)
Anesthesia Evaluation  Patient identified by MRN, date of birth, ID band Patient awake    Reviewed: Allergy & Precautions, NPO status , Patient's Chart, lab work & pertinent test results  History of Anesthesia Complications Negative for: history of anesthetic complications  Airway Mallampati: IV   Neck ROM: Full    Dental  (+) Missing   Pulmonary former smoker (quit 2020)   Pulmonary exam normal breath sounds clear to auscultation       Cardiovascular hypertension, + CAD (coronary artery aneurysm)  Normal cardiovascular exam(-) dysrhythmias  Rhythm:Regular Rate:Normal  ECG 07/23/22:  Sinus or ectopic atrial rhythm Prolonged PR interval Nonspecific IVCD with LAD Inferior infarct, old Anterior infarct, old  Echo 08/29/21:  NORMAL LEFT VENTRICULAR SYSTOLIC FUNCTION  NORMAL RIGHT VENTRICULAR SYSTOLIC FUNCTION  MILD VALVULAR REGURGITATION  NO VALVULAR STENOSIS  TECHNICALLY DIFFICULT STUDY     Neuro/Psych  PSYCHIATRIC DISORDERS  Depression    CVA (left hemiplegia)    GI/Hepatic Recurrent SBO   Endo/Other  diabetes, Type 2  Obesity   Renal/GU Renal disease (nephrolithiasis, stage III CKD)     Musculoskeletal   Abdominal   Peds  Hematology  (+) Blood dyscrasia, anemia   Anesthesia Other Findings Cardiology note 09/27/21:  Plan -Continue current medical regimen of anti-platelet medication and management of coronary artery disease, hypertension, hyperlipidemia, diabetes, carotid atherosclerosis and cerebrovascular disease without change today to reduce future morbidity and mortality. The patient understands future goals of treatment. We have had a long discussion of peripheral vascular disease as well as carotid atherosclerosis and the future risks of morbidity and mortality. The patient will continue to reduce cardiovascular risk factors to the most significant degree possible and will continue anti-platelet  therapy -There has been a discussion of the current guidelines for hypertension control. We will continue current medical regimen for hypertension control which will also help in risk factor modification of cardiovascular disease. The patient understands and agrees with the current plan. We will be watching for possible future side effects of these medications. Additional home blood pressure monitoring is recommended if able. -No further intervention shortness of breath reported above multifactorial in nature including age, decreased exercise tolerance, disability, and/or medication management. The patient is to follow for any worsening symptoms or increase in severity for further in need in investigation or treatment options. -We have discussed risk reduction in the cardiovascular disease process by a continuation of lipid management with the current medication management for lipid reduction. The goals continue to be 30-50% lowering of LDL cholesterol in addition to lifestyle measures. This will include diet and improved activity level on a regular basis.The patient has an understanding of this discussion at this time and we will continue the appropriate strategy. -We have had a long discussion about the benefits of physical and occupational rehabilitation. The patient is advised and encouraged to enroll for improvements in quality of life and reduced hospitalization.  No orders of the defined types were placed in this encounter.  Return in about 1 year (around 09/27/2022).    Reproductive/Obstetrics                             Anesthesia Physical Anesthesia Plan  ASA: 3  Anesthesia Plan: General   Post-op Pain Management:    Induction: Intravenous  PONV Risk Score and Plan: 1 and Ondansetron, Dexamethasone and Treatment may vary due to age or medical condition  Airway Management Planned: Oral ETT  Additional Equipment:  Intra-op Plan:   Post-operative Plan:  Extubation in OR  Informed Consent: I have reviewed the patients History and Physical, chart, labs and discussed the procedure including the risks, benefits and alternatives for the proposed anesthesia with the patient or authorized representative who has indicated his/her understanding and acceptance.     Dental advisory given  Plan Discussed with: CRNA  Anesthesia Plan Comments: (Patient consented for risks of anesthesia including but not limited to:  - adverse reactions to medications - damage to eyes, teeth, lips or other oral mucosa - nerve damage due to positioning  - sore throat or hoarseness - damage to heart, brain, nerves, lungs, other parts of body or loss of life  Informed patient about role of CRNA in peri- and intra-operative care.  Patient voiced understanding.)       Anesthesia Quick Evaluation

## 2022-08-02 NOTE — Progress Notes (Signed)
PHARMACY - TOTAL PARENTERAL NUTRITION CONSULT NOTE   Indication: Small bowel obstruction  Patient Measurements: Height: '5\' 9"'$  (175.3 cm) Weight: 98.2 kg (216 lb 7.9 oz) IBW/kg (Calculated) : 70.7 TPN AdjBW (KG): 78.5 Body mass index is 31.97 kg/m.  Assessment:  71 y.o. male with medical history significant of prior CVA with residual left hemiparesis, hypertension, CAD, depression who presented to the ED with complaints of brown emesis, SBO.   Glucose / Insulin: BG 118-197 previous 24h (12 units SSI required) Electrolytes: hypokalemic   replace w/ KCL runs outside of TPN today 11/8 Renal:  SCr 2.12-->1.39-->1.87-->1.47 (unknown BL) Hepatic: AST/ALT wnl TG: 137 Intake / Output; MIVF:  GI Imaging:  11/06: Persistent gaseous SBO on Abd x-ray GI Surgeries / Procedures: no recent   Central access: 08/01/22 (pending placement) TPN start date: 08/01/22  Nutritional Goals: Goal TPN rate is 90 mL/hr (provides 116 g of protein and 2384 kcals per day)  RD Assessment: pending Estimated Needs Total Energy Estimated Needs: 2100-2400kcal/day Total Protein Estimated Needs: 105-120g/day Total Fluid Estimated Needs: 1.9-2.2L/day  Current Nutrition: CLD  Plan:  --increase TPN to goal rate of 90 mL/hr at 1800 --Nutritional components: Amino acids: 116 grams Dextrose 352 grams Lipids: 72 grams kCal 2383 per24h --Electrolytes in TPN (standard): Na 44mq/L, K 553m/L, Ca 52m74mL, Mg 52mE23m, and Phos 152mm67m. Cl:Ac 1:1 - K 3.3-  Will order KCL 10 meq IV x 3 doses. Increasing TPN rate today 11/8- will reassess electrolytes in am --Add standard MVI, thiamine '100mg'$  x 3days (last day to be 11/9) and trace elements to TPN --adjust Sensitive to q6h SSI and adjust as needed   Monitor TPN labs daily until stable then Mon/Thurs  Janssen Zee A 08/02/2022,8:12 AM

## 2022-08-02 NOTE — Anesthesia Postprocedure Evaluation (Signed)
Anesthesia Post Note  Patient: Rodney Estrada  Procedure(s) Performed: CYSTOSCOPY/URETEROSCOPY/HOLMIUM LASER/STENT PLACEMENT (Left)  Patient location during evaluation: PACU Anesthesia Type: General Level of consciousness: awake and alert Pain management: pain level controlled Vital Signs Assessment: post-procedure vital signs reviewed and stable Respiratory status: spontaneous breathing, nonlabored ventilation, respiratory function stable and patient connected to nasal cannula oxygen Cardiovascular status: blood pressure returned to baseline and stable Postop Assessment: no apparent nausea or vomiting Anesthetic complications: no   No notable events documented.   Last Vitals:  Vitals:   08/02/22 1621 08/02/22 1711  BP: (!) 172/85 (!) 172/85  Pulse: 73 73  Resp: 16 15  Temp: 36.9 C 36.8 C  SpO2: 97% 99%    Last Pain:  Vitals:   08/02/22 1711  TempSrc: Oral  PainSc:                  Molli Barrows

## 2022-08-02 NOTE — Progress Notes (Signed)
PROGRESS NOTE Rodney Estrada  BHA:[id number] DOB: April 03, 1951 DOA: 07/23/2022 PCP: Pcp, No   Brief Narrative/Hospital Course: 71 yom w/ hx of CVA with left-sided hemiparesis, hypertension and depression who presented to the emergency room on 10/29 with 1 episode of emesis, found to have partial versus early small bowel obstruction that look to be mild.  Patient also noted to have large calculus with moderate proximal left hydroureteronephrosis.  Patient seen by urology who felt findings were chronic.  Patient seen by general surgery who felt a small bowel obstruction could be managed conservatively.  By 10/30, obstruction felt to have resolved and patient started on clear liquids.  Diet advanced and by 10/31, tolerating soft bland food.  However, starting evening of 10/31, patient started having nausea and vomiting.  Repeat x-ray on 11/1 noted recurrence of small bowel obstruction.  With persistent obstruction, NG tube placed by interventional radiology 11/3.  Urology met with patient on 10/31 about placing stent.  Recommendation is to have it done sooner than later to preserve remaining renal function as long as it is more than 20%.  After some consideration, patient amenable, and urology planning to take patient to OR once SBO resolves.  Patient appears to have persistent small bowel obstruction on x-ray, but was able to have bowel movements.  General surgery is following.  Central line placed and TPN .He had intermittent fever, started on Rocephin on 11/6, urology is still planning for surgery later this week. 11/8: NG tube removed, surgery signed off with instruction to start regular diet after urological procedure     Subjective: Seen and examined this morning.  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  Managed with NG tube decompression supportive care IV fluids.  Seen by surgery, NG tube removed subsequently consulted and surgery signed off and advise diet after urological procedure.  On TPN w/ central line since 11/7> once starting diet we will discontinue  Acute kidney injury on CKD stage IIIa: Peaked to 2.1.  Creatinine downtrending.  Continue TPN,monitor Recent Labs    07/23/22 0346 07/24/22 0457 07/25/22 0454 07/27/22 0529 07/28/22 0441 07/29/22 0432 07/30/22 0400 07/31/22 0444 08/01/22 0634 08/02/22 0608  BUN 48* 53* 42* 28* _0 CREATININE 2.12* 2.16* 1.88* 1.56* 1.39* 1.33* 1.31* 1.30* 1.87* 8.34*    Mild metabolic acidosis: Hyporkalemia Hypernatremia: Electrolytes stable  w/ mild hypokalemia. Cont TPN   Obstructive nephrolithiasis with hydronephrosis Fever: He has a history of nephrolithiasis with CT imaging notable for large calculus with moderate proximal left hydroureteronephrosis.  Urology feels that this is chronic, but given patient's recurrent fever leukocytosis, urology consulted with plan for stent placement 11/8.No fever since 11/6, leukocytosis resolved. cont ceftriaxone.  Repeat blood culture 11/7 NGTD.  History of a stroke right MCA status post tPA and mechanical thrombectomy with stent placement: On  aspirin 81, Plavix 75 and Lipitor 40.  Essential hypertension: PTA amlodipine, clonidine Lasix labetalol/lisinopril.  BP borderline controlled resume some of the meds soon  Type 2 diabetes on  long-term insulin, PTA on Levemir 18 units twice daily and SSI.  Blood sugars controlled, on SSI 0-15 units Recent Labs  Lab 08/01/22 2142 08/01/22 2338 08/02/22 0424 08/02/22 1126 08/02/22 1309  GLUCAP 155* 153* 197* 170* 163*    Class I Obesity:Patient's Body mass index is 31.97 kg/m. : Will benefit with PCP follow-up, weight loss  healthy lifestyle and outpatient sleep evaluation.   DVT prophylaxis: lovenox Code Status:   Code Status: Full Code Family Communication: plan of care discussed with patient at bedside. Patient status is: Inpatient because of bowel obstruction, Level of care: Med-Surg   Dispo: The patient is from: Home            Anticipated disposition: Home in next 1 to 2 days if remains fever free  Objective: Vitals last 24 hrs: Vitals:   08/02/22 0427 08/02/22 0611 08/02/22 0827 08/02/22 1254  BP: (!) 180/78  (!) 151/80 (!) 144/80  Pulse: 72  84 69  Resp: _0 Temp: 97.8 F (36.6 C)  98.5 F (36.9 C) 97.9 F (36.6 C)  TempSrc: Oral  Oral Temporal  SpO2: 97%  95% 97%  Weight:  98.2 kg    Height:       Weight change:   Physical Examination: General exam: alert awake, 71 older than stated age HEENT:Oral mucosa moist, Ear/Nose WNL grossly Respiratory system: bilaterally clear BS, no use of accessory muscle Cardiovascular system: S1 & S2 +, No JVD. Gastrointestinal system: Abdomen soft,moderately distended/full/obese abdomenNT,BS+ Nervous System:Alert, awake, moving extremities. Extremities: LE edema neg,distal peripheral pulses palpable.  Skin: No rashes,no icterus. MSK: Normal muscle bulk,tone, power  Medications reviewed:  Scheduled Meds:  [MAR Hold] aspirin  81 mg Oral Daily   [MAR Hold] atorvastatin  40 mg Oral QPM   [MAR Hold] Chlorhexidine Gluconate Cloth  6 each Topical Daily   [MAR Hold] clopidogrel  75 mg Oral Daily   [MAR Hold] enoxaparin (LOVENOX) injection  0.5 mg/kg Subcutaneous Q24H   [MAR Hold] insulin aspart  0-15 Units  Subcutaneous Q6H   [MAR Hold] pantoprazole  40 mg Oral BID   [MAR Hold] sodium chloride flush  3 mL Intravenous Q12H   Continuous Infusions:  [MAR Hold] cefTRIAXone (ROCEPHIN)  IV Stopped (08/01/22 1631)   [MAR Hold] promethazine (PHENERGAN) injection (IM or IVPB) Stopped (07/28/22 1511)   TPN ADULT (ION) 45 mL/hr at 08/02/22 0950   TPN ADULT (ION)        Diet Order             Diet NPO time specified Except for: Sips with Meds  Diet effective midnight                  Intake/Output Summary (Last 24 hours) at 08/02/2022 1343 Last data filed at 08/02/2022 1034 Gross per 24 hour  Intake 3279.14 ml  Output 1600 ml  Net 1679.14 ml   Net IO Since Admission: 5.79 mL [08/02/22 1343]  Wt Readings from Last 3 Encounters:  08/02/22 98.2 kg  04/11/20 99.1 kg    Unresulted Labs (From admission, onward)     Start     Ordered   08/03/22 0500  Comprehensive metabolic panel  (TPN Lab Panel)  Every Mon,Thu (0500),   TIMED      08/01/22 1229   08/03/22 0500  Magnesium  (TPN Lab Panel)  Every Mon,Thu (0500),   TIMED      08/01/22 1229   08/03/22 0500  Phosphorus  (TPN Lab Panel)  Every Mon,Thu (0500),   TIMED      08/01/22 1229   08/03/22 0500  Triglycerides  (TPN Lab Panel)  Every Mon,Thu (0500),   TIMED      08/01/22 1229          Data Reviewed: I have personally reviewed following labs and imaging studies CBC: Recent Labs  Lab 07/29/22 0432 07/30/22 0400 07/31/22 0444 08/01/22 0634 08/02/22 0608  WBC 9.5 9.4 11.7* 16.6* 10.4  HGB 11.5* 12.2* 11.3* 12.1* 12.0*  HCT 36.0* 37.8* 34.4* 36.8* 37.6*  MCV 84.3 84.4 81.7 82.1 83.0  PLT 220 266 248 264 810   Basic Metabolic Panel: Recent Labs  Lab 07/29/22 0432 07/30/22 0400 07/31/22 0444 08/01/22 0634 08/02/22 0608  NA 143 146* 141 138 140  K 4.0 3.7 3.6 3.4* 3.3*  CL 117* 119* 110 102 105  CO2 20* 18* _0 GLUCOSE 96 142* 153* 169* 192*  BUN _1 CREATININE 1.33* 1.31* 1.30* 1.87* 1.47*  CALCIUM  9.1 9.4 9.0 9.0 8.8*  MG  --  2.0 1.6* 2.3 2.3  PHOS  --   --   --  3.1 3.3   Recent Results (from the past 240 hour(s))  Surgical PCR screen     Status: None   Collection Time: 07/25/22  4:47 AM   Specimen: Nasal Mucosa; Nasal Swab  Result Value Ref Range Status   MRSA, PCR NEGATIVE NEGATIVE Final   Staphylococcus aureus NEGATIVE NEGATIVE Final    Comment: (NOTE) The Xpert SA Assay (FDA approved for NASAL specimens in patients 59 years of age and older), is one component of a comprehensive surveillance program. It is not intended to diagnose infection nor to guide or monitor treatment. Performed at [facility], [address] [place], [place] [address]   Culture, blood (Routine X 2) w Reflex to ID Panel     Status: None (Preliminary result)   Collection Time: 08/01/22 12:28 PM   Specimen: BLOOD  Result Value Ref Range Status   Specimen Description BLOOD BLOOD LEFT HAND  Final   Special Requests   Final    BOTTLES DRAWN AEROBIC AND ANAEROBIC Blood Culture adequate volume   Culture   Final    NO GROWTH < 24 HOURS Performed at [facility], [address], [place], [place] [address]    Report Status PENDING  Incomplete  Culture, blood (Routine X 2) w Reflex to ID Panel     Status: None (Preliminary result)   Collection Time: 08/01/22 12:38 PM   Specimen: BLOOD  Result Value Ref Range Status   Specimen Description BLOOD BLOOD RIGHT HAND  Final   Special Requests   Final    BOTTLES DRAWN AEROBIC AND ANAEROBIC Blood Culture adequate volume   Culture   Final    NO GROWTH < 24 HOURS Performed at [facility], [address] [place],  [address]    Report Status PENDING  Incomplete    Antimicrobials: Anti-infectives (From admission, onward)    Start     Dose/Rate Route Frequency Ordered Stop   08/01/22 1430  [MAR Hold]  cefTRIAXone (ROCEPHIN) 2 g in sodium chloride 0.9 % 100 mL IVPB        (MAR Hold since Wed 08/02/2022 at 1252.Hold  Reason:  Transfer to a Procedural area)   2 g 200 mL/hr over 30 Minutes Intravenous Every 24 hours 08/01/22 1152     07/31/22 1430  cefTRIAXone (ROCEPHIN) 1 g in sodium chloride 0.9 % 100 mL IVPB  Status:  Discontinued        1 g 200 mL/hr over 30 Minutes Intravenous Every 24 hours 07/31/22 1301 08/01/22 1152   07/27/22 0700  cefTRIAXone (ROCEPHIN) 1 g in sodium chloride 0.9 % 100 mL IVPB  Status:  Discontinued        1 g 200 mL/hr over 30 Minutes Intravenous  Once 07/26/22 1610 07/28/22 1007   07/26/22 1100  cefTRIAXone (ROCEPHIN) 1 g in sodium chloride 0.9 % 100 mL IVPB  Status:  Discontinued       Note to Pharmacy: Call   1 g 200 mL/hr over 30 Minutes Intravenous Every 24 hours 07/25/22 1403 07/26/22 1610   07/25/22 1200  cefTRIAXone (ROCEPHIN) 1 g in sodium chloride 0.9 % 100 mL IVPB       Note to Pharmacy: Call   1 g 200 mL/hr over 30 Minutes Intravenous  Once 07/24/22 1609 07/26/22 1208      Culture/Microbiology    Component Value Date/Time   SDES BLOOD BLOOD RIGHT HAND 08/01/2022 1238   SPECREQUEST  08/01/2022 1238    BOTTLES DRAWN AEROBIC AND ANAEROBIC Blood Culture adequate volume   CULT  08/01/2022 1238    NO GROWTH < 24 HOURS Performed at [facility], [address] [place], [place] [address]    REPTSTATUS PENDING 08/01/2022 1238  Radiology Studies: DG OR UROLOGY CYSTO IMAGE ([place])  Result Date: 08/02/2022 There is no interpretation for this exam.  This order is for images obtained during a surgical procedure.  Please See "Surgeries" Tab for more information regarding the procedure.   DG Abd Portable 1V-Small Bowel Obstruction Protocol-24 hr delay  Result Date: 08/01/2022 CLINICAL DATA:  Ileus. Small-bowel obstruction. 24 hour delayed film. EXAM: PORTABLE ABDOMEN - 1 VIEW COMPARISON:  Radiograph yesterday. FINDINGS: Enteric contrast is seen within the ascending, descending and rectosigmoid colon. There is persistent gaseous small bowel distension  centrally up to 4.8 cm. Enteric tube remains in place. IMPRESSION: Enteric contrast has reached the ascending, descending and rectosigmoid colon. Persistent gaseous small bowel distension centrally up to 4.8 cm, findings suggest ileus or partial obstruction. Electronically Signed   By: Keith Rake M.D.   On: 08/01/2022 22:36   IR Fluoro Guide CV Line Right  Result Date: 08/01/2022 CLINICAL DATA:  Small-bowel obstruction and need for central venous catheter for parental nutrition. EXAM: NON-TUNNELED CENTRAL VENOUS CATHETER PLACEMENT WITH ULTRASOUND AND FLUOROSCOPIC GUIDANCE FLUOROSCOPY: 12 seconds.  4.6 mGy. PROCEDURE: The procedure, risks, benefits, and alternatives were explained to the patient's sister. Questions regarding the procedure were encouraged and answered. The patient's sister understands and consents to the procedure. A time-out was performed prior to initiating the procedure. The right neck and chest were prepped with chlorhexidine in a sterile fashion, and a sterile drape was applied covering the operative field. Maximum barrier sterile technique with sterile gowns and gloves were used for the procedure. Local anesthesia was provided with 1% lidocaine. Ultrasound was used to confirm patency of the right internal jugular vein. A permanent ultrasound image was recorded and saved. After creating a small venotomy incision, a 21 gauge needle was advanced into the right internal jugular vein under direct, real-time ultrasound guidance. Ultrasound image documentation was performed. After securing guidewire access, venous access was  dilated. A 7 French, triple lumen Arrow central venous catheter was advanced over the wire under fluoroscopy. Final catheter positioning was confirmed and documented with a fluoroscopic spot image. All 3 lumens of the catheter were aspirated and flushed with saline. The catheter exit site was secured with 0-Prolene retention sutures. COMPLICATIONS: None.  No pneumothorax.  FINDINGS: After catheter placement, the tip lies at the cavoatrial junction. The catheter aspirates normally and is ready for immediate use. IMPRESSION: Placement of non-tunneled central venous catheter via the right internal jugular vein. The catheter tip lies at the cavoatrial junction. The catheter is ready for immediate use. Electronically Signed   By: Aletta Edouard M.D.   On: 08/01/2022 14:16   IR US Guide Vasc Access Right  Result Date: 08/01/2022 CLINICAL DATA:  Small-bowel obstruction and need for central venous catheter for parental nutrition. EXAM: NON-TUNNELED CENTRAL VENOUS CATHETER PLACEMENT WITH ULTRASOUND AND FLUOROSCOPIC GUIDANCE FLUOROSCOPY: 12 seconds.  4.6 mGy. PROCEDURE: The procedure, risks, benefits, and alternatives were explained to the patient's sister. Questions regarding the procedure were encouraged and answered. The patient's sister understands and consents to the procedure. A time-out was performed prior to initiating the procedure. The right neck and chest were prepped with chlorhexidine in a sterile fashion, and a sterile drape was applied covering the operative field. Maximum barrier sterile technique with sterile gowns and gloves were used for the procedure. Local anesthesia was provided with 1% lidocaine. Ultrasound was used to confirm patency of the right internal jugular vein. A permanent ultrasound image was recorded and saved. After creating a small venotomy incision, a 21 gauge needle was advanced into the right internal jugular vein under direct, real-time ultrasound guidance. Ultrasound image documentation was performed. After securing guidewire access, venous access was dilated. A 7 French, triple lumen Arrow central venous catheter was advanced over the wire under fluoroscopy. Final catheter positioning was confirmed and documented with a fluoroscopic spot image. All 3 lumens of the catheter were aspirated and flushed with saline. The catheter exit site was secured  with 0-Prolene retention sutures. COMPLICATIONS: None.  No pneumothorax. FINDINGS: After catheter placement, the tip lies at the cavoatrial junction. The catheter aspirates normally and is ready for immediate use. IMPRESSION: Placement of non-tunneled central venous catheter via the right internal jugular vein. The catheter tip lies at the cavoatrial junction. The catheter is ready for immediate use. Electronically Signed   By: Aletta Edouard M.D.   On: 08/01/2022 14:16   DG Abd Portable 1V-Small Bowel Obstruction Protocol-initial, 8 hr delay  Result Date: 07/31/2022 CLINICAL DATA:  Small bowel obstruction, 8 hour delayed film. EXAM: PORTABLE ABDOMEN - 1 VIEW COMPARISON:  Radiograph earlier today. FINDINGS: Enteric tube is in place. There is enteric contrast within dilated small bowel in the central abdomen. Persistent gaseous small bowel distension. There is no contrast in the colon. IMPRESSION: Enteric contrast within dilated small bowel in the central abdomen. Persistent gaseous small bowel distension. No contrast in the colon. Electronically Signed   By: Keith Rake M.D.   On: 07/31/2022 21:28     LOS: 8 days   Antonieta Pert, MD [facility]  08/02/2022, 1:43 PM

## 2022-08-02 NOTE — Transfer of Care (Signed)
Immediate Anesthesia Transfer of Care Note  Patient: Rodney Estrada  Procedure(s) Performed: CYSTOSCOPY/URETEROSCOPY/HOLMIUM LASER/STENT PLACEMENT (Left)  Patient Location: PACU  Anesthesia Type:General  Level of Consciousness: drowsy  Airway & Oxygen Therapy: Patient Spontanous Breathing and Patient connected to face mask oxygen  Post-op Assessment: Report given to RN and Post -op Vital signs reviewed and stable  Post vital signs: Reviewed and stable  Last Vitals:  Vitals Value Taken Time  BP 137/74 08/02/22 1516  Temp    Pulse 69 08/02/22 1520  Resp 14 08/02/22 1520  SpO2 100 % 08/02/22 1520  Vitals shown include unvalidated device data.  Last Pain:  Vitals:   08/02/22 1254  TempSrc: Temporal  PainSc: 0-No pain         Complications: No notable events documented.

## 2022-08-02 NOTE — Interval H&P Note (Signed)
History and Physical Interval Note:  For left ureteral stent placement today.  All questions were answered and he desires to proceed. CV: RRR Lungs: Clear  08/02/2022 1:50 PM  Rodney Estrada  has presented today for surgery, with the diagnosis of Stent Placement.  The various methods of treatment have been discussed with the patient and family. After consideration of risks, benefits and other options for treatment, the patient has consented to  Procedure(s): CYSTOSCOPY/URETEROSCOPY/HOLMIUM LASER/STENT PLACEMENT (Left) as a surgical intervention.  The patient's history has been reviewed, patient examined, no change in status, stable for surgery.  I have reviewed the patient's chart and labs.  Questions were answered to the patient's satisfaction.     [place]

## 2022-08-02 NOTE — Anesthesia Procedure Notes (Signed)
Procedure Name: Intubation Date/Time: 08/02/2022 2:05 PM  Performed by: Lia Foyer, CRNAPre-anesthesia Checklist: Patient identified, Emergency Drugs available, Suction available and Patient being monitored Patient Re-evaluated:Patient Re-evaluated prior to induction Oxygen Delivery Method: Circle system utilized Preoxygenation: Pre-oxygenation with 100% oxygen Induction Type: IV induction and Rapid sequence Laryngoscope Size: McGraph and 4 Grade View: Grade I Tube type: Oral Number of attempts: 1 Airway Equipment and Method: Stylet, Oral airway and Video-laryngoscopy Placement Confirmation: ETT inserted through vocal cords under direct vision, positive ETCO2 and breath sounds checked- equal and bilateral Secured at: 21 cm Tube secured with: Tape Dental Injury: Teeth and Oropharynx as per pre-operative assessment

## 2022-08-03 ENCOUNTER — Encounter: Payer: Self-pay | Admitting: Urology

## 2022-08-03 DIAGNOSIS — K56609 Unspecified intestinal obstruction, unspecified as to partial versus complete obstruction: Secondary | ICD-10-CM | POA: Diagnosis not present

## 2022-08-03 LAB — GLUCOSE, CAPILLARY
Glucose-Capillary: 171 mg/dL — ABNORMAL HIGH (ref 70–99)
Glucose-Capillary: 190 mg/dL — ABNORMAL HIGH (ref 70–99)
Glucose-Capillary: 227 mg/dL — ABNORMAL HIGH (ref 70–99)
Glucose-Capillary: 240 mg/dL — ABNORMAL HIGH (ref 70–99)

## 2022-08-03 LAB — CBC
HCT: 34.9 % — ABNORMAL LOW (ref 39.0–52.0)
Hemoglobin: 11.3 g/dL — ABNORMAL LOW (ref 13.0–17.0)
MCH: 26.8 pg (ref 26.0–34.0)
MCHC: 32.4 g/dL (ref 30.0–36.0)
MCV: 82.9 fL (ref 80.0–100.0)
Platelets: 262 10*3/uL (ref 150–400)
RBC: 4.21 MIL/uL — ABNORMAL LOW (ref 4.22–5.81)
RDW: 15.1 % (ref 11.5–15.5)
WBC: 14.6 10*3/uL — ABNORMAL HIGH (ref 4.0–10.5)
nRBC: 0 % (ref 0.0–0.2)

## 2022-08-03 LAB — COMPREHENSIVE METABOLIC PANEL
ALT: 14 U/L (ref 0–44)
AST: 16 U/L (ref 15–41)
Albumin: 3.2 g/dL — ABNORMAL LOW (ref 3.5–5.0)
Alkaline Phosphatase: 66 U/L (ref 38–126)
Anion gap: 7 (ref 5–15)
BUN: 20 mg/dL (ref 8–23)
CO2: 23 mmol/L (ref 22–32)
Calcium: 8.9 mg/dL (ref 8.9–10.3)
Chloride: 107 mmol/L (ref 98–111)
Creatinine, Ser: 1.47 mg/dL — ABNORMAL HIGH (ref 0.61–1.24)
GFR, Estimated: 51 mL/min — ABNORMAL LOW (ref >60)
Glucose, Bld: 173 mg/dL — ABNORMAL HIGH (ref 70–99)
Potassium: 4 mmol/L (ref 3.5–5.1)
Sodium: 137 mmol/L (ref 135–145)
Total Bilirubin: 0.3 mg/dL (ref 0.3–1.2)
Total Protein: 6.7 g/dL (ref 6.5–8.1)

## 2022-08-03 LAB — MAGNESIUM: Magnesium: 2.1 mg/dL (ref 1.7–2.4)

## 2022-08-03 LAB — TRIGLYCERIDES: Triglycerides: 133 mg/dL (ref <150)

## 2022-08-03 LAB — PHOSPHORUS: Phosphorus: 2.9 mg/dL (ref 2.5–4.6)

## 2022-08-03 MED ORDER — TRAVASOL 10 % IV SOLN
INTRAVENOUS | Status: DC
  Filled 2022-08-03: qty 583.2

## 2022-08-03 MED ORDER — ENSURE ENLIVE PO LIQD
237.0000 mL | Freq: Three times a day (TID) | ORAL | Status: DC
  Administered 2022-08-03 – 2022-08-07 (×10): 237 mL via ORAL

## 2022-08-03 MED ORDER — LABETALOL HCL 100 MG PO TABS
100.0000 mg | ORAL_TABLET | Freq: Three times a day (TID) | ORAL | Status: DC
  Administered 2022-08-03 – 2022-08-07 (×12): 100 mg via ORAL
  Filled 2022-08-03 (×13): qty 1

## 2022-08-03 MED ORDER — TRAVASOL 10 % IV SOLN
INTRAVENOUS | Status: DC
  Filled 2022-08-03: qty 1166.4

## 2022-08-03 MED ORDER — OXYBUTYNIN CHLORIDE ER 5 MG PO TB24
5.0000 mg | ORAL_TABLET | Freq: Every day | ORAL | Status: DC
  Administered 2022-08-03 – 2022-08-06 (×4): 5 mg via ORAL
  Filled 2022-08-03 (×5): qty 1

## 2022-08-03 MED ORDER — ADULT MULTIVITAMIN W/MINERALS CH
1.0000 | ORAL_TABLET | Freq: Every day | ORAL | Status: DC
  Administered 2022-08-04 – 2022-08-07 (×3): 1 via ORAL
  Filled 2022-08-03 (×4): qty 1

## 2022-08-03 MED ORDER — AMLODIPINE BESYLATE 10 MG PO TABS
10.0000 mg | ORAL_TABLET | Freq: Every day | ORAL | Status: DC
  Administered 2022-08-04 – 2022-08-07 (×4): 10 mg via ORAL
  Filled 2022-08-03 (×5): qty 1

## 2022-08-03 NOTE — Progress Notes (Addendum)
34 - received report 2106 - sister called for update 2157 - scheduled medication administration, notified hospitalist regarding c/o pain. See orders 0010 - administered scheduled medication, call bell connected to wall and functioning 0130 - request cup of ice 0232 - resting with eyes closed, no obvious signs of discomfort or distress noted, respirations even and unlabored 0358 - lab at bedside 0558 - scheduled and prn medication administered per physician's order

## 2022-08-03 NOTE — Progress Notes (Addendum)
1910 - received report 2045 - pt c/o complaining of pain.  Prn analgesic administered per physician's order with scheduled hs medications. Leg strap to foley bag adjusted. Educated pt on not manipulating leg strap or foley.  Pt does not nods, but does not answer teach back question.  2133 - IV alarming. Offered pt oral fluids.  Accepted water. No other complaints or needs at this time.  2207 - offered pt water, pt accepted. Refuses ensure 2346 - pt resting in bed with eyes closed, awakened to administer scheduled sliding scale insulin. No needs or complaints reported to this RN at this time.  0140 - pt resting in bed with eyes closed, respirations even and unlabored, no obvious signs of discomfort or distress noted 0450 - pt resting with eyes closed, respirations even, unlabored. No obvious signs of discomfort or stress 0620 - pt resting with eyes closed, awakened for medication administration

## 2022-08-03 NOTE — TOC Progression Note (Signed)
Transition of Care [facility]) - Progression Note    Patient Details  Name: Rodney Estrada MRN: [id number] Date of Birth: 1951-08-22  Transition of Care [facility]) CM/SW Contact  Beverly Sessions, RN Phone Number: 08/03/2022, 3:36 PM  Clinical Narrative:      Per MD anticipated dc 1-2 days.  Ricky at [facility] [employer] updated. He confirms that patients can return on the weekend       Expected Discharge Plan and Services                                                 Social Determinants of Health (SDOH) Interventions    Readmission Risk Interventions     No data to display

## 2022-08-03 NOTE — Progress Notes (Signed)
PROGRESS NOTE Rodney Estrada  XLK:[id number] DOB: [birth year] DOA: 07/23/2022 PCP: Pcp, No   Brief Narrative/Hospital Course: 71 yom w/ hx of CVA with left-sided hemiparesis, hypertension and depression who presented to the emergency room on 10/29 with 1 episode of emesis, found to have partial versus early small bowel obstruction that look to be mild.  Patient also noted to have large calculus with moderate proximal left hydroureteronephrosis.  Patient seen by urology who felt findings were chronic.  Patient seen by general surgery who felt a small bowel obstruction could be managed conservatively.  By 10/30, obstruction felt to have resolved and patient started on clear liquids.  Diet advanced and by 10/31, tolerating soft bland food.  However, starting evening of 10/31, patient started having nausea and vomiting.  Repeat x-ray on 11/1 noted recurrence of small bowel obstruction.  With persistent obstruction, NG tube placed by interventional radiology 11/3.  Urology met with patient on 10/31 about placing stent.  Recommendation is to have it done sooner than later to preserve remaining renal function as long as it is more than 20%.  After some consideration, patient amenable, and urology planning to take patient to OR once SBO resolves.  Patient appears to have persistent small bowel obstruction on x-ray, but was able to have bowel movements.  General surgery is following.  Central line placed and TPN .He had intermittent fever, started on Rocephin on 11/6, urology is still planning for surgery later this week. 11/8: NG tube removed, surgery signed off with instruction to start regular diet after urological procedure   Subjective: Seen and examined this morning.  Patient's sister at bedside. 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  Managed with NG tube decompression supportive care IV fluids.  Seen by surgery, NG tube removed subsequently consulted and surgery signed off and advise diet after urological procedure.  If tolerates diet this afternoon will discontinue TPN advance to soft diet  Acute kidney injury on CKD stage IIIa: Peaked to 2.1.  Creatinine downtrending to 1.4 stable.  Monitor Recent Labs    07/24/22 0457 07/25/22 0454 07/27/22 0529 07/28/22 0441 07/29/22 0432 07/30/22 0400 07/31/22 0444 08/01/22 0634 08/02/22 0608 08/03/22 0359  BUN 53* 42* 28* _0 CREATININE 2.16* 1.88* 1.56* 1.39* 1.33* 1.31* 1.30* 1.87* 1.47* 9.56*  Mild metabolic acidosis: Hyporkalemia Hypernatremia: Electrolytes stable  w/ mild hypokalemia-which is also resolved.   Obstructive nephrolithiasis with hydronephrosis Fever: He has a history of nephrolithiasis with CT imaging notable for large calculus with moderate proximal left hydroureteronephrosis.  Urology feels that this is chronic, but given patient's recurrent fever leukocytosis, urology consulted  s/p stent placement 11/8.No fever since 11/6, leukocytosis resolved. cont ceftriaxone.  Repeat blood culture 11/7 NGTD. Urine cx pending.  WBC count up overnight likely postprocedure, recheck in the morning. Recent Labs  Lab 07/30/22 0400 07/31/22 0444 08/01/22 0634 08/02/22 0608 08/03/22 0359  WBC 9.4 11.7* 16.6* 10.4 14.6*  History of a stroke right MCA status post tPA and mechanical thrombectomy with stent placement: Cont aspirin 81, Plavix 75 and Lipitor 40.  Essential hypertension: PTA  amlodipine/labetalol/Lasix> amlodipine 10 mg.  Resume labetalol in am if still up,monitor  Type 2 diabetes on long-term insulin, PTA on Levemir 18 units twice daily and SSI.  Blood sugars controlled, on SSI 0-15 units Recent Labs  Lab 08/02/22 1516 08/02/22 1707 08/02/22 2333 08/03/22 0540 08/03/22 1141  GLUCAP 131* 167* 214* 171* 227*    Class I Obesity:Patient's Body mass index is 32 kg/m. : Will benefit with PCP follow-up, weight loss  healthy lifestyle and outpatient sleep evaluation.   DVT prophylaxis: lovenox Code Status:   Code Status: Full Code Family Communication: plan of care discussed with patient at bedside. Patient status is: Inpatient because of bowel obstruction  Level of care: Med-Surg  Dispo: The patient is from: SNF            Anticipated disposition: snf 1-2 DAYS.  Sister updated at the bedside.  Objective: Vitals last 24 hrs: Vitals:   08/02/22 1853 08/03/22 0500 08/03/22 0540 08/03/22 0748  BP: (!) 176/77  (!) 196/85 (!) 139/107  Pulse: 80  79 85  Resp: _0 Temp: 98.1 F (36.7 C)  98.2 F (36.8 C) 98.4 F (36.9 C)  TempSrc: Oral  Oral Oral  SpO2:   97% 93%  Weight:  98.3 kg    Height:       Weight change: 4.405 kg  Physical Examination: General exam: AA, at baseline weak,71 older appearing HEENT:Oral mucosa moist, Ear/Nose WNL grossly, dentition normal. Respiratory system: bilaterally clearBS, no use of accessory muscle Cardiovascular system: S1 & S2 +, regular rate. Gastrointestinal system: Abdomen soft, NT, abdomne is full ,BS+ Nervous System:Alert, awake, moving extremities and grossly nonfocal Extremities: LE ankle edema neg, lower extremities warm Skin: No rashes,no icterus. MSK: Normal muscle bulk,tone, power  Foley+  Medications reviewed:  Scheduled Meds:  allopurinol  100 mg Oral Daily   amLODipine  10 mg Oral Daily   ARIPiprazole  2 mg Oral Daily   aspirin  81 mg Oral Daily   atorvastatin  40 mg Oral QPM   Chlorhexidine  Gluconate Cloth  6 each Topical Daily   clopidogrel  75 mg Oral Daily   enoxaparin (LOVENOX) injection  0.5 mg/kg Subcutaneous Q24H   gabapentin  300 mg Oral TID   insulin aspart  0-15 Units Subcutaneous Q6H   pantoprazole  40 mg Oral BID   sertraline  150 mg Oral Daily   sodium chloride flush  3 mL Intravenous Q12H  Continuous Infusions:  cefTRIAXone (ROCEPHIN)  IV 0 g (08/01/22 1631)   promethazine (PHENERGAN) injection (IM or IVPB) Stopped (07/28/22 1511)   TPN ADULT (ION) 90 mL/hr at 08/03/22 0126   TPN ADULT (ION)      Diet Order             Diet full liquid Room service appropriate? Yes; Fluid consistency: Thin  Diet effective now                  Intake/Output Summary (Last 24 hours) at 08/03/2022 1256 Last data filed at 08/03/2022 1035 Gross per 24 hour  Intake 1448.97 ml  Output 1650 ml  Net -201.03 ml   Net IO Since Admission: -195.24 mL [08/03/22 1256]  Wt Readings from Last 3 Encounters:  08/03/22 98.3 kg  04/11/20 99.1 kg    Unresulted Labs (From admission, onward)  Start     Ordered   08/04/22 9892  Basic metabolic panel  Tomorrow morning,   R        08/03/22 1109   08/03/22 0500  Comprehensive metabolic panel  (TPN Lab Panel)  Every Mon,Thu (0500),   TIMED      08/01/22 1229   08/03/22 0500  Magnesium  (TPN Lab Panel)  Every Mon,Thu (0500),   TIMED      08/01/22 1229   08/03/22 0500  Phosphorus  (TPN Lab Panel)  Every Mon,Thu (0500),   TIMED      08/01/22 1229   08/03/22 0500  Triglycerides  (TPN Lab Panel)  Every Mon,Thu (0500),   TIMED      08/01/22 1229   08/03/22 0500  CBC  Daily at 5am,   R      08/02/22 1349   08/02/22 1453  Urine Culture  (Urine Culture)  Once,   R       Comments: Indication is for fever, but that was not a given choice.This specimen is for a culture and sensitivity.   Question Answer Comment  Indication Dysuria   Patient immune status Normal      08/02/22 1505          Data Reviewed: I have personally reviewed  following labs and imaging studies CBC: Recent Labs  Lab 07/30/22 0400 07/31/22 0444 08/01/22 0634 08/02/22 0608 08/03/22 0359  WBC 9.4 11.7* 16.6* 10.4 14.6*  HGB 12.2* 11.3* 12.1* 12.0* 11.3*  HCT 37.8* 34.4* 36.8* 37.6* 34.9*  MCV 84.4 81.7 82.1 83.0 82.9  PLT 266 248 264 256 119   Basic Metabolic Panel: Recent Labs  Lab 07/30/22 0400 07/31/22 0444 08/01/22 0634 08/02/22 0608 08/03/22 0359  NA 146* 141 138 140 137  K 3.7 3.6 3.4* 3.3* 4.0  CL 119* 110 102 105 107  CO2 18* _0 GLUCOSE 142* 153* 169* 192* 173*  BUN _1 CREATININE 1.31* 1.30* 1.87* 1.47* 1.47*  CALCIUM 9.4 9.0 9.0 8.8* 8.9  MG 2.0 1.6* 2.3 2.3 2.1  PHOS  --   --  3.1 3.3 2.9   Recent Results (from the past 240 hour(s))  Surgical PCR screen     Status: None   Collection Time: 07/25/22  4:47 AM   Specimen: Nasal Mucosa; Nasal Swab  Result Value Ref Range Status   MRSA, PCR NEGATIVE NEGATIVE Final   Staphylococcus aureus NEGATIVE NEGATIVE Final    Comment: (NOTE) The Xpert SA Assay (FDA approved for NASAL specimens in patients 38 years of age and older), is one component of a comprehensive surveillance program. It is not intended to diagnose infection nor to guide or monitor treatment. Performed at [facility], [address] [place], [place] [address]   Culture, blood (Routine X 2) w Reflex to ID Panel     Status: None (Preliminary result)   Collection Time: 08/01/22 12:28 PM   Specimen: BLOOD  Result Value Ref Range Status   Specimen Description BLOOD BLOOD LEFT HAND  Final   Special Requests   Final    BOTTLES DRAWN AEROBIC AND ANAEROBIC Blood Culture adequate volume   Culture   Final    NO GROWTH 2 DAYS Performed at [facility], [address] [place], [place] [address]    Report Status PENDING  Incomplete  Culture, blood (Routine X 2) w Reflex to ID Panel     Status: None (Preliminary result)  Collection Time: 08/01/22 12:38 PM   Specimen:  BLOOD  Result Value Ref Range Status   Specimen Description BLOOD BLOOD RIGHT HAND  Final   Special Requests   Final    BOTTLES DRAWN AEROBIC AND ANAEROBIC Blood Culture adequate volume   Culture   Final    NO GROWTH 2 DAYS Performed at [facility], [address] [place], [place] [address]    Report Status PENDING  Incomplete    Antimicrobials: Anti-infectives (From admission, onward)    Start     Dose/Rate Route Frequency Ordered Stop   08/01/22 1430  cefTRIAXone (ROCEPHIN) 2 g in sodium chloride 0.9 % 100 mL IVPB        2 g 200 mL/hr over 30 Minutes Intravenous Every 24 hours 08/01/22 1152     07/31/22 1430  cefTRIAXone (ROCEPHIN) 1 g in sodium chloride 0.9 % 100 mL IVPB  Status:  Discontinued        1 g 200 mL/hr over 30 Minutes Intravenous Every 24 hours 07/31/22 1301 08/01/22 1152   07/27/22 0700  cefTRIAXone (ROCEPHIN) 1 g in sodium chloride 0.9 % 100 mL IVPB  Status:  Discontinued        1 g 200 mL/hr over 30 Minutes Intravenous  Once 07/26/22 1610 07/28/22 1007   07/26/22 1100  cefTRIAXone (ROCEPHIN) 1 g in sodium chloride 0.9 % 100 mL IVPB  Status:  Discontinued       Note to Pharmacy: Call   1 g 200 mL/hr over 30 Minutes Intravenous Every 24 hours 07/25/22 1403 07/26/22 1610   07/25/22 1200  cefTRIAXone (ROCEPHIN) 1 g in sodium chloride 0.9 % 100 mL IVPB       Note to Pharmacy: Call   1 g 200 mL/hr over 30 Minutes Intravenous  Once 07/24/22 1609 07/26/22 1208      Culture/Microbiology    Component Value Date/Time   SDES BLOOD BLOOD RIGHT HAND 08/01/2022 1238   SPECREQUEST  08/01/2022 1238    BOTTLES DRAWN AEROBIC AND ANAEROBIC Blood Culture adequate volume   CULT  08/01/2022 1238    NO GROWTH 2 DAYS Performed at [facility], [address] [place], [place] [address]    REPTSTATUS PENDING 08/01/2022 1238  Radiology Studies: DG OR UROLOGY CYSTO IMAGE ([place])  Result Date: 08/02/2022 There is no interpretation for this exam.  This  order is for images obtained during a surgical procedure.  Please See "Surgeries" Tab for more information regarding the procedure.   DG Abd Portable 1V-Small Bowel Obstruction Protocol-24 hr delay  Result Date: 08/01/2022 CLINICAL DATA:  Ileus. Small-bowel obstruction. 24 hour delayed film. EXAM: PORTABLE ABDOMEN - 1 VIEW COMPARISON:  Radiograph yesterday. FINDINGS: Enteric contrast is seen within the ascending, descending and rectosigmoid colon. There is persistent gaseous small bowel distension centrally up to 4.8 cm. Enteric tube remains in place. IMPRESSION: Enteric contrast has reached the ascending, descending and rectosigmoid colon. Persistent gaseous small bowel distension centrally up to 4.8 cm, findings suggest ileus or partial obstruction. Electronically Signed   By: Keith Rake M.D.   On: 08/01/2022 22:36   IR Fluoro Guide CV Line Right  Result Date: 08/01/2022 CLINICAL DATA:  Small-bowel obstruction and need for central venous catheter for parental nutrition. EXAM: NON-TUNNELED CENTRAL VENOUS CATHETER PLACEMENT WITH ULTRASOUND AND FLUOROSCOPIC GUIDANCE FLUOROSCOPY: 12 seconds.  4.6 mGy. PROCEDURE: The procedure, risks, benefits, and alternatives were explained to the patient's sister. Questions regarding the procedure were encouraged and answered. The patient's  sister understands and consents to the procedure. A time-out was performed prior to initiating the procedure. The right neck and chest were prepped with chlorhexidine in a sterile fashion, and a sterile drape was applied covering the operative field. Maximum barrier sterile technique with sterile gowns and gloves were used for the procedure. Local anesthesia was provided with 1% lidocaine. Ultrasound was used to confirm patency of the right internal jugular vein. A permanent ultrasound image was recorded and saved. After creating a small venotomy incision, a 21 gauge needle was advanced into the right internal jugular vein under  direct, real-time ultrasound guidance. Ultrasound image documentation was performed. After securing guidewire access, venous access was dilated. A 7 French, triple lumen Arrow central venous catheter was advanced over the wire under fluoroscopy. Final catheter positioning was confirmed and documented with a fluoroscopic spot image. All 3 lumens of the catheter were aspirated and flushed with saline. The catheter exit site was secured with 0-Prolene retention sutures. COMPLICATIONS: None.  No pneumothorax. FINDINGS: After catheter placement, the tip lies at the cavoatrial junction. The catheter aspirates normally and is ready for immediate use. IMPRESSION: Placement of non-tunneled central venous catheter via the right internal jugular vein. The catheter tip lies at the cavoatrial junction. The catheter is ready for immediate use. Electronically Signed   By: Aletta Edouard M.D.   On: 08/01/2022 14:16   IR US Guide Vasc Access Right  Result Date: 08/01/2022 CLINICAL DATA:  Small-bowel obstruction and need for central venous catheter for parental nutrition. EXAM: NON-TUNNELED CENTRAL VENOUS CATHETER PLACEMENT WITH ULTRASOUND AND FLUOROSCOPIC GUIDANCE FLUOROSCOPY: 12 seconds.  4.6 mGy. PROCEDURE: The procedure, risks, benefits, and alternatives were explained to the patient's sister. Questions regarding the procedure were encouraged and answered. The patient's sister understands and consents to the procedure. A time-out was performed prior to initiating the procedure. The right neck and chest were prepped with chlorhexidine in a sterile fashion, and a sterile drape was applied covering the operative field. Maximum barrier sterile technique with sterile gowns and gloves were used for the procedure. Local anesthesia was provided with 1% lidocaine. Ultrasound was used to confirm patency of the right internal jugular vein. A permanent ultrasound image was recorded and saved. After creating a small venotomy incision, a  21 gauge needle was advanced into the right internal jugular vein under direct, real-time ultrasound guidance. Ultrasound image documentation was performed. After securing guidewire access, venous access was dilated. A 7 French, triple lumen Arrow central venous catheter was advanced over the wire under fluoroscopy. Final catheter positioning was confirmed and documented with a fluoroscopic spot image. All 3 lumens of the catheter were aspirated and flushed with saline. The catheter exit site was secured with 0-Prolene retention sutures. COMPLICATIONS: None.  No pneumothorax. FINDINGS: After catheter placement, the tip lies at the cavoatrial junction. The catheter aspirates normally and is ready for immediate use. IMPRESSION: Placement of non-tunneled central venous catheter via the right internal jugular vein. The catheter tip lies at the cavoatrial junction. The catheter is ready for immediate use. Electronically Signed   By: Aletta Edouard M.D.   On: 08/01/2022 14:16     LOS: 9 days   Antonieta Pert, MD [facility]  08/03/2022, 12:56 PM

## 2022-08-03 NOTE — Progress Notes (Signed)
Nutrition Follow Up Note   DOCUMENTATION CODES:   Not applicable  INTERVENTION:   TPN per pharmacy- plan is for half rate tonight   Ensure Enlive po TID, each supplement provides 350 kcal and 20 grams of protein.  MVI po daily   NUTRITION DIAGNOSIS:   Inadequate oral intake related to acute illness (SBO) as evidenced by other (comment) (pt on NPO/clear liquid diet).  GOAL:   Patient will meet greater than or equal to 90% of their needs -met with TPN  MONITOR:   PO intake, Supplement acceptance, Diet advancement, Labs, Weight trends, Skin, I & O's, TPN  ASSESSMENT:   71 y/o male with h/o R MCA infarct with L hemiplegia, HTN, DM, L nephrolithiasis with chronic hydroureteronephrosis and renal parenchymal atrophy with evidence of urinary obstruction dating back to July 2021, HLD, etoh abuse, substance abuse and MDD who is admitted with SBO.  -Pt s/p lithotripsy and ureteral stent placement 11/8  Pt tolerating TPN at goal rate. NGT removed and pt placed on a full liquid diet 11/8. Pt with fair appetite and oral intake in hospital. Plan is for diet advancement to soft diet as tolerated. Surgery signed off. Plan is to wean TPN to half rate for tonight and to discontinue tomorrow if patient is able to tolerate soft diet. Would recommend continue TPN until patient is eating at least 75% of meals via oral intake. Refeed labs stable. Per chart, pt is down 9lbs since admission. RD will add supplements and MVI to help pt meet his estimated needs.   Medications reviewed and include: allopurinol, aspirin, plavix, lovenox, insulin, protonix, ceftriaxone  Labs reviewed: K 4.0 wnl, creat 1.47(H), P 2.9 wnl, Mg 2.1 wnl Triglycerides- 133- 11/9 Wbc- 14.6(H) Cbgs- 227, 171 x 24 hrs   Diet Order:   Diet Order             Diet full liquid Room service appropriate? Yes; Fluid consistency: Thin  Diet effective now                  EDUCATION NEEDS:   Education needs have been  addressed  Skin:  Skin Assessment: Reviewed RN Assessment  Last BM:  11/8- type 6  Height:   Ht Readings from Last 1 Encounters:  07/23/22 _0  (1.753 m)    Weight:   Wt Readings from Last 1 Encounters:  08/03/22 98.3 kg    Ideal Body Weight:  72.7 kg  BMI:  Body mass index is 32 kg/m.  Estimated Nutritional Needs:   Kcal:  2100-2400kcal/day  Protein:  105-120g/day  Fluid:  1.9-2.2L/day  Koleen Distance MS, RD, LDN Please refer to [facility] for RD and/or RD on-call/weekend/after hours pager

## 2022-08-03 NOTE — Care Management Important Message (Signed)
Important Message  Patient Details  Name: Rodney Estrada MRN: [id number] Date of Birth: 04/06/1951   Medicare Important Message Given:  Yes     Dannette Barbara 08/03/2022, 1:54 PM

## 2022-08-03 NOTE — Progress Notes (Addendum)
PHARMACY - TOTAL PARENTERAL NUTRITION CONSULT NOTE   Indication: Small bowel obstruction  Patient Measurements: Height: '5\' 9"'$  (175.3 cm) Weight: 98.3 kg (216 lb 11.4 oz) IBW/kg (Calculated) : 70.7 TPN AdjBW (KG): 78.5 Body mass index is 32 kg/m.  Assessment:  70 y.o. male with medical history significant of prior CVA with residual left hemiparesis, hypertension, CAD, depression who presented to the ED with complaints of brown emesis, SBO.   Glucose / Insulin: BG 131 - 214 previous 24h (14 units SSI required) Electrolytes: WNL Renal:  SCr 2.12-->1.39-->1.87-->1.47 (unknown BL) Hepatic: AST/ALT wnl TG: wnl Intake / Output: net negative 195 mL MIVF: none GI Imaging:  11/06: Persistent gaseous SBO on Abd x-ray GI Surgeries / Procedures: no recent   Central access: 08/01/22 TPN start date: 08/01/22  Nutritional Goals: Goal TPN rate is 90 mL/hr (provides 116 g of protein and 2384 kcals per day)  RD Assessment: pending Estimated Needs Total Energy Estimated Needs: 2100-2400kcal/day Total Protein Estimated Needs: 105-120g/day Total Fluid Estimated Needs: 1.9-2.2L/day  Current Nutrition: CLD  Plan:  --decrease TPN rate to 45 mL/hr  --Nutritional components: Amino acids: 58 grams Dextrose 176 grams Lipids: 36 grams kCal 1191 per24h --Electrolytes in TPN (standard): Na 39mq/L, K 549m/L, Ca 56m53mL, Mg 56mE40m, and Phos 156mm39m. Cl:Ac 1:1 --Add standard MVI and trace elements to TPN --continue Sensitive q6h SSI and adjust as needed   Monitor TPN labs daily until stable then Mon/Thurs  RodneDallie Piles/2023,8:09 AM

## 2022-08-04 ENCOUNTER — Other Ambulatory Visit: Payer: Self-pay | Admitting: Physician Assistant

## 2022-08-04 ENCOUNTER — Other Ambulatory Visit (HOSPITAL_COMMUNITY): Payer: Self-pay

## 2022-08-04 DIAGNOSIS — K56609 Unspecified intestinal obstruction, unspecified as to partial versus complete obstruction: Secondary | ICD-10-CM | POA: Diagnosis not present

## 2022-08-04 DIAGNOSIS — N201 Calculus of ureter: Secondary | ICD-10-CM

## 2022-08-04 LAB — BASIC METABOLIC PANEL
Anion gap: 7 (ref 5–15)
BUN: 24 mg/dL — ABNORMAL HIGH (ref 8–23)
CO2: 21 mmol/L — ABNORMAL LOW (ref 22–32)
Calcium: 8.8 mg/dL — ABNORMAL LOW (ref 8.9–10.3)
Chloride: 107 mmol/L (ref 98–111)
Creatinine, Ser: 1.54 mg/dL — ABNORMAL HIGH (ref 0.61–1.24)
GFR, Estimated: 49 mL/min — ABNORMAL LOW (ref >60)
Glucose, Bld: 186 mg/dL — ABNORMAL HIGH (ref 70–99)
Potassium: 3.9 mmol/L (ref 3.5–5.1)
Sodium: 135 mmol/L (ref 135–145)

## 2022-08-04 LAB — GLUCOSE, CAPILLARY
Glucose-Capillary: 187 mg/dL — ABNORMAL HIGH (ref 70–99)
Glucose-Capillary: 261 mg/dL — ABNORMAL HIGH (ref 70–99)
Glucose-Capillary: 146 mg/dL — ABNORMAL HIGH (ref 70–99)

## 2022-08-04 LAB — CBC
HCT: 34.2 % — ABNORMAL LOW (ref 39.0–52.0)
Hemoglobin: 10.7 g/dL — ABNORMAL LOW (ref 13.0–17.0)
MCH: 26.8 pg (ref 26.0–34.0)
MCHC: 31.3 g/dL (ref 30.0–36.0)
MCV: 85.7 fL (ref 80.0–100.0)
Platelets: 238 10*3/uL (ref 150–400)
RBC: 3.99 MIL/uL — ABNORMAL LOW (ref 4.22–5.81)
RDW: 15.6 % — ABNORMAL HIGH (ref 11.5–15.5)
WBC: 10.5 10*3/uL (ref 4.0–10.5)
nRBC: 0 % (ref 0.0–0.2)

## 2022-08-04 LAB — URINE CULTURE
Culture: >=100000 — AB
Special Requests: NORMAL

## 2022-08-04 MED ORDER — PIPERACILLIN-TAZOBACTAM 3.375 G IVPB
3.3750 g | Freq: Three times a day (TID) | INTRAVENOUS | Status: DC
  Administered 2022-08-04: 3.375 g via INTRAVENOUS
  Filled 2022-08-04: qty 50

## 2022-08-04 MED ORDER — SODIUM CHLORIDE 0.9 % IV SOLN
1.0000 g | INTRAVENOUS | Status: DC
  Administered 2022-08-04 – 2022-08-06 (×3): 1000 mg via INTRAVENOUS
  Filled 2022-08-04 (×4): qty 1

## 2022-08-04 MED ORDER — INSULIN ASPART 100 UNIT/ML IJ SOLN
0.0000 [IU] | Freq: Three times a day (TID) | INTRAMUSCULAR | Status: DC
  Administered 2022-08-04: 2 [IU] via SUBCUTANEOUS
  Administered 2022-08-04: 8 [IU] via SUBCUTANEOUS
  Administered 2022-08-05: 2 [IU] via SUBCUTANEOUS
  Administered 2022-08-05: 5 [IU] via SUBCUTANEOUS
  Administered 2022-08-05: 3 [IU] via SUBCUTANEOUS
  Administered 2022-08-06: 2 [IU] via SUBCUTANEOUS
  Administered 2022-08-06 (×2): 3 [IU] via SUBCUTANEOUS
  Administered 2022-08-07: 2 [IU] via SUBCUTANEOUS
  Filled 2022-08-04 (×9): qty 1

## 2022-08-04 NOTE — TOC Benefit Eligibility Note (Addendum)
Patient Teacher, English as a foreign language completed.    The patient is currently admitted and upon discharge could be taking fosfomycin (Monurol) 3 g pack.  The current 21 day co-pay is $0.00.   The patient is insured through [place], [place] Patient Advocate Specialist [place] Patient Advocate Team Direct Number: [phone]  Fax: [phone]

## 2022-08-04 NOTE — Progress Notes (Signed)
PROGRESS NOTE Rodney Estrada  GBT:[id number] DOB: 11-17-1950 DOA: 07/23/2022 PCP: Pcp, No   Brief Narrative/Hospital Course: 89 yom w/ hx of CVA with left-sided hemiparesis, hypertension and depression who presented to the emergency room on 10/29 with 1 episode of emesis, found to have partial versus early small bowel obstruction that look to be mild.  Patient also noted to have large calculus with moderate proximal left hydroureteronephrosis.  Patient seen by urology who felt findings were chronic.  Patient seen by general surgery who felt a small bowel obstruction could be managed conservatively.  By 10/30, obstruction felt to have resolved and patient started on clear liquids.  Diet advanced and by 10/31, tolerating soft bland food.  However, starting evening of 10/31, patient started having nausea and vomiting.  Repeat x-ray on 11/1 noted recurrence of small bowel obstruction.  With persistent obstruction, NG tube placed by interventional radiology 11/3.  Urology met with patient on 10/31 about placing stent.  Recommendation is to have it done sooner than later to preserve remaining renal function as long as it is more than 20%.  After some consideration, patient amenable, and urology planning to take patient to OR once SBO resolves.  Patient appears to have persistent small bowel obstruction on x-ray, but was able to have bowel movements.  General surgery is following.  Central line placed and TPN .He had intermittent fever, started on Rocephin on 11/6, urology is still planning for surgery later this week. 11/8: NG tube removed, surgery signed off.  Underwent cystoscopy with left ureteroscopy with laser lithotripsy ureteral stent placement.  Urine culture sent    Subjective: Seen and examined this morning.  Patient is resting comfortably passing gas no BM today. 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  Managed with NG tube decompression supportive care IV fluids.  Seen by surgery, NG tube removed subsequently consulted and surgery signed off and advise diet after urological procedure.  If tolerates diet this afternoon will discontinue TPN today, continue diet   Acute kidney injury on CKD stage IIIa: Peaked to 2.1.  Creatinine downtrending to 1.4-1.5, team to monitor Recent Labs    07/25/22 0454 07/27/22 0529 07/28/22 0441 07/29/22 0432 07/30/22 0400 07/31/22 0444 08/01/22 0634 08/02/22 0608 08/03/22 0359 08/04/22 0532  BUN 42* 28* _0 24*  CREATININE 1.88* 1.56* 1.39* 1.33* 1.31* 1.30* 1.87* 1.47* 1.47* 2.70*   Mild metabolic acidosis: Hyporkalemia Hypernatremia: Labs appear stable.     Obstructive nephrolithiasis with hydronephrosis Fever GNR in urine cx from 11/8: He has a history of nephrolithiasis with CT imaging notable for large calculus with moderate proximal left hydroureteronephrosis.  Urology feels that this is chronic, but given patient's recurrent fever leukocytosis, urology consulted  s/p stent placement 11/8.No fever since 11/6, leukocytosis resolved. cont ceftriaxone.  Repeat blood culture 11/7 NGTD. Urine  cx w/ GNR follow culture continue current antibiotics for now  Recent Labs  Lab 07/31/22 0444  08/01/22 0634 08/02/22 0608 08/03/22 0359 08/04/22 0532  WBC 11.7* 16.6* 10.4 14.6* 10.5     History of a stroke right MCA status post tPA and mechanical thrombectomy with stent  placement: Cont aspirin 81, Plavix 75 and Lipitor 40.  Essential hypertension: PTA amlodipine/labetalol/Lasix> BP controlled on amlodipine 10 mg, labetalol  100 mg tid   Type 2 diabetes on long-term insulin, PTA on Levemir 18 units twice daily and SSI.  Blood sugars controlled, on SSI 0-15 units. Monitor. Recent Labs  Lab 08/03/22 0540 08/03/22 1141 08/03/22 1702 08/03/22 2330 08/04/22 0605  GLUCAP 171* 227* 190* 240* 187*   Class I Obesity:Patient's Body mass index is 32 kg/m. : Will benefit with PCP follow-up, weight loss  healthy lifestyle and outpatient sleep evaluation.  DVT prophylaxis: lovenox Code Status:   Code Status: Full Code Family Communication: plan of care discussed with patient at bedside. Patient status is: Inpatient because of bowel obstruction  Level of care: Med-Surg  Dispo: The patient is from: SNF            Anticipated disposition: snf tomorrow once culture resulted  Objective: Vitals last 24 hrs: Vitals:   08/03/22 0748 08/03/22 1614 08/03/22 2035 08/04/22 0359  BP: (!) 139/107 139/79 135/87 100/67  Pulse: 85 80 84 70  Resp: _0 Temp: 98.4 F (36.9 C) 98.7 F (37.1 C) 98.4 F (36.9 C) 98.2 F (36.8 C)  TempSrc: Oral Oral Oral Oral  SpO2: 93% 100% 100% 99%  Weight:      Height:       Weight change:   Physical Examination: General exam: AA oriented, weak,older appearing HEENT:Oral mucosa moist, Ear/Nose WNL grossly, dentition normal. Respiratory system: bilaterally c;ear BS, no use of accessory muscle Cardiovascular system: S1 & S2 +, regular rate. Gastrointestinal system: Abdomen soft, mildly distended, NT,BS+ Nervous System:Alert, awake, left-sided weakness from previous stroke  Extremities: LE ankle edema neg, lower extremities warm Skin: No rashes,no icterus. MSK: Normal muscle bulk,tone, power  Foley+  Medications reviewed:  Scheduled Meds:  allopurinol  100 mg Oral Daily   amLODipine  10 mg Oral Daily   ARIPiprazole   2 mg Oral Daily   aspirin  81 mg Oral Daily   atorvastatin  40 mg Oral QPM   Chlorhexidine Gluconate Cloth  6 each Topical Daily   clopidogrel  75 mg Oral Daily   enoxaparin (LOVENOX) injection  0.5 mg/kg Subcutaneous Q24H   feeding supplement  237 mL Oral TID BM   gabapentin  300 mg Oral TID   insulin aspart  0-15 Units Subcutaneous Q6H   labetalol  100 mg Oral TID   multivitamin with minerals  1 tablet Oral Daily   oxybutynin  5 mg Oral QHS   pantoprazole  40 mg Oral BID   sertraline  150 mg Oral Daily   sodium chloride flush  3 mL Intravenous Q12H  Continuous Infusions:  cefTRIAXone (ROCEPHIN)  IV Stopped (08/03/22 1607)   promethazine (PHENERGAN) injection (IM or IVPB) Stopped (07/28/22 1511)   TPN ADULT (ION) 45 mL/hr at 08/04/22 0250    Diet Order             Diet full liquid Room service appropriate? Yes; Fluid consistency: Thin  Diet effective now                  Intake/Output Summary (Last 24 hours) at 08/04/2022 0802 Last data filed at 08/04/2022 0250 Gross per 24 hour  Intake 2036.18 ml  Output 1950 ml  Net 86.18 ml    Net IO Since Admission: 490.94 mL [08/04/22 0802]  Wt Readings from Last 3 Encounters:  08/03/22 98.3 kg  04/11/20 99.1 kg    Unresulted Labs (From admission, onward)     Start     Ordered   08/03/22 0500  Comprehensive metabolic panel  (TPN Lab Panel)  Every Mon,Thu (0500),   TIMED      08/01/22 1229   08/03/22 0500  Magnesium  (TPN Lab Panel)  Every Mon,Thu (0500),   TIMED      08/01/22 1229   08/03/22 0500  Phosphorus  (TPN Lab Panel)  Every Mon,Thu (0500),   TIMED      08/01/22 1229   08/03/22 0500  Triglycerides  (TPN Lab Panel)  Every Mon,Thu (0500),   TIMED      08/01/22 1229   08/03/22 0500  CBC  Daily at 5am,   R      08/02/22 1349          Data Reviewed: I have personally reviewed following labs and imaging studies CBC: Recent Labs  Lab 07/31/22 0444 08/01/22 0634 08/02/22 0608 08/03/22 0359 08/04/22 0532  WBC  11.7* 16.6* 10.4 14.6* 10.5  HGB 11.3* 12.1* 12.0* 11.3* 10.7*  HCT 34.4* 36.8* 37.6* 34.9* 34.2*  MCV 81.7 82.1 83.0 82.9 85.7  PLT 248 264 256 262 326    Basic Metabolic Panel: Recent Labs  Lab 07/30/22 0400 07/31/22 0444 08/01/22 0634 08/02/22 0608 08/03/22 0359 08/04/22 0532  NA 146* 141 138 140 137 135  K 3.7 3.6 3.4* 3.3* 4.0 3.9  CL 119* 110 102 105 107 107  CO2 18* _0 21*  GLUCOSE 142* 153* 169* 192* 173* 186*  BUN _1 24*  CREATININE 1.31* 1.30* 1.87* 1.47* 1.47* 1.54*  CALCIUM 9.4 9.0 9.0 8.8* 8.9 8.8*  MG 2.0 1.6* 2.3 2.3 2.1  --   PHOS  --   --  3.1 3.3 2.9  --     Recent Results (from the past 240 hour(s))  Culture, blood (Routine X 2) w Reflex to ID Panel     Status: None (Preliminary result)   Collection Time: 08/01/22 12:28 PM   Specimen: BLOOD  Result Value Ref Range Status   Specimen Description BLOOD BLOOD LEFT HAND  Final   Special Requests   Final    BOTTLES DRAWN AEROBIC AND ANAEROBIC Blood Culture adequate volume   Culture   Final    NO GROWTH 3 DAYS Performed at [facility], [address] [place], [place] [address]    Report Status PENDING  Incomplete  Culture, blood (Routine X 2) w Reflex to ID Panel     Status: None (Preliminary result)   Collection Time: 08/01/22 12:38 PM   Specimen: BLOOD  Result Value Ref Range Status   Specimen Description BLOOD BLOOD RIGHT HAND  Final   Special Requests   Final    BOTTLES DRAWN AEROBIC AND ANAEROBIC Blood Culture adequate volume   Culture   Final    NO GROWTH 3 DAYS Performed at [facility], [address] [place], [place] [address]    Report Status PENDING  Incomplete  Urine Culture     Status: Abnormal (Preliminary result)   Collection Time: 08/02/22  2:53 PM   Specimen: Urine, Catheterized  Result Value Ref Range Status   Specimen Description   Final    URINE, CATHETERIZED Performed at  [facility], [address], [place], [place]  [address]    Special Requests   Final    Normal Performed at [facility], [address] [place], [place] [address]    Culture (A)  Final    >=100,000 COLONIES/mL GRAM NEGATIVE RODS IDENTIFICATION AND SUSCEPTIBILITIES TO FOLLOW Performed at [facility], [place] [address], [place], [place] [address]    Report Status PENDING  Incomplete    Antimicrobials: Anti-infectives (From admission, onward)    Start     Dose/Rate Route Frequency Ordered Stop   08/01/22 1430  cefTRIAXone (ROCEPHIN) 2 g in sodium chloride 0.9 % 100 mL IVPB        2 g 200 mL/hr over 30 Minutes Intravenous Every 24 hours 08/01/22 1152     07/31/22 1430  cefTRIAXone (ROCEPHIN) 1 g in sodium chloride 0.9 % 100 mL IVPB  Status:  Discontinued        1 g 200 mL/hr over 30 Minutes Intravenous Every 24 hours 07/31/22 1301 08/01/22 1152   07/27/22 0700  cefTRIAXone (ROCEPHIN) 1 g in sodium chloride 0.9 % 100 mL IVPB  Status:  Discontinued        1 g 200 mL/hr over 30 Minutes Intravenous  Once 07/26/22 1610 07/28/22 1007   07/26/22 1100  cefTRIAXone (ROCEPHIN) 1 g in sodium chloride 0.9 % 100 mL IVPB  Status:  Discontinued       Note to Pharmacy: Call   1 g 200 mL/hr over 30 Minutes Intravenous Every 24 hours 07/25/22 1403 07/26/22 1610   07/25/22 1200  cefTRIAXone (ROCEPHIN) 1 g in sodium chloride 0.9 % 100 mL IVPB       Note to Pharmacy: Call   1 g 200 mL/hr over 30 Minutes Intravenous  Once 07/24/22 1609 07/26/22 1208      Culture/Microbiology    Component Value Date/Time   SDES  08/02/2022 1453    URINE, CATHETERIZED Performed at [facility], [address] [place], [place] [address]    [facility]  08/02/2022 1453    Normal Performed at [facility], [address] [place], [place] [address]    CULT (A) 08/02/2022 1453    >=100,000 COLONIES/mL GRAM NEGATIVE RODS IDENTIFICATION AND SUSCEPTIBILITIES TO FOLLOW Performed at [facility], [place] [address],  [place], [place] [address]    REPTSTATUS PENDING 08/02/2022 1453  Radiology Studies: DG OR UROLOGY CYSTO IMAGE [facility] ONLY)  Result Date: 08/02/2022 There is no interpretation for this exam.  This order is for images obtained during a surgical procedure.  Please See "Surgeries" Tab for more information regarding the procedure.     LOS: 10 days   Antonieta Pert, MD [facility]  08/04/2022, 8:02 AM

## 2022-08-04 NOTE — Progress Notes (Addendum)
Brief ID note:  Asked by Dr Lupita Leash to review chart.  Patient s/p cystoscopy with left retrograde pyelogram, ureteroscopy with laser lithotripsy and left ureteral stent placement 08/02/22 with urology.  Planning for subsequent completion of laser lithotripsy/stone removal in next few weeks by urology.  Urine cx from 08/02/22 growing ESBL E coli.  Patient improved on coverage that would not be adequate but he had been febrile prior to stent placement.  Plan: Change to ertapenem 1 gm daily Place midline At discharge to SNF, switch to ertapenem 1gm daily Plan to treat for 10 days given complicated UTI with fever ending 08/14/22 Would then transition to suppressive fosfomycin 3gm every 3 days for suppression until he has definitive stone procedure with urology. See OPAT note below  Diagnosis: UTI  Culture Result: ESBL E coli  No Known Allergies  OPAT Orders Discharge antibiotics to be given via PICC line Discharge antibiotics: Per pharmacy protocol  Ertapenem 1 gm daily  Duration: 10 days End Date: 08/14/22  Midline Care Per Protocol:  Home health RN for IV administration and teaching; Midline care and labs.    Labs weekly while on IV antibiotics: __xx CBC with differential _xx_ BMP  _xx_ Please pull Midline at completion of IV antibiotics   Fax weekly labs to [phone]  Clinic Follow Up Appt: Not needed    Raynelle Highland for Infectious Disease [facility] 08/04/2022, 2:41 PM

## 2022-08-04 NOTE — Progress Notes (Signed)
Left IJ triple lumen removed. Pressure applied for 15 min.  Vaseline gauze, gaze and Tegaderm applied. No complications.

## 2022-08-04 NOTE — Progress Notes (Addendum)
PHARMACY CONSULT NOTE FOR:  OUTPATIENT  PARENTERAL ANTIBIOTIC THERAPY (OPAT)  Informational only as plan is for the patient to discharge back to SNF and receive antibiotics there  Indication: ESBL UTI Regimen: Ertapenem 1g IV every 24 hours End date: 08/14/22  After completion of Ertapenem - the patient will need to transition to Fosfomycin 3g po every 3 days until he proceeds with his urology procedure  IV antibiotic discharge orders are pended. To discharging provider:  please sign these orders via discharge navigator,  Select New Orders & click on the button choice - Manage This Unsigned Work.     Thank you for allowing pharmacy to be a part of this patient's care.  Alycia Rossetti, PharmD, BCPS Infectious Diseases Clinical Pharmacist 08/04/2022 2:51 PM   **Pharmacist phone directory can now be found on [email or web address] ([email or web address]).  Listed under [place].

## 2022-08-04 NOTE — TOC Progression Note (Signed)
Transition of Care [facility]) - Progression Note    Patient Details  Name: Rodney Estrada MRN: [id number] Date of Birth: 1951-08-31  Transition of Care [facility]) CM/SW [place], LCSW Phone Number: 08/04/2022, 3:00 PM  Clinical Narrative:  Per pharmacy, plan to discharge on IV abx through 11/20. Left voicemail for SNF admissions coordinator to notify. Will start auth once he calls back and confirms they can do that.   Expected Discharge Plan and Services                                                 Social Determinants of Health (SDOH) Interventions    Readmission Risk Interventions     No data to display

## 2022-08-04 NOTE — Progress Notes (Signed)
Surgical Physician Order Form [facility] [place]  Dr. Bernardo Heater * Scheduling expectation :  3-4 weeks  *Length of Case:   *Clearance needed: no  *Anticoagulation Instructions: N/A  *Aspirin Instructions: Ok to continue all  *Post-op visit Date/Instructions:   TBD  *Diagnosis: Left Ureteral Stone  *Procedure: left  Ureteroscopy w/laser lithotripsy & stent exchange (00634)   Additional orders: N/A  -Admit type: OUTpatient  -Anesthesia: General  -VTE Prophylaxis Standing Order SCD's       Other:   -Standing Lab Orders Per Anesthesia    Lab other:  per anesthesia, possible repeat UA/culture depending on timing  -Standing Test orders EKG/Chest x-ray per Anesthesia       Test other:   - Medications:  Gentamicin per pharmacy  -Other orders:  N/A

## 2022-08-04 NOTE — Progress Notes (Signed)
IVT note:  Spoke with primary care nurse Gilmore Laroche, stated pt positive for esbl today.Not confirmed discharge for tomorrow. Per SW note, still awaiting approval from  snf. Patient is restricted on L arm dt stroke.  Currently has a working piv access on R arm.

## 2022-08-05 LAB — CBC
HCT: 32 % — ABNORMAL LOW (ref 39.0–52.0)
Hemoglobin: 10.2 g/dL — ABNORMAL LOW (ref 13.0–17.0)
MCH: 26.6 pg (ref 26.0–34.0)
MCHC: 31.9 g/dL (ref 30.0–36.0)
MCV: 83.6 fL (ref 80.0–100.0)
Platelets: 254 10*3/uL (ref 150–400)
RBC: 3.83 MIL/uL — ABNORMAL LOW (ref 4.22–5.81)
RDW: 15.8 % — ABNORMAL HIGH (ref 11.5–15.5)
WBC: 11.5 10*3/uL — ABNORMAL HIGH (ref 4.0–10.5)
nRBC: 0 % (ref 0.0–0.2)

## 2022-08-05 LAB — GLUCOSE, CAPILLARY
Glucose-Capillary: 132 mg/dL — ABNORMAL HIGH (ref 70–99)
Glucose-Capillary: 210 mg/dL — ABNORMAL HIGH (ref 70–99)
Glucose-Capillary: 154 mg/dL — ABNORMAL HIGH (ref 70–99)

## 2022-08-05 MED ORDER — INSULIN ASPART 100 UNIT/ML IJ SOLN: 0.0000 [IU] | Freq: Three times a day (TID) | INTRAMUSCULAR | 11 refills | Status: DC

## 2022-08-05 MED ORDER — ERTAPENEM IV (FOR PTA / DISCHARGE USE ONLY): 1.0000 g | INTRAVENOUS | 0 refills | Status: AC

## 2022-08-05 MED ORDER — PANTOPRAZOLE SODIUM 40 MG PO TBEC: 40.0000 mg | DELAYED_RELEASE_TABLET | Freq: Two times a day (BID) | ORAL | Status: DC

## 2022-08-05 MED ORDER — FOSFOMYCIN TROMETHAMINE 3 G PO PACK: 3.0000 g | PACK | ORAL | 0 refills | Status: AC

## 2022-08-05 MED ORDER — ATORVASTATIN CALCIUM 40 MG PO TABS: 40.0000 mg | ORAL_TABLET | Freq: Every evening | ORAL | Status: DC

## 2022-08-05 MED ORDER — POLYETHYLENE GLYCOL 3350 17 G PO PACK: 17.0000 g | PACK | Freq: Every day | ORAL | 0 refills | Status: DC | PRN

## 2022-08-05 MED ORDER — OXYBUTYNIN CHLORIDE ER 5 MG PO TB24: 5.0000 mg | ORAL_TABLET | Freq: Every day | ORAL | Status: DC

## 2022-08-05 NOTE — Progress Notes (Signed)
Assisted pt with meal, gave required meds, pt reports pain level is a 6

## 2022-08-05 NOTE — TOC Progression Note (Signed)
Transition of Care [facility]) - Progression Note    Patient Details  Name: Rodney Estrada MRN: [id number] Date of Birth: [birth year]  Transition of Care [facility]) CM/SW [place], [place] Phone Number: 08/05/2022, 1:04 PM  Clinical Narrative:     CSW spoke with Charge RN Tasia at [facility], requested an update on whether or not IV antibiotics would be available for pt, she states they don't have the antibiotic on site today. She states the antibiotic would have to be ordered by the AD on Monday, she could not confirm that they would receive the antibiotic on Monday. TOC will follow up with SNF on Monday to confirm. RN made aware.       Expected Discharge Plan and Services           Expected Discharge Date: 08/05/22                                     Social Determinants of Health (SDOH) Interventions    Readmission Risk Interventions     No data to display

## 2022-08-05 NOTE — Discharge Summary (Addendum)
Physician Discharge Summary  Rodney Estrada IWP:[id number] DOB: 04-Aug-1951 DOA: 07/23/2022  PCP: Pcp, No  Admit date: 07/23/2022 Discharge date: 08/07/2022  Admitted From: Home Disposition:  SNF  Recommendations for Outpatient Follow-up:  Follow up with PCP in 1-2 weeks Please obtain BMP/CBC in one week your next doctors visit.  Midline in place for Ertapenem 1g daily until 08/14/22. Thereafter will need fosfomycin 3g q3days for suppression until definitve stone removal procedure by Urology. OPAT ordered by ID.    Discharge Condition: Stable CODE STATUS: Full  Diet recommendation: Diabetic.   Brief/Interim Summary: 71 yom w/ hx of CVA with left-sided hemiparesis, hypertension and depression who presented to the emergency room on 10/29 with 1 episode of emesis, found to have partial versus early small bowel obstruction that look to be mild.  Patient also noted to have large calculus with moderate proximal left hydroureteronephrosis.  Patient seen by urology who felt findings were chronic.  Patient seen by general surgery who felt a small bowel obstruction could be managed conservatively.  By 10/30, obstruction felt to have resolved and patient started on clear liquids.  Diet advanced and by 10/31, tolerating soft bland food.  However, starting evening of 10/31, patient started having nausea and vomiting.  Repeat x-ray on 11/1 noted recurrence of small bowel obstruction.  With persistent obstruction, NG tube placed by interventional radiology 11/3.   Urology met with patient on 10/31 about placing stent.  Recommendation is to have it done sooner than later to preserve remaining renal function as long as it is more than 20%.  After some consideration, patient amenable, and urology planning to take patient to OR once SBO resolves.   Patient appears to have persistent small bowel obstruction on x-ray, but was able to have bowel movements.  General surgery is following.  Central line placed and TPN  .He had intermittent fever, started on Rocephin on 11/6, urology is still planning for surgery later this week. 11/8: NG tube removed, surgery signed off.  Underwent cystoscopy with left ureteroscopy with laser lithotripsy ureteral stent placement.  Urine culture sent.   Cultures eventually grew ESBL E. coli and therefore previous provider discussed case with infectious disease who recommended ertapenem until 11/20 thereafter fosfomycin every third day until definitive removal of renal stones.  Medically stable for discharge today.   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  Creatinine at baseline of 1.5  Mild metabolic acidosis: Hyporkalemia Hypernatremia: Labs appear stable.     Obstructive nephrolithiasis with hydronephrosis Fever GNR in urine cx from 11/8: Initially had proximal calculus.  Seen by urology and underwent lithotripsy with stent placement.  Cultures grew ESBL with plans for ertapenem until 11/20 thereafter chronic suppression until definitive/complete stone removal by patient.  In the meantime ertapenem treatment patient will need fosfomycin for suppressive treatment   History of a stroke right MCA status post tPA and mechanical thrombectomy with stent placement: Cont aspirin 81, Plavix 75 and Lipitor 40.   Essential hypertension: PTA amlodipine/labetalol/Lasix> BP controlled on amlodipine 10 mg,  labetalol  100 mg tid    Type 2 diabetes on long-term insulin- not on long-acting medication currently on sliding scale and  Accu-Cheks  Class I Obesity:Patient's Body mass index is 32 kg/m. : Will benefit with PCP follow-up, weight loss  healthy lifestyle and outpatient sleep evaluation.       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   Essential hypertension   Type 2 diabetes mellitus ([place])   Nephrolithiasis   Obesity   Hypokalemia   Hiccups   Hypernatremia      Consultations: Infectious disease Surgery  Subjective: Feeling well no new complaints  Discharge Exam: Vitals:   08/07/22 0600 08/07/22 0750  BP: 122/69 (!) 120/97  Pulse: 61 (!) 57  Resp: 16 14  Temp: 97.6 F (36.4 C) 97.8 F (36.6 C)  SpO2: 97% 100%   Vitals:   08/06/22 1546 08/06/22 2137 08/07/22 0600 08/07/22 0750  BP: 117/75 109/69 122/69 (!) 120/97  Pulse: 62 65 61 (!) 57  Resp:  _0 Temp: 97.8 F (36.6 C) 98.3 F (36.8 C) 97.6 F (36.4 C) 97.8 F (36.6 C)  TempSrc:      SpO2: 98% 98% 97% 100%  Weight:      Height:        General: Pt is alert, awake, not in acute distress. Weak, frail Cardiovascular: RRR, S1/S2 +, no rubs, no gallops Respiratory: CTA bilaterally, no wheezing, no rhonchi Abdominal: Soft, NT, ND, bowel sounds + Extremities: no edema, no cyanosis  Discharge Instructions  Discharge Instructions     [facility] pharmacist to adjust dose for Vancomycin, Aminoglycosides and other anti-infective therapies as requested by physician.   Complete by: As directed    [facility] to provide Cath Flo 52m   Complete by: As directed    Administer for PICC line occlusion and as ordered by physician for other access device issues.   Anaphylaxis Kit: Provided to treat any anaphylactic reaction to the medication being provided to the patient if First  Dose or when requested by physician   Complete by: As directed    Epinephrine 1669mml vial / amp: Administer 0.69m48m0.69ml100mubcutaneously once for moderate to severe anaphylaxis, nurse to call physician and pharmacy when reaction occurs and call 911 if needed for immediate care   Diphenhydramine 50mg22mIV vial: Administer 25-50mg 28mM PRN for first dose reaction, rash, itching, mild reaction, nurse to call physician and pharmacy when reaction occurs   Sodium Chloride 0.9% NS 500ml I84mdminister if needed for hypovolemic blood pressure drop or as ordered by physician after call to physician with anaphylactic reaction   Change dressing on IV access line weekly and PRN   Complete by: As directed    Flush IV access with Sodium Chloride 0.9% and Heparin 10 units/ml or 100 units/ml   Complete by: As directed    Home infusion instructions - [facility]   Complete by: As directed    Instructions: Flush IV access with Sodium Chloride 0.9% and Heparin 10units/ml or 100units/ml   Change dressing on IV access line: Weekly and PRN   Instructions Cath Flo 2mg: Ad11mister for PICC Line occlusion and as ordered by physician for other access device   [facility] pharmacist to  adjust dose for: Vancomycin, Aminoglycosides and other anti-infective therapies as requested by physician   Method of administration may be changed at the discretion of home infusion pharmacist based upon assessment of the patient and/or caregiver's ability to self-administer the medication ordered   Complete by: As directed       Allergies as of 08/07/2022   No Known Allergies      Medication List     STOP taking these medications    insulin detemir 100 UNIT/ML injection Commonly known as: LEVEMIR   Lantus SoloStar 100 UNIT/ML Solostar Pen Generic drug: insulin glargine       TAKE these medications    allopurinol 100 MG tablet Commonly known as: ZYLOPRIM Take 100 mg by mouth daily.    amLODipine 10 MG tablet Commonly known as: NORVASC Take 10 mg by mouth daily. What changed: Another medication with the same name was removed. Continue taking this medication, and follow the directions you see here.   Anti-Itch lotion Generic drug: camphor-menthol Apply topically.   ARIPiprazole 2 MG tablet Commonly known as: ABILIFY Take 2 mg by mouth daily.   aspirin 81 MG chewable tablet Chew 1 tablet (81 mg total) by mouth daily.   atorvastatin 40 MG tablet Commonly known as: LIPITOR Take 1 tablet (40 mg total) by mouth every evening. What changed:  medication strength how much to take when to take this   carboxymethylcellulose 0.5 % Soln Commonly known as: REFRESH PLUS Apply to eye.   clopidogrel 75 MG tablet Commonly known as: PLAVIX Take 1 tablet (75 mg total) by mouth daily.   diphenhydrAMINE 25 mg capsule Commonly known as: BENADRYL Take 1 capsule (25 mg total) by mouth every 6 (six) hours as needed for itching.   ertapenem  IVPB Commonly known as: INVANZ Inject 1 g into the vein daily for 10 days. Indication:  ESBL UTI First Dose: Yes Last Day of Therapy:  08/14/22 Labs - Once weekly:  CBC/D and BMP, Labs - Every other week:  ESR and CRP Method of administration: Mini-Bag Plus / Gravity Pull midline at the completion of IV therapy Method of administration may be changed at the discretion of home infusion pharmacist based upon assessment of the patient and/or caregiver's ability to self-administer the medication ordered.   fosfomycin 3 g Pack Commonly known as: MONUROL Take 3 g by mouth every 3 (three) days for 6 doses. Start taking on: August 15, 2022   furosemide 20 MG tablet Commonly known as: LASIX Take 20 mg by mouth daily.   gabapentin 300 MG capsule Commonly known as: NEURONTIN Take 300 mg by mouth 3 (three) times daily.   Hibiclens 4 % external liquid Generic drug: chlorhexidine Apply topically.   insulin aspart 100 UNIT/ML  injection Commonly known as: novoLOG Inject 0-15 Units into the skin 3 (three) times daily with meals. insulin aspart (novoLOG) injection 0-15 Units CBG < 70: Implement Hypoglycemia Standing Orders and refer to Hypoglycemia Standing Orders sidebar report CBG 70 - 120: 0 units CBG 121 - 150: 2 units CBG 151 - 200: 3 units CBG 201 - 250: 5 units CBG 251 - 300: 8 units CBG 301 - 350: 11 units CBG 351 - 400: 15 units CBG > 400: call MD   labetalol 100 MG tablet Commonly known as: NORMODYNE Take 1 tablet (100 mg total) by mouth 3 (three) times daily.   lisinopril 40 MG tablet Commonly known as: ZESTRIL Take 40 mg by mouth daily.   oxybutynin 5 MG  24 hr tablet Commonly known as: DITROPAN-XL Take 1 tablet (5 mg total) by mouth at bedtime.   pantoprazole 40 MG tablet Commonly known as: PROTONIX Take 1 tablet (40 mg total) by mouth 2 (two) times daily. What changed: when to take this   polyethylene glycol 17 g packet Commonly known as: MIRALAX / GLYCOLAX Take 17 g by mouth daily as needed for mild constipation.   Resource ThickenUp Clear Powd Take 120 g by mouth as needed (honey thick liquids).   sertraline 50 MG tablet Commonly known as: ZOLOFT Take 150 mg by mouth daily.   Systane Balance 0.6 % Soln Generic drug: Propylene Glycol Apply to eye.   Vitamin D (Ergocalciferol) 1.25 MG (50000 UNIT) Caps capsule Commonly known as: DRISDOL Take 50,000 Units by mouth every 7 (seven) days. Mondays               Discharge Care Instructions  (From admission, onward)           Start     Ordered   08/05/22 0000  Change dressing on IV access line weekly and PRN  (Home infusion instructions - [facility] )        08/05/22 1024            Contact information for after-discharge care     Destination     [facility] .   Service: Skilled Nursing Contact information: [address] [place] [phone]                    No Known Allergies  You were cared for by a hospitalist during your hospital stay. If you have any questions about your discharge medications or the care you received while you were in the hospital after you are discharged, you can call the unit and asked to speak with the hospitalist on call if the hospitalist that took care of you is not available. Once you are discharged, your primary care physician will handle any further medical issues. Please note that no refills for any discharge medications will be authorized once you are discharged, as it is imperative that you return to your primary care physician (or establish a relationship with a primary care physician if you do not have one) for your aftercare needs so that they can reassess your need for medications and monitor your lab values.   Procedures/Studies: DG OR UROLOGY CYSTO IMAGE ([facility] ONLY)  Result Date: 08/02/2022 There is no interpretation for this exam.  This order is for images obtained during a surgical procedure.  Please See "Surgeries" Tab for more information regarding the procedure.   DG Abd Portable 1V-Small Bowel Obstruction Protocol-24 hr delay  Result Date: 08/01/2022 CLINICAL DATA:  Ileus. Small-bowel obstruction. 24 hour delayed film. EXAM: PORTABLE ABDOMEN - 1 VIEW COMPARISON:  Radiograph yesterday. FINDINGS: Enteric contrast is seen within the ascending, descending and rectosigmoid colon. There is persistent gaseous small bowel distension centrally up to 4.8 cm. Enteric tube remains in place. IMPRESSION: Enteric contrast has reached the ascending, descending and rectosigmoid colon. Persistent gaseous small bowel distension centrally up to 4.8 cm, findings suggest ileus or partial obstruction. Electronically Signed   By: Keith Rake M.D.   On: 08/01/2022 22:36   IR Fluoro Guide CV Line Right  Result Date: 08/01/2022 CLINICAL DATA:  Small-bowel obstruction and need for  central venous catheter for parental nutrition. EXAM: NON-TUNNELED CENTRAL VENOUS CATHETER PLACEMENT WITH ULTRASOUND AND FLUOROSCOPIC  GUIDANCE FLUOROSCOPY: 12 seconds.  4.6 mGy. PROCEDURE: The procedure, risks, benefits, and alternatives were explained to the patient's sister. Questions regarding the procedure were encouraged and answered. The patient's sister understands and consents to the procedure. A time-out was performed prior to initiating the procedure. The right neck and chest were prepped with chlorhexidine in a sterile fashion, and a sterile drape was applied covering the operative field. Maximum barrier sterile technique with sterile gowns and gloves were used for the procedure. Local anesthesia was provided with 1% lidocaine. Ultrasound was used to confirm patency of the right internal jugular vein. A permanent ultrasound image was recorded and saved. After creating a small venotomy incision, a 21 gauge needle was advanced into the right internal jugular vein under direct, real-time ultrasound guidance. Ultrasound image documentation was performed. After securing guidewire access, venous access was dilated. A 7 French, triple lumen Arrow central venous catheter was advanced over the wire under fluoroscopy. Final catheter positioning was confirmed and documented with a fluoroscopic spot image. All 3 lumens of the catheter were aspirated and flushed with saline. The catheter exit site was secured with 0-Prolene retention sutures. COMPLICATIONS: None.  No pneumothorax. FINDINGS: After catheter placement, the tip lies at the cavoatrial junction. The catheter aspirates normally and is ready for immediate use. IMPRESSION: Placement of non-tunneled central venous catheter via the right internal jugular vein. The catheter tip lies at the cavoatrial junction. The catheter is ready for immediate use. Electronically Signed   By: Aletta Edouard M.D.   On: 08/01/2022 14:16   IR US Guide Vasc Access  Right  Result Date: 08/01/2022 CLINICAL DATA:  Small-bowel obstruction and need for central venous catheter for parental nutrition. EXAM: NON-TUNNELED CENTRAL VENOUS CATHETER PLACEMENT WITH ULTRASOUND AND FLUOROSCOPIC GUIDANCE FLUOROSCOPY: 12 seconds.  4.6 mGy. PROCEDURE: The procedure, risks, benefits, and alternatives were explained to the patient's sister. Questions regarding the procedure were encouraged and answered. The patient's sister understands and consents to the procedure. A time-out was performed prior to initiating the procedure. The right neck and chest were prepped with chlorhexidine in a sterile fashion, and a sterile drape was applied covering the operative field. Maximum barrier sterile technique with sterile gowns and gloves were used for the procedure. Local anesthesia was provided with 1% lidocaine. Ultrasound was used to confirm patency of the right internal jugular vein. A permanent ultrasound image was recorded and saved. After creating a small venotomy incision, a 21 gauge needle was advanced into the right internal jugular vein under direct, real-time ultrasound guidance. Ultrasound image documentation was performed. After securing guidewire access, venous access was dilated. A 7 French, triple lumen Arrow central venous catheter was advanced over the wire under fluoroscopy. Final catheter positioning was confirmed and documented with a fluoroscopic spot image. All 3 lumens of the catheter were aspirated and flushed with saline. The catheter exit site was secured with 0-Prolene retention sutures. COMPLICATIONS: None.  No pneumothorax. FINDINGS: After catheter placement, the tip lies at the cavoatrial junction. The catheter aspirates normally and is ready for immediate use. IMPRESSION: Placement of non-tunneled central venous catheter via the right internal jugular vein. The catheter tip lies at the cavoatrial junction. The catheter is ready for immediate use. Electronically Signed   By:  Aletta Edouard M.D.   On: 08/01/2022 14:16   DG Abd Portable 1V-Small Bowel Obstruction Protocol-initial, 8 hr delay  Result Date: 07/31/2022 CLINICAL DATA:  Small bowel obstruction, 8 hour delayed film. EXAM: PORTABLE ABDOMEN - 1 VIEW COMPARISON:  Radiograph earlier today. FINDINGS: Enteric tube is in place. There is enteric contrast within dilated small bowel in the central abdomen. Persistent gaseous small bowel distension. There is no contrast in the colon. IMPRESSION: Enteric contrast within dilated small bowel in the central abdomen. Persistent gaseous small bowel distension. No contrast in the colon. Electronically Signed   By: Keith Rake M.D.   On: 07/31/2022 21:28   DG Chest 2 View  Result Date: 07/31/2022 CLINICAL DATA:  Pneumonia. EXAM: CHEST - 2 VIEW COMPARISON:  July 23, 2022. FINDINGS: The heart size and mediastinal contours are within normal limits. Both lungs are clear. Distal tip of nasogastric tube is seen in expected position of distal esophagus. The visualized skeletal structures are unremarkable. IMPRESSION: No acute cardiopulmonary abnormality seen. Distal tip of nasogastric tube is seen in expected position of distal esophagus. Electronically Signed   By: Marijo Conception M.D.   On: 07/31/2022 08:48   DG Abd 1 View  Result Date: 07/31/2022 CLINICAL DATA:  Small bowel obstruction. EXAM: ABDOMEN - 1 VIEW COMPARISON:  July 29, 2022 FINDINGS: Nasogastric tube tip is seen in expected position proximal stomach. Stable small bowel dilatation is noted concerning for distal small bowel obstruction. No colonic dilatation is noted. IMPRESSION: Stable small bowel dilatation is noted concerning for distal small bowel obstruction. Electronically Signed   By: Marijo Conception M.D.   On: 07/31/2022 08:46   DG Abd 1 View  Result Date: 07/29/2022 CLINICAL DATA:  Follow-up small bowel obstruction. EXAM: ABDOMEN - 1 VIEW COMPARISON:  07/28/2022 FINDINGS: A nasogastric tube is now seen  with tip overlying the mid stomach. Decreased small bowel dilatation is seen in the left abdomen. A paucity of colonic gas is again noted. IMPRESSION: Nasogastric tube in appropriate position. Decreased small bowel dilatation since prior exam. Electronically Signed   By: Marlaine Hind M.D.   On: 07/29/2022 09:07   DG [facility] Dys Tube Plc W/Fl W/Rad  Result Date: 07/28/2022 CLINICAL DATA:  Small bowel obstruction. Failed NG tube placement attempt by nursing staff on the floor. Patient presents for NG tube placement in the radiology department. EXAM: NASO G TUBE PLACEMENT WITH FL AND WITH RAD FLUOROSCOPY: Radiation Exposure Index (if provided by the fluoroscopic device): 0.5 mGy Number of Acquired Spot Images: 0 COMPARISON:  None Available. FINDINGS: Nasogastric tube was inserted through the right nostril and advanced into the stomach without resistance. No immediate complications. Nasogastric tube was secured to the skin surface. Tip of the nasogastric tube is within the stomach. IMPRESSION: 1. Successful placement of a nasogastric tube. Nasogastric tube is ready for use. Electronically Signed   By: Kathreen Devoid M.D.   On: 07/28/2022 09:00   DG Abd Portable 1V  Result Date: 07/28/2022 CLINICAL DATA:  Small bowel obstruction EXAM: PORTABLE ABDOMEN - 1 VIEW COMPARISON:  Abdominal radiograph dated July 27, 2022 FINDINGS: Multiple gas-filled dilated loops of small bowel again seen. Visualized lung bases with bibasilar atelectasis. No acute osseous abnormality. IMPRESSION: Multiple gas-filled dilated loops of small bowel again seen, compatible with small-bowel obstruction. Electronically Signed   By: Yetta Glassman M.D.   On: 07/28/2022 08:17   DG Abd Portable 1V  Result Date: 07/27/2022 CLINICAL DATA:  A 71 year old male presents with history of bowel obstruction. EXAM: PORTABLE ABDOMEN - 1 VIEW COMPARISON:  CT of the abdomen and pelvis from July 23, 2022. Also radiograph July 26, 2022 FINDINGS: 13 mm  distal LEFT ureteral calculus projects over the LEFT iliac crest. Bowel  loops remain distended throughout the abdomen. There is gas also present in the colon. Visualized lung bases are clear. Distension of small bowel is quite similar to prior imaging. On limited assessment no acute skeletal process. IMPRESSION: 1. 13 mm distal LEFT ureteral calculus projects over the LEFT iliac crest. Similar to prior imaging. 2. Persistent gaseous distension of small bowel with gas in the colon. Findings may reflect partial small bowel obstruction or ileus. Electronically Signed   By: Zetta Bills M.D.   On: 07/27/2022 08:17   DG Abd Portable 1V  Result Date: 07/26/2022 CLINICAL DATA:  Small bowel obstruction. EXAM: PORTABLE ABDOMEN - 1 VIEW COMPARISON:  CT abdomen pelvis dated July 23, 2022. FINDINGS: Multiple dilated loops of small bowel are not significantly changed. Air and stool are seen throughout the colon. No acute osseous abnormality. IMPRESSION: 1. Unchanged small bowel obstruction. Electronically Signed   By: Titus Dubin M.D.   On: 07/26/2022 09:39   CT ABDOMEN PELVIS WO CONTRAST  Result Date: 07/23/2022 CLINICAL DATA:  71 year old male with history of abdominal pain. Emesis. EXAM: CT ABDOMEN AND PELVIS WITHOUT CONTRAST TECHNIQUE: Multidetector CT imaging of the abdomen and pelvis was performed following the standard protocol without IV contrast. RADIATION DOSE REDUCTION: This exam was performed according to the departmental dose-optimization program which includes automated exposure control, adjustment of the mA and/or kV according to patient size and/or use of iterative reconstruction technique. COMPARISON:  No priors. FINDINGS: Lower chest: Scattered areas of mild cylindrical bronchiectasis with what appear to be regions of mucoid impaction in the lung bases bilaterally, most evident in the left lower lobe. Atherosclerotic calcifications in the descending thoracic aorta as well as the left main,  left anterior descending, left circumflex and right coronary arteries. Mild calcifications of the aortic valve. Hepatobiliary: No definite suspicious cystic or solid hepatic lesions are confidently identified on today's noncontrast CT examination. Unenhanced appearance of the gallbladder is unremarkable. Pancreas: No definite pancreatic mass or peripancreatic fluid collections or inflammatory changes are noted on today's noncontrast CT examination. Spleen: Unremarkable. 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 In the middle third of the left ureter (axial image 61 of series 2 and coronal image 70 of series 5) there is a 0.8 x 0.9 x 1.4 cm calculus which is associated with moderate proximal left hydroureteronephrosis. Additional ill-defined calcifications are noted in the left renal collecting system, potentially additional nonobstructive calculi, although the possibility of a partially calcified mass is not excluded, particularly in the lower pole where the calcifications are very ill-defined. Right kidney and bilateral adrenal glands are normal in appearance. No right hydroureteronephrosis. Urinary bladder is unremarkable in appearance. Stomach/Bowel: The appearance of the stomach is normal. There are several mildly dilated loops of mid small bowel measuring up to 4.4 cm in diameter, with multiple air-fluid levels. Distal small bowel is completely decompressed, with transition from dilated to nondilated loops of small bowel in the left side of the abdomen is, presumably from adhesions. There is some gas and stool noted throughout the colon, although the colon is relatively decompressed as well. Vascular/Lymphatic: Atherosclerotic calcifications throughout the abdominal aorta and pelvic vasculature. No lymphadenopathy noted in the abdomen or pelvis. Reproductive: Prostate gland and seminal vesicles are unremarkable in appearance. Other: No  significant volume of ascites.  No pneumoperitoneum. Musculoskeletal: There are no aggressive appearing lytic or blastic lesions noted in the visualized portions of the skeleton. IMPRESSION: 1. Findings are concerning for early  or partial small bowel obstruction. Surgical consultation is recommended. 2. There is also a large calculus measuring 8 x 9 x 14 mm in the middle third of the left ureter with moderate proximal left hydroureteronephrosis indicating obstruction. Multiple other poorly defined calcifications are present within the left kidney, potentially nonobstructive calculi. However, the possibility of a partially calcified left renal mass is not excluded. Further evaluation with nonemergent abdominal MRI with and without IV gadolinium should be considered in the near future after resolution of the patient's acute illness to better evaluate these findings and exclude malignancy. Urologic consultation is also suggested. 3. Aortic atherosclerosis, in addition to left main and three-vessel coronary artery disease. Please note that although the presence of coronary artery calcium documents the presence of coronary artery disease, the severity of this disease and any potential stenosis cannot be assessed on this non-gated CT examination. Assessment for potential risk factor modification, dietary therapy or pharmacologic therapy may be warranted, if clinically indicated. 4. Additional incidental findings, as above. Electronically Signed   By: Vinnie Langton M.D.   On: 07/23/2022 13:08   DG Chest Portable 1 View  Result Date: 07/23/2022 CLINICAL DATA:  71 year old male with history of shortness of breath. EXAM: PORTABLE CHEST 1 VIEW COMPARISON:  Chest x-ray 04/12/2020. FINDINGS: Lung volumes are low. Diffuse peribronchial cuffing. No consolidative airspace disease. No pleural effusions. No pneumothorax. No pulmonary nodule or mass noted. Pulmonary vasculature and the cardiomediastinal silhouette are within  normal limits. Atherosclerotic calcifications are noted in the thoracic aorta. IMPRESSION: 1. Diffuse peribronchial cuffing. Findings may suggest an acute bronchitis. 2. Aortic atherosclerosis. Electronically Signed   By: Vinnie Langton M.D.   On: 07/23/2022 09:01     The results of significant diagnostics from this hospitalization (including imaging, microbiology, ancillary and laboratory) are listed below for reference.     Microbiology: Recent Results (from the past 240 hour(s))  Culture, blood (Routine X 2) w Reflex to ID Panel     Status: None   Collection Time: 08/01/22 12:28 PM   Specimen: BLOOD  Result Value Ref Range Status   Specimen Description BLOOD BLOOD LEFT HAND  Final   Special Requests   Final    BOTTLES DRAWN AEROBIC AND ANAEROBIC Blood Culture adequate volume   Culture   Final    NO GROWTH 5 DAYS Performed at [facility], [address] [place], [place] [address]    Report Status 08/06/2022 FINAL  Final  Culture, blood (Routine X 2) w Reflex to ID Panel     Status: None   Collection Time: 08/01/22 12:38 PM   Specimen: BLOOD  Result Value Ref Range Status   Specimen Description BLOOD BLOOD RIGHT HAND  Final   Special Requests   Final    BOTTLES DRAWN AEROBIC AND ANAEROBIC Blood Culture adequate volume   Culture   Final    NO GROWTH 5 DAYS Performed at [facility], [address] [place], [place] [address]    Report Status 08/06/2022 FINAL  Final  Urine Culture     Status: Abnormal   Collection Time: 08/02/22  2:53 PM   Specimen: Urine, Catheterized  Result Value Ref Range Status   Specimen Description   Final    URINE, CATHETERIZED Performed at [facility], [address], [place], [place] [address]    Special Requests   Final    Normal Performed at [facility], [address] [place],  [address]    Culture (A)  Final    >=100,000 COLONIES/mL ESCHERICHIA COLI Confirmed Extended Spectrum  Beta-Lactamase Producer (ESBL).  In bloodstream infections from ESBL organisms, carbapenems are preferred over piperacillin/tazobactam. They are shown to have a lower risk of mortality.    Report Status 08/04/2022 FINAL  Final   Organism ID, Bacteria ESCHERICHIA COLI (A)  Final      Susceptibility   Escherichia coli - MIC*    AMPICILLIN >=32 RESISTANT Resistant     CEFAZOLIN >=64 RESISTANT Resistant     CEFEPIME 16 RESISTANT Resistant     CEFTRIAXONE >=64 RESISTANT Resistant     CIPROFLOXACIN >=4 RESISTANT Resistant     GENTAMICIN <=1 SENSITIVE Sensitive     IMIPENEM <=0.25 SENSITIVE Sensitive     NITROFURANTOIN <=16 SENSITIVE Sensitive     TRIMETH/SULFA >=320 RESISTANT Resistant     AMPICILLIN/SULBACTAM 16 INTERMEDIATE Intermediate     PIP/TAZO <=4 SENSITIVE Sensitive     * >=100,000 COLONIES/mL ESCHERICHIA COLI     Labs: BNP (last 3 results) No results for input(s): "BNP" in the last 8760 hours. Basic Metabolic Panel: Recent Labs  Lab 08/01/22 0634 08/02/22 0608 08/03/22 0359 08/04/22 0532  NA 138 140 137 135  K 3.4* 3.3* 4.0 3.9  CL 102 105 107 107  CO2 _0 21*  GLUCOSE 169* 192* 173* 186*  BUN _1 24*  CREATININE 1.87* 1.47* 1.47* 1.54*  CALCIUM 9.0 8.8* 8.9 8.8*  MG 2.3 2.3 2.1  --   PHOS 3.1 3.3 2.9  --    Liver Function Tests: Recent Labs  Lab 08/03/22 0359  AST 16  ALT 14  ALKPHOS 66  BILITOT 0.3  PROT 6.7  ALBUMIN 3.2*   No results for input(s): "LIPASE", "AMYLASE" in the last 168 hours. No results for input(s): "AMMONIA" in the last 168 hours. CBC: Recent Labs  Lab 08/02/22 0608 08/03/22 0359 08/04/22 0532 08/05/22 0552 08/06/22 0540  WBC 10.4 14.6* 10.5 11.5* 10.9*  HGB 12.0* 11.3* 10.7* 10.2* 10.1*  HCT 37.6* 34.9* 34.2* 32.0* 32.0*  MCV 83.0 82.9 85.7 83.6 84.4  PLT 256 262 238 254 272   Cardiac Enzymes: No results for input(s): "CKTOTAL", "CKMB", "CKMBINDEX", "TROPONINI" in the last 168 hours. BNP: Invalid input(s):  "POCBNP" CBG: Recent Labs  Lab 08/05/22 1658 08/06/22 0629 08/06/22 1134 08/06/22 1549 08/07/22 0924  GLUCAP 154* 141* 169* 185* 128*   D-Dimer No results for input(s): "DDIMER" in the last 72 hours. Hgb A1c No results for input(s): "HGBA1C" in the last 72 hours. Lipid Profile No results for input(s): "CHOL", "HDL", "LDLCALC", "TRIG", "CHOLHDL", "LDLDIRECT" in the last 72 hours.  Thyroid function studies No results for input(s): "TSH", "T4TOTAL", "T3FREE", "THYROIDAB" in the last 72 hours.  Invalid input(s): "FREET3" Anemia work up No results for input(s): "VITAMINB12", "FOLATE", "FERRITIN", "TIBC", "IRON", "RETICCTPCT" in the last 72 hours. Urinalysis    Component Value Date/Time   COLORURINE YELLOW (A) 07/31/2022 1414   APPEARANCEUR CLOUDY (A) 07/31/2022 1414   LABSPEC 1.016 07/31/2022 1414   PHURINE 5.0 07/31/2022 1414   GLUCOSEU NEGATIVE 07/31/2022 1414   HGBUR LARGE (A) 07/31/2022 1414   BILIRUBINUR NEGATIVE 07/31/2022 1414   KETONESUR 5 (A) 07/31/2022 1414   PROTEINUR 100 (A) 07/31/2022 1414   NITRITE POSITIVE (A) 07/31/2022 1414   LEUKOCYTESUR LARGE (A) 07/31/2022 1414   Sepsis Labs Recent Labs  Lab 08/03/22 0359 08/04/22 0532 08/05/22 0552 08/06/22 0540  WBC 14.6* 10.5 11.5* 10.9*   Microbiology Recent Results (from the past 240  hour(s))  Culture, blood (Routine X 2) w Reflex to ID Panel     Status: None   Collection Time: 08/01/22 12:28 PM   Specimen: BLOOD  Result Value Ref Range Status   Specimen Description BLOOD BLOOD LEFT HAND  Final   Special Requests   Final    BOTTLES DRAWN AEROBIC AND ANAEROBIC Blood Culture adequate volume   Culture   Final    NO GROWTH 5 DAYS Performed at [facility], [address], [place], [place] [address]    Report Status 08/06/2022 FINAL  Final  Culture, blood (Routine X 2) w Reflex to ID Panel     Status: None   Collection Time: 08/01/22 12:38 PM   Specimen: BLOOD  Result Value Ref Range Status    Specimen Description BLOOD BLOOD RIGHT HAND  Final   Special Requests   Final    BOTTLES DRAWN AEROBIC AND ANAEROBIC Blood Culture adequate volume   Culture   Final    NO GROWTH 5 DAYS Performed at [facility], [address] [place], [place] [address]    Report Status 08/06/2022 FINAL  Final  Urine Culture     Status: Abnormal   Collection Time: 08/02/22  2:53 PM   Specimen: Urine, Catheterized  Result Value Ref Range Status   Specimen Description   Final    URINE, CATHETERIZED Performed at [facility], [address], [place], [place] [address]    Special Requests   Final    Normal Performed at [facility], [place] [place],  [address]    Culture (A)  Final    >=100,000 COLONIES/mL ESCHERICHIA COLI Confirmed Extended Spectrum Beta-Lactamase Producer (ESBL).  In bloodstream infections from ESBL organisms, carbapenems are preferred over piperacillin/tazobactam. They are shown to have a lower risk of mortality.    Report Status 08/04/2022 FINAL  Final   Organism ID, Bacteria ESCHERICHIA COLI (A)  Final      Susceptibility   Escherichia coli - MIC*    AMPICILLIN >=32 RESISTANT Resistant     CEFAZOLIN >=64 RESISTANT Resistant     CEFEPIME 16 RESISTANT Resistant     CEFTRIAXONE >=64 RESISTANT Resistant     CIPROFLOXACIN >=4 RESISTANT Resistant     GENTAMICIN <=1 SENSITIVE Sensitive     IMIPENEM <=0.25 SENSITIVE Sensitive     NITROFURANTOIN <=16 SENSITIVE Sensitive     TRIMETH/SULFA >=320 RESISTANT Resistant     AMPICILLIN/SULBACTAM 16 INTERMEDIATE Intermediate     PIP/TAZO <=4 SENSITIVE Sensitive     * >=100,000 COLONIES/mL ESCHERICHIA COLI     Time coordinating discharge:  I have spent 35 minutes face to face with the patient and on the ward discussing the patients care, assessment, plan and disposition with other care givers. >50% of the time was devoted counseling the patient about the risks and benefits of treatment/Discharge  disposition and coordinating care.   SIGNED:   Damita Lack, MD  [facility] 08/07/2022, 10:49 AM   If 7PM-7AM, please contact night-coverage

## 2022-08-05 NOTE — Progress Notes (Signed)
Pt was getting a PICC line inserted, unable to see pt this was until 10:00 AM

## 2022-08-05 NOTE — Progress Notes (Signed)
Pt has no compalints, repositioned pt

## 2022-08-06 LAB — CBC
HCT: 32 % — ABNORMAL LOW (ref 39.0–52.0)
Hemoglobin: 10.1 g/dL — ABNORMAL LOW (ref 13.0–17.0)
MCH: 26.6 pg (ref 26.0–34.0)
MCHC: 31.6 g/dL (ref 30.0–36.0)
MCV: 84.4 fL (ref 80.0–100.0)
Platelets: 272 10*3/uL (ref 150–400)
RBC: 3.79 MIL/uL — ABNORMAL LOW (ref 4.22–5.81)
RDW: 15.5 % (ref 11.5–15.5)
WBC: 10.9 10*3/uL — ABNORMAL HIGH (ref 4.0–10.5)
nRBC: 0 % (ref 0.0–0.2)

## 2022-08-06 LAB — CULTURE, BLOOD (ROUTINE X 2)
Culture: NO GROWTH
Culture: NO GROWTH
Special Requests: ADEQUATE
Special Requests: ADEQUATE

## 2022-08-06 LAB — GLUCOSE, CAPILLARY
Glucose-Capillary: 141 mg/dL — ABNORMAL HIGH (ref 70–99)
Glucose-Capillary: 169 mg/dL — ABNORMAL HIGH (ref 70–99)
Glucose-Capillary: 185 mg/dL — ABNORMAL HIGH (ref 70–99)

## 2022-08-06 NOTE — Progress Notes (Signed)
Doing well, no acute events overnight. No complaints, sister is present at bedside as well.  Vitals are stable.  DC summary completed on 07/26/22 but later notified the facility will not have his Abx until Monday so we will have to monitor him in the hospital until then.  Discussed care with patient's RN  Gerlean Ren MD [facility]

## 2022-08-06 NOTE — TOC Progression Note (Signed)
Transition of Care [facility]) - Progression Note    Patient Details  Name: Rodney Estrada MRN: [id number] Date of Birth: [birth year]  Transition of Care [facility]) CM/SW [place], LCSW Phone Number: 08/06/2022, 11:24 AM  Clinical Narrative:   Patient's insurance is not managed by [facility]. Left voicemail for [facility] admissions coordinator asking him to start auth through [facility].  Expected Discharge Plan and Services           Expected Discharge Date: 08/05/22                                     Social Determinants of Health (SDOH) Interventions    Readmission Risk Interventions     No data to display

## 2022-08-06 NOTE — Progress Notes (Signed)
Patient is alert and oriented x3. Has flat affect w/delayed responses. Left sided weakness and L-arm contraction. Has blanchable redness to buttocks. Repositioned and performed personal hygiene. Placed barrier ointment on buttocks. Patient  had a smear of a bowel movement, Performed CHG and catheter care. Urine is tea colored. Patient denied pain.

## 2022-08-07 LAB — GLUCOSE, CAPILLARY
Glucose-Capillary: 128 mg/dL — ABNORMAL HIGH (ref 70–99)
Glucose-Capillary: 298 mg/dL — ABNORMAL HIGH (ref 70–99)
Glucose-Capillary: 122 mg/dL — ABNORMAL HIGH (ref 70–99)

## 2022-08-07 NOTE — Progress Notes (Signed)
Pt discharged to [facility] this evening with stable vitals, PICC line, and foley catheter in place.  All of his paperwork and belongings were sent with him.

## 2022-08-07 NOTE — TOC Progression Note (Signed)
Transition of Care [facility]) - Progression Note    Patient Details  Name: Rodney Estrada MRN: [id number] Date of Birth: [birth year]  Transition of Care [facility]) CM/SW [place], [place] Phone Number: 08/07/2022, 10:47 AM  Clinical Narrative:     Donella Stade is calling Ricky at [facility] regarding [facility] authorization and IV antiobiotics. Patient will discharge to SNF today.        Expected Discharge Plan and [employer].     Expected Discharge Date: 08/05/22                                     Social Determinants of Health (SDOH) Interventions    Readmission Risk Interventions     No data to display

## 2022-08-07 NOTE — TOC Transition Note (Addendum)
Transition of Care [facility]) - CM/SW Discharge Note   Patient Details  Name: Quince Santana MRN: [id number] Date of Birth: 24-May-1951  Transition of Care [facility]) CM/SW Contact:  Colen Darling, [place] Phone Number: 08/07/2022, 4:38 PM   Clinical Narrative:     Transfer to [facility] today. RN calling report. TOC calling EMS.   Final next level of care: Skilled Nursing Facility Barriers to Discharge: Barriers Resolved   Patient Goals and CMS Choice Patient states their goals for this hospitalization and ongoing recovery are:: attend SNF CMS Medicare.gov Compare Post Acute Care list provided to:: Patient Choice offered to / list presented to : Patient  Discharge Placement PASRR number recieved: 08/07/22 Existing PASRR number confirmed : 08/07/22          Patient chooses bed at:  [facility]) Patient to be transferred to facility by: [place] Name of family member notified: Caprice Renshaw: [phone] Patient and family notified of of transfer: 08/07/22  Discharge Plan and Services                 [facility]                    Social Determinants of Health (SDOH) Interventions     Readmission Risk Interventions     No data to display

## 2022-08-07 NOTE — Progress Notes (Signed)
Original plan was to discharge patient on 11/11.  Patient has remained stable, discharge summary has been completed.  Thereafter issues with facility unable to acquire antibiotics and different insurance authorization.  Patient continues to remain stable. Vital signs are stable as well.  No additional recommendations at this time.  Rodney Ren MD [facility]

## 2022-08-07 NOTE — Care Management Important Message (Signed)
Important Message  Patient Details  Name: Rodney Estrada MRN: [id number] Date of Birth: [birth year]   Medicare Important Message Given:  Other (see comment)  Attempted to review Medicare IM with patient via room phone due to isolation status.  Unable to reach upon attempt.  Copy of Medicare IM previously left in room prior to isolation status.     Dannette Barbara 08/07/2022, 12:04 PM

## 2022-08-07 NOTE — Progress Notes (Signed)
   08/07/22 1629  Medical Necessity for Transport Certificate --- IF THIS TRANSPORT IS ROUND TRIP OR SCHEDULED AND REPEATED, A PHYSICIAN MUST COMPLETE THIS FORM  Transport from: Teacher, English as a foreign language) Doctor, hospital to Teacher, English as a foreign language) Other (Comment) Development worker, community)  Did the patient arrive from a [place], Hall or Group Home? Yes  Care Facility Name Other (enter name of facility below)  [place] Name Van  Is this the closest appropriate facility? Yes  Date of Transport Service 08/07/22  Name of [facility]  Round Trip Transport? No  Reason for Transport Discharge  Is this a hospice patient? No  Describe the Medical Condition small bowel obstruction  Q1 Are ALL the following "true"? 1. Patient unable to get up from bed without assistance  AND  2. Unable to ambulate  AND  3. Unable to sit in a chair, including wheelchair. Yes  Q2 Could the patient be transported safely by other means of transportation (I.E., wheelchair van)? No  Q3 Please check any of the following conditions that apply at the time of transport: Risk of injury to self and/or others  Credentials DP  Date Signed 08/07/22

## 2022-08-07 NOTE — NC FL2 (Signed)
[place] LEVEL OF CARE SCREENING TOOL     IDENTIFICATION  Patient Name: Rodney Estrada Birthdate: 13-Aug-1951 Sex: male Admission Date (Current Location): 07/23/2022  [facility] and [place] Number:  Engineering geologist and Address:  [facility], [address] [place], [place] [address]      Provider Number: [id number]  Attending Physician Name and Address:  Damita Lack, MD  Relative Name and Phone Number:  Caprice Renshaw- sister- [phone]    Current Level of Care: Hospital Recommended Level of Care: Nursing Facility Prior Approval Number:    Date Approved/Denied:   PASRR Number: [id number]  Discharge Plan: SNF    Current Diagnoses: Patient Active Problem List   Diagnosis Date Noted   Hypernatremia 07/31/2022   Hiccups 07/28/2022   Hypokalemia 07/27/2022   SBO (small bowel obstruction) ([place]) 07/23/2022   Nephrolithiasis 07/23/2022   Partial intestinal obstruction ([facility])    Cerebral edema ([place]) 04/16/2020   Hypertensive emergency 04/16/2020   Essential hypertension 04/16/2020   Hyperlipidemia LDL goal <70 04/16/2020   Type 2 diabetes mellitus ([place]) 04/16/2020   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 04/16/2020   AKI (acute kidney injury) ([place]) in the setting of stage IIIa chronic kidney disease 04/16/2020   Tobacco use disorder 04/16/2020   Alcohol abuse 04/16/2020   Cocaine abuse ([place]) 04/16/2020   Obesity 04/16/2020   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 04/05/2020    Orientation RESPIRATION BLADDER Height & Weight     Self, Time, Situation, Place  Normal External catheter Weight: 188 lb 7.9 oz (85.5 kg) Height:  '5\' 9"'$  (175.3 cm)  BEHAVIORAL SYMPTOMS/MOOD NEUROLOGICAL BOWEL NUTRITION STATUS   (NA)  (AOx4) Continent Diet (carb modified)  AMBULATORY STATUS COMMUNICATION OF NEEDS Skin   Total Care Verbally Other (Comment) (dry/redness: arm, bilateral buttocks)                        Personal Care Assistance Level of Assistance  Dressing, Feeding, Bathing, Total care Bathing Assistance: Maximum assistance Feeding assistance: Maximum assistance Dressing Assistance: Maximum assistance Total Care Assistance: Maximum assistance   Functional Limitations Info  Sight, Hearing, Speech Sight Info: Adequate Hearing Info: Adequate Speech Info: Impaired (History of stroke, slow to respond)    SPECIAL CARE FACTORS FREQUENCY  PT (By licensed PT), OT (By licensed OT)     PT Frequency: 5x a week OT Frequency: 5x a week            Contractures Contractures Info: Present (left arm)    Additional Factors Info  Code Status Code Status Info: Full Allergies Info: NKA           Current Medications (08/07/2022):  This is the current hospital active medication list Current Facility-Administered Medications  Medication Dose Route Frequency Provider Last Rate Last Admin   acetaminophen (TYLENOL) tablet 650 mg  650 mg Oral Q6H PRN Jose Persia, MD   650 mg at 08/06/22 2137   Or   acetaminophen (TYLENOL) suppository 650 mg  650 mg Rectal Q6H PRN Jose Persia, MD       allopurinol (ZYLOPRIM) tablet 100 mg  100 mg Oral Daily Kc, Ramesh, MD   100 mg at 08/07/22 0956   alum & mag hydroxide-simeth (MAALOX/MYLANTA) 200-200-20 MG/5ML suspension 30 mL  30 mL Oral Q6H PRN Annita Brod, MD   30 mL at 07/31/22 0419   amLODipine (NORVASC) tablet 10 mg  10  mg Oral Daily Kc, Maren [place], MD   10 mg at 08/07/22 0956   ARIPiprazole (ABILIFY) tablet 2 mg  2 mg Oral Daily Kc, Ramesh, MD   2 mg at 08/07/22 5374   aspirin chewable tablet 81 mg  81 mg Oral Daily Jose Persia, MD   81 mg at 08/07/22 0955   atorvastatin (LIPITOR) tablet 40 mg  40 mg Oral QPM Jose Persia, MD   40 mg at 08/06/22 1624   Chlorhexidine Gluconate Cloth 2 % PADS 6 each  6 each Topical Daily Sharen Hones, MD   6 each at 08/07/22 1001   chlorproMAZINE (THORAZINE) injection 25 mg  25 mg  Intramuscular TID PRN Annita Brod, MD   25 mg at 07/28/22 2036   clopidogrel (PLAVIX) tablet 75 mg  75 mg Oral Daily Jose Persia, MD   75 mg at 08/07/22 0955   enoxaparin (LOVENOX) injection 50 mg  0.5 mg/kg Subcutaneous Q24H Alison Murray, RPH   50 mg at 08/06/22 1624   ertapenem (INVANZ) 1,000 mg in sodium chloride 0.9 % 100 mL IVPB  1 g Intravenous Q24H Damita Lack, MD   Stopped at 08/06/22 2208   feeding supplement (ENSURE ENLIVE / ENSURE PLUS) liquid 237 mL  237 mL Oral TID BM Kc, Ramesh, MD   237 mL at 08/07/22 1001   gabapentin (NEURONTIN) capsule 300 mg  300 mg Oral TID Antonieta Pert, MD   300 mg at 08/07/22 0956   hydrALAZINE (APRESOLINE) injection 5 mg  5 mg Intravenous Q4H PRN Annita Brod, MD   5 mg at 08/03/22 0602   insulin aspart (novoLOG) injection 0-15 Units  0-15 Units Subcutaneous TID WC Vallery Sa D, RPH   2 Units at 08/07/22 1000   labetalol (NORMODYNE) tablet 100 mg  100 mg Oral TID Antonieta Pert, MD   100 mg at 08/06/22 2137   multivitamin with minerals tablet 1 tablet  1 tablet Oral Daily Antonieta Pert, MD   1 tablet at 08/07/22 0955   ondansetron (ZOFRAN) tablet 4 mg  4 mg Oral Q6H PRN Jose Persia, MD   4 mg at 08/03/22 2045   Or   ondansetron (ZOFRAN) injection 4 mg  4 mg Intravenous Q6H PRN Jose Persia, MD   4 mg at 08/03/22 0013   oxybutynin (DITROPAN-XL) 24 hr tablet 5 mg  5 mg Oral QHS Kc, Ramesh, MD   5 mg at 08/06/22 2137   pantoprazole (PROTONIX) EC tablet 40 mg  40 mg Oral BID Annita Brod, MD   40 mg at 08/07/22 0956   polyethylene glycol (MIRALAX / GLYCOLAX) packet 17 g  17 g Oral Daily PRN Jose Persia, MD       promethazine (PHENERGAN) 12.5 mg in sodium chloride 0.9 % 50 mL IVPB  12.5 mg Intravenous Q6H PRN Annita Brod, MD   Stopped at 07/28/22 1511   sertraline (ZOLOFT) tablet 150 mg  150 mg Oral Daily Kc, Ramesh, MD   150 mg at 08/07/22 0955   sodium chloride flush (NS) 0.9 % injection 3 mL  3 mL Intravenous Q12H  Jose Persia, MD   3 mL at 08/07/22 1001     Discharge Medications: Please see discharge summary for a list of discharge medications.  Relevant Imaging Results:  Relevant Lab Results:   Additional Information SSN: [id number]  Colen Darling, LCSWA

## 2022-08-07 NOTE — Progress Notes (Signed)
Rodney Estrada to be D/C'd Home per MD order.  Discussed prescriptions and follow up appointments with the patient. Prescriptions given to patient, medication list explained in detail. Pt verbalized understanding.  Allergies as of 08/07/2022   No Known Allergies      Medication List     STOP taking these medications    insulin detemir 100 UNIT/ML injection Commonly known as: LEVEMIR   Lantus SoloStar 100 UNIT/ML Solostar Pen Generic drug: insulin glargine       TAKE these medications    allopurinol 100 MG tablet Commonly known as: ZYLOPRIM Take 100 mg by mouth daily.   amLODipine 10 MG tablet Commonly known as: NORVASC Take 10 mg by mouth daily. What changed: Another medication with the same name was removed. Continue taking this medication, and follow the directions you see here.   Anti-Itch lotion Generic drug: camphor-menthol Apply topically.   ARIPiprazole 2 MG tablet Commonly known as: ABILIFY Take 2 mg by mouth daily.   aspirin 81 MG chewable tablet Chew 1 tablet (81 mg total) by mouth daily.   atorvastatin 40 MG tablet Commonly known as: LIPITOR Take 1 tablet (40 mg total) by mouth every evening. What changed:  medication strength how much to take when to take this   carboxymethylcellulose 0.5 % Soln Commonly known as: REFRESH PLUS Apply to eye.   clopidogrel 75 MG tablet Commonly known as: PLAVIX Take 1 tablet (75 mg total) by mouth daily.   diphenhydrAMINE 25 mg capsule Commonly known as: BENADRYL Take 1 capsule (25 mg total) by mouth every 6 (six) hours as needed for itching.   ertapenem  IVPB Commonly known as: INVANZ Inject 1 g into the vein daily for 10 days. Indication:  ESBL UTI First Dose: Yes Last Day of Therapy:  08/14/22 Labs - Once weekly:  CBC/D and BMP, Labs - Every other week:  ESR and CRP Method of administration: Mini-Bag Plus / Gravity Pull midline at the completion of IV therapy Method of administration may be changed at  the discretion of home infusion pharmacist based upon assessment of the patient and/or caregiver's ability to self-administer the medication ordered.   fosfomycin 3 g Pack Commonly known as: MONUROL Take 3 g by mouth every 3 (three) days for 6 doses. Start taking on: August 15, 2022   furosemide 20 MG tablet Commonly known as: LASIX Take 20 mg by mouth daily.   gabapentin 300 MG capsule Commonly known as: NEURONTIN Take 300 mg by mouth 3 (three) times daily.   Hibiclens 4 % external liquid Generic drug: chlorhexidine Apply topically.   insulin aspart 100 UNIT/ML injection Commonly known as: novoLOG Inject 0-15 Units into the skin 3 (three) times daily with meals. insulin aspart (novoLOG) injection 0-15 Units CBG < 70: Implement Hypoglycemia Standing Orders and refer to Hypoglycemia Standing Orders sidebar report CBG 70 - 120: 0 units CBG 121 - 150: 2 units CBG 151 - 200: 3 units CBG 201 - 250: 5 units CBG 251 - 300: 8 units CBG 301 - 350: 11 units CBG 351 - 400: 15 units CBG > 400: call MD   labetalol 100 MG tablet Commonly known as: NORMODYNE Take 1 tablet (100 mg total) by mouth 3 (three) times daily.   lisinopril 40 MG tablet Commonly known as: ZESTRIL Take 40 mg by mouth daily.   oxybutynin 5 MG 24 hr tablet Commonly known as: DITROPAN-XL Take 1 tablet (5 mg total) by mouth at bedtime.   pantoprazole 40 MG tablet  Commonly known as: PROTONIX Take 1 tablet (40 mg total) by mouth 2 (two) times daily. What changed: when to take this   polyethylene glycol 17 g packet Commonly known as: MIRALAX / GLYCOLAX Take 17 g by mouth daily as needed for mild constipation.   Resource ThickenUp Clear Powd Take 120 g by mouth as needed (honey thick liquids).   sertraline 50 MG tablet Commonly known as: ZOLOFT Take 150 mg by mouth daily.   Systane Balance 0.6 % Soln Generic drug: Propylene Glycol Apply to eye.   Vitamin D (Ergocalciferol) 1.25 MG (50000 UNIT) Caps  capsule Commonly known as: DRISDOL Take 50,000 Units by mouth every 7 (seven) days. Mondays               Discharge Care Instructions  (From admission, onward)           Start     Ordered   08/05/22 0000  Change dressing on IV access line weekly and PRN  (Home infusion instructions - [facility] )        08/05/22 1024            Vitals:   08/07/22 0750 08/07/22 1539  BP: (!) 120/97 110/71  Pulse: (!) 57 66  Resp: 14 16  Temp: 97.8 F (36.6 C) 98 F (36.7 C)  SpO2: 100% 96%    Skin clean, dry and intact without evidence of skin break down, no evidence of skin tears noted. IV catheter discontinued intact. Site without signs and symptoms of complications. Dressing and pressure applied. Pt denies pain at this time. No complaints noted.  An After Visit Summary was printed and given to the patient. Patient escorted via [place], and D/C home via private auto.  [place] C. Deatra Ina

## 2022-08-09 ENCOUNTER — Telehealth: Payer: Self-pay

## 2022-08-09 NOTE — Telephone Encounter (Addendum)
Called patient's sister as patients number in chart is not a valid number. Left Message to call to schedule surgery. Will try again.

## 2022-08-11 NOTE — Telephone Encounter (Signed)
I spoke with Patients Sister and Patients Facility Engineer, agricultural in [place] We have discussed possible surgery dates and Tuesday December 5th, 2023 was agreed upon by all parties. Patient given information about surgery date, what to expect pre-operatively and post operatively.  We discussed that a Pre-Admission Testing office will be calling to set up the pre-op visit that will take place prior to surgery, and that these appointments are typically done over the phone with a Pre-Admissions RN.  Informed patient that our office will communicate any additional care to be provided after surgery. Patients questions or concerns were discussed during our call. Advised to call our office should there be any additional information, questions or concerns that arise. Patient verbalized understanding.

## 2022-08-11 NOTE — Progress Notes (Signed)
[facility] Posting Form   Surgery Date/Time: Date: 08/29/2022  Surgeon: Dr. Keavon Giovanni, MD   Surgery Location: Day Surgery  Inpt ( No  )   Outpt (Yes)   Obs ( No  )   Diagnosis: N20.1 Left Ureteral Stone  -CPT: [phone]  Surgery: Left Ureteroscopy with Laser Lithotripsy and Stent Exchange  Stop Anticoagulations: N/A  Cardiac/Medical/Pulmonary Clearance needed: no  *Orders entered into EPIC  Date: 08/11/22   *Case booked in EPIC  Date: 08/10/2022  *Notified pt of Surgery: Date: 08/10/2022  PRE-OP UA & CX: yes, will obtain at facility [facility] in [place], orders faxed on 08/11/2022  *Placed into Prior Authorization Work Fabio Bering Date: 08/11/22  Assistant/laser/rep:No

## 2022-08-25 ENCOUNTER — Encounter
Admission: RE | Admit: 2022-08-25 | Discharge: 2022-08-25 | Disposition: A | Discharge status: Home / Self Care | Payer: Medicare Other | Source: Ambulatory Visit | Attending: Urology | Admitting: Urology

## 2022-08-25 ENCOUNTER — Encounter: Payer: Self-pay | Admitting: Urology

## 2022-08-25 VITALS — Ht 69.0 in | Wt 188.5 lb

## 2022-08-25 DIAGNOSIS — I63511 Cerebral infarction due to unspecified occlusion or stenosis of right middle cerebral artery: Secondary | ICD-10-CM

## 2022-08-25 NOTE — Progress Notes (Signed)
Perioperative Services  Pre-Admission/Anesthesia Testing Clinical Review  Date: 08/25/22  Patient Demographics:  Name: Rodney Estrada DOB:   1950-11-01 MRN:   [id number]  Planned Surgical Procedure(s):    Case: [id number] Date/Time: 08/29/22 1017   Procedure: CYSTOSCOPY/URETEROSCOPY/HOLMIUM LASER/STENT EXCHANGE (Left)   Anesthesia type: General   Pre-op diagnosis: Left ureteral stone   Location: [facility] OR ROOM 10 / [place] [facility]S FOR ANESTHESIA GROUP   Surgeons: Abbie Sons, MD   NOTE: Available PAT nursing documentation and vital signs have been reviewed. Clinical nursing staff has updated patient's PMH/PSHx, current medication list, and drug allergies/intolerances to ensure comprehensive history available to assist in medical decision making as it pertains to the aforementioned surgical procedure and anticipated anesthetic course. Extensive review of available clinical information performed. [place] PMH and PSHx updated with any diagnoses/procedures that  may have been inadvertently omitted during his intake with the pre-admission testing department's nursing staff.  Clinical Discussion:  Rodney Estrada is a 71 y.o. male who is submitted for pre-surgical anesthesia review and clearance prior to him undergoing the above procedure. Patient is a Current Smoker. 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  Patient is followed by cardiology Nehemiah Massed, MD). He was last seen in the cardiology clinic on 09/27/2021; notes reviewed.  At the time of his clinic visit, patient reporting shortness of breath.  He denied any episodes of chest pain, PND, orthopnea, palpitations, significant peripheral edema, vertiginous symptoms, or presyncope/syncope.  Patient with a past medical history  significant for cardiovascular diagnoses.  Patient is status post an acute RIGHT MCA stroke that occurred on 04/05/2020.  Patient was treated with mechanical thrombectomy, stenting (2.5 x 15 mm Resolute Onyx stent) and TPA administration.  Patient with residual LEFT hemiplegia status post neurological event.  He now resides in a long-term care facility.  Last TTE was performed on 08/29/2021 revealing a normal left ventricular systolic function with an EF of >55%. Left ventricular diastolic Doppler parameters consistent with abnormal relaxation (G1DD).  There was moderate left atrial enlargement.  Trivial to mild pan valvular regurgitation was noted.  All transvalvular gradients were found to be normal suggesting no evidence of valvular stenosis.  Recent CT imaging performed on 07/23/2022 showed multivessel CAD with calcifications in the LM, LAD, LCx, and RCA.  Calcifications also noted in the descending thoracic aorta.  Patient with an atrial fibrillation diagnosis; CHA2DS2-VASc Score = 6 (age, CHF, HTN, CVA x 2, vascular disease history). His rate and rhythm are currently being maintained on oral labetalol. He is chronically anticoagulated using ASA + clopidogrel; reported to be compliant with therapy with no evidence or reports of GI bleeding.  Blood pressure mildly elevated at 130/98 mmHg on currently prescribed CCB (amlodipine), diuretic (furosemide), beta-blocker (labetalol), and ACEI (lisinopril) therapies. He is on a atorvastatin for his HLD diagnosis and further ASCVD prevention. T2DM currently being controlled with diet/lifestyle modification; last HgbA1c was 6.2% when checked on 07/23/2022.  Functional capacity limited by patient's post CVA hemiparesis and multiple medical comorbidities.  With his hemiparesis, it is impossible to determine patient's functional capacity and METS, however given his current state of health, patient not felt to be able to achieve 4 METS of physical activity without  experiencing angina/anginal equivalent symptoms.  No changes were made to his medication regimen.  Patient to follow-up with outpatient cardiology in 1 year  or sooner if needed.  [name] is scheduled for a CYSTOSCOPY/URETEROSCOPY/HOLMIUM LASER/STENT EXCHANGE (Left) on 08/29/2022 with Dr. Ezriel Giovanni, MD.  Given patient's past medical history significant for cardiovascular diagnoses, presurgical cardiac clearance was sought by the PAT team. Per cardiology, "this patient is optimized for surgery and may proceed with the planned procedural course with a LOW risk of significant perioperative cardiovascular complications".  Again, this patient is on daily DAPT therapy.  He has been instructed on recommendations for holding his clopidogrel for 3 days prior to his procedure with plans to restart as soon as postoperative bleeding risk felt to be minimized by his attending surgeon. The patient has been instructed that his last dose of his clopidogrel should be on 08/25/2022.  The patient will continue his daily low-dose ASA throughout his perioperative course.  Patient denies previous perioperative complications with anesthesia in the past. In review of the available records, it is noted that patient underwent a general anesthetic course here at [facility] ([facility]) in 07/2022 without documented complications.      08/07/2022    9:36 PM 08/07/2022    3:39 PM 08/07/2022    7:50 AM  Vitals with BMI  Systolic 539 767 341  Diastolic 64 71 97  Pulse 61 66 57    Providers/Specialists:   NOTE: Primary physician provider listed below. Patient may have been seen by APP or partner within same practice.  / PROVIDER ROLE / SPECIALTY LAST OV  Abbie Sons, MD Urology (Surgeon) 08/04/2022  Pcp, No Primary Care Provider ???  Serafina Royals, MD Cardiology 09/27/2021  Antony Contras, MD Neurology 05/19/2020   Allergies:  Patient has no known allergies.  Current Home  Medications:   No current facility-administered medications for this encounter.    allopurinol (ZYLOPRIM) 100 MG tablet   amLODipine (NORVASC) 10 MG tablet   ANTI-ITCH lotion   ARIPiprazole (ABILIFY) 2 MG tablet   aspirin 81 MG chewable tablet   atorvastatin (LIPITOR) 40 MG tablet   carboxymethylcellulose (REFRESH PLUS) 0.5 % SOLN   clopidogrel (PLAVIX) 75 MG tablet   diphenhydrAMINE (BENADRYL) 25 mg capsule   ertapenem (INVANZ) IVPB   food thickener (THICK IT) POWD   fosfomycin (MONUROL) 3 g PACK   furosemide (LASIX) 20 MG tablet   gabapentin (NEURONTIN) 300 MG capsule   HIBICLENS 4 % external liquid   insulin aspart (NOVOLOG) 100 UNIT/ML injection   labetalol (NORMODYNE) 100 MG tablet   levETIRAcetam (KEPPRA) 500 MG tablet   lisinopril (ZESTRIL) 40 MG tablet   LORazepam (ATIVAN) 2 MG/ML concentrated solution   Maltodextrin-Xanthan Gum (RESOURCE THICKENUP CLEAR) POWD   oxybutynin (DITROPAN-XL) 5 MG 24 hr tablet   pantoprazole (PROTONIX) 40 MG tablet   polyethylene glycol (MIRALAX / GLYCOLAX) 17 g packet   sertraline (ZOLOFT) 50 MG tablet   SYSTANE BALANCE 0.6 % SOLN   Vitamin D, Ergocalciferol, (DRISDOL) 1.25 MG (50000 UNIT) CAPS capsule   History:   Past Medical History:  Diagnosis Date   Acute right MCA stroke ([place]) 04/05/2020   a.) s/p mechanical thrombectomy + stenting (2.5 x 15 mm Resolute Onyx) + TPA   Atrial fibrillation (HCC)    Basal cell carcinoma    Bilateral carotid artery stenosis    a.) CTA neck 93/79/0240: 97-35% LICA   Chronic gingivitis    Chronic systolic (congestive) heart failure (HCC)    a.) TTE 04/06/2020: EF 50-55%, mod LAE, mod AoV sclerosis/calc, triv TR/PR, mild MR, G2DD; b.) TTE  08/29/2021: EF >55%, mod LAE, triv AR/TR/PR. mild MR, G1DD.   Cocaine use    a.) last UDS (+) on 04/05/2020   Constipation    DOE (dyspnea on exertion)    Functional dyspepsia    Gout    Hemorrhoids    History of aspiration pneumonitis    Hypertension    Ileus  (HCC)    Left hemiplegia ([place]) 04/05/2020   a.) s/p RIGHT MCA CVA   Long term current use of antithrombotics/antiplatelets    a.) on DAPT (ASA + clopidogrel)   Major depressive disorder    Muscle weakness    Nephrolithiasis    PVD (peripheral vascular disease) ([place])    Seborrhea capitis    Vitamin D deficiency    Past Surgical History:  Procedure Laterality Date   CYSTOSCOPY/URETEROSCOPY/HOLMIUM LASER/STENT PLACEMENT Left 08/02/2022   Procedure: CYSTOSCOPY/URETEROSCOPY/HOLMIUM LASER/STENT PLACEMENT;  Surgeon: Abbie Sons, MD;  Location: [facility] ORS;  Service: Urology;  Laterality: Left;   IR CT HEAD LTD  04/05/2020   IR FLUORO GUIDE CV LINE RIGHT  08/01/2022   IR INTRA CRAN STENT  04/05/2020   IR PERCUTANEOUS ART THROMBECTOMY/INFUSION INTRACRANIAL INC DIAG ANGIO  04/05/2020       IR PERCUTANEOUS ART THROMBECTOMY/INFUSION INTRACRANIAL INC DIAG ANGIO  04/05/2020   IR US GUIDE VASC ACCESS RIGHT  08/01/2022   RADIOLOGY WITH ANESTHESIA N/A 04/05/2020   Procedure: IR WITH ANESTHESIA;  Surgeon: Radiologist, Medication, MD;  Location: [facility];  Service: Radiology;  Laterality: N/A;   Family History  Problem Relation Age of Onset   Hypertension Mother    Hypertension Father    Social History   Tobacco Use   Smoking status: Every Day   Smokeless tobacco: Never  Substance Use Topics   Alcohol use: Yes    Alcohol/week: 10.0 standard drinks of alcohol    Types: 10 Cans of beer per week    Comment: every DAy   Drug use: Not Currently    Pertinent Clinical Results:  LABS: Labs reviewed: Acceptable for surgery.  Lab Results  Component Value Date   WBC 10.9 (H) 08/06/2022   HGB 10.1 (L) 08/06/2022   HCT 32.0 (L) 08/06/2022   MCV 84.4 08/06/2022   PLT 272 08/06/2022   Lab Results  Component Value Date   NA 135 08/04/2022   K 3.9 08/04/2022   CO2 21 (L) 08/04/2022   GLUCOSE 186 (H) 08/04/2022   BUN 24 (H) 08/04/2022   CREATININE 1.54 (H) 08/04/2022   CALCIUM 8.8 (L) 08/04/2022    GFRNONAA 49 (L) 08/04/2022   Lab Results  Component Value Date   HGBA1C 6.2 (H) 07/23/2022     ECG: Date: 07/23/2022 Time ECG obtained: 1117 AM Rate: 63 bpm Rhythm:  sinus rhythm with prolonged PR interval ; NS IVCD Axis (leads I and aVF): Normal Intervals: PR 239 ms. QRS 115 ms. QTc 478 ms. ST segment and T wave changes: No evidence of acute ST segment elevation or depression. Evidence of old inferior and anterior infarcts. Comparison: Similar to previous tracing obtained on 07/07/2021   IMAGING / PROCEDURES: CT ABDOMEN PELVIS WO CONTRAST performed on 07/23/2022 Findings are concerning for early or partial small bowel obstruction. Surgical consultation is recommended. There is also a large calculus measuring 8 x 9 x 14 mm in the middle third of the left ureter with moderate proximal left hydroureteronephrosis indicating obstruction. Multiple other poorly defined calcifications are present within the left kidney, potentially nonobstructive calculi. However, the possibility of a  partially calcified left renal mass is not excluded. Further evaluation with nonemergent abdominal MRI with and without IV gadolinium should be considered in the near future after resolution of the patient's acute illness to better evaluate these findings and exclude malignancy. Urologic consultation is also suggested. Aortic atherosclerosis, in addition to left main and three-vessel coronary artery disease. Please note that although the presence of coronary artery calcium documents the presence of coronary artery disease, the severity of this disease and any potential stenosis cannot be assessed on this non-gated CT examination. Assessment for potential risk factor modification, dietary therapy or pharmacologic therapy may be warranted, if clinically indicated.  TRANSTHORACIC ECHOCARDIOGRAM performed on 08/29/2021 Normal left ventricular systolic function with an EF of >55% Left ventricular diastolic Doppler  parameters consistent with abnormal relaxation (G1DD). Left atrium moderately enlarged Normal right ventricular systolic function Trivial AR, TR, and PR Mild MR Normal transvalvular gradients; no valvular stenosis No pericardial effusion Technically difficult study  CT ANGIO HEAD W OR WO CONTRAST performed on 04/10/2020 Subacute infarct right MCA territory with decreased cytotoxic edema. No hemorrhage or new infarct. 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 Right MCA stent is patent with good flow to right MCA branches. Moderate stenosis left supraclinoid internal carotid artery due to calcific stenosis. Left middle cerebral artery widely patent. Anterior cerebral arteries patent bilaterally. Severe stenosis left P2 segment. Mild stenosis right posterior cerebral artery.  Impression and Plan:  Rodney Estrada has been referred for pre-anesthesia review and clearance prior to him undergoing the planned anesthetic and procedural courses. Available labs, pertinent testing, and imaging results were personally reviewed by me. This patient has been appropriately cleared by cardiology with an overall LOW risk of significant perioperative cardiovascular complications.  Based on clinical review performed today (08/25/22), barring any significant acute changes in the patient's overall condition, it is anticipated that he will be able to proceed with the planned surgical intervention. Any acute changes in clinical condition may necessitate his procedure being postponed and/or cancelled. Patient will meet with anesthesia team (MD and/or CRNA) on the day of his procedure for preoperative evaluation/assessment. Questions regarding anesthetic course will be fielded at that time.   Pre-surgical instructions were reviewed with the patient during his PAT appointment and questions were fielded by PAT clinical staff. Patient was advised that if any questions  or concerns arise prior to his procedure then he should return a call to PAT and/or his surgeon's office to discuss.  Honor Loh, MSN, APRN, FNP-C, CEN [facility]  Peri-operative Services Nurse Practitioner Phone: [phone] Fax: [phone] 08/25/22 12:33 PM  NOTE: This note has been prepared using Dragon dictation software. Despite my best ability to proofread, there is always the potential that unintentional transcriptional errors may still occur from this process.

## 2022-08-25 NOTE — Progress Notes (Signed)
Pre admit testing instructions given to Joelene Millin the staff nurse at skilled nursing facility of [facility]. No other issues noted and questions answered.

## 2022-08-25 NOTE — Patient Instructions (Signed)
Your procedure is scheduled on: 08/29/2022  Report to the Registration Desk on the 1st floor of the [facility]. To find out your arrival time, please call [phone] between 1PM - 3PM on: YOUR arrival time is no later than 8:00 am  If your arrival time is 6:00 am, do not arrive prior to that time as the [facility] entrance doors do not open until 6:00 am.  REMEMBER: Instructions that are not followed completely may result in serious medical risk, up to and including death; or upon the discretion of your surgeon and anesthesiologist your surgery may need to be rescheduled.  Do not eat food after midnight the night before surgery.  No gum chewing, lozengers or hard candies.   TAKE THESE MEDICATIONS THE MORNING OF SURGERY WITH A SIP OF WATER (Clear) only:  amLODipine (NORVASC)  gabapentin  labetalol - very important levETIRAcetam - very important  pantoprazole  Zoloft   **Follow new guidelines for insulin and diabetes medications.** Give half dose of Bedtime (long acting insulin if applicable)   Follow recommendations from Cardiologist, Pulmonologist or PCP regarding stopping Aspirin, Plavix:                     Hold Plavix 3 days prior to procedure.                      Continue Aspirin until a day prior to procedure.   One week prior to surgery: Stop Anti-inflammatories (NSAIDS) such as Advil, Aleve, Ibuprofen, Motrin, Naproxen, Naprosyn and Aspirin based products such as Excedrin, Goodys Powder, BC Powder. Stop ANY OVER THE COUNTER supplements until after surgery. You may however, continue to take Tylenol if needed for pain up until the day of surgery.  No Alcohol for 24 hours before or after surgery.  No Smoking including e-cigarettes for 24 hours prior to surgery.  No chewable tobacco products for at least 6 hours prior to surgery.  No nicotine patches on the day of surgery.  Do not use any "recreational" drugs for at least a week prior to your surgery.  Please be  advised that the combination of cocaine and anesthesia may have negative outcomes, up to and including death. If you test positive for cocaine, your surgery will be cancelled.  On the morning of surgery brush your teeth with toothpaste and water, you may rinse your mouth with mouthwash if you wish. Do not swallow any toothpaste or mouthwash.  You may shower on day of surgery.  Do not wear jewelry, make-up, hairpins, clips or nail polish.  Do not wear lotions, powders, or perfumes.   Do not shave body from the neck down 48 hours prior to surgery just in case you cut yourself which could leave a site for infection.  Also, freshly shaved skin may become irritated if using the CHG soap.  Contact lenses, hearing aids and dentures may not be worn into surgery.  Do not bring valuables to the hospital. [facility] is not responsible for any missing/lost belongings or valuables.    Notify your doctor if there is any change in your medical condition (cold, fever, infection).  Wear comfortable clothing (specific to your surgery type) to the hospital.  After surgery, you can help prevent lung complications by doing breathing exercises.  Take deep breaths and cough every 1-2 hours. Your doctor may order a device called an Incentive Spirometer to help you take deep breaths.  If you are being admitted to the  hospital overnight, leave your suitcase in the car. After surgery it may be brought to your room.  If you are being discharged the day of surgery, you will not be allowed to drive home. You will need a responsible adult (18 years or older) to drive you home and stay with you that night.   If you are taking public transportation, you will need to have a responsible adult (18 years or older) with you. Please confirm with your physician that it is acceptable to use public transportation.   Please call the [facility] Dept. at [phone] if you have any questions about these  instructions.  PLEASE SEND a copy of medical advance directives like HCPOA and Living Wills.  Surgery Visitation Policy:  Patients undergoing a surgery or procedure may have two family members or support persons with them as long as the person is not COVID-19 positive or experiencing its symptoms.   Inpatient Visitation:    Visiting hours are 7 a.m. to 8 p.m. Up to four visitors are allowed at one time in a patient room. The visitors may rotate out with other people during the day. One designated support person (adult) may remain overnight.  MASKING: Due to an increase in RSV rates and hospitalizations, in-patient care areas in which we serve newborns, infants and children, masks will be required for teammates and visitors.  Children ages 16 and under may not visit. This policy affects the following departments only:  [facility] area Mother Baby Unit Newborn nursery/Special care nursery  Other areas: Masks continue to be strongly recommended for [place] teammates, visitors and patients in all other areas. Visitation is not restricted outside of the units listed above.

## 2022-08-28 MED ORDER — GENTAMICIN SULFATE 40 MG/ML IJ SOLN
5.0000 mg/kg | INTRAVENOUS | Status: AC
  Administered 2022-08-29: 430 mg via INTRAVENOUS
  Filled 2022-08-28: qty 10.75

## 2022-08-28 MED ORDER — SODIUM CHLORIDE 0.9 % IV SOLN: INTRAVENOUS | Status: DC

## 2022-08-28 MED ORDER — ORAL CARE MOUTH RINSE: 15.0000 mL | Freq: Once | OROMUCOSAL | Status: AC

## 2022-08-28 MED ORDER — CHLORHEXIDINE GLUCONATE 0.12 % MT SOLN
15.0000 mL | Freq: Once | OROMUCOSAL | Status: AC
  Administered 2022-08-29: 15 mL via OROMUCOSAL

## 2022-08-29 ENCOUNTER — Ambulatory Visit
Admission: RE | Admit: 2022-08-29 | Discharge: 2022-08-29 | Disposition: A | Discharge status: Skilled Nursing Facility | Payer: Medicare Other | Source: Ambulatory Visit | Attending: Urology | Admitting: Urology

## 2022-08-29 ENCOUNTER — Encounter: Payer: Self-pay | Admitting: Urology

## 2022-08-29 ENCOUNTER — Ambulatory Visit: Payer: Medicare Other | Admitting: Urgent Care

## 2022-08-29 ENCOUNTER — Encounter
Admission: RE | Disposition: A | Discharge status: Skilled Nursing Facility | Payer: Self-pay | Source: Ambulatory Visit | Attending: Urology

## 2022-08-29 ENCOUNTER — Other Ambulatory Visit: Payer: Self-pay

## 2022-08-29 ENCOUNTER — Ambulatory Visit: Payer: Medicare Other

## 2022-08-29 ENCOUNTER — Other Ambulatory Visit: Payer: Self-pay | Admitting: Urology

## 2022-08-29 DIAGNOSIS — I2510 Atherosclerotic heart disease of native coronary artery without angina pectoris: Secondary | ICD-10-CM | POA: Insufficient documentation

## 2022-08-29 DIAGNOSIS — Z6827 Body mass index (BMI) 27.0-27.9, adult: Secondary | ICD-10-CM | POA: Insufficient documentation

## 2022-08-29 DIAGNOSIS — E669 Obesity, unspecified: Secondary | ICD-10-CM | POA: Diagnosis not present

## 2022-08-29 DIAGNOSIS — N1339 Other hydronephrosis: Secondary | ICD-10-CM

## 2022-08-29 DIAGNOSIS — N183 Chronic kidney disease, stage 3 unspecified: Secondary | ICD-10-CM | POA: Insufficient documentation

## 2022-08-29 DIAGNOSIS — N1330 Unspecified hydronephrosis: Secondary | ICD-10-CM

## 2022-08-29 DIAGNOSIS — N261 Atrophy of kidney (terminal): Secondary | ICD-10-CM

## 2022-08-29 DIAGNOSIS — Z794 Long term (current) use of insulin: Secondary | ICD-10-CM | POA: Diagnosis not present

## 2022-08-29 DIAGNOSIS — N201 Calculus of ureter: Secondary | ICD-10-CM | POA: Insufficient documentation

## 2022-08-29 DIAGNOSIS — I63511 Cerebral infarction due to unspecified occlusion or stenosis of right middle cerebral artery: Secondary | ICD-10-CM

## 2022-08-29 DIAGNOSIS — I69354 Hemiplegia and hemiparesis following cerebral infarction affecting left non-dominant side: Secondary | ICD-10-CM | POA: Insufficient documentation

## 2022-08-29 DIAGNOSIS — I130 Hypertensive heart and chronic kidney disease with heart failure and stage 1 through stage 4 chronic kidney disease, or unspecified chronic kidney disease: Secondary | ICD-10-CM | POA: Insufficient documentation

## 2022-08-29 DIAGNOSIS — E1122 Type 2 diabetes mellitus with diabetic chronic kidney disease: Secondary | ICD-10-CM | POA: Diagnosis not present

## 2022-08-29 DIAGNOSIS — I509 Heart failure, unspecified: Secondary | ICD-10-CM | POA: Diagnosis not present

## 2022-08-29 DIAGNOSIS — Z87891 Personal history of nicotine dependence: Secondary | ICD-10-CM | POA: Diagnosis not present

## 2022-08-29 HISTORY — DX: Atherosclerosis of aorta: I70.0

## 2022-08-29 HISTORY — DX: Unspecified atrial fibrillation: I48.91

## 2022-08-29 HISTORY — DX: Peripheral vascular disease, unspecified: I73.9

## 2022-08-29 HISTORY — DX: Other forms of dyspnea: R06.09

## 2022-08-29 HISTORY — DX: Long term (current) use of antithrombotics/antiplatelets: Z79.02

## 2022-08-29 HISTORY — DX: Hyperlipidemia, unspecified: E78.5

## 2022-08-29 HISTORY — DX: Occlusion and stenosis of bilateral carotid arteries: I65.23

## 2022-08-29 HISTORY — DX: Alcohol abuse, in remission: F10.11

## 2022-08-29 HISTORY — DX: Vitamin D deficiency, unspecified: E55.9

## 2022-08-29 HISTORY — DX: Type 2 diabetes mellitus without complications: E11.9

## 2022-08-29 HISTORY — DX: Ileus, unspecified: K56.7

## 2022-08-29 HISTORY — DX: Chronic systolic (congestive) heart failure: I50.22

## 2022-08-29 HISTORY — DX: Cocaine use, unspecified, uncomplicated: F14.90

## 2022-08-29 HISTORY — DX: Personal history of other diseases of the respiratory system: Z87.09

## 2022-08-29 HISTORY — PX: CYSTOSCOPY/URETEROSCOPY/HOLMIUM LASER/STENT PLACEMENT: SHX6546

## 2022-08-29 HISTORY — DX: Atherosclerotic heart disease of native coronary artery without angina pectoris: I25.10

## 2022-08-29 HISTORY — DX: Calculus of kidney: N20.0

## 2022-08-29 LAB — GLUCOSE, CAPILLARY
Glucose-Capillary: 120 mg/dL — ABNORMAL HIGH (ref 70–99)
Glucose-Capillary: 119 mg/dL — ABNORMAL HIGH (ref 70–99)

## 2022-08-29 SURGERY — CYSTOSCOPY/URETEROSCOPY/HOLMIUM LASER/STENT PLACEMENT
Anesthesia: General | Site: Ureter | Laterality: Left

## 2022-08-29 MED ORDER — PROPOFOL 10 MG/ML IV BOLUS
INTRAVENOUS | Status: DC | PRN
  Administered 2022-08-29: 150 mg via INTRAVENOUS
  Administered 2022-08-29: 50 mg via INTRAVENOUS

## 2022-08-29 MED ORDER — EPHEDRINE SULFATE (PRESSORS) 50 MG/ML IJ SOLN
INTRAMUSCULAR | Status: DC | PRN
  Administered 2022-08-29: 5 mg via INTRAVENOUS
  Administered 2022-08-29 (×2): 10 mg via INTRAVENOUS

## 2022-08-29 MED ORDER — FENTANYL CITRATE (PF) 100 MCG/2ML IJ SOLN
INTRAMUSCULAR | Status: DC | PRN
  Administered 2022-08-29 (×2): 25 ug via INTRAVENOUS
  Administered 2022-08-29: 50 ug via INTRAVENOUS

## 2022-08-29 MED ORDER — MIDAZOLAM HCL 5 MG/5ML IJ SOLN
INTRAMUSCULAR | Status: DC | PRN
  Administered 2022-08-29: 2 mg via INTRAVENOUS

## 2022-08-29 MED ORDER — ACETAMINOPHEN 10 MG/ML IV SOLN
INTRAVENOUS | Status: DC | PRN
  Administered 2022-08-29: 1000 mg via INTRAVENOUS

## 2022-08-29 MED ORDER — FLUMAZENIL 0.5 MG/5ML IV SOLN
INTRAVENOUS | Status: AC
  Filled 2022-08-29: qty 5

## 2022-08-29 MED ORDER — PHENYLEPHRINE HCL (PRESSORS) 10 MG/ML IV SOLN
INTRAVENOUS | Status: DC | PRN
  Administered 2022-08-29: 160 ug via INTRAVENOUS
  Administered 2022-08-29 (×3): 80 ug via INTRAVENOUS

## 2022-08-29 MED ORDER — MIDAZOLAM HCL 2 MG/2ML IJ SOLN
INTRAMUSCULAR | Status: AC
  Filled 2022-08-29: qty 2

## 2022-08-29 MED ORDER — ONDANSETRON HCL 4 MG/2ML IJ SOLN
INTRAMUSCULAR | Status: DC | PRN
  Administered 2022-08-29: 4 mg via INTRAVENOUS

## 2022-08-29 MED ORDER — FENTANYL CITRATE (PF) 100 MCG/2ML IJ SOLN
INTRAMUSCULAR | Status: AC
  Filled 2022-08-29: qty 2

## 2022-08-29 MED ORDER — ACETAMINOPHEN 10 MG/ML IV SOLN
INTRAVENOUS | Status: AC
  Filled 2022-08-29: qty 100

## 2022-08-29 MED ORDER — NALOXONE HCL 2 MG/2ML IJ SOSY
PREFILLED_SYRINGE | INTRAMUSCULAR | Status: AC
  Filled 2022-08-29: qty 2

## 2022-08-29 MED ORDER — DEXAMETHASONE SODIUM PHOSPHATE 10 MG/ML IJ SOLN
INTRAMUSCULAR | Status: DC | PRN
  Administered 2022-08-29: 10 mg via INTRAVENOUS

## 2022-08-29 MED ORDER — IOHEXOL 180 MG/ML  SOLN
INTRAMUSCULAR | Status: DC | PRN
  Administered 2022-08-29: 40 mL

## 2022-08-29 MED ORDER — OXYCODONE HCL 5 MG/5ML PO SOLN: 5.0000 mg | Freq: Once | ORAL | Status: DC | PRN

## 2022-08-29 MED ORDER — GLYCOPYRROLATE 0.2 MG/ML IJ SOLN
INTRAMUSCULAR | Status: DC | PRN
  Administered 2022-08-29: .2 mg via INTRAVENOUS

## 2022-08-29 MED ORDER — PENTAFLUOROPROP-TETRAFLUOROETH EX AERO
INHALATION_SPRAY | CUTANEOUS | Status: AC
  Filled 2022-08-29: qty 30

## 2022-08-29 MED ORDER — SODIUM CHLORIDE 0.9 % IR SOLN
Status: DC | PRN
  Administered 2022-08-29: 3000 mL via INTRAVESICAL

## 2022-08-29 MED ORDER — LIDOCAINE HCL (CARDIAC) PF 100 MG/5ML IV SOSY
PREFILLED_SYRINGE | INTRAVENOUS | Status: DC | PRN
  Administered 2022-08-29: 80 mg via INTRAVENOUS

## 2022-08-29 MED ORDER — FENTANYL CITRATE (PF) 100 MCG/2ML IJ SOLN: 25.0000 ug | INTRAMUSCULAR | Status: DC | PRN

## 2022-08-29 MED ORDER — PROPOFOL 10 MG/ML IV BOLUS
INTRAVENOUS | Status: AC
  Filled 2022-08-29: qty 20

## 2022-08-29 MED ORDER — OXYCODONE HCL 5 MG PO TABS: 5.0000 mg | ORAL_TABLET | Freq: Once | ORAL | Status: DC | PRN

## 2022-08-29 SURGICAL SUPPLY — 30 items
BAG DRAIN SIEMENS DORNER NS (MISCELLANEOUS) ×1 IMPLANT
BAG URO DRAIN 4000ML (MISCELLANEOUS) IMPLANT
BASKET LASER NITINOL 1.9FR (BASKET) IMPLANT
BASKET ZERO TIP 1.9FR (BASKET) IMPLANT
BRUSH SCRUB EZ 1% IODOPHOR (MISCELLANEOUS) ×1 IMPLANT
CATH FOL 2WAY LX 16X5 (CATHETERS) IMPLANT
CATH URET FLEX-TIP 2 LUMEN 10F (CATHETERS) IMPLANT
CATH URETL OPEN END 6X70 (CATHETERS) IMPLANT
CNTNR SPEC 2.5X3XGRAD LEK (MISCELLANEOUS)
CONT SPEC 4OZ STRL OR WHT (MISCELLANEOUS)
CONTAINER SPEC 2.5X3XGRAD LEK (MISCELLANEOUS) IMPLANT
DRAPE UTILITY 15X26 TOWEL STRL (DRAPES) ×1 IMPLANT
FIBER LASER MOSES 200 DFL (Laser) IMPLANT
GLOVE SURG UNDER POLY LF SZ7.5 (GLOVE) ×1 IMPLANT
GOWN STRL REUS W/ TWL LRG LVL3 (GOWN DISPOSABLE) ×1 IMPLANT
GOWN STRL REUS W/ TWL XL LVL3 (GOWN DISPOSABLE) ×1 IMPLANT
GOWN STRL REUS W/TWL LRG LVL3 (GOWN DISPOSABLE) ×1
GOWN STRL REUS W/TWL XL LVL3 (GOWN DISPOSABLE) ×1
GUIDEWIRE STR DUAL SENSOR (WIRE) ×1 IMPLANT
IV NS IRRIG 3000ML ARTHROMATIC (IV SOLUTION) ×1 IMPLANT
KIT TURNOVER CYSTO (KITS) ×1 IMPLANT
PACK CYSTO AR (MISCELLANEOUS) ×1 IMPLANT
SET CYSTO W/LG BORE CLAMP LF (SET/KITS/TRAYS/PACK) ×1 IMPLANT
SHEATH NAVIGATOR HD 12/14X36 (SHEATH) IMPLANT
STENT URET 6FRX24 CONTOUR (STENTS) IMPLANT
STENT URET 6FRX26 CONTOUR (STENTS) IMPLANT
SURGILUBE 2OZ TUBE FLIPTOP (MISCELLANEOUS) ×1 IMPLANT
TRAP FLUID SMOKE EVACUATOR (MISCELLANEOUS) ×1 IMPLANT
VALVE UROSEAL ADJ ENDO (VALVE) IMPLANT
WATER STERILE IRR 500ML POUR (IV SOLUTION) ×1 IMPLANT

## 2022-08-29 NOTE — Op Note (Signed)
   Preoperative diagnosis: Left proximal ureteral calculus  Postoperative diagnosis: Left proximal ureteral calculus  Procedure:  Cystoscopy Left ureteroscopy and stone removal Ureteroscopic laser lithotripsy Left ureteral stent exchange (62F/26 cm)  Left retrograde pyelography with interpretation  Surgeon: Nicki Reaper C. Nickole Adamek, M.D.  Anesthesia: General  Complications: None  Intraoperative findings:  Cystoscopy-urethra normal in caliber without stricture. Prostate mild lateral lobe enlargement with moderate bladder neck elevation. Bladder with moderate trabeculation and no solid or papillary lesions.  Inflammatory changes left hemitrigone secondary to indwelling stent  EBL: Minimal  Specimens: None   Indication: Rodney Estrada is a 71 y.o. male with a prior history of CVA residing at a skilled nursing facility.  He was admitted last month with a small bowel obstruction and CT showed severe left hydronephrosis secondary to a lower proximal ureteral calculus.  He had significant renal atrophy.  Prior imaging from 2021 showed severe left hydronephrosis to the same area.  He underwent left ureteral stent placement 08/02/2022 secondary to possible sepsis.  The stone was impacted in the required ureteroscopy and partial fragmentation in order to place the stent.  He presents today for follow-up ureteroscopy.  Urine culture was negative.  After reviewing the management options for treatment, the patient elected to proceed with the above surgical procedure(s). We have discussed the potential benefits and risks of the procedure, side effects of the proposed treatment, the likelihood of the patient achieving the goals of the procedure, and any potential problems that might occur during the procedure or recuperation. Informed consent has been obtained.  Description of procedure:  The patient was taken to the operating room and general anesthesia was induced.  The patient was placed in the dorsal  lithotomy position, prepped and draped in the usual sterile fashion, and preoperative antibiotics were administered. A preoperative time-out was performed.   A 21 French cystoscope was lubricated, inserted per urethra and advanced proximally under direct vision with findings as described above.  The indwelling stent was grasped with endoscopic forceps and brought out through the urethral meatus.  A 0.038 Sensor wire was then placed through the stent and advanced into the renal pelvis on fluoroscopic guidance.  The ureteral stent was then removed.  A 4.5 Fr semirigid ureteroscope was then advanced into the ureter next to the guidewire and advanced proximally to the lower mid ureter where the calculus was identified.  Impacted residual stone was noted on the medial and lateral walls of the ureter.  The remaining calculus was then fragmented with a 200 m Moses holmium laser fiber at a setting of 0.3J/40 Hz.  Some adherent fragments were able to be removed with an Escape basket.  All visible fragments were removed after adequate fragmentation.  The ureteroscope was able to be advanced to the UPJ and no fragments were identified.  Retrograde pyelogram was then performed which showed no renal calculi and moderate-severe hydronephrosis  A 62F/26 cm Contour ureteral stent was placed under fluoroscopic guidance.  The wire was then removed with an adequate stent curl noted in the renal pelvis as well as in the bladder.  He had an indwelling Foley catheter which was replaced (16 French-10 mL sterile water in the balloon).  The patient appeared to tolerate the procedure well and without complications.  After anesthetic reversal the patient was transported to the PACU in stable condition.   Plan: Schedule renal scan for differential function approximately 2 weeks Will leave ureteral stent indwelling approximately 3 weeks   Shawna Giovanni, MD

## 2022-08-29 NOTE — Progress Notes (Signed)
Patient more awake/alert. Asking appropriate questions. Sbp greater then 100  OK for discharge per Dr. Barbra Sarks.

## 2022-08-29 NOTE — Transfer of Care (Signed)
Immediate Anesthesia Transfer of Care Note  Patient: Rodney Estrada  Procedure(s) Performed: CYSTOSCOPY/URETEROSCOPY/HOLMIUM LASER/STENT EXCHANGE (Left: Ureter)  Patient Location: PACU  Anesthesia Type:General  Level of Consciousness: sedated and responds to stimulation  Airway & Oxygen Therapy: Patient Spontanous Breathing  Post-op Assessment: Report given to RN  Post vital signs: Reviewed  Last Vitals:  Vitals Value Taken Time  BP 105/62 08/29/22 1334  Temp    Pulse 63 08/29/22 1336  Resp 21 08/29/22 1336  SpO2 100 % 08/29/22 1336  Vitals shown include unvalidated device data.  Last Pain: There were no vitals filed for this visit.       Complications: No notable events documented.

## 2022-08-29 NOTE — Progress Notes (Signed)
Upon arrival to pacu, patient hypotensive sbp 60-70's, CRNA at bedside, medications administered.  Dr. Barbra Sarks at bedside, administered 0.'1mg'$  flumazenil.  Sbp 80's. Patient lethargic. Dr. Barbra Sarks administered 156mq naloxone IV. Patient became more awake, answering simple questions, states no pain, aware of immediate surroundings. Will continue to monitor closely.

## 2022-08-29 NOTE — Anesthesia Preprocedure Evaluation (Signed)
Anesthesia Evaluation  Patient identified by MRN, date of birth, ID band Patient awake    Reviewed: Allergy & Precautions, NPO status , Patient's Chart, lab work & pertinent test results  History of Anesthesia Complications Negative for: history of anesthetic complications  Airway Mallampati: IV   Neck ROM: Full    Dental  (+) Missing, Dental Advidsory Given, Poor Dentition   Pulmonary neg COPD, Current Smoker, former smoker (quit 2020)   Pulmonary exam normal breath sounds clear to auscultation       Cardiovascular hypertension, + CAD (coronary artery aneurysm) and +CHF  Normal cardiovascular exam(-) dysrhythmias  Rhythm:Regular Rate:Normal  ECG 07/23/22:  Sinus or ectopic atrial rhythm Prolonged PR interval Nonspecific IVCD with LAD Inferior infarct, old Anterior infarct, old  Echo 08/29/21:  NORMAL LEFT VENTRICULAR SYSTOLIC FUNCTION  NORMAL RIGHT VENTRICULAR SYSTOLIC FUNCTION  MILD VALVULAR REGURGITATION  NO VALVULAR STENOSIS  TECHNICALLY DIFFICULT STUDY     Neuro/Psych Seizures -,  PSYCHIATRIC DISORDERS  Depression    CVA (left hemiplegia)    GI/Hepatic ,GERD  Controlled and Medicated,,Recurrent SBO   Endo/Other  diabetes, Type 2  Obesity   Renal/GU Renal disease (nephrolithiasis, stage III CKD)     Musculoskeletal   Abdominal   Peds  Hematology  (+) Blood dyscrasia, anemia   Anesthesia Other Findings Cardiology note 09/27/21:  Plan -Continue current medical regimen of anti-platelet medication and management of coronary artery disease, hypertension, hyperlipidemia, diabetes, carotid atherosclerosis and cerebrovascular disease without change today to reduce future morbidity and mortality. The patient understands future goals of treatment. We have had a long discussion of peripheral vascular disease as well as carotid atherosclerosis and the future risks of morbidity and mortality. The patient will continue  to reduce cardiovascular risk factors to the most significant degree possible and will continue anti-platelet therapy -There has been a discussion of the current guidelines for hypertension control. We will continue current medical regimen for hypertension control which will also help in risk factor modification of cardiovascular disease. The patient understands and agrees with the current plan. We will be watching for possible future side effects of these medications. Additional home blood pressure monitoring is recommended if able. -No further intervention shortness of breath reported above multifactorial in nature including age, decreased exercise tolerance, disability, and/or medication management. The patient is to follow for any worsening symptoms or increase in severity for further in need in investigation or treatment options. -We have discussed risk reduction in the cardiovascular disease process by a continuation of lipid management with the current medication management for lipid reduction. The goals continue to be 30-50% lowering of LDL cholesterol in addition to lifestyle measures. This will include diet and improved activity level on a regular basis.The patient has an understanding of this discussion at this time and we will continue the appropriate strategy. -We have had a long discussion about the benefits of physical and occupational rehabilitation. The patient is advised and encouraged to enroll for improvements in quality of life and reduced hospitalization.  No orders of the defined types were placed in this encounter.  Return in about 1 year (around 09/27/2022).    Reproductive/Obstetrics                             Anesthesia Physical Anesthesia Plan  ASA: 3  Anesthesia Plan: General   Post-op Pain Management:    Induction: Intravenous  PONV Risk Score and Plan: 1 and Ondansetron, Dexamethasone and Treatment  may vary due to age or medical  condition  Airway Management Planned: Oral ETT  Additional Equipment:   Intra-op Plan:   Post-operative Plan: Extubation in OR  Informed Consent: I have reviewed the patients History and Physical, chart, labs and discussed the procedure including the risks, benefits and alternatives for the proposed anesthesia with the patient or authorized representative who has indicated his/her understanding and acceptance.     Dental advisory given  Plan Discussed with: CRNA  Anesthesia Plan Comments: (Patient consented for risks of anesthesia including but not limited to:  - adverse reactions to medications - damage to eyes, teeth, lips or other oral mucosa - nerve damage due to positioning  - sore throat or hoarseness - damage to heart, brain, nerves, lungs, other parts of body or loss of life  Informed patient about role of CRNA in peri- and intra-operative care.  Patient voiced understanding.)       Anesthesia Quick Evaluation

## 2022-08-29 NOTE — Anesthesia Procedure Notes (Signed)
Procedure Name: LMA Insertion Date/Time: 08/29/2022 11:18 AM  Performed by: Hedda Slade, CRNAPre-anesthesia Checklist: Patient identified, Patient being monitored, Timeout performed, Emergency Drugs available and Suction available Patient Re-evaluated:Patient Re-evaluated prior to induction Oxygen Delivery Method: Circle system utilized Preoxygenation: Pre-oxygenation with 100% oxygen Induction Type: IV induction Ventilation: Mask ventilation without difficulty LMA: LMA inserted LMA Size: 5.0 Tube type: Oral Number of attempts: 1 Placement Confirmation: positive ETCO2 and breath sounds checked- equal and bilateral Tube secured with: Tape Dental Injury: Teeth and Oropharynx as per pre-operative assessment

## 2022-08-29 NOTE — Anesthesia Postprocedure Evaluation (Signed)
Anesthesia Post Note  Patient: Rodney Estrada  Procedure(s) Performed: CYSTOSCOPY/URETEROSCOPY/HOLMIUM LASER/STENT EXCHANGE (Left: Ureter)  Patient location during evaluation: PACU Anesthesia Type: General Level of consciousness: awake and alert Pain management: pain level controlled Vital Signs Assessment: post-procedure vital signs reviewed and stable Respiratory status: spontaneous breathing, nonlabored ventilation, respiratory function stable and patient connected to nasal cannula oxygen Cardiovascular status: blood pressure returned to baseline and stable Postop Assessment: no apparent nausea or vomiting Anesthetic complications: no  No notable events documented.   Last Vitals:  Vitals:   08/29/22 1345 08/29/22 1421  BP: 115/62 103/66  Pulse: 64 66  Resp: 10 12  Temp: 36.4 C (!) 36.2 C  SpO2: 99% 100%    Last Pain:  Vitals:   08/29/22 1421  TempSrc: Temporal  PainSc: 0-No pain                 Dimas Millin

## 2022-08-29 NOTE — Interval H&P Note (Signed)
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  Underwent stent placement 08/02/2022 however required ureteroscopy due to impacted stone and partial fragmentation in order to place a stent.  Urine proximal to the stone was grossly infected/purulent.  He presents today for follow-up ureteroscopy with laser lithotripsy.  Urine culture preoperatively was negative.  CV: RRR Lungs: Clear  08/29/2022 11:09 AM  Rodney Estrada  has presented today for surgery, with the diagnosis of Left ureteral stone.  The various methods of treatment have been discussed with the patient and family. After consideration of risks, benefits and other options for treatment, the patient has consented to  Procedure(s): CYSTOSCOPY/URETEROSCOPY/HOLMIUM LASER/STENT EXCHANGE (Left) as a surgical intervention.  The patient's history has been reviewed, patient examined, no change in status, stable for surgery.  I have reviewed the patient's chart and labs.  Questions were answered to the patient's satisfaction.     [facility]

## 2022-08-29 NOTE — Discharge Instructions (Addendum)
DISCHARGE INSTRUCTIONS FOR KIDNEY STONE/URETERAL STENT   MEDICATIONS:  1. Resume all your other meds  2.  May resume Plavix   ACTIVITY:  1. May resume regular activities in 24 hours.   SIGNS/SYMPTOMS TO CALL:  Common postoperative symptoms include urinary frequency, urgency, bladder spasm and blood in the urine  Please call us if you have a fever greater than 101.5, uncontrolled nausea/vomiting, uncontrolled pain, dizziness, unable to urinate, excessively bloody urine, chest pain, shortness of breath, leg swelling, leg pain, or any other concerns or questions.   You can reach [place] at [phone].   FOLLOW-UP:  1.  You will be contacted for a follow-up appointment for renal scan and stent removal AMBULATORY SURGERY  DISCHARGE INSTRUCTIONS   The drugs that you were given will stay in your system until tomorrow so for the next 24 hours you should not:  Drive an automobile Make any legal decisions Drink any alcoholic beverage   You may resume regular meals tomorrow.  Today it is better to start with liquids and gradually work up to solid foods.  You may eat anything you prefer, but it is better to start with liquids, then soup and crackers, and gradually work up to solid foods.   Please notify your doctor immediately if you have any unusual bleeding, trouble breathing, redness and pain at the surgery site, drainage, fever, or pain not relieved by medication.    Your post-operative visit with Dr.                                       is: Date:                        Time:    Please call to schedule your post-operative visit.  Additional Instructions:

## 2022-08-30 ENCOUNTER — Encounter: Payer: Self-pay | Admitting: Urology

## 2022-08-30 ENCOUNTER — Telehealth: Payer: Self-pay | Admitting: Urology

## 2022-08-30 NOTE — Telephone Encounter (Signed)
Pt's sister, Jacqlyn Larsen, returned your call after hours last night.  You can reach her at [phone]

## 2022-09-01 ENCOUNTER — Telehealth: Payer: Self-pay

## 2022-09-01 NOTE — Telephone Encounter (Signed)
[facility] w/[facility] returned call [phone]

## 2022-09-01 NOTE — Telephone Encounter (Signed)
Joelene Millin from [facility] called to inform Rodney Estrada patient was having nausea and vomited 1 time yesterday. He had surgery on 08/29/2022, there was no fever and only a light pink tinged urine but the urine has cleared up today. He has not had anymore vomiting. The doctor in the facility has him on Zofran for the next 3 days to be sure the nausea does not come back. Patient's daughter recommended Joelene Millin call Rodney Estrada and let Rodney Estrada know. I informed that if patient started running a fever greater than 101 to let Rodney Estrada know.

## 2022-09-01 NOTE — Telephone Encounter (Signed)
Voicemail left on triage line from [facility] with [facility].   Called [place] back no answer.

## 2022-09-04 NOTE — Progress Notes (Signed)
Spoke with facility, patient scheduled for both.

## 2022-09-13 ENCOUNTER — Encounter
Admission: RE | Admit: 2022-09-13 | Discharge: 2022-09-13 | Disposition: A | Discharge status: Home / Self Care | Payer: Medicare Other | Source: Ambulatory Visit | Attending: Urology | Admitting: Urology

## 2022-09-13 DIAGNOSIS — N1339 Other hydronephrosis: Secondary | ICD-10-CM | POA: Diagnosis present

## 2022-09-13 MED ORDER — TECHNETIUM TC 99M MERTIATIDE
5.0000 | Freq: Once | INTRAVENOUS | Status: AC
  Administered 2022-09-13: 5.2 via INTRAVENOUS

## 2022-09-29 ENCOUNTER — Ambulatory Visit (INDEPENDENT_AMBULATORY_CARE_PROVIDER_SITE_OTHER): Payer: Medicare Other | Admitting: Urology

## 2022-09-29 DIAGNOSIS — N201 Calculus of ureter: Secondary | ICD-10-CM

## 2022-09-29 DIAGNOSIS — N1330 Unspecified hydronephrosis: Secondary | ICD-10-CM

## 2022-09-29 LAB — URINALYSIS, COMPLETE
Bilirubin, UA: NEGATIVE
Glucose, UA: NEGATIVE
Ketones, UA: NEGATIVE
Nitrite, UA: NEGATIVE
Specific Gravity, UA: 1.025 (ref 1.005–1.030)
Urobilinogen, Ur: 0.2 mg/dL (ref 0.2–1.0)
pH, UA: 5 (ref 5.0–7.5)

## 2022-09-29 LAB — MICROSCOPIC EXAMINATION
RBC, Urine: >30 /hpf — AB (ref 0–2)
WBC, UA: >30 /hpf — AB (ref 0–5)

## 2022-09-29 NOTE — Progress Notes (Signed)
Patient was scheduled for stent removal today however still has an indwelling Foley and normally has a condom catheter.  History of ESBL E. coli.  His catheter was plugged and urine was sent for culture and stent removal will be rescheduled.  Also recommended discontinuing Foley.  Depending on urine culture he may need stent removal in same-day surgery with IV antibiotic prophylaxis

## 2022-10-03 LAB — CULTURE, URINE COMPREHENSIVE

## 2022-10-06 ENCOUNTER — Telehealth: Payer: Self-pay | Admitting: Family Medicine

## 2022-10-06 NOTE — Telephone Encounter (Signed)
-----  Message from Abbie Sons, MD sent at 10/06/2022  7:59 AM EST ----- Urine culture showed no bacteria.  Will schedule cystoscopy with stent removal at [facility] with IV antibiotics prior to stent removal

## 2022-10-06 NOTE — Telephone Encounter (Signed)
Spoke to [facility] at [facility] and notified her that patient does not need ABX at this time. We will contact her to get him set up for stent removal at hospital.

## 2022-11-06 ENCOUNTER — Other Ambulatory Visit: Payer: Self-pay | Admitting: Urology

## 2022-11-06 DIAGNOSIS — Z960 Presence of urogenital implants: Secondary | ICD-10-CM

## 2022-11-06 NOTE — Progress Notes (Unsigned)
Surgical Physician Order Form [facility]  * Scheduling expectation :  2/20  *Length of Case: 30 min  *Clearance needed: no  *Anticoagulation Instructions: May continue all anticoagulants  *Aspirin Instructions: Ok to continue all  *Post-op visit Date/Instructions:   TBD  *Diagnosis:  Indwelling ureteral stent  *Procedure: Cystoscopy with removal left ureteral stent   Additional orders: N/A  -Admit type: OUTpatient  -Anesthesia: MAC  -VTE Prophylaxis Standing Order SCD's       Other:   -Standing Lab Orders Per Anesthesia    Lab other: None  -Standing Test orders EKG/Chest x-ray per Anesthesia       Test other:   - Medications:  Gentamicin per pharmacy  -Other orders:  N/A

## 2022-11-07 ENCOUNTER — Telehealth: Payer: Self-pay

## 2022-11-07 NOTE — Telephone Encounter (Signed)
I spoke with Mr Rodney Estrada, as well [employer]. We have discussed possible surgery dates and Tuesday February 20th, 2024  was agreed upon by all parties. Patient given information about surgery date, what to expect pre-operatively and post operatively.  We discussed that a Pre-Admission Testing office will be calling to set up the pre-op visit that will take place prior to surgery, and that these appointments are typically done over the phone with a Pre-Admissions RN. Informed patient that our office will communicate any additional care to be provided after surgery. Patients questions or concerns were discussed during our call. Advised to call our office should there be any additional information, questions or concerns that arise. Patient verbalized understanding.

## 2022-11-07 NOTE — Progress Notes (Signed)
   [facility] From  Surgery Date: Date: 11/14/2022  Surgeon: Dr. Elva Giovanni, MD  Inpt ( No  )   Outpt (Yes)   Obs ( No  )   Diagnosis: Z96.0 Ureteral Stent Retained  -CPT: 52310  Surgery: Cystoscopy with Removal of Left Ureteral Stent  Stop Anticoagulations: No, may continue all  Cardiac/Medical/Pulmonary Clearance needed: no  *Orders entered into EPIC  Date: 11/07/22   *Case booked in [place]  Date: 11/07/22  *Notified pt of Surgery: Date: 11/07/22  PRE-OP UA & CX: no  *Placed into Prior Authorization Work [place] Date: 11/07/22  Assistant/laser/rep:No

## 2022-11-09 ENCOUNTER — Encounter
Admission: RE | Admit: 2022-11-09 | Discharge: 2022-11-09 | Disposition: A | Discharge status: Home / Self Care | Payer: Medicare Other | Source: Ambulatory Visit | Attending: Urology | Admitting: Urology

## 2022-11-09 DIAGNOSIS — E119 Type 2 diabetes mellitus without complications: Secondary | ICD-10-CM

## 2022-11-09 NOTE — Patient Instructions (Signed)
Your procedure is scheduled on: Tuesday 11/14/22 Report to the Registration Desk on the 1st floor of the [facility]. To find out your arrival time, please call [phone] between 1PM - 3PM on Monday 11/13/22: If your arrival time is 6:00 am, do not arrive before that time as the [facility] entrance doors do not open until 6:00 am.  REMEMBER: Instructions that are not followed completely may result in serious medical risk, up to and including death; or upon the discretion of your surgeon and anesthesiologist your surgery may need to be rescheduled.  Do not eat food after midnight the night before surgery.  No gum chewing or hard candies.    One week prior to surgery: Stop Anti-inflammatories (NSAIDS) such as Advil, Aleve, Ibuprofen, Motrin, Naproxen, Naprosyn and Goody's Powder, BC Powder. Stop ANY OVER THE COUNTER supplements until after surgery. You may however, continue to take Tylenol if needed for pain up until the day of surgery.  Continue taking all prescribed medications. Continue Plavix and Aspirin daily except on the day of your procedure   TAKE ONLY THESE MEDICATIONS THE MORNING OF SURGERY WITH A SIP OF WATER:  allopurinol (ZYLOPRIM) 100 MG tablet  amLODipine (NORVASC) 10 MG tablet   gabapentin (NEURONTIN) 300 MG capsule labetalol (NORMODYNE) 100 MG tablet  levETIRAcetam (KEPPRA) 500 MG tablet  pantoprazole (PROTONIX) 40 MG tablet  sertraline (ZOLOFT) 50 MG tablet   No Alcohol for 24 hours before or after surgery.  No Smoking including e-cigarettes for 24 hours before surgery.  No chewable tobacco products for at least 6 hours before surgery.  No nicotine patches on the day of surgery.  Do not use any "recreational" drugs for at least a week (preferably 2 weeks) before your surgery.  Please be advised that the combination of cocaine and anesthesia may have negative outcomes, up to and including death. If you test positive for cocaine, your surgery will be  cancelled.  On the morning of surgery brush your teeth with toothpaste and water, you may rinse your mouth with mouthwash if you wish. Do not swallow any toothpaste or mouthwash.  Do not wear jewelry, make-up, hairpins, clips or nail polish.  Do not wear lotions, powders, or perfumes.   Do not shave body hair from the neck down 48 hours before surgery.  Contact lenses, hearing aids and dentures may not be worn into surgery.  Do not bring valuables to the hospital. [facility] is not responsible for any missing/lost belongings or valuables.   Notify your doctor if there is any change in your medical condition (cold, fever, infection).  Wear comfortable clothing (specific to your surgery type) to the hospital.  After surgery, you can help prevent lung complications by doing breathing exercises.  Take deep breaths and cough every 1-2 hours. Your doctor may order a device called an Incentive Spirometer to help you take deep breaths. When coughing or sneezing, hold a pillow firmly against your incision with both hands. This is called "splinting." Doing this helps protect your incision. It also decreases belly discomfort.  If you are being admitted to the hospital overnight, leave your suitcase in the car. After surgery it may be brought to your room.  In case of increased patient census, it may be necessary for you, the patient, to continue your postoperative care in the Same Day Surgery department.  If you are being discharged the day of surgery, you will not be allowed to drive home. You will need a responsible individual to  drive you home and stay with you for 24 hours after surgery.   If you are taking public transportation, you will need to have a responsible individual with you.  Please call the [facility] Dept. at [phone] if you have any questions about these instructions.  Surgery Visitation Policy:  Patients undergoing a surgery or procedure may have two  family members or support persons with them as long as the person is not COVID-19 positive or experiencing its symptoms.   Inpatient Visitation:    Visiting hours are 7 a.m. to 8 p.m. Up to four visitors are allowed at one time in a patient room. The visitors may rotate out with other people during the day. One designated support person (adult) may remain overnight.  Due to an increase in RSV and influenza rates and associated hospitalizations, children ages 78 and under will not be able to visit patients in [facility]. Masks continue to be strongly recommended.

## 2022-11-09 NOTE — Pre-Procedure Instructions (Signed)
Chart reviewed only. Received information from nursing facility. Review of med/surg history. Updated med list. Faxed pre op instruction to Wende Bushy LPN at facility.

## 2022-11-13 NOTE — Anesthesia Preprocedure Evaluation (Addendum)
Anesthesia Evaluation  Patient identified by MRN, date of birth, ID band Patient awake    Reviewed: NPO status , Patient's Chart, lab work & pertinent test results, Unable to perform ROS - Chart review onlyPreop documentation limited or incomplete due to emergent nature of procedure.  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), Residual Symptoms    GI/Hepatic SBO in 07/2012   Endo/Other  diabetes, Type 2    Renal/GU Renal InsufficiencyRenal diseaseS/p Left Ureteral Stone CKD     Musculoskeletal   Abdominal Normal abdominal exam  (+)   Peds  Hematology  (+) Blood dyscrasia, anemia   Anesthesia Other Findings Abd Xray: Small bowel obstruction  Reproductive/Obstetrics                              Anesthesia Physical Anesthesia Plan  ASA: III  Anesthesia Plan: General   Post-op Pain Management:    Induction:   PONV Risk Score and Plan:   Airway Management Planned: Natural Airway  Additional Equipment:   Intra-op Plan:   Post-operative Plan:   Informed Consent:   Plan Discussed with:   Anesthesia Plan Comments:          Anesthesia Quick Evaluation

## 2022-11-14 ENCOUNTER — Ambulatory Visit: Payer: Medicare Other | Admitting: Anesthesiology

## 2022-11-14 ENCOUNTER — Encounter: Payer: Self-pay | Admitting: Urology

## 2022-11-14 ENCOUNTER — Ambulatory Visit: Payer: Medicare Other

## 2022-11-14 ENCOUNTER — Encounter
Admission: RE | Disposition: A | Discharge status: Skilled Nursing Facility | Payer: Self-pay | Source: Home / Self Care | Attending: Urology

## 2022-11-14 ENCOUNTER — Ambulatory Visit
Admission: RE | Admit: 2022-11-14 | Discharge: 2022-11-14 | Disposition: A | Discharge status: Skilled Nursing Facility | Payer: Medicare Other | Attending: Urology | Admitting: Urology

## 2022-11-14 DIAGNOSIS — F17200 Nicotine dependence, unspecified, uncomplicated: Secondary | ICD-10-CM | POA: Insufficient documentation

## 2022-11-14 DIAGNOSIS — I4891 Unspecified atrial fibrillation: Secondary | ICD-10-CM | POA: Diagnosis not present

## 2022-11-14 DIAGNOSIS — E1122 Type 2 diabetes mellitus with diabetic chronic kidney disease: Secondary | ICD-10-CM | POA: Diagnosis not present

## 2022-11-14 DIAGNOSIS — N132 Hydronephrosis with renal and ureteral calculous obstruction: Secondary | ICD-10-CM | POA: Insufficient documentation

## 2022-11-14 DIAGNOSIS — F32A Depression, unspecified: Secondary | ICD-10-CM | POA: Diagnosis not present

## 2022-11-14 DIAGNOSIS — E1151 Type 2 diabetes mellitus with diabetic peripheral angiopathy without gangrene: Secondary | ICD-10-CM | POA: Insufficient documentation

## 2022-11-14 DIAGNOSIS — Z794 Long term (current) use of insulin: Secondary | ICD-10-CM | POA: Diagnosis not present

## 2022-11-14 DIAGNOSIS — I5022 Chronic systolic (congestive) heart failure: Secondary | ICD-10-CM | POA: Diagnosis not present

## 2022-11-14 DIAGNOSIS — I69354 Hemiplegia and hemiparesis following cerebral infarction affecting left non-dominant side: Secondary | ICD-10-CM | POA: Insufficient documentation

## 2022-11-14 DIAGNOSIS — Z466 Encounter for fitting and adjustment of urinary device: Secondary | ICD-10-CM

## 2022-11-14 DIAGNOSIS — N189 Chronic kidney disease, unspecified: Secondary | ICD-10-CM | POA: Diagnosis not present

## 2022-11-14 DIAGNOSIS — I6523 Occlusion and stenosis of bilateral carotid arteries: Secondary | ICD-10-CM | POA: Insufficient documentation

## 2022-11-14 DIAGNOSIS — Z960 Presence of urogenital implants: Secondary | ICD-10-CM

## 2022-11-14 DIAGNOSIS — I130 Hypertensive heart and chronic kidney disease with heart failure and stage 1 through stage 4 chronic kidney disease, or unspecified chronic kidney disease: Secondary | ICD-10-CM | POA: Diagnosis not present

## 2022-11-14 DIAGNOSIS — E119 Type 2 diabetes mellitus without complications: Secondary | ICD-10-CM

## 2022-11-14 DIAGNOSIS — Z8249 Family history of ischemic heart disease and other diseases of the circulatory system: Secondary | ICD-10-CM | POA: Insufficient documentation

## 2022-11-14 HISTORY — PX: CYSTOSCOPY W/ URETERAL STENT REMOVAL: SHX1430

## 2022-11-14 LAB — GLUCOSE, CAPILLARY
Glucose-Capillary: 112 mg/dL — ABNORMAL HIGH (ref 70–99)
Glucose-Capillary: 120 mg/dL — ABNORMAL HIGH (ref 70–99)

## 2022-11-14 SURGERY — REMOVAL, STENT, URETER, CYSTOSCOPIC
Anesthesia: General | Site: Ureter | Laterality: Left

## 2022-11-14 MED ORDER — PROMETHAZINE HCL 25 MG/ML IJ SOLN: 6.2500 mg | INTRAMUSCULAR | Status: DC | PRN

## 2022-11-14 MED ORDER — IOHEXOL 180 MG/ML  SOLN: INTRAMUSCULAR | Status: DC | PRN

## 2022-11-14 MED ORDER — DEXMEDETOMIDINE HCL IN NACL 200 MCG/50ML IV SOLN
INTRAVENOUS | Status: DC | PRN
  Administered 2022-11-14: 8 ug via INTRAVENOUS

## 2022-11-14 MED ORDER — GENTAMICIN SULFATE 40 MG/ML IJ SOLN
5.0000 mg/kg | INTRAVENOUS | Status: AC
  Administered 2022-11-14: 430 mg via INTRAVENOUS
  Filled 2022-11-14: qty 10.75

## 2022-11-14 MED ORDER — SODIUM CHLORIDE 0.9 % IR SOLN
Status: DC | PRN
  Administered 2022-11-14: 3000 mL

## 2022-11-14 MED ORDER — SODIUM CHLORIDE 0.9 % IV SOLN: INTRAVENOUS | Status: DC

## 2022-11-14 MED ORDER — PROPOFOL 10 MG/ML IV BOLUS
INTRAVENOUS | Status: AC
  Filled 2022-11-14: qty 20

## 2022-11-14 MED ORDER — DROPERIDOL 2.5 MG/ML IJ SOLN: 0.6250 mg | Freq: Once | INTRAMUSCULAR | Status: DC | PRN

## 2022-11-14 MED ORDER — CHLORHEXIDINE GLUCONATE 0.12 % MT SOLN: 15.0000 mL | Freq: Once | OROMUCOSAL | Status: DC

## 2022-11-14 MED ORDER — FENTANYL CITRATE (PF) 100 MCG/2ML IJ SOLN
INTRAMUSCULAR | Status: DC | PRN
  Administered 2022-11-14 (×2): 25 ug via INTRAVENOUS

## 2022-11-14 MED ORDER — ACETAMINOPHEN 10 MG/ML IV SOLN: 1000.0000 mg | Freq: Once | INTRAVENOUS | Status: DC | PRN

## 2022-11-14 MED ORDER — FENTANYL CITRATE (PF) 100 MCG/2ML IJ SOLN
INTRAMUSCULAR | Status: AC
  Filled 2022-11-14: qty 2

## 2022-11-14 MED ORDER — PROPOFOL 500 MG/50ML IV EMUL
INTRAVENOUS | Status: DC | PRN
  Administered 2022-11-14: 50 ug/kg/min via INTRAVENOUS

## 2022-11-14 MED ORDER — EPHEDRINE SULFATE (PRESSORS) 50 MG/ML IJ SOLN
INTRAMUSCULAR | Status: DC | PRN
  Administered 2022-11-14: 10 mg via INTRAVENOUS

## 2022-11-14 MED ORDER — FENTANYL CITRATE (PF) 100 MCG/2ML IJ SOLN: 25.0000 ug | INTRAMUSCULAR | Status: DC | PRN

## 2022-11-14 MED ORDER — OXYCODONE HCL 5 MG PO TABS: 5.0000 mg | ORAL_TABLET | Freq: Once | ORAL | Status: DC | PRN

## 2022-11-14 MED ORDER — PROPOFOL 10 MG/ML IV BOLUS
INTRAVENOUS | Status: DC | PRN
  Administered 2022-11-14: 50 mg via INTRAVENOUS
  Administered 2022-11-14: 20 mg via INTRAVENOUS

## 2022-11-14 MED ORDER — OXYCODONE HCL 5 MG/5ML PO SOLN: 5.0000 mg | Freq: Once | ORAL | Status: DC | PRN

## 2022-11-14 MED ORDER — ORAL CARE MOUTH RINSE: 15.0000 mL | Freq: Once | OROMUCOSAL | Status: DC

## 2022-11-14 SURGICAL SUPPLY — 14 items
BAG DRAIN SIEMENS DORNER NS (MISCELLANEOUS) ×1 IMPLANT
CATH URETL OPEN END 6X70 (CATHETERS) IMPLANT
GAUZE 4X4 16PLY ~~LOC~~+RFID DBL (SPONGE) ×2 IMPLANT
GLOVE SURG UNDER POLY LF SZ7.5 (GLOVE) ×1 IMPLANT
GOWN STRL REUS W/ TWL LRG LVL3 (GOWN DISPOSABLE) ×2 IMPLANT
GOWN STRL REUS W/ TWL XL LVL3 (GOWN DISPOSABLE) ×1 IMPLANT
GOWN STRL REUS W/TWL LRG LVL3 (GOWN DISPOSABLE) ×2
GOWN STRL REUS W/TWL XL LVL3 (GOWN DISPOSABLE) ×1
GUIDEWIRE STR DUAL SENSOR (WIRE) ×1 IMPLANT
IV NS IRRIG 3000ML ARTHROMATIC (IV SOLUTION) ×1 IMPLANT
PACK CYSTO AR (MISCELLANEOUS) ×1 IMPLANT
SET CYSTO W/LG BORE CLAMP LF (SET/KITS/TRAYS/PACK) ×1 IMPLANT
SURGILUBE 2OZ TUBE FLIPTOP (MISCELLANEOUS) ×1 IMPLANT
WATER STERILE IRR 500ML POUR (IV SOLUTION) ×1 IMPLANT

## 2022-11-14 NOTE — Op Note (Signed)
   Preoperative diagnosis:  Indwelling left ureteral stent  Postoperative diagnosis:  Same  Procedure: Cystoscopy with removal left ureteral stent  Surgeon: Abbie Sons, MD  Anesthesia: MAC  Complications: None  Intraoperative findings:  Mild stent encrustation, removed without difficulty  EBL: Minimal  Specimens: None  Indication: Rodney Estrada is a 72 y.o. male with a chronically obstructing left proximal ureteral calculus with left renal atrophy present since at least July 2021. He underwent an attempt at left ureteral stent placement 08/02/2022 however had complete occlusion and laser lithotripsy was required in order to place stent. Follow-up ureteroscopy was performed 08/29/2022. Recent renal scan showed hydronephrosis with 37% function of the left kidney. He presents today for stent removal.  After reviewing the management options for treatment, he elected to proceed with the above surgical procedure(s). We have discussed the potential benefits and risks of the procedure, side effects of the proposed treatment, the likelihood of the patient achieving the goals of the procedure, and any potential problems that might occur during the procedure or recuperation. Informed consent has been obtained.  Description of procedure:  The patient was taken to the operating room and deep sedation was obtained by anesthesia.  The patient was placed in the dorsal lithotomy position, prepped and draped in the usual sterile fashion, and preoperative antibiotics were administered. A preoperative time-out was performed.   A 21 French cystoscope with 30 degree lens was lubricated and passed per urethra and advanced proximally under direct vision.  Mild inflammatory changes of the left hemitrigone noted secondary to indwelling stent with mild stent encrustation.  A 0.038 guidewire was placed alongside the stent into the left ureter and advanced proximally without difficulty.  The cystoscope was removed  and repassed alongside the guidewire.  The stent was grasped with endoscopic forceps and removed without difficulty.  A left retrograde pyelogram was going to be performed however fluoroscopy was not working.  The guidewire was then removed.  He was transported to the PACU in stable condition.  Plan: Follow-up renal ultrasound 6-8 weeks   Abbie Sons, M.D.

## 2022-11-14 NOTE — Anesthesia Postprocedure Evaluation (Signed)
Anesthesia Post Note  Patient: Rodney Estrada  Procedure(s) Performed: CYSTOSCOPY WITH STENT REMOVAL (Left: Ureter)  Patient location during evaluation: PACU Anesthesia Type: General Level of consciousness: awake and alert Pain management: pain level controlled Vital Signs Assessment: post-procedure vital signs reviewed and stable Respiratory status: spontaneous breathing, nonlabored ventilation and respiratory function stable Cardiovascular status: blood pressure returned to baseline and stable Postop Assessment: no apparent nausea or vomiting Anesthetic complications: no   No notable events documented.   Last Vitals:  Vitals:   11/14/22 1015 11/14/22 1047  BP: (!) 144/69 (!) 115/53  Pulse: (!) 49 (!) 56  Resp: 16 16  Temp: (!) 36.2 C (!) 36.3 C  SpO2: 96% 99%    Last Pain:  Vitals:   11/14/22 1047  TempSrc:   PainSc: 0-No pain                 [place] Ouch

## 2022-11-14 NOTE — Interval H&P Note (Signed)
History and Physical Interval Note:  11/14/2022 9:02 AM  Rodney Estrada  has presented today for surgery, with the diagnosis of Indwelling Ureteral Stent.  The various methods of treatment have been discussed with the patient and family. After consideration of risks, benefits and other options for treatment, the patient has consented to  Procedure(s): CYSTOSCOPY WITH STENT REMOVAL (Left) as a surgical intervention.  The patient's history has been reviewed, patient examined, no change in status, stable for surgery.  I have reviewed the patient's chart and labs.  Questions were answered to the patient's satisfaction.     [place]

## 2022-11-14 NOTE — Transfer of Care (Signed)
Immediate Anesthesia Transfer of Care Note  Patient: Rodney Estrada  Procedure(s) Performed: CYSTOSCOPY WITH STENT REMOVAL (Left: Ureter)  Patient Location: PACU  Anesthesia Type:General  Level of Consciousness: drowsy and patient cooperative  Airway & Oxygen Therapy: Patient Spontanous Breathing and Patient connected to nasal cannula oxygen  Post-op Assessment: Report given to RN and Post -op Vital signs reviewed and stable  Post vital signs: Reviewed and stable  Last Vitals:  Vitals Value Taken Time  BP 94/67 11/14/22 0953  Temp 36.3 C 11/14/22 0951  Pulse 50 11/14/22 0954  Resp 10 11/14/22 0954  SpO2 98 % 11/14/22 0954  Vitals shown include unvalidated device data.  Last Pain:  Vitals:   11/14/22 0951  TempSrc:   PainSc: Asleep         Complications: No notable events documented.

## 2022-11-14 NOTE — H&P (Signed)
Urology H&P   History of Present Illness: Rodney Estrada is a 72 y.o. male with a chronically obstructing left proximal ureteral calculus with left renal atrophy present since at least July 2021.  He underwent an attempt at left ureteral stent placement 08/02/2022 however had complete occlusion and laser lithotripsy was required in order to place stent.  Follow-up ureteroscopy was performed 08/29/2022.  Recent renal scan showed hydronephrosis with 37% function of the left kidney.  He presents today for stent removal  Past Medical History:  Diagnosis Date   Acute right MCA stroke ([place]) 04/05/2020   a.) s/p mechanical thrombectomy + stenting (2.5 x 15 mm Resolute Onyx) + TPA   Aortic atherosclerosis (HCC)    Atrial fibrillation ([place])    a.) CHA2DS2VASc = 7 (age, CHF, HTN, CVA x2, vascular disease history, T2DM);  b.) rate/rhythm maintained on oral labetalol; chronically anticoagulated with ASA + clopidogrel   Basal cell carcinoma    Bilateral carotid artery stenosis    a.) CTA neck 0000000: [id number] LICA   CAD (coronary artery disease)    Chronic gingivitis    Chronic systolic (congestive) heart failure ([place])    a.) TTE 04/06/2020: EF 50-55%, mod LAE, mod AoV sclerosis/calc, triv TR/PR, mild MR, G2DD; b.) TTE 08/29/2021: EF >55%, mod LAE, triv AR/TR/PR. mild MR, G1DD.   Cocaine use    a.) last UDS (+) on 04/05/2020   Constipation    DOE (dyspnea on exertion)    Functional dyspepsia    Gout    Hemorrhoids    History of aspiration pneumonitis    History of ETOH abuse    HLD (hyperlipidemia)    Hypertension    Ileus ([place])    Infection due to ESBL-producing Escherichia coli 08/02/2022   Left hemiplegia ([place]) 04/05/2020   a.) s/p RIGHT MCA CVA   Long term current use of antithrombotics/antiplatelets    a.) on DAPT (ASA + clopidogrel)   Major depressive disorder    Muscle weakness    Nephrolithiasis    PVD (peripheral vascular disease) (HCC)    SBO (small bowel obstruction) ([place])  07/23/2022   Seborrhea capitis    T2DM (type 2 diabetes mellitus) ([place])    Vitamin D deficiency     Past Surgical History:  Procedure Laterality Date   CYSTOSCOPY/URETEROSCOPY/HOLMIUM LASER/STENT PLACEMENT Left 08/02/2022   Procedure: CYSTOSCOPY/URETEROSCOPY/HOLMIUM LASER/STENT PLACEMENT;  Surgeon: Abbie Sons, MD;  Location: [facility] OR[facility];  Service: Urology;  Laterality: Left;   CYSTOSCOPY/URETEROSCOPY/HOLMIUM LASER/STENT PLACEMENT Left 08/29/2022   Procedure: CYSTOSCOPY/URETEROSCOPY/HOLMIUM LASER/STENT EXCHANGE;  Surgeon: Abbie Sons, MD;  Location: [facility] OR[facility];  Service: Urology;  Laterality: Left;   IR CT HEAD LTD  04/05/2020   IR FLUORO GUIDE CV LINE RIGHT  08/01/2022   IR INTRA CRAN STENT  04/05/2020   IR PERCUTANEOUS ART THROMBECTOMY/INFUSION INTRACRANIAL INC DIAG ANGIO  04/05/2020       IR PERCUTANEOUS ART THROMBECTOMY/INFUSION INTRACRANIAL INC DIAG ANGIO  04/05/2020   IR US GUIDE VASC ACCESS RIGHT  08/01/2022   RADIOLOGY WITH ANESTHESIA N/A 04/05/2020   Procedure: IR WITH ANESTHESIA;  Surgeon: Radiologist, Medication, MD;  Location: [facility];  Service: Radiology;  Laterality: N/A;    Home Medications:  Current Meds  Medication Sig   allopurinol (ZYLOPRIM) 100 MG tablet Take 100 mg by mouth daily.   amLODipine (NORVASC) 10 MG tablet Take 10 mg by mouth daily.   atorvastatin (LIPITOR) 40 MG tablet Take 1 tablet (40 mg total) by mouth every evening.   carboxymethylcellulose (  REFRESH PLUS) 0.5 % SOLN Place 1 drop into both eyes 3 (three) times daily.   furosemide (LASIX) 20 MG tablet Take 20 mg by mouth daily.   gabapentin (NEURONTIN) 300 MG capsule Take 300 mg by mouth 3 (three) times daily.   HIBICLENS 4 % external liquid Apply topically.   Insulin lispro (HUMALOG JUNIOR KWIKPEN) 100 UNIT/ML Inject 2-14 Units into the skin 3 (three) times daily. Per Sliding Scale subcutaneous, Before Meals 201-250 give 2 units 251-300 give 4 units 201-350 give 6 units 351-400 give 8 units 401-450  give 10 units 451-500 give 12 units Glucometer readings >500 or HIGH give 14 units. Recheck in 2 hours if at that time blood sugar is >400 or less than 80 call PEC triage   labetalol (NORMODYNE) 100 MG tablet Take 1 tablet (100 mg total) by mouth 3 (three) times daily. (Patient taking differently: Take 100 mg by mouth 3 (three) times daily. Hold for SBP 100 or less or pulse less than 60, notify provider)   levETIRAcetam (KEPPRA) 500 MG tablet Take 500 mg by mouth 2 (two) times daily.   lisinopril (ZESTRIL) 40 MG tablet Take 40 mg by mouth at bedtime.   oxybutynin (DITROPAN-XL) 5 MG 24 hr tablet Take 1 tablet (5 mg total) by mouth at bedtime.   pantoprazole (PROTONIX) 40 MG tablet Take 1 tablet (40 mg total) by mouth 2 (two) times daily.   polyethylene glycol (MIRALAX / GLYCOLAX) 17 g packet Take 17 g by mouth daily as needed for mild constipation.   sertraline (ZOLOFT) 50 MG tablet Take 150 mg by mouth daily.   SYSTANE BALANCE 0.6 % SOLN Apply 1 drop to eye at bedtime. Both eyes   Vitamin D, Ergocalciferol, (DRISDOL) 1.25 MG (50000 UNIT) CAPS capsule Take 50,000 Units by mouth every 7 (seven) days. Mondays    Allergies: No Known Allergies  Family History  Problem Relation Age of Onset   Hypertension Mother    Hypertension Father     Social History:  reports that he has been smoking. He has never used smokeless tobacco. He reports current alcohol use of about 10.0 standard drinks of alcohol per week. He reports that he does not currently use drugs.  ROS: No fever, chills  Physical Exam:  Vital signs in last 24 hours: Temp:  [96.9 F (36.1 C)] 96.9 F (36.1 C) (02/20 0744) Pulse Rate:  [51] 51 (02/20 0744) Resp:  [16] 16 (02/20 0744) BP: (116)/(71) 116/71 (02/20 0744) SpO2:  [97 %] 97 % (02/20 0744) Weight:  [92.3 kg] 92.3 kg (02/20 0744) Constitutional:  Alert and oriented, No acute distress HEENT: [place] AT, moist mucus membranes.  Trachea midline, no masses Cardiovascular: Regular  rate and rhythm, no clubbing, cyanosis, or edema. Respiratory: Normal respiratory effort, lungs clear bilaterally GI: Abdomen is soft, nontender, nondistended, no abdominal masses GU: No CVA tenderness Skin: No rashes, bruises or suspicious lesions Lymph: No cervical or inguinal adenopathy Neurologic: Grossly intact, no focal deficits, moving all 4 extremities Psychiatric: Normal mood and affect   Laboratory Data:  No results for input(s): "WBC", "HGB", "HCT" in the last 72 hours. No results for input(s): "NA", "K", "CL", "CO2", "GLUCOSE", "BUN", "CREATININE", "CALCIUM" in the last 72 hours. No results for input(s): "LABPT", "INR" in the last 72 hours. No results for input(s): "LABURIN" in the last 72 hours. Results for orders placed or performed in visit on 09/29/22  CULTURE, URINE COMPREHENSIVE     Status: None   Collection Time: 09/29/22  11:34 AM   Specimen: Urine   UR  Result Value Ref Range Status   Urine Culture, Comprehensive Final report  Final   Organism ID, Bacteria Comment  Final    Comment: No growth in 36 - 48 hours.  Microscopic Examination     Status: Abnormal   Collection Time: 09/29/22 11:34 AM   Urine  Result Value Ref Range Status   WBC, UA >30 (A) 0 - 5 /hpf Final   RBC, Urine >30 (A) 0 - 2 /hpf Final   Epithelial Cells (non renal) 0-10 0 - 10 /hpf Final   Bacteria, UA Moderate (A) None seen/Few Final    Impression/Plan:  72 y.o. male presents for cystoscopy with stent removal Long-term impacted stone and has a high chance of developing ureteral stricture.  Recent renal scan showed 37% function of the left kidney   11/14/2022, 8:57 AM  Zoey Giovanni,  MD

## 2022-11-14 NOTE — Discharge Instructions (Addendum)
AMBULATORY SURGERY  DISCHARGE INSTRUCTIONS   The drugs that you were given will stay in your system until tomorrow so for the next 24 hours you should not:  Drive an automobile Make any legal decisions Drink any alcoholic beverage   You may resume regular meals tomorrow.  Today it is better to start with liquids and gradually work up to solid foods.  You may eat anything you prefer, but it is better to start with liquids, then soup and crackers, and gradually work up to solid foods.  Please notify your doctor immediately if you have any unusual bleeding, trouble breathing, redness and pain at the surgery site, drainage, fever, or pain not relieved by medication.  Additional Instructions:  [facility] [phone] for flank pain or fever >101 degrees.  A renal ultrasound will be scheduled in approximately 6-8 weeks   Please contact your physician with any problems or Same Day Surgery at [phone], Monday through Friday 6 am to 4 pm, or [place] at [facility] number at [phone].

## 2022-11-16 ENCOUNTER — Other Ambulatory Visit: Payer: Self-pay

## 2022-11-16 DIAGNOSIS — N1330 Unspecified hydronephrosis: Secondary | ICD-10-CM

## 2022-11-28 ENCOUNTER — Ambulatory Visit
Admission: RE | Admit: 2022-11-28 | Discharge: 2022-11-28 | Disposition: A | Discharge status: Home / Self Care | Payer: Medicare Other | Source: Ambulatory Visit | Attending: Urology | Admitting: Urology

## 2022-11-28 DIAGNOSIS — N1330 Unspecified hydronephrosis: Secondary | ICD-10-CM

## 2022-12-11 ENCOUNTER — Other Ambulatory Visit: Payer: Self-pay | Admitting: Urology

## 2022-12-11 ENCOUNTER — Telehealth: Payer: Self-pay | Admitting: *Deleted

## 2022-12-11 DIAGNOSIS — N1330 Unspecified hydronephrosis: Secondary | ICD-10-CM

## 2022-12-11 NOTE — Telephone Encounter (Signed)
Notified patient nurse  as instructed, Repeat RUS in six month

## 2022-12-11 NOTE — Telephone Encounter (Signed)
-----   Message from Abbie Sons, MD sent at 12/11/2022  7:55 AM EDT ----- Renal ultrasound showed moderate hydronephrosis which is expected with his previous chronic obstructing stone.  Recommend recommend repeat RUS 6 months..  Order was entered

## 2023-06-13 ENCOUNTER — Ambulatory Visit: Admission: RE | Admit: 2023-06-13 | Payer: Medicare Other | Source: Ambulatory Visit

## 2024-09-22 ENCOUNTER — Emergency Department

## 2024-09-22 ENCOUNTER — Inpatient Hospital Stay
Admission: EM | Admit: 2024-09-22 | Discharge: 2024-10-03 | DRG: 871 | Disposition: A | Discharge status: Hospice | Source: Skilled Nursing Facility | Attending: Student | Admitting: Student

## 2024-09-22 ENCOUNTER — Other Ambulatory Visit: Payer: Self-pay

## 2024-09-22 DIAGNOSIS — I69354 Hemiplegia and hemiparesis following cerebral infarction affecting left non-dominant side: Secondary | ICD-10-CM | POA: Diagnosis not present

## 2024-09-22 DIAGNOSIS — R6521 Severe sepsis with septic shock: Secondary | ICD-10-CM | POA: Diagnosis not present

## 2024-09-22 DIAGNOSIS — R079 Chest pain, unspecified: Secondary | ICD-10-CM | POA: Diagnosis not present

## 2024-09-22 DIAGNOSIS — N1831 Chronic kidney disease, stage 3a: Secondary | ICD-10-CM | POA: Diagnosis present

## 2024-09-22 DIAGNOSIS — E860 Dehydration: Secondary | ICD-10-CM | POA: Diagnosis present

## 2024-09-22 DIAGNOSIS — I5022 Chronic systolic (congestive) heart failure: Secondary | ICD-10-CM | POA: Diagnosis present

## 2024-09-22 DIAGNOSIS — R4701 Aphasia: Secondary | ICD-10-CM | POA: Diagnosis present

## 2024-09-22 DIAGNOSIS — R7989 Other specified abnormal findings of blood chemistry: Secondary | ICD-10-CM | POA: Diagnosis not present

## 2024-09-22 DIAGNOSIS — N368 Other specified disorders of urethra: Secondary | ICD-10-CM | POA: Diagnosis present

## 2024-09-22 DIAGNOSIS — J9601 Acute respiratory failure with hypoxia: Secondary | ICD-10-CM | POA: Diagnosis present

## 2024-09-22 DIAGNOSIS — Z7902 Long term (current) use of antithrombotics/antiplatelets: Secondary | ICD-10-CM | POA: Diagnosis not present

## 2024-09-22 DIAGNOSIS — F17200 Nicotine dependence, unspecified, uncomplicated: Secondary | ICD-10-CM | POA: Diagnosis present

## 2024-09-22 DIAGNOSIS — Z515 Encounter for palliative care: Secondary | ICD-10-CM

## 2024-09-22 DIAGNOSIS — G9341 Metabolic encephalopathy: Secondary | ICD-10-CM | POA: Diagnosis present

## 2024-09-22 DIAGNOSIS — N179 Acute kidney failure, unspecified: Secondary | ICD-10-CM | POA: Diagnosis present

## 2024-09-22 DIAGNOSIS — E1122 Type 2 diabetes mellitus with diabetic chronic kidney disease: Secondary | ICD-10-CM | POA: Diagnosis present

## 2024-09-22 DIAGNOSIS — I2489 Other forms of acute ischemic heart disease: Secondary | ICD-10-CM | POA: Diagnosis present

## 2024-09-22 DIAGNOSIS — E875 Hyperkalemia: Secondary | ICD-10-CM | POA: Diagnosis present

## 2024-09-22 DIAGNOSIS — R931 Abnormal findings on diagnostic imaging of heart and coronary circulation: Secondary | ICD-10-CM | POA: Diagnosis not present

## 2024-09-22 DIAGNOSIS — D631 Anemia in chronic kidney disease: Secondary | ICD-10-CM | POA: Diagnosis present

## 2024-09-22 DIAGNOSIS — N19 Unspecified kidney failure: Secondary | ICD-10-CM

## 2024-09-22 DIAGNOSIS — I2510 Atherosclerotic heart disease of native coronary artery without angina pectoris: Secondary | ICD-10-CM | POA: Diagnosis present

## 2024-09-22 DIAGNOSIS — I4891 Unspecified atrial fibrillation: Secondary | ICD-10-CM | POA: Diagnosis present

## 2024-09-22 DIAGNOSIS — Z87442 Personal history of urinary calculi: Secondary | ICD-10-CM

## 2024-09-22 DIAGNOSIS — B9620 Unspecified Escherichia coli [E. coli] as the cause of diseases classified elsewhere: Secondary | ICD-10-CM | POA: Diagnosis not present

## 2024-09-22 DIAGNOSIS — E872 Acidosis, unspecified: Secondary | ICD-10-CM | POA: Diagnosis present

## 2024-09-22 DIAGNOSIS — I130 Hypertensive heart and chronic kidney disease with heart failure and stage 1 through stage 4 chronic kidney disease, or unspecified chronic kidney disease: Secondary | ICD-10-CM | POA: Diagnosis present

## 2024-09-22 DIAGNOSIS — G9349 Other encephalopathy: Secondary | ICD-10-CM | POA: Diagnosis not present

## 2024-09-22 DIAGNOSIS — K5900 Constipation, unspecified: Secondary | ICD-10-CM | POA: Diagnosis present

## 2024-09-22 DIAGNOSIS — Z1612 Extended spectrum beta lactamase (ESBL) resistance: Secondary | ICD-10-CM | POA: Diagnosis not present

## 2024-09-22 DIAGNOSIS — K219 Gastro-esophageal reflux disease without esophagitis: Secondary | ICD-10-CM | POA: Diagnosis not present

## 2024-09-22 DIAGNOSIS — I10 Essential (primary) hypertension: Secondary | ICD-10-CM | POA: Diagnosis not present

## 2024-09-22 DIAGNOSIS — Z85828 Personal history of other malignant neoplasm of skin: Secondary | ICD-10-CM

## 2024-09-22 DIAGNOSIS — N12 Tubulo-interstitial nephritis, not specified as acute or chronic: Secondary | ICD-10-CM | POA: Diagnosis present

## 2024-09-22 DIAGNOSIS — E870 Hyperosmolality and hypernatremia: Secondary | ICD-10-CM | POA: Diagnosis not present

## 2024-09-22 DIAGNOSIS — E785 Hyperlipidemia, unspecified: Secondary | ICD-10-CM | POA: Diagnosis present

## 2024-09-22 DIAGNOSIS — R0902 Hypoxemia: Secondary | ICD-10-CM | POA: Diagnosis present

## 2024-09-22 DIAGNOSIS — E538 Deficiency of other specified B group vitamins: Secondary | ICD-10-CM | POA: Diagnosis present

## 2024-09-22 DIAGNOSIS — E663 Overweight: Secondary | ICD-10-CM | POA: Diagnosis present

## 2024-09-22 DIAGNOSIS — N189 Chronic kidney disease, unspecified: Secondary | ICD-10-CM

## 2024-09-22 DIAGNOSIS — M109 Gout, unspecified: Secondary | ICD-10-CM | POA: Diagnosis present

## 2024-09-22 DIAGNOSIS — A419 Sepsis, unspecified organism: Principal | ICD-10-CM | POA: Diagnosis present

## 2024-09-22 DIAGNOSIS — N10 Acute tubulo-interstitial nephritis: Secondary | ICD-10-CM | POA: Diagnosis not present

## 2024-09-22 DIAGNOSIS — Z7901 Long term (current) use of anticoagulants: Secondary | ICD-10-CM

## 2024-09-22 DIAGNOSIS — N390 Urinary tract infection, site not specified: Secondary | ICD-10-CM | POA: Diagnosis not present

## 2024-09-22 DIAGNOSIS — E440 Moderate protein-calorie malnutrition: Secondary | ICD-10-CM | POA: Diagnosis present

## 2024-09-22 DIAGNOSIS — Z66 Do not resuscitate: Secondary | ICD-10-CM | POA: Diagnosis present

## 2024-09-22 DIAGNOSIS — F32A Depression, unspecified: Secondary | ICD-10-CM | POA: Diagnosis present

## 2024-09-22 DIAGNOSIS — G40909 Epilepsy, unspecified, not intractable, without status epilepticus: Secondary | ICD-10-CM | POA: Diagnosis present

## 2024-09-22 DIAGNOSIS — Z8249 Family history of ischemic heart disease and other diseases of the circulatory system: Secondary | ICD-10-CM

## 2024-09-22 DIAGNOSIS — E8721 Acute metabolic acidosis: Secondary | ICD-10-CM | POA: Diagnosis present

## 2024-09-22 DIAGNOSIS — D509 Iron deficiency anemia, unspecified: Secondary | ICD-10-CM | POA: Diagnosis present

## 2024-09-22 DIAGNOSIS — Z7952 Long term (current) use of systemic steroids: Secondary | ICD-10-CM

## 2024-09-22 DIAGNOSIS — E1151 Type 2 diabetes mellitus with diabetic peripheral angiopathy without gangrene: Secondary | ICD-10-CM | POA: Diagnosis present

## 2024-09-22 DIAGNOSIS — Z6825 Body mass index (BMI) 25.0-25.9, adult: Secondary | ICD-10-CM

## 2024-09-22 DIAGNOSIS — E1142 Type 2 diabetes mellitus with diabetic polyneuropathy: Secondary | ICD-10-CM | POA: Diagnosis present

## 2024-09-22 DIAGNOSIS — Z7189 Other specified counseling: Secondary | ICD-10-CM | POA: Diagnosis not present

## 2024-09-22 DIAGNOSIS — B957 Other staphylococcus as the cause of diseases classified elsewhere: Secondary | ICD-10-CM | POA: Diagnosis not present

## 2024-09-22 DIAGNOSIS — Z7982 Long term (current) use of aspirin: Secondary | ICD-10-CM

## 2024-09-22 DIAGNOSIS — R4182 Altered mental status, unspecified: Secondary | ICD-10-CM

## 2024-09-22 DIAGNOSIS — R7881 Bacteremia: Secondary | ICD-10-CM | POA: Diagnosis not present

## 2024-09-22 DIAGNOSIS — E876 Hypokalemia: Secondary | ICD-10-CM | POA: Diagnosis not present

## 2024-09-22 DIAGNOSIS — Z993 Dependence on wheelchair: Secondary | ICD-10-CM

## 2024-09-22 DIAGNOSIS — Z79899 Other long term (current) drug therapy: Secondary | ICD-10-CM

## 2024-09-22 LAB — CBC WITH DIFFERENTIAL/PLATELET
Abs Immature Granulocytes: 0.08 K/uL — ABNORMAL HIGH (ref 0.00–0.07)
Basophils Absolute: 0 K/uL (ref 0.0–0.1)
Basophils Relative: 0 %
Eosinophils Absolute: 0 K/uL (ref 0.0–0.5)
Eosinophils Relative: 0 %
HCT: 34.9 % — ABNORMAL LOW (ref 39.0–52.0)
Hemoglobin: 11.1 g/dL — ABNORMAL LOW (ref 13.0–17.0)
Immature Granulocytes: 1 %
Lymphocytes Relative: 13 %
Lymphs Abs: 1.4 K/uL (ref 0.7–4.0)
MCH: 30.2 pg (ref 26.0–34.0)
MCHC: 31.8 g/dL (ref 30.0–36.0)
MCV: 94.8 fL (ref 80.0–100.0)
Monocytes Absolute: 0.7 K/uL (ref 0.1–1.0)
Monocytes Relative: 6 %
Neutro Abs: 9.1 K/uL — ABNORMAL HIGH (ref 1.7–7.7)
Neutrophils Relative %: 80 %
Platelets: 194 K/uL (ref 150–400)
RBC: 3.68 MIL/uL — ABNORMAL LOW (ref 4.22–5.81)
RDW: 13.2 % (ref 11.5–15.5)
WBC: 11.4 K/uL — ABNORMAL HIGH (ref 4.0–10.5)
nRBC: 0 % (ref 0.0–0.2)

## 2024-09-22 LAB — URINALYSIS, ROUTINE W REFLEX MICROSCOPIC
Bilirubin Urine: NEGATIVE
Glucose, UA: NEGATIVE mg/dL
Ketones, ur: NEGATIVE mg/dL
Nitrite: NEGATIVE
Protein, ur: 100 mg/dL — AB
RBC / HPF: >50 RBC/hpf (ref 0–5)
Specific Gravity, Urine: 1.018 (ref 1.005–1.030)
WBC, UA: >50 WBC/hpf (ref 0–5)
pH: 7 (ref 5.0–8.0)

## 2024-09-22 LAB — COMPREHENSIVE METABOLIC PANEL WITH GFR
ALT: 11 U/L (ref 0–44)
AST: 13 U/L — ABNORMAL LOW (ref 15–41)
Albumin: 4.1 g/dL (ref 3.5–5.0)
Alkaline Phosphatase: 70 U/L (ref 38–126)
Anion gap: 20 — ABNORMAL HIGH (ref 5–15)
BUN: 93 mg/dL — ABNORMAL HIGH (ref 8–23)
CO2: 17 mmol/L — ABNORMAL LOW (ref 22–32)
Calcium: 9.6 mg/dL (ref 8.9–10.3)
Chloride: 104 mmol/L (ref 98–111)
Creatinine, Ser: 5.4 mg/dL — ABNORMAL HIGH (ref 0.61–1.24)
GFR, Estimated: 11 mL/min — ABNORMAL LOW
Glucose, Bld: 124 mg/dL — ABNORMAL HIGH (ref 70–99)
Potassium: 5.2 mmol/L — ABNORMAL HIGH (ref 3.5–5.1)
Sodium: 141 mmol/L (ref 135–145)
Total Bilirubin: 0.3 mg/dL (ref 0.0–1.2)
Total Protein: 7.1 g/dL (ref 6.5–8.1)

## 2024-09-22 LAB — PRO BRAIN NATRIURETIC PEPTIDE: Pro Brain Natriuretic Peptide: 789 pg/mL — ABNORMAL HIGH

## 2024-09-22 LAB — GLUCOSE, CAPILLARY: Glucose-Capillary: 130 mg/dL — ABNORMAL HIGH (ref 70–99)

## 2024-09-22 LAB — RESP PANEL BY RT-PCR (RSV, FLU A&B, COVID)  RVPGX2
Influenza A by PCR: NEGATIVE
Influenza B by PCR: NEGATIVE
Resp Syncytial Virus by PCR: NEGATIVE
SARS Coronavirus 2 by RT PCR: NEGATIVE

## 2024-09-22 LAB — TROPONIN T, HIGH SENSITIVITY
Troponin T High Sensitivity: 187 ng/L (ref 0–19)
Troponin T High Sensitivity: 154 ng/L (ref 0–19)

## 2024-09-22 LAB — LACTIC ACID, PLASMA: Lactic Acid, Venous: 1 mmol/L (ref 0.5–1.9)

## 2024-09-22 MED ORDER — AMLODIPINE BESYLATE 10 MG PO TABS: 10.0000 mg | ORAL_TABLET | Freq: Every day | ORAL | Status: DC

## 2024-09-22 MED ORDER — PANTOPRAZOLE SODIUM 40 MG PO TBEC
40.0000 mg | DELAYED_RELEASE_TABLET | Freq: Two times a day (BID) | ORAL | Status: DC
  Administered 2024-09-22 – 2024-09-24 (×4): 40 mg via ORAL
  Filled 2024-09-22 (×4): qty 1

## 2024-09-22 MED ORDER — FUROSEMIDE 20 MG PO TABS: 20.0000 mg | ORAL_TABLET | Freq: Every day | ORAL | Status: DC

## 2024-09-22 MED ORDER — OXYBUTYNIN CHLORIDE ER 5 MG PO TB24
5.0000 mg | ORAL_TABLET | Freq: Every day | ORAL | Status: DC
  Administered 2024-09-22 – 2024-09-23 (×2): 5 mg via ORAL
  Filled 2024-09-22 (×4): qty 1

## 2024-09-22 MED ORDER — INSULIN ASPART 100 UNIT/ML IJ SOLN: 0.0000 [IU] | Freq: Three times a day (TID) | INTRAMUSCULAR | Status: DC

## 2024-09-22 MED ORDER — ONDANSETRON HCL 4 MG PO TABS: 4.0000 mg | ORAL_TABLET | Freq: Four times a day (QID) | ORAL | Status: DC | PRN

## 2024-09-22 MED ORDER — LACTATED RINGERS IV BOLUS (SEPSIS)
1000.0000 mL | Freq: Once | INTRAVENOUS | Status: AC
  Administered 2024-09-22: 1000 mL via INTRAVENOUS

## 2024-09-22 MED ORDER — ASPIRIN 81 MG PO CHEW
81.0000 mg | CHEWABLE_TABLET | Freq: Every day | ORAL | Status: DC
  Administered 2024-09-23 – 2024-09-24 (×2): 81 mg via ORAL
  Filled 2024-09-22 (×2): qty 1

## 2024-09-22 MED ORDER — ACETAMINOPHEN 650 MG RE SUPP: 650.0000 mg | Freq: Four times a day (QID) | RECTAL | Status: DC | PRN

## 2024-09-22 MED ORDER — CLOPIDOGREL BISULFATE 75 MG PO TABS
75.0000 mg | ORAL_TABLET | Freq: Every day | ORAL | Status: DC
  Administered 2024-09-23 – 2024-09-24 (×2): 75 mg via ORAL
  Filled 2024-09-22 (×2): qty 1

## 2024-09-22 MED ORDER — SODIUM ZIRCONIUM CYCLOSILICATE 10 G PO PACK
10.0000 g | PACK | Freq: Once | ORAL | Status: AC
  Administered 2024-09-23: 10 g via ORAL
  Filled 2024-09-22: qty 1

## 2024-09-22 MED ORDER — SENNOSIDES-DOCUSATE SODIUM 8.6-50 MG PO TABS
2.0000 | ORAL_TABLET | Freq: Every day | ORAL | Status: DC
  Administered 2024-09-22 – 2024-09-23 (×2): 2 via ORAL
  Filled 2024-09-22 (×2): qty 2

## 2024-09-22 MED ORDER — HEPARIN BOLUS VIA INFUSION
4000.0000 [IU] | Freq: Once | INTRAVENOUS | Status: AC
  Administered 2024-09-22: 4000 [IU] via INTRAVENOUS
  Filled 2024-09-22: qty 4000

## 2024-09-22 MED ORDER — TRAZODONE HCL 50 MG PO TABS: 25.0000 mg | ORAL_TABLET | Freq: Every evening | ORAL | Status: DC | PRN

## 2024-09-22 MED ORDER — SERTRALINE HCL 50 MG PO TABS
100.0000 mg | ORAL_TABLET | Freq: Every day | ORAL | Status: DC
  Administered 2024-09-23: 100 mg via ORAL
  Filled 2024-09-22: qty 2

## 2024-09-22 MED ORDER — VANCOMYCIN HCL 1750 MG/350ML IV SOLN
1750.0000 mg | Freq: Once | INTRAVENOUS | Status: AC
  Administered 2024-09-22: 1750 mg via INTRAVENOUS
  Filled 2024-09-22 (×2): qty 350

## 2024-09-22 MED ORDER — ATORVASTATIN CALCIUM 20 MG PO TABS
40.0000 mg | ORAL_TABLET | Freq: Every evening | ORAL | Status: DC
  Administered 2024-09-22 – 2024-09-23 (×2): 40 mg via ORAL
  Filled 2024-09-22 (×2): qty 2

## 2024-09-22 MED ORDER — HEPARIN (PORCINE) 25000 UT/250ML-% IV SOLN
1050.0000 [IU]/h | INTRAVENOUS | Status: DC
  Administered 2024-09-22: 1050 [IU]/h via INTRAVENOUS
  Filled 2024-09-22: qty 250

## 2024-09-22 MED ORDER — POLYVINYL ALCOHOL 1.4 % OP SOLN
1.0000 [drp] | Freq: Every day | OPHTHALMIC | Status: DC
  Administered 2024-09-23 – 2024-10-01 (×10): 1 [drp] via OPHTHALMIC
  Filled 2024-09-22 (×2): qty 15

## 2024-09-22 MED ORDER — ENOXAPARIN SODIUM 30 MG/0.3ML IJ SOSY
30.0000 mg | PREFILLED_SYRINGE | INTRAMUSCULAR | Status: DC
  Filled 2024-09-22: qty 0.3

## 2024-09-22 MED ORDER — LISINOPRIL 20 MG PO TABS
40.0000 mg | ORAL_TABLET | Freq: Every day | ORAL | Status: DC
  Administered 2024-09-22: 40 mg via ORAL
  Filled 2024-09-22: qty 2

## 2024-09-22 MED ORDER — SERTRALINE HCL 50 MG PO TABS: 150.0000 mg | ORAL_TABLET | Freq: Every day | ORAL | Status: DC

## 2024-09-22 MED ORDER — ACETAMINOPHEN 325 MG PO TABS: 650.0000 mg | ORAL_TABLET | Freq: Four times a day (QID) | ORAL | Status: DC | PRN

## 2024-09-22 MED ORDER — MAGNESIUM HYDROXIDE 400 MG/5ML PO SUSP: 30.0000 mL | Freq: Every day | ORAL | Status: DC | PRN

## 2024-09-22 MED ORDER — INSULIN ASPART 100 UNIT/ML IJ SOLN: 0.0000 [IU] | Freq: Every day | INTRAMUSCULAR | Status: DC

## 2024-09-22 MED ORDER — CAMPHOR-MENTHOL 0.5-0.5 % EX LOTN: 1.0000 | TOPICAL_LOTION | CUTANEOUS | Status: DC | PRN

## 2024-09-22 MED ORDER — LABETALOL HCL 100 MG PO TABS
100.0000 mg | ORAL_TABLET | Freq: Three times a day (TID) | ORAL | Status: DC
  Administered 2024-09-22: 100 mg via ORAL
  Filled 2024-09-22 (×5): qty 1

## 2024-09-22 MED ORDER — GABAPENTIN 300 MG PO CAPS
300.0000 mg | ORAL_CAPSULE | Freq: Three times a day (TID) | ORAL | Status: DC
  Administered 2024-09-22: 300 mg via ORAL
  Filled 2024-09-22: qty 1

## 2024-09-22 MED ORDER — ONDANSETRON HCL 4 MG/2ML IJ SOLN: 4.0000 mg | Freq: Four times a day (QID) | INTRAMUSCULAR | Status: DC | PRN

## 2024-09-22 MED ORDER — LEVETIRACETAM 500 MG PO TABS
500.0000 mg | ORAL_TABLET | Freq: Two times a day (BID) | ORAL | Status: DC
  Administered 2024-09-22 – 2024-09-23 (×2): 500 mg via ORAL
  Filled 2024-09-22 (×2): qty 1

## 2024-09-22 MED ORDER — VITAMIN D (ERGOCALCIFEROL) 1.25 MG (50000 UNIT) PO CAPS
50000.0000 [IU] | ORAL_CAPSULE | ORAL | Status: DC
  Filled 2024-09-22: qty 1

## 2024-09-22 MED ORDER — SODIUM CHLORIDE 0.9 % IV SOLN
2.0000 g | Freq: Once | INTRAVENOUS | Status: AC
  Administered 2024-09-22: 2 g via INTRAVENOUS
  Filled 2024-09-22: qty 12.5

## 2024-09-22 MED ORDER — IPRATROPIUM-ALBUTEROL 0.5-2.5 (3) MG/3ML IN SOLN: 3.0000 mL | RESPIRATORY_TRACT | Status: DC | PRN

## 2024-09-22 MED ORDER — ALLOPURINOL 100 MG PO TABS: 100.0000 mg | ORAL_TABLET | Freq: Every day | ORAL | Status: DC

## 2024-09-22 MED ORDER — VANCOMYCIN HCL IN DEXTROSE 1-5 GM/200ML-% IV SOLN: 1000.0000 mg | Freq: Once | INTRAVENOUS | Status: DC

## 2024-09-22 MED ORDER — GUAIFENESIN ER 600 MG PO TB12
600.0000 mg | ORAL_TABLET | Freq: Two times a day (BID) | ORAL | Status: DC
  Administered 2024-09-22 – 2024-09-24 (×4): 600 mg via ORAL
  Filled 2024-09-22 (×4): qty 1

## 2024-09-22 MED ORDER — ARIPIPRAZOLE 2 MG PO TABS
2.0000 mg | ORAL_TABLET | Freq: Every day | ORAL | Status: DC
  Administered 2024-09-22 – 2024-09-23 (×2): 2 mg via ORAL
  Filled 2024-09-22 (×4): qty 1

## 2024-09-22 NOTE — Assessment & Plan Note (Signed)
-   The patient will be given a dose of Lokelma  and will follow potassium level. - This is clearly secondary to his AKI

## 2024-09-22 NOTE — Assessment & Plan Note (Signed)
 Continue PPI therapy.

## 2024-09-22 NOTE — Assessment & Plan Note (Addendum)
-   The patient will be hydrated with IV normal saline and will follow BMP. - Will avoid nephrotoxins. - This AKI is on CKD stage IIIais likely prerenal due to volume depletion and dehydration.

## 2024-09-22 NOTE — ED Notes (Signed)
 Attempted to call listed legal guardian, Rhoda Ing, with only voicemail option. HIPAA compliant message left with call back instructions provided.

## 2024-09-22 NOTE — Assessment & Plan Note (Signed)
-   Will continue aspirin and Plavix as well as statin therapy.

## 2024-09-22 NOTE — Assessment & Plan Note (Signed)
-   Will continue Zoloft  and Abilify .

## 2024-09-22 NOTE — ED Notes (Signed)
 Both set of cultures drawn with other lab work and sent to lab.

## 2024-09-22 NOTE — Assessment & Plan Note (Signed)
 -  Continue antihypertensive therapy ?

## 2024-09-22 NOTE — Assessment & Plan Note (Signed)
-   The patient will be placed on supplemental coverage with NovoLog. - Will continue Neurontin.

## 2024-09-22 NOTE — ED Notes (Signed)
 Anna Devlin, PA on call for [employer] called to check on pts status.

## 2024-09-22 NOTE — ED Provider Notes (Signed)
 "  [facility] Provider Note    Event Date/Time   First MD Initiated Contact with Patient 09/22/24 1714     (approximate)   History   No chief complaint on file.   HPI  Rodney Estrada is a 73 y.o. male with a history of peripheral vascular disease, hypertension, CVA, and type 2 diabetes who presents with altered mental status.  Per EMS, the patient was noted to have a decline in his mental status since Friday at his facility.  He has been nonverbal, confused appearing, with increasing tremors.  EMS did not note a fever.  The facility gave IM Rocephin  this afternoon.  Staff noted a wet cough.  The patient himself is unable to provide any history.  I reviewed the past medical records.  The patient has no recent ED visits or admissions.  His most recent documented outpatient encounter was in early 2024 with urology for cystoscopy and removal of a ureteral stent.   Physical Exam   Triage Vital Signs: ED Triage Vitals  Encounter Vitals Group     BP      Girls Systolic BP Percentile      Girls Diastolic BP Percentile      Boys Systolic BP Percentile      Boys Diastolic BP Percentile      Pulse      Resp      Temp      Temp src      SpO2      Weight      Height      Head Circumference      Peak Flow      Pain Score      Pain Loc      Pain Education      Exclude from Growth Chart     Most recent vital signs: Vitals:   09/22/24 2100 09/22/24 2138  BP: (!) 108/58 103/60  Pulse: (!) 54 (!) 58  Resp: 12 14  Temp:  (!) 97.4 F (36.3 C)  SpO2: 100% 96%     General: Lethargic appearing but somewhat responsive. CV:  Good peripheral perfusion.  Resp:  Normal effort.  Diminished breath sounds bilaterally.  No rales. Abd:  No distention.  Other:  PERRLA.  Otherwise unable to complete neuroexam due to the patient's mental status.  He opens his eyes spontaneously and does follow some commands such as to open his eyes or squeeze, but nonverbal.  Some  tremors and muscle twitching with no seizure-like activity.   ED Results / Procedures / Treatments   Labs (all labs ordered are listed, but only abnormal results are displayed) Labs Reviewed  COMPREHENSIVE METABOLIC PANEL WITH GFR - Abnormal; Notable for the following components:      Result Value   Potassium 5.2 (*)    CO2 17 (*)    Glucose, Bld 124 (*)    BUN 93 (*)    Creatinine, Ser 5.40 (*)    AST 13 (*)    GFR, Estimated 11 (*)    Anion gap 20 (*)    All other components within normal limits  CBC WITH DIFFERENTIAL/PLATELET - Abnormal; Notable for the following components:   WBC 11.4 (*)    RBC 3.68 (*)    Hemoglobin 11.1 (*)    HCT 34.9 (*)    Neutro Abs 9.1 (*)    Abs Immature Granulocytes 0.08 (*)    All other components within normal limits  URINALYSIS, ROUTINE W REFLEX  MICROSCOPIC - Abnormal; Notable for the following components:   Color, Urine AMBER (*)    APPearance CLOUDY (*)    Hgb urine dipstick LARGE (*)    Protein, ur 100 (*)    Leukocytes,Ua MODERATE (*)    Bacteria, UA RARE (*)    All other components within normal limits  PRO BRAIN NATRIURETIC PEPTIDE - Abnormal; Notable for the following components:   Pro Brain Natriuretic Peptide 789.0 (*)    All other components within normal limits  TROPONIN T, HIGH SENSITIVITY - Abnormal; Notable for the following components:   Troponin T High Sensitivity 187 (*)    All other components within normal limits  RESP PANEL BY RT-PCR (RSV, FLU A&B, COVID)  RVPGX2  CULTURE, BLOOD (ROUTINE X 2)  CULTURE, BLOOD (ROUTINE X 2)  LACTIC ACID, PLASMA  BASIC METABOLIC PANEL WITH GFR  CBC  HEMOGLOBIN A1C  HEPARIN  LEVEL (UNFRACTIONATED)  TROPONIN T, HIGH SENSITIVITY     EKG  ED ECG REPORT I, Waylon Cassis, the attending physician, personally viewed and interpreted this ECG.  Date: 09/22/2024 EKG Time: 1726 Rate: 62 Rhythm: normal sinus rhythm QRS Axis: normal Intervals: normal ST/T Wave abnormalities:  Nonspecific ST abnormalities Narrative Interpretation: no evidence of acute ischemia    RADIOLOGY  Chest x-ray: I independently viewed and interpreted the images; there is no focal consolidation or edema  CT head:   IMPRESSION:  1. Large remote right MCA territory infarct with ex vacuo dilatation of the  right lateral ventricle and a vascular stent in the right MCA M2 segment.  2. Mild chronic microvascular ischemic white matter changes.  3. Mild global cerebral parenchymal atrophy.  4. Calcific atherosclerosis.  5. Right maxillary sinus mucosal thickening.    PROCEDURES:  Critical Care performed: Yes, see critical care procedure note(s)  .Critical Care  Performed by: Cassis Waylon, MD Authorized by: Cassis Waylon, MD   Critical care provider statement:    Critical care time (minutes):  30   Critical care time was exclusive of:  Separately billable procedures and treating other patients   Critical care was necessary to treat or prevent imminent or life-threatening deterioration of the following conditions:  Respiratory failure and sepsis   Critical care was time spent personally by me on the following activities:  Development of treatment plan with patient or surrogate, discussions with consultants, evaluation of patient's response to treatment, examination of patient, ordering and review of laboratory studies, ordering and review of radiographic studies, ordering and performing treatments and interventions, pulse oximetry, re-evaluation of patient's condition and review of old charts   Care discussed with: admitting provider      MEDICATIONS ORDERED IN ED: Medications  vancomycin  (VANCOREADY) IVPB 1750 mg/350 mL (has no administration in time range)  acetaminophen  (TYLENOL ) tablet 650 mg (has no administration in time range)    Or  acetaminophen  (TYLENOL ) suppository 650 mg (has no administration in time range)  traZODone  (DESYREL ) tablet 25 mg (has no  administration in time range)  magnesium  hydroxide (MILK OF MAGNESIA) suspension 30 mL (has no administration in time range)  ondansetron  (ZOFRAN ) tablet 4 mg (has no administration in time range)    Or  ondansetron  (ZOFRAN ) injection 4 mg (has no administration in time range)  allopurinol  (ZYLOPRIM ) tablet 100 mg (has no administration in time range)  aspirin  chewable tablet 81 mg (has no administration in time range)  amLODipine  (NORVASC ) tablet 10 mg (has no administration in time range)  atorvastatin  (LIPITOR ) tablet 40 mg (40 mg  Oral Given 09/22/24 2246)  furosemide  (LASIX ) tablet 20 mg (has no administration in time range)  labetalol  (NORMODYNE ) tablet 100 mg (has no administration in time range)  lisinopril  (ZESTRIL ) tablet 40 mg (40 mg Oral Given 09/22/24 2245)  ARIPiprazole  (ABILIFY ) tablet 2 mg (has no administration in time range)  sertraline  (ZOLOFT ) tablet 100 mg (has no administration in time range)  sertraline  (ZOLOFT ) tablet 150 mg (has no administration in time range)  pantoprazole  (PROTONIX ) EC tablet 40 mg (40 mg Oral Given 09/22/24 2245)  senna-docusate (Senokot-S) tablet 2 tablet (2 tablets Oral Given 09/22/24 2246)  oxybutynin  (DITROPAN -XL) 24 hr tablet 5 mg (has no administration in time range)  clopidogrel  (PLAVIX ) tablet 75 mg (has no administration in time range)  gabapentin  (NEURONTIN ) capsule 300 mg (300 mg Oral Given 09/22/24 2246)  levETIRAcetam  (KEPPRA ) tablet 500 mg (500 mg Oral Given 09/22/24 2246)  Vitamin D (Ergocalciferol) (DRISDOL) 1.25 MG (50000 UNIT) capsule 50,000 Units (has no administration in time range)  guaiFENesin  (MUCINEX ) 12 hr tablet 600 mg (600 mg Oral Given 09/22/24 2246)  ipratropium-albuterol  (DUONEB) 0.5-2.5 (3) MG/3ML nebulizer solution 3 mL (has no administration in time range)  camphor-menthol  (SARNA) lotion 1 Application (has no administration in time range)  artificial tears ophthalmic solution 1 drop (has no administration in time  range)  insulin  aspart (novoLOG ) injection 0-15 Units (has no administration in time range)  insulin  aspart (novoLOG ) injection 0-5 Units (has no administration in time range)  heparin  ADULT infusion 100 units/mL (25000 units/250mL) (1,050 Units/hr Intravenous New Bag/Given 09/22/24 2244)  lactated ringers  bolus 1,000 mL (0 mLs Intravenous Stopped 09/22/24 1952)  ceFEPIme  (MAXIPIME ) 2 g in sodium chloride  0.9 % 100 mL IVPB (0 g Intravenous Stopped 09/22/24 1923)  heparin  bolus via infusion 4,000 Units (4,000 Units Intravenous Bolus from Bag 09/22/24 2244)     IMPRESSION / MDM / ASSESSMENT AND PLAN / ED COURSE  I reviewed the triage vital signs and the nursing notes.  73 year old male with PMH as noted above presents with altered mental status and lethargy for the last several days.  On exam the vital signs are normal, however the patient is ill and lethargic appearing.  He opens his eyes on command but is nonverbal.  Pupils are reactive.  Lung sounds are diminished bilaterally.  He has no significant edema.  Differential diagnosis includes, but is not limited to, acute infection, influenza, other viral syndrome, bacterial pneumonia, UTI, AKI, electrolyte abnormality, other metabolic cause, less likely cardiac or CNS etiology.  We will obtain CT head, chest x-ray, lab workup, and reassess.  Patient's presentation is most consistent with acute presentation with potential threat to life or bodily function.  The patient is on the cardiac monitor to evaluate for evidence of arrhythmia and/or significant heart rate changes.   ----------------------------------------- 10:48 PM on 09/22/2024 -----------------------------------------  CT head is negative for acute findings.  CBC shows mild leukocytosis.  CMP is significant for creatinine 5.4 consistent with AKI.  Lactate is normal.  Urinalysis shows findings consistent with possible UTI.  Respiratory panel and chest x-ray are negative.  Troponin is  elevated but this is more consistent with demand ischemia in the context of hypoxia and possible sepsis.  There is no evidence of ACS.  The patient has received empiric antibiotics and fluids.  He will need admission for further management.  I consulted Dr. Lawence from the hospitalist service; based on our discussion he agrees to evaluate the patient for admission.   FINAL CLINICAL IMPRESSION(S) /  ED DIAGNOSES   Final diagnoses:  Acute respiratory failure with hypoxia (HCC)  Altered mental status, unspecified altered mental status type  Urinary tract infection without hematuria, site unspecified     Rx / DC Orders   ED Discharge Orders     None        Note:  This document was prepared using Dragon voice recognition software and may include unintentional dictation errors.    Jacolyn Pae, MD 09/22/24 2250  "

## 2024-09-22 NOTE — ED Notes (Signed)
 Pts sister and POA ([place], [place]) updated on plan of care and pts admission.

## 2024-09-22 NOTE — Assessment & Plan Note (Signed)
Will continue allopurinol. 

## 2024-09-22 NOTE — Assessment & Plan Note (Signed)
-   Will continue Keppra.

## 2024-09-22 NOTE — ED Triage Notes (Signed)
 Pt from [facility]. Previous hx of a stroke. LKW was Friday. Staff states that he has not been acting normal since Friday. Tremors worse, not talking, confused. EMS reports afebrile. Gave 1g IM of rocephin  at 1400 today. Staff reports wet cough. Staff attempted to cath pt and was not able to get urine. They report blood clots.

## 2024-09-22 NOTE — Assessment & Plan Note (Signed)
 Will continue statin therapy

## 2024-09-22 NOTE — Assessment & Plan Note (Signed)
-   The patient will be placed on IV Rocephin  and will follow urine culture and sensitivity. - This is likely contributing to #1.

## 2024-09-22 NOTE — Assessment & Plan Note (Signed)
-   The patient was admitted to a medical telemetry bed. - Will follow neurochecks every hours for 24 hours. - We will obtain a brain MRI without contrast for further assessment for acute CVA given his aphasia. - This could certainly be related to the AKI and UTI.

## 2024-09-22 NOTE — H&P (Addendum)
 "     [place]   PATIENT NAME: Rodney Estrada    MR#:  [id number]  DATE OF BIRTH:  04-24-1951  DATE OF ADMISSION:  09/22/2024  PRIMARY CARE PHYSICIAN: Pcp, No   Patient is coming from: SNF  REQUESTING/REFERRING PHYSICIAN: Jacolyn Guild, MD  CHIEF COMPLAINT:  Altered mental status  HISTORY OF PRESENT ILLNESS:  Rodney Estrada is a 73 y.o. male with medical history significant for coronary artery disease, chronic systolic CHF, gout, hypertension, dyslipidemia, atrial fibrillation and CVA as well as type 2 diabetes mellitus who presented to the emergency room with acute onset of altered mental status with confusion and decreased responsiveness since Friday at his SNF.  He has been nonverbal with increased tremors.  No reported fever or chills.  SNF staff noted congested cough and therefore the patient was given IM Rocephin  this afternoon.  The patient was nonverbal during my interview and would answer some questions nodding his head.  No chest pain or palpitations were reported.  No reported nausea or vomiting or diarrhea or abdominal pain.    ED Course: When the patient came to the ER, temperature was 97 BP 98/63 with otherwise normal vital signs.  Labs revealed creatinine of 5.2 and CO2 17 with anion gap of 20 BUN 93 creatinine 5.4 compared to 24 and 1.54.  proBNP was 789.  High-sensitivity troponin T was 187 and later 154.  Lactic acid was 1.  CBC showed leukocytosis of 11.4 and hemoglobin 11.1 hematocrit 34.9.  UA was positive for UTI. EKG as reviewed by me : EKG showed normal sinus rhythm with rate of 62 with Q waves anteroseptally and prolonged PR interval and Q waves inferiorly. Imaging: Portable chest x-ray showed no acute cardiopulmonary disease.  It showed low lung volumes and aortic calcification.  The patient was given 1 L bolus of IV lactated Ringer , IV vancomycin  and cefepime .  He will be admitted to a medical telemetry bed for further evaluation and management. PAST MEDICAL  HISTORY:   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   a.) s/p mechanical thrombectomy + stenting (2.5 x 15 mm Resolute Onyx) + TPA   Aortic atherosclerosis    Atrial fibrillation (HCC)    a.) CHA2DS2VASc = 7 (age, CHF, HTN, CVA x2, vascular disease history, T2DM);  b.) rate/rhythm maintained on oral labetalol ; chronically anticoagulated with ASA + clopidogrel    Basal cell carcinoma    Bilateral carotid artery stenosis    a.) CTA neck 04/05/2020: 40-50% LICA   CAD (coronary artery disease)    Chronic gingivitis    Chronic systolic (congestive) heart failure (HCC)    a.) TTE 04/06/2020: EF 50-55%, mod LAE, mod AoV sclerosis/calc, triv TR/PR, mild MR, G2DD; b.) TTE 08/29/2021: EF >55%, mod LAE, triv AR/TR/PR. mild MR, G1DD.   Cocaine use    a.) last UDS (+) on 04/05/2020   Constipation    DOE (dyspnea on exertion)    Functional dyspepsia    Gout    Hemorrhoids    History of aspiration pneumonitis    History of ETOH abuse    HLD (hyperlipidemia)    Hypertension    Ileus (HCC)    Infection due to ESBL-producing Escherichia coli 08/02/2022   Left hemiplegia (HCC) 04/05/2020   a.) s/p RIGHT MCA CVA   Long term current use of antithrombotics/antiplatelets    a.) on DAPT (ASA + clopidogrel )   Major depressive disorder    Muscle weakness  Nephrolithiasis    PVD (peripheral vascular disease)    SBO (small bowel obstruction) (HCC) 07/23/2022   Seborrhea capitis    T2DM (type 2 diabetes mellitus) (HCC)    Vitamin D deficiency     PAST SURGICAL HISTORY:   Past Surgical History:  Procedure Laterality Date   CYSTOSCOPY W/ URETERAL STENT REMOVAL Left 11/14/2022   Procedure: CYSTOSCOPY WITH STENT REMOVAL;  Surgeon: Twylla Glendia BROCKS, MD;  Location: [facility];  Service: Urology;  Laterality: Left;   CYSTOSCOPY/URETEROSCOPY/HOLMIUM LASER/STENT PLACEMENT Left 08/02/2022   Procedure: CYSTOSCOPY/URETEROSCOPY/HOLMIUM LASER/STENT PLACEMENT;  Surgeon:  Twylla Glendia BROCKS, MD;  Location: [facility];  Service: Urology;  Laterality: Left;   CYSTOSCOPY/URETEROSCOPY/HOLMIUM LASER/STENT PLACEMENT Left 08/29/2022   Procedure: CYSTOSCOPY/URETEROSCOPY/HOLMIUM LASER/STENT EXCHANGE;  Surgeon: Twylla Glendia BROCKS, MD;  Location: [facility];  Service: Urology;  Laterality: Left;   IR CT HEAD LTD  04/05/2020   IR FLUORO GUIDE CV LINE RIGHT  08/01/2022   IR INTRA CRAN STENT  04/05/2020   IR PERCUTANEOUS ART THROMBECTOMY/INFUSION INTRACRANIAL INC DIAG ANGIO  04/05/2020       IR PERCUTANEOUS ART THROMBECTOMY/INFUSION INTRACRANIAL INC DIAG ANGIO  04/05/2020   IR US  GUIDE VASC ACCESS RIGHT  08/01/2022   RADIOLOGY WITH ANESTHESIA N/A 04/05/2020   Procedure: IR WITH ANESTHESIA;  Surgeon: Radiologist, Medication, MD;  Location: [facility];  Service: Radiology;  Laterality: N/A;    SOCIAL HISTORY:   Social History   Tobacco Use   Smoking status: Every Day   Smokeless tobacco: Never  Substance Use Topics   Alcohol  use: Yes    Alcohol /week: 10.0 standard drinks of alcohol     Types: 10 Cans of beer per week    Comment: every DAy    FAMILY HISTORY:   Family History  Problem Relation Age of Onset   Hypertension Mother    Hypertension Father     DRUG ALLERGIES:  Allergies[1]  REVIEW OF SYSTEMS:   ROS As per history of present illness. All pertinent systems were reviewed above. Constitutional, HEENT, cardiovascular, respiratory, GI, GU, musculoskeletal, neuro, psychiatric, endocrine, integumentary and hematologic systems were reviewed and are otherwise negative/unremarkable except for positive findings mentioned above in the HPI.   MEDICATIONS AT HOME:   Prior to Admission medications  Medication Sig Start Date End Date Taking? Authorizing Provider  acetaminophen  (TYLENOL ) 500 MG tablet Take 1,000 mg by mouth 2 (two) times daily.   Yes [provider]  allopurinol  (ZYLOPRIM ) 100 MG tablet Take 100 mg by mouth daily. 07/12/22  Yes [provider]   amLODipine  (NORVASC ) 10 MG tablet Take 10 mg by mouth daily. 07/10/22  Yes [provider]  ARIPiprazole  (ABILIFY ) 2 MG tablet Take 2 mg by mouth at bedtime. 07/09/22  Yes [provider]  aspirin  81 MG chewable tablet Chew 1 tablet (81 mg total) by mouth daily. 04/16/20  Yes Noemi Reena CROME, NP  atorvastatin  (LIPITOR ) 40 MG tablet Take 1 tablet (40 mg total) by mouth every evening. 08/05/22  Yes Amin, Ankit C, MD  camphor-menthol  (SARNA) lotion Apply 1 Application topically as needed for itching.   Yes [provider]  carboxymethylcellulose (REFRESH PLUS) 0.5 % SOLN Place 1 drop into both eyes 3 (three) times daily. 07/15/22  Yes [provider]  cefTRIAXone  (ROCEPHIN ) 1 g injection Inject 1 g into the muscle daily. 09/22/24  Yes [provider]  clopidogrel  (PLAVIX ) 75 MG tablet Take 1 tablet (75 mg total) by mouth daily. 04/19/20  Yes Noemi Reena CROME, NP  furosemide  (  LASIX ) 20 MG tablet Take 20 mg by mouth daily. 07/07/22  Yes [provider]  gabapentin  (NEURONTIN ) 300 MG capsule Take 300 mg by mouth 3 (three) times daily. 07/22/22  Yes [provider]  guaiFENesin  (MUCINEX ) 600 MG 12 hr tablet Take 600 mg by mouth 2 (two) times daily.   Yes [provider]  ipratropium-albuterol  (DUONEB) 0.5-2.5 (3) MG/3ML SOLN Inhale 3 mLs into the lungs every 4 (four) hours as needed. 09/22/24  Yes [provider]  labetalol  (NORMODYNE ) 100 MG tablet Take 1 tablet (100 mg total) by mouth 3 (three) times daily. Patient taking differently: Take 100 mg by mouth 3 (three) times daily. Hold for SBP 100 or less or pulse less than 60, notify provider 04/19/20  Yes Noemi Reena CROME, NP  lactulose  (CHRONULAC ) 10 GM/15ML solution Take 20 g by mouth 2 (two) times daily as needed for mild constipation.   Yes [provider]  levETIRAcetam  (KEPPRA ) 500 MG tablet Take 500 mg by mouth 2 (two) times daily.   Yes [provider]   lisinopril  (ZESTRIL ) 40 MG tablet Take 40 mg by mouth at bedtime. 06/26/22  Yes [provider]  oxybutynin  (DITROPAN -XL) 5 MG 24 hr tablet Take 1 tablet (5 mg total) by mouth at bedtime. 08/05/22  Yes Amin, Ankit C, MD  pantoprazole  (PROTONIX ) 40 MG tablet Take 1 tablet (40 mg total) by mouth 2 (two) times daily. 08/05/22  Yes Amin, Ankit C, MD  polyethylene glycol (MIRALAX  / GLYCOLAX ) 17 g packet Take 17 g by mouth daily as needed for mild constipation. 08/05/22  Yes Amin, Ankit C, MD  predniSONE (DELTASONE) 20 MG tablet Take 20 mg by mouth daily. 09/21/24  Yes [provider]  selenium sulfide (SELSUN) 1 % LOTN Apply 1 Application topically daily.   Yes [provider]  senna-docusate (SENOKOT-S) 8.6-50 MG tablet Take 2 tablets by mouth at bedtime.   Yes [provider]  sertraline  (ZOLOFT ) 100 MG tablet Take 100 mg by mouth daily. 09/16/24  Yes [provider]  sertraline  (ZOLOFT ) 25 MG tablet Take 25 mg by mouth daily. 09/16/24  Yes [provider]  sertraline  (ZOLOFT ) 50 MG tablet Take 150 mg by mouth daily. 06/27/22  Yes [provider]  SYSTANE BALANCE 0.6 % SOLN Apply 1 drop to eye at bedtime. Both eyes 07/15/22  Yes [provider]  Vitamin D, Ergocalciferol, (DRISDOL) 1.25 MG (50000 UNIT) CAPS capsule Take 50,000 Units by mouth every 7 (seven) days. Mondays 07/11/22  Yes [provider]      VITAL SIGNS:  Blood pressure 110/64, pulse (!) 50, temperature 97.8 F (36.6 C), resp. rate 18, height 5' 9 (1.753 m), weight 77 kg, SpO2 98%.  PHYSICAL EXAMINATION:  Physical Exam  GENERAL:  73 y.o.-year-old Caucasian male patient lying in the bed with no acute distress.  EYES: Pupils equal, round, reactive to light and accommodation. No scleral icterus. Extraocular muscles intact.  HEENT: Head atraumatic, normocephalic. Oropharynx and nasopharynx clear.  NECK:  Supple, no jugular venous distention. No thyroid  enlargement, no tenderness.  LUNGS: Normal breath sounds bilaterally, no wheezing, rales,rhonchi or crepitation. No use of accessory muscles of respiration.  CARDIOVASCULAR: Regular rate and rhythm, S1, S2 normal. No murmurs, rubs, or gallops.  ABDOMEN: Soft, nondistended, nontender. Bowel sounds present. No organomegaly or mass.  EXTREMITIES: No pedal edema, cyanosis, or clubbing.  NEUROLOGIC: Exam is grossly nonfocal.  The patient is nonverbal though.  He was answering questions nodding his  head.  Sensation intact. Gait not checked.  PSYCHIATRIC: The patient is alert and oriented x 3.  Normal affect and good eye contact. SKIN: No obvious rash, lesion, or ulcer.   LABORATORY PANEL:   CBC Recent Labs  Lab 09/23/24 0628  WBC 10.0  HGB 11.2*  HCT 36.1*  PLT 169   ------------------------------------------------------------------------------------------------------------------  Chemistries  Recent Labs  Lab 09/22/24 1758  NA 141  K 5.2*  CL 104  CO2 17*  GLUCOSE 124*  BUN 93*  CREATININE 5.40*  CALCIUM  9.6  AST 13*  ALT 11  ALKPHOS 70  BILITOT 0.3   ------------------------------------------------------------------------------------------------------------------  Cardiac Enzymes No results for input(s): TROPONINI in the last 168 hours. ------------------------------------------------------------------------------------------------------------------  RADIOLOGY:  CT Head Wo Contrast Result Date: 09/22/2024 EXAM: CT HEAD WITHOUT CONTRAST 09/22/2024 06:47:02 PM TECHNIQUE: CT of the head was performed without the administration of intravenous contrast. Automated exposure control, iterative reconstruction, and/or weight based adjustment of the mA/kV was utilized to reduce the radiation dose to as low as reasonably achievable. COMPARISON: None available. CLINICAL HISTORY: Mental status change, unknown cause FINDINGS: BRAIN AND VENTRICLES: No acute hemorrhage. Large remote  right MCA territory infarct. Vascular stent within the right MCA M2 segment. Mild periventricular white matter changes likely related to small vessel ischemia. Mild superimposed global parenchymal atrophy. Ex vacuo dilatation of right lateral ventricle. No extra-axial collection. No mass effect or midline shift. Calcific atherosclerosis. ORBITS: No acute abnormality. SINUSES: Right maxillary sinus mucosal thickening. SOFT TISSUES AND SKULL: No acute soft tissue abnormality. No skull fracture. IMPRESSION: 1. Large remote right MCA territory infarct with ex vacuo dilatation of the right lateral ventricle and a vascular stent in the right MCA M2 segment. 2. Mild chronic microvascular ischemic white matter changes. 3. Mild global cerebral parenchymal atrophy. 4. Calcific atherosclerosis. 5. Right maxillary sinus mucosal thickening. Electronically signed by: Dorethia Molt MD 09/22/2024 08:07 PM EST RP Workstation: [id number]   DG Chest Port 1 View Result Date: 09/22/2024 EXAM: 1 VIEW(S) XRAY OF THE CHEST 09/22/2024 05:45:00 PM COMPARISON: None available. CLINICAL HISTORY: Hypoxia FINDINGS: LUNGS AND PLEURA: Low lung volumes. No focal pulmonary opacity. No pleural effusion. No pneumothorax. HEART AND MEDIASTINUM: Aortic calcification. No acute abnormality of the cardiac and mediastinal silhouettes. BONES AND SOFT TISSUES: No acute osseous abnormality. UPPER ABDOMEN: Gastric gaseous distention. IMPRESSION: 1. No acute cardiopulmonary abnormality. 2. Low lung volumes. 3. Aortic calcification. Electronically signed by: Dorethia Molt MD 09/22/2024 08:05 PM EST RP Workstation: [id number]      IMPRESSION AND PLAN:  Assessment and Plan: * Acute metabolic encephalopathy - The patient was admitted to a medical telemetry bed. - Will follow neurochecks every hours for 24 hours. - We will obtain a brain MRI without contrast for further assessment for acute CVA given his aphasia. - This could certainly be related to the  AKI and UTI.  Elevated troponin - While this could be related to demand ischemia we will go ahead and place the patient for now on IV heparin  and follow serial troponins. - Will obtain a 2D echo. - I notified Dr. Wilburn about the patient.  Acute lower UTI - The patient will be placed on IV Rocephin  and will follow urine culture and sensitivity. - This is likely contributing to #1.  Acute kidney injury superimposed on chronic kidney disease - The patient will be hydrated with IV normal saline and will follow BMP. - Will avoid nephrotoxins. - This AKI is on CKD stage IIIais likely prerenal due to volume depletion and  dehydration.  Hyperkalemia - The patient will be given a dose of Lokelma  and will follow potassium level. - This is clearly secondary to his AKI  Essential hypertension - Continue antihypertensive therapy.  Coronary artery disease - Will continue aspirin  and Plavix  as well as statin therapy.  Dyslipidemia - Will continue statin therapy.  Gout - Will continue allopurinol .  Depression - Will continue Zoloft  and Abilify .  GERD without esophagitis - Continue PPI therapy.  Seizure disorder (HCC) - Will continue Keppra .  Type 2 diabetes mellitus with peripheral neuropathy (HCC) - The patient will be placed on supplemental coverage with NovoLog . - Will continue Neurontin .   DVT prophylaxis: Lovenox .  Advanced Care Planning:  Code Status: The patient is DNR and DNI.  He has an out of facility DNR form.  Family Communication:  The plan of care was discussed in details with the patient (and family). I answered all questions. The patient agreed to proceed with the above mentioned plan. Further management will depend upon hospital course. Disposition Plan: Back to previous home environment Consults called: none.  All the records are reviewed and case discussed with ED provider.  Status is: Inpatient  At the time of the admission, it appears that the appropriate  admission status for this patient is inpatient.  This is judged to be reasonable and necessary in order to provide the required intensity of service to ensure the patient's safety given the presenting symptoms, physical exam findings and initial radiographic and laboratory data in the context of comorbid conditions.  The patient requires inpatient status due to high intensity of service, high risk of further deterioration and high frequency of surveillance required.  I certify that at the time of admission, it is my clinical judgment that the patient will require inpatient hospital care extending more than 2 midnights.                            Dispo: The patient is from: SNF              Anticipated d/c is to: SNF              Patient currently is not medically stable to d/c.              Difficult to place patient: No  [place]ison DELENA Peaches M.D on 09/23/2024 at 7:17 AM  [facility]   From 7 PM-7 AM, contact night-coverage [email or web address]  CC: Primary care physician; Pcp, No     [1] No Known Allergies  "

## 2024-09-22 NOTE — Progress Notes (Signed)
 PHARMACY - ANTICOAGULATION CONSULT NOTE  Pharmacy Consult for Heparin   Indication:  ACS/NSTEMI   Allergies[1]  Patient Measurements: Height: 5' 9 (175.3 cm) Weight: 77 kg (169 lb 12.1 oz) IBW/kg (Calculated) : 70.7 HEPARIN  DW (KG): 77  Vital Signs: Temp: 97.4 F (36.3 C) (12/29 2138) Temp Source: Rectal (12/29 1748) BP: 103/60 (12/29 2138) Pulse Rate: 58 (12/29 2138)  Labs: Recent Labs    09/22/24 1758  HGB 11.1*  HCT 34.9*  PLT 194  CREATININE 5.40*    Estimated Creatinine Clearance: 12.5 mL/min (A) (by C-G formula based on SCr of 5.4 mg/dL (H)).   Medical History: 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   a.) s/p mechanical thrombectomy + stenting (2.5 x 15 mm Resolute Onyx) + TPA   Aortic atherosclerosis    Atrial fibrillation (HCC)    a.) CHA2DS2VASc = 7 (age, CHF, HTN, CVA x2, vascular disease history, T2DM);  b.) rate/rhythm maintained on oral labetalol ; chronically anticoagulated with ASA + clopidogrel    Basal cell carcinoma    Bilateral carotid artery stenosis    a.) CTA neck 04/05/2020: 40-50% LICA   CAD (coronary artery disease)    Chronic gingivitis    Chronic systolic (congestive) heart failure (HCC)    a.) TTE 04/06/2020: EF 50-55%, mod LAE, mod AoV sclerosis/calc, triv TR/PR, mild MR, G2DD; b.) TTE 08/29/2021: EF >55%, mod LAE, triv AR/TR/PR. mild MR, G1DD.   Cocaine use    a.) last UDS (+) on 04/05/2020   Constipation    DOE (dyspnea on exertion)    Functional dyspepsia    Gout    Hemorrhoids    History of aspiration pneumonitis    History of ETOH abuse    HLD (hyperlipidemia)    Hypertension    Ileus (HCC)    Infection due to ESBL-producing Escherichia coli 08/02/2022   Left hemiplegia (HCC) 04/05/2020   a.) s/p RIGHT MCA CVA   Long term current use of antithrombotics/antiplatelets    a.) on DAPT (ASA + clopidogrel )   Major depressive disorder    Muscle weakness    Nephrolithiasis    PVD  (peripheral vascular disease)    SBO (small bowel obstruction) (HCC) 07/23/2022   Seborrhea capitis    T2DM (type 2 diabetes mellitus) (HCC)    Vitamin D deficiency     Medications:  Medications Prior to Admission  Medication Sig Dispense Refill Last Dose/Taking   acetaminophen  (TYLENOL ) 500 MG tablet Take 1,000 mg by mouth 2 (two) times daily.   09/22/2024 Morning   allopurinol  (ZYLOPRIM ) 100 MG tablet Take 100 mg by mouth daily.   09/22/2024 Morning   amLODipine  (NORVASC ) 10 MG tablet Take 10 mg by mouth daily.   09/22/2024 Morning   ARIPiprazole  (ABILIFY ) 2 MG tablet Take 2 mg by mouth at bedtime.   09/21/2024 Bedtime   aspirin  81 MG chewable tablet Chew 1 tablet (81 mg total) by mouth daily.   09/22/2024 Morning   atorvastatin  (LIPITOR ) 40 MG tablet Take 1 tablet (40 mg total) by mouth every evening.   09/21/2024 Evening   camphor-menthol  (SARNA) lotion Apply 1 Application topically as needed for itching.   09/22/2024 Noon   carboxymethylcellulose (REFRESH PLUS) 0.5 % SOLN Place 1 drop into both eyes 3 (three) times daily.   09/22/2024 Morning   cefTRIAXone  (ROCEPHIN ) 1 g injection Inject 1 g into the muscle daily.   09/22/2024 Noon   clopidogrel  (PLAVIX ) 75 MG tablet Take 1 tablet (75  mg total) by mouth daily.   09/22/2024 Morning   furosemide  (LASIX ) 20 MG tablet Take 20 mg by mouth daily.   09/22/2024 Morning   gabapentin  (NEURONTIN ) 300 MG capsule Take 300 mg by mouth 3 (three) times daily.   09/22/2024 Morning   guaiFENesin  (MUCINEX ) 600 MG 12 hr tablet Take 600 mg by mouth 2 (two) times daily.   09/22/2024 Morning   ipratropium-albuterol  (DUONEB) 0.5-2.5 (3) MG/3ML SOLN Inhale 3 mLs into the lungs every 4 (four) hours as needed.   09/22/2024 Noon   labetalol  (NORMODYNE ) 100 MG tablet Take 1 tablet (100 mg total) by mouth 3 (three) times daily. (Patient taking differently: Take 100 mg by mouth 3 (three) times daily. Hold for SBP 100 or less or pulse less than 60, notify provider)    09/21/2024 Evening   lactulose  (CHRONULAC ) 10 GM/15ML solution Take 20 g by mouth 2 (two) times daily as needed for mild constipation.   Unknown   levETIRAcetam  (KEPPRA ) 500 MG tablet Take 500 mg by mouth 2 (two) times daily.   09/22/2024 Morning   lisinopril  (ZESTRIL ) 40 MG tablet Take 40 mg by mouth at bedtime.   09/21/2024 Bedtime   oxybutynin  (DITROPAN -XL) 5 MG 24 hr tablet Take 1 tablet (5 mg total) by mouth at bedtime.   09/21/2024 Bedtime   pantoprazole  (PROTONIX ) 40 MG tablet Take 1 tablet (40 mg total) by mouth 2 (two) times daily.   09/22/2024 Morning   polyethylene glycol (MIRALAX  / GLYCOLAX ) 17 g packet Take 17 g by mouth daily as needed for mild constipation. 14 each 0 09/22/2024 Morning   predniSONE (DELTASONE) 20 MG tablet Take 20 mg by mouth daily.   09/21/2024 Evening   selenium sulfide (SELSUN) 1 % LOTN Apply 1 Application topically daily.   09/22/2024 Noon   senna-docusate (SENOKOT-S) 8.6-50 MG tablet Take 2 tablets by mouth at bedtime.   09/21/2024 Bedtime   sertraline  (ZOLOFT ) 100 MG tablet Take 100 mg by mouth daily.   09/22/2024 Morning   sertraline  (ZOLOFT ) 25 MG tablet Take 25 mg by mouth daily.   09/22/2024 Morning   sertraline  (ZOLOFT ) 50 MG tablet Take 150 mg by mouth daily.   09/22/2024 Morning   SYSTANE BALANCE 0.6 % SOLN Apply 1 drop to eye at bedtime. Both eyes   09/21/2024 Bedtime   Vitamin D, Ergocalciferol, (DRISDOL) 1.25 MG (50000 UNIT) CAPS capsule Take 50,000 Units by mouth every 7 (seven) days. Mondays   09/22/2024 Noon    Assessment: Pharmacy consulted to dose heparin  in this 73 year old male admitted with ACS/NSTEMI.  No prior anticoag noted.   CrCl = 12.5 ml/min   Goal of Therapy:  HL :  0.3 - 0.7 Monitor platelets by anticoagulation protocol: Yes   Plan:  Give 4000 units bolus x 1 Start heparin  infusion at 1050 units/hr Check anti-Xa level in 8 hours and daily while on heparin  Continue to monitor H&H and platelets  Eudell Mcphee  D 09/22/2024,10:50 PM      [1] No Known Allergies

## 2024-09-23 ENCOUNTER — Inpatient Hospital Stay: Admit: 2024-09-23

## 2024-09-23 ENCOUNTER — Inpatient Hospital Stay (HOSPITAL_COMMUNITY)
Admit: 2024-09-23 | Discharge: 2024-09-23 | Disposition: A | Discharge status: Home / Self Care | Attending: Family Medicine | Admitting: Family Medicine

## 2024-09-23 ENCOUNTER — Inpatient Hospital Stay

## 2024-09-23 DIAGNOSIS — R7989 Other specified abnormal findings of blood chemistry: Secondary | ICD-10-CM

## 2024-09-23 DIAGNOSIS — I2510 Atherosclerotic heart disease of native coronary artery without angina pectoris: Secondary | ICD-10-CM

## 2024-09-23 LAB — BLOOD CULTURE ID PANEL (REFLEXED) - BCID2

## 2024-09-23 LAB — CBC
HCT: 36.1 % — ABNORMAL LOW (ref 39.0–52.0)
Hemoglobin: 11.2 g/dL — ABNORMAL LOW (ref 13.0–17.0)
MCH: 29.7 pg (ref 26.0–34.0)
MCHC: 31 g/dL (ref 30.0–36.0)
MCV: 95.8 fL (ref 80.0–100.0)
Platelets: 169 K/uL (ref 150–400)
RBC: 3.77 MIL/uL — ABNORMAL LOW (ref 4.22–5.81)
RDW: 13.2 % (ref 11.5–15.5)
WBC: 10 K/uL (ref 4.0–10.5)
nRBC: 0 % (ref 0.0–0.2)

## 2024-09-23 LAB — ECHOCARDIOGRAM COMPLETE
Est EF: >55
Height: 69 in
S' Lateral: 3.3 cm
Weight: 2704 [oz_av]

## 2024-09-23 LAB — BASIC METABOLIC PANEL WITH GFR
Anion gap: 22 — ABNORMAL HIGH (ref 5–15)
BUN: 98 mg/dL — ABNORMAL HIGH (ref 8–23)
CO2: 17 mmol/L — ABNORMAL LOW (ref 22–32)
Calcium: 9.4 mg/dL (ref 8.9–10.3)
Chloride: 103 mmol/L (ref 98–111)
Creatinine, Ser: 5.33 mg/dL — ABNORMAL HIGH (ref 0.61–1.24)
GFR, Estimated: 11 mL/min — ABNORMAL LOW
Glucose, Bld: 110 mg/dL — ABNORMAL HIGH (ref 70–99)
Potassium: 4.8 mmol/L (ref 3.5–5.1)
Sodium: 142 mmol/L (ref 135–145)

## 2024-09-23 LAB — GLUCOSE, CAPILLARY
Glucose-Capillary: 94 mg/dL (ref 70–99)
Glucose-Capillary: 75 mg/dL (ref 70–99)
Glucose-Capillary: 119 mg/dL — ABNORMAL HIGH (ref 70–99)
Glucose-Capillary: 177 mg/dL — ABNORMAL HIGH (ref 70–99)

## 2024-09-23 LAB — HEMOGLOBIN A1C
Hgb A1c MFr Bld: 5.6 % (ref 4.8–5.6)
Mean Plasma Glucose: 114.02 mg/dL

## 2024-09-23 LAB — HEPARIN LEVEL (UNFRACTIONATED): Heparin Unfractionated: 0.59 [IU]/mL (ref 0.30–0.70)

## 2024-09-23 LAB — TROPONIN T, HIGH SENSITIVITY: Troponin T High Sensitivity: 147 ng/L (ref 0–19)

## 2024-09-23 MED ORDER — VANCOMYCIN VARIABLE DOSE PER UNSTABLE RENAL FUNCTION (PHARMACIST DOSING): Status: DC

## 2024-09-23 MED ORDER — SODIUM CHLORIDE 0.9 % IV SOLN
2.0000 g | INTRAVENOUS | Status: DC
  Administered 2024-09-23 – 2024-09-25 (×3): 2 g via INTRAVENOUS
  Filled 2024-09-23 (×4): qty 20

## 2024-09-23 MED ORDER — LACTULOSE 10 GM/15ML PO SOLN: 20.0000 g | Freq: Two times a day (BID) | ORAL | Status: DC | PRN

## 2024-09-23 MED ORDER — LEVETIRACETAM 500 MG PO TABS: 500.0000 mg | ORAL_TABLET | ORAL | Status: DC

## 2024-09-23 MED ORDER — SERTRALINE HCL 50 MG PO TABS
175.0000 mg | ORAL_TABLET | Freq: Every day | ORAL | Status: DC
  Administered 2024-09-24: 175 mg via ORAL
  Filled 2024-09-23: qty 4

## 2024-09-23 NOTE — Plan of Care (Signed)

## 2024-09-23 NOTE — Progress Notes (Signed)
 Pharmacy Antibiotic Note  Rodney Estrada is a 73 y.o. male admitted on 09/22/2024 with bacteremia.  Pharmacy has been consulted for vancomycin  dosing.  -Blood cx 12/29: 1 of 4 (anaerobic)- GPC: staph epi w/ mecA resistance  -also with possible UTI, on ceftriaxone   Plan: Patient received Vancomycin  1750 mg IV x 1 on 12/29 @ 2307 Scr 5.33  Crcl 12.7 ml/min With unstable renal fxn will check a random vancomycin  level ~ 24 hours after dose. Further dosing to be based on level  Continue to follow renal fxn, cultures, length of therapy, etc   Height: 5' 9 (175.3 cm) Weight: 77 kg (169 lb 12.1 oz) IBW/kg (Calculated) : 70.7  Temp (24hrs), Avg:97.7 F (36.5 C), Min:97 F (36.1 C), Max:98.4 F (36.9 C)  Recent Labs  Lab 09/22/24 1758 09/23/24 0628  WBC 11.4* 10.0  CREATININE 5.40* 5.33*  LATICACIDVEN 1.0  --     Estimated Creatinine Clearance: 12.7 mL/min (A) (by C-G formula based on SCr of 5.33 mg/dL (H)).    Allergies[1]  Antimicrobials this admission: Cefepime  x 1 12/29  Ceftriaxone  12/30 >>   Vancomycin   12/29(evening) >>    Dose adjustments this admission:    Microbiology results: Blood cx 12/29: 1 of 4 (anaerobic)- GPC: staph epi w/ mecA resistance    UCx:      Sputum:      MRSA PCR:    Thank you for allowing pharmacy to be a part of this patients care.  Allean Haas PharmD Clinical Pharmacist 09/23/2024      [1] No Known Allergies

## 2024-09-23 NOTE — Assessment & Plan Note (Signed)
-   While this could be related to demand ischemia we will go ahead and place the patient for now on IV heparin  and follow serial troponins. - Will obtain a 2D echo. - I notified Dr. Wilburn about the patient.

## 2024-09-23 NOTE — Progress Notes (Signed)
 MEWS Progress Note  Patient Details Name: Delonte Musich MRN: [id number] DOB: 10-13-1950 Today's Date: 09/23/2024   MEWS Flowsheet Documentation:  Assess: MEWS Score Temp: 99.1 F (37.3 C) BP: 92/62 MAP (mmHg): 72 Pulse Rate: 70 ECG Heart Rate: (!) 55 Resp: 18 Level of Consciousness: Alert SpO2: 95 % O2 Device: Nasal Cannula O2 Flow Rate (L/min): 4 L/min Assess: MEWS Score MEWS Temp: 0 MEWS Systolic: 1 MEWS Pulse: 0 MEWS RR: 0 MEWS LOC: 0 MEWS Score: 1 MEWS Score Color: Green Assess: SIRS CRITERIA SIRS Temperature : 0 SIRS Respirations : 0 SIRS Pulse: 0 SIRS WBC: 0 SIRS Score Sum : 0 SIRS Temperature : 0 SIRS Pulse: 0 SIRS Respirations : 0 SIRS WBC: 0 SIRS Score Sum : 0 Assess: if the MEWS score is Yellow or Red Were vital signs accurate and taken at a resting state?: Yes Does the patient meet 2 or more of the SIRS criteria?: No MEWS guidelines implemented : No, previously yellow, continue vital signs every 4 hours Notify: Charge Nurse/RN Name of Charge Nurse/RN Notified: Lonell OBIE Lamar Jerilynn 09/23/2024, 9:08 PM

## 2024-09-23 NOTE — TOC Initial Note (Signed)
 Transition of Care [facility]) - Initial/Assessment Note    Patient Details  Name: Rodney Estrada MRN: [id number] Date of Birth: 12-02-1950  Transition of Care [facility]) CM/SW Contact:    Nathanael CHRISTELLA Ring, RN Phone Number: 09/23/2024, 11:32 AM  Clinical Narrative:                 Patient admitted with Acute metabolic encephalopathy, CM met with patient at the bedside, he is alert asking for water  and saying he is thristy. Confirmed with Brianna from [facility] that he is LTC at [facility].  Plan will be for return at [place].   Expected Discharge Plan: Skilled Nursing Facility Barriers to Discharge: Continued Medical Work up   Patient Goals and CMS Choice            Expected Discharge Plan and Services   Discharge Planning Services: CM Consult   Living arrangements for the past 2 months: Skilled Nursing Facility                 DME Arranged: N/A         HH Arranged: NA          Prior Living Arrangements/Services Living arrangements for the past 2 months: Skilled Nursing Facility Lives with:: Facility Resident Patient language and need for interpreter reviewed:: Yes Do you feel safe going back to the place where you live?: Yes      Need for Family Participation in Patient Care: Yes (Comment) Care giver support system in place?: Yes (comment)   Criminal Activity/Legal Involvement Pertinent to Current Situation/Hospitalization: No - Comment as needed  Activities of Daily Living      Permission Sought/Granted Permission sought to share information with : Photographer granted to share info w AGENCY: [facility]        Emotional Assessment Appearance:: Disheveled, Appears stated age Attitude/Demeanor/Rapport: Engaged Affect (typically observed): Accepting Orientation: : Oriented to Self Alcohol  / Substance Use: Not Applicable Psych Involvement: No (comment)  Admission diagnosis:  Acute metabolic encephalopathy [G93.41] Patient Active Problem  List   Diagnosis Date Noted   Elevated troponin 09/23/2024   Acute metabolic encephalopathy 09/22/2024   Acute lower UTI 09/22/2024   Acute kidney injury superimposed on chronic kidney disease 09/22/2024   Type 2 diabetes mellitus with peripheral neuropathy (HCC) 09/22/2024   Seizure disorder (HCC) 09/22/2024   GERD without esophagitis 09/22/2024   Depression 09/22/2024   Gout 09/22/2024   Dyslipidemia 09/22/2024   Hyperkalemia 09/22/2024   Coronary artery disease 09/22/2024   Hypernatremia 07/31/2022   Hiccups 07/28/2022   Hypokalemia 07/27/2022   SBO (small bowel obstruction) (HCC) 07/23/2022   Nephrolithiasis 07/23/2022   Partial intestinal obstruction (HCC)    Cerebral edema (HCC) 04/16/2020   Hypertensive emergency 04/16/2020   Essential hypertension 04/16/2020   Hyperlipidemia LDL goal <70 04/16/2020   Type 2 diabetes mellitus (HCC) 04/16/2020   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 04/16/2020   AKI (acute kidney injury) (HCC) in the setting of stage IIIa chronic kidney disease 04/16/2020   Tobacco use disorder 04/16/2020   Alcohol  abuse 04/16/2020   Cocaine abuse (HCC) 04/16/2020   Obesity 04/16/2020   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 04/05/2020   PCP:  Pcp, No Pharmacy:   [id number] - [place], [place] - [address] AT [facility] OF [address] [place] [address] Phone: [phone] Fax:  [phone]     Social Drivers of Health (SDOH) Social History: SDOH Screenings   Food Insecurity: Patient Unable To Answer (09/22/2024)  Housing: Patient Unable To Answer (09/22/2024)  Transportation Needs: Patient Unable To Answer (09/22/2024)  Utilities: Patient Unable To Answer (09/22/2024)  Social Connections: Patient Unable To Answer (09/22/2024)  Tobacco Use: High Risk (09/22/2024)   SDOH Interventions:     Readmission Risk Interventions    09/23/2024   11:15 AM   Readmission Risk Prevention Plan  Transportation Screening Complete  PCP or Specialist Appt within 3-5 Days Complete  HRI or Home Care Consult Complete  Social Work Consult for Recovery Care Planning/Counseling Complete  Palliative Care Screening Not Applicable  Medication Review Oceanographer) Complete

## 2024-09-23 NOTE — Hospital Course (Addendum)
 Hospital course / significant events:   HPI: from H&P - Rodney Estrada is a 73 y.o. male with medical history significant for coronary artery disease, chronic systolic CHF, gout, hypertension, dyslipidemia, atrial fibrillation and CVA as well as type 2 diabetes mellitus who presented to the emergency room 12/29 with acute onset of altered mental status with confusion and decreased responsiveness since Friday 12/26 at his SNF.  He has been nonverbal with increased tremors.  No reported fever or chills.  SNF staff noted congested cough and therefore the patient was given IM Rocephin  this afternoon.  The patient was nonverbal during admitting hospitalist's interview and would answer some questions nodding his head.  No chest pain or palpitations were reported.  No reported nausea or vomiting or diarrhea or abdominal pain.    12/29: to ED. (+)UTI, started on IV fluids, vanc, cefepime . Admitted to hospitalist for acute encephalopathy d/t UTI, AKI on CKD 3a assoc w/ hyperkalemia. Concern w/ elevation troponins, heparin  gtt started.  12/30: holding heparin  gtt given hematuria. Cardiology following, in agreement. Echo pending. Pt remains confused. Echo pending.      Consultants:  Cardiology  Procedures/Surgeries: none      ASSESSMENT & PLAN:   Acute metabolic encephalopathy Likely d/t UTI, uremia w/ AKI Treat underlying cause(s) as below MRI brain pending radiology read - appears stable from previous on personal review  Neurochecks  Check keppra  levels  Hold gabapentin  (300 mg tid home dose)   Acute lower UTI SEPSIS RULED OUT (WBC <4 or >12 - NO; Temp <96.8 or >100.4 - NO; HR >90 - NO; RR >20 - NO) IV Rocephin   Pending cultures  Staph epidermidis blood culture positive Concern for MRSA 1 out of 4 blood cultures positive, question contamination Covering with vancomycin  given severity of illness  Elevated troponin / Type II  likely demand ischemia  IV heparin  dc follow serial  troponins. Echo pending  Cardiology following w/ further recs pending echo  Acute kidney injury superimposed on chronic kidney disease stage 3A Likely prerenal d/t dehydration, UTI IV fluids Follow BMP Avoid nephrotoxic therapies    Gross hematuria Question due to UTI Heparin  DC Monitor CBC Consider CT abdomen/pelvis renal study if not resolving. If drop H/H or persistent/worsening blood, contact urology  Hyperkalemia D/t AKI Treat underlying cause(s) as noted  S/p lokelma , repeat as needed Follow BMP   Essential hypertension but low BP here  Hold amlodipine , lisinopril     Coronary artery disease HOD ASA, Plavix , statin Hold antiypertensives    Gout Hold allopurinol  w/ AKI   Depression Continue Zoloft  home dose 175 mg daily --> 150  Abilify     GERD without esophagitis PPI   Seizure disorder continue Keppra  Check keppra  levels   Constipation Bowel regimen per orders   Type 2 diabetes mellitus with peripheral neuropathy  Not on home antihyperglycemics No glucosuria, serum Glc <150 Holding SSI Monitor Glc achs for now and initiate insulin  if needed  A1C hold Neurontin  w/ AKI and encephalopathy   Prednisone rx outpatient started 12/26 20 mg daily NOT chronic medication Uncertain why but suspect as above he was treated for presumed respiratory illness at faciltiy - will dc this     overweight based on BMI: Body mass index is 25.07 kg/m.SABRA Significantly low or high BMI is associated with higher medical risk.  Underweight - under 18  overweight - 25 to 29 obese - 30 or more Class 1 obesity: BMI of 30.0 to 34 Class 2 obesity: BMI of 35.0 to  39 Class 3 obesity: BMI of 40.0 to 49 Super Morbid Obesity: BMI 50-59 Super-super Morbid Obesity: BMI 60+ Healthy nutrition and physical activity advised as adjunct to other disease management and risk reduction treatments    DVT prophylaxis: lovenox   IV fluids: no continuous IV fluids  Nutrition: carb modified   Central lines / other devices: None  Code Status: DNR/DNI ACP documentation reviewed:  none on file in [facility]  TOC needs: Anticipate back to his facility when medically stable Medical barriers to dispo: Working up encephalopathy, await urine cultures. Expected medical readiness for discharge 1 to 2 days.SABRA

## 2024-09-23 NOTE — Progress Notes (Signed)
*  PRELIMINARY RESULTS* Echocardiogram 2D Echocardiogram has been performed.  Rodney Estrada 09/23/2024, 2:04 PM

## 2024-09-23 NOTE — Consult Note (Signed)
 " [name] CLINIC CARDIOLOGY CONSULT NOTE       Patient ID: Rodney Estrada MRN: [id number] DOB/AGE: May 11, 1951 73 y.o.  Admit date: 09/22/2024 Referring Physician Dr. Madison Peaches Primary Physician Pcp, No  Primary Cardiologist Dr. Hester (last seen 09/2021) Reason for Consultation elevated troponins  HPI: Rodney Estrada is a 73 y.o. male  with a past medical history of coronary artery disease by CT scan, chronic diastolic CHF, gout, hypertension, dyslipidemia, atrial fibrillation and hx CVA who presented to the ED on 09/22/2024 for altered mental status. Troponins mildly elevated and flat. Cardiology was consulted for further evaluation.   Patient brought to ED for evaluation of altered mental status from SNF. Workup in the ED notable for creatinine 5.4 (baseline 1.5 last year), potassium 5.2, hemoglobin 11.1, WBC 11.4. Troponins 187 > 154 > 147, BNP 789. EKG in the ED NSR rate 62 bpm, no acute ischemic changes. Started on IV heparin  but had frank blood from his urethra noted this AM.  At the time of my evaluation this AM, he is resting in hospital bed. He does not answer any questions. On chart review it appears he had been altered at his facility with worsening tremors as well as decreased responsiveness since Friday. Unclear what his baseline status is. There were no reports of CP, SOB from his facility.   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   a.) s/p mechanical thrombectomy + stenting (2.5 x 15 mm Resolute Onyx) + TPA   Aortic atherosclerosis    Atrial fibrillation (HCC)    a.) CHA2DS2VASc = 7 (age, CHF, HTN, CVA x2, vascular disease history, T2DM);  b.) rate/rhythm maintained on oral labetalol ; chronically anticoagulated with ASA + clopidogrel    Basal cell carcinoma    Bilateral carotid artery stenosis    a.) CTA neck 04/05/2020: 40-50% LICA   CAD (coronary artery  disease)    Chronic gingivitis    Chronic systolic (congestive) heart failure (HCC)    a.) TTE 04/06/2020: EF 50-55%, mod LAE, mod AoV sclerosis/calc, triv TR/PR, mild MR, G2DD; b.) TTE 08/29/2021: EF >55%, mod LAE, triv AR/TR/PR. mild MR, G1DD.   Cocaine use    a.) last UDS (+) on 04/05/2020   Constipation    DOE (dyspnea on exertion)    Functional dyspepsia    Gout    Hemorrhoids    History of aspiration pneumonitis    History of ETOH abuse    HLD (hyperlipidemia)    Hypertension    Ileus (HCC)    Infection due to ESBL-producing Escherichia coli 08/02/2022   Left hemiplegia (HCC) 04/05/2020   a.) s/p RIGHT MCA CVA   Long term current use of antithrombotics/antiplatelets    a.) on DAPT (ASA + clopidogrel )   Major depressive disorder    Muscle weakness    Nephrolithiasis    PVD (peripheral vascular disease)    SBO (small bowel obstruction) (HCC) 07/23/2022   Seborrhea capitis    T2DM (type 2 diabetes mellitus) (HCC)    Vitamin D deficiency     Past Surgical History:  Procedure Laterality Date   CYSTOSCOPY W/ URETERAL STENT REMOVAL Left 11/14/2022   Procedure: CYSTOSCOPY WITH STENT REMOVAL;  Surgeon: Twylla Glendia BROCKS, MD;  Location: [facility] OR[facility];  Service: Urology;  Laterality: Left;   CYSTOSCOPY/URETEROSCOPY/HOLMIUM LASER/STENT PLACEMENT Left 08/02/2022   Procedure: CYSTOSCOPY/URETEROSCOPY/HOLMIUM LASER/STENT PLACEMENT;  Surgeon: Twylla Glendia BROCKS,  MD;  Location: [facility];  Service: Urology;  Laterality: Left;   CYSTOSCOPY/URETEROSCOPY/HOLMIUM LASER/STENT PLACEMENT Left 08/29/2022   Procedure: CYSTOSCOPY/URETEROSCOPY/HOLMIUM LASER/STENT EXCHANGE;  Surgeon: Twylla Glendia BROCKS, MD;  Location: [facility];  Service: Urology;  Laterality: Left;   IR CT HEAD LTD  04/05/2020   IR FLUORO GUIDE CV LINE RIGHT  08/01/2022   IR INTRA CRAN STENT  04/05/2020   IR PERCUTANEOUS ART THROMBECTOMY/INFUSION INTRACRANIAL INC DIAG ANGIO  04/05/2020       IR PERCUTANEOUS ART THROMBECTOMY/INFUSION INTRACRANIAL  INC DIAG ANGIO  04/05/2020   IR US  GUIDE VASC ACCESS RIGHT  08/01/2022   RADIOLOGY WITH ANESTHESIA N/A 04/05/2020   Procedure: IR WITH ANESTHESIA;  Surgeon: Radiologist, Medication, MD;  Location: [facility] OR;  Service: Radiology;  Laterality: N/A;    Medications Prior to Admission  Medication Sig Dispense Refill Last Dose/Taking   acetaminophen  (TYLENOL ) 500 MG tablet Take 1,000 mg by mouth 2 (two) times daily.   09/22/2024 Morning   allopurinol  (ZYLOPRIM ) 100 MG tablet Take 100 mg by mouth daily.   09/22/2024 Morning   amLODipine  (NORVASC ) 10 MG tablet Take 10 mg by mouth daily.   09/22/2024 Morning   ARIPiprazole  (ABILIFY ) 2 MG tablet Take 2 mg by mouth at bedtime.   09/21/2024 Bedtime   aspirin  81 MG chewable tablet Chew 1 tablet (81 mg total) by mouth daily.   09/22/2024 Morning   atorvastatin  (LIPITOR ) 40 MG tablet Take 1 tablet (40 mg total) by mouth every evening.   09/21/2024 Evening   camphor-menthol  (SARNA) lotion Apply 1 Application topically as needed for itching.   09/22/2024 Noon   carboxymethylcellulose (REFRESH PLUS) 0.5 % SOLN Place 1 drop into both eyes 3 (three) times daily.   09/22/2024 Morning   cefTRIAXone  (ROCEPHIN ) 1 g injection Inject 1 g into the muscle daily.   09/22/2024 Noon   clopidogrel  (PLAVIX ) 75 MG tablet Take 1 tablet (75 mg total) by mouth daily.   09/22/2024 Morning   furosemide  (LASIX ) 20 MG tablet Take 20 mg by mouth daily.   09/22/2024 Morning   gabapentin  (NEURONTIN ) 300 MG capsule Take 300 mg by mouth 3 (three) times daily.   09/22/2024 Morning   guaiFENesin  (MUCINEX ) 600 MG 12 hr tablet Take 600 mg by mouth 2 (two) times daily.   09/22/2024 Morning   ipratropium-albuterol  (DUONEB) 0.5-2.5 (3) MG/3ML SOLN Inhale 3 mLs into the lungs every 4 (four) hours as needed.   09/22/2024 Noon   labetalol  (NORMODYNE ) 100 MG tablet Take 1 tablet (100 mg total) by mouth 3 (three) times daily. (Patient taking differently: Take 100 mg by mouth 3 (three) times daily. Hold for  SBP 100 or less or pulse less than 60, notify provider)   09/21/2024 Evening   lactulose  (CHRONULAC ) 10 GM/15ML solution Take 20 g by mouth 2 (two) times daily as needed for mild constipation.   Unknown   levETIRAcetam  (KEPPRA ) 500 MG tablet Take 500 mg by mouth 2 (two) times daily.   09/22/2024 Morning   lisinopril  (ZESTRIL ) 40 MG tablet Take 40 mg by mouth at bedtime.   09/21/2024 Bedtime   oxybutynin  (DITROPAN -XL) 5 MG 24 hr tablet Take 1 tablet (5 mg total) by mouth at bedtime.   09/21/2024 Bedtime   pantoprazole  (PROTONIX ) 40 MG tablet Take 1 tablet (40 mg total) by mouth 2 (two) times daily.   09/22/2024 Morning   polyethylene glycol (MIRALAX  / GLYCOLAX ) 17 g packet Take 17 g by mouth daily as needed for mild constipation. 14 each  0 09/22/2024 Morning   predniSONE (DELTASONE) 20 MG tablet Take 20 mg by mouth daily.   09/21/2024 Evening   selenium sulfide (SELSUN) 1 % LOTN Apply 1 Application topically daily.   09/22/2024 Noon   senna-docusate (SENOKOT-S) 8.6-50 MG tablet Take 2 tablets by mouth at bedtime.   09/21/2024 Bedtime   sertraline  (ZOLOFT ) 100 MG tablet Take 100 mg by mouth daily.   09/22/2024 Morning   sertraline  (ZOLOFT ) 25 MG tablet Take 25 mg by mouth daily.   09/22/2024 Morning   sertraline  (ZOLOFT ) 50 MG tablet Take 50 mg by mouth daily.   09/22/2024 Morning   SYSTANE BALANCE 0.6 % SOLN Apply 1 drop to eye at bedtime. Both eyes   09/21/2024 Bedtime   Vitamin D, Ergocalciferol, (DRISDOL) 1.25 MG (50000 UNIT) CAPS capsule Take 50,000 Units by mouth every 7 (seven) days. Mondays   09/22/2024 Noon   Social History   Socioeconomic History   Marital status: Single    Spouse name: Not on file   Number of children: Not on file   Years of education: Not on file   Highest education level: Not on file  Occupational History   Not on file  Tobacco Use   Smoking status: Every Day   Smokeless tobacco: Never  Substance and Sexual Activity   Alcohol  use: Yes    Alcohol /week: 10.0  standard drinks of alcohol     Types: 10 Cans of beer per week    Comment: every DAy   Drug use: Not Currently   Sexual activity: Not on file  Other Topics Concern   Not on file  Social History Narrative   Not on file   Social Drivers of Health   Tobacco Use: High Risk (09/22/2024)   Patient History    Smoking Tobacco Use: Every Day    Smokeless Tobacco Use: Never    Passive Exposure: Not on file  Financial Resource Strain: Not on file  Food Insecurity: Patient Unable To Answer (09/22/2024)   Epic    Worried About Programme Researcher, Broadcasting/film/video in the Last Year: Patient unable to answer    Ran Out of Food in the Last Year: Patient unable to answer  Transportation Needs: Patient Unable To Answer (09/22/2024)   Epic    Lack of Transportation (Medical): Patient unable to answer    Lack of Transportation (Non-Medical): Patient unable to answer  Physical Activity: Not on file  Stress: Not on file  Social Connections: Patient Unable To Answer (09/22/2024)   Social Connection and Isolation Panel    Frequency of Communication with Friends and Family: Patient unable to answer    Frequency of Social Gatherings with Friends and Family: Patient unable to answer    Attends Religious Services: Patient unable to answer    Active Member of Clubs or Organizations: Patient unable to answer    Attends Banker Meetings: Patient unable to answer    Marital Status: Patient unable to answer  Intimate Partner Violence: Patient Unable To Answer (09/22/2024)   Epic    Fear of Current or Ex-Partner: Patient unable to answer    Emotionally Abused: Patient unable to answer    Physically Abused: Patient unable to answer    Sexually Abused: Patient unable to answer  Depression (EYV7-0): Not on file  Alcohol  Screen: Not on file  Housing: Patient Unable To Answer (09/22/2024)   Epic    Unable to Pay for Housing in the Last Year: Patient unable to answer  Number of Times Moved in the Last Year: Not  on file    Homeless in the Last Year: Patient unable to answer  Utilities: Patient Unable To Answer (09/22/2024)   Epic    Threatened with loss of utilities: Patient unable to answer  Health Literacy: Not on file    Family History  Problem Relation Age of Onset   Hypertension Mother    Hypertension Father      Vitals:   09/23/24 0100 09/23/24 0516 09/23/24 0821 09/23/24 1026  BP: (!) 96/58 110/64 95/60 (!) 81/56  Pulse: (!) 55 (!) 50 (!) 112 (!) 58  Resp:  18  19  Temp: (!) 97.3 F (36.3 C) 97.8 F (36.6 C) (!) 97.5 F (36.4 C) 98.2 F (36.8 C)  TempSrc: Oral  Oral   SpO2: 94% 98% (!) 78% 99%  Weight:      Height:        PHYSICAL EXAM General: Chronically ill appearing male, well nourished, in no acute distress. HEENT: Normocephalic and atraumatic. Neck: No JVD.  Lungs: Normal respiratory effort on 4L [place]. Clear bilaterally to auscultation. No wheezes, crackles, rhonchi.  Heart: HRRR. Normal S1 and S2 without gallops or murmurs.  Abdomen: Non-distended appearing.  Msk: Normal strength and tone for age. Extremities: Warm and well perfused. No clubbing, cyanosis. No edema.  Neuro: Alert and oriented X 3. Psych: Answers questions appropriately.   Labs: Basic Metabolic Panel: Recent Labs    09/22/24 1758 09/23/24 0628  NA 141 142  K 5.2* 4.8  CL 104 103  CO2 17* 17*  GLUCOSE 124* 110*  BUN 93* 98*  CREATININE 5.40* 5.33*  CALCIUM  9.6 9.4   Liver Function Tests: Recent Labs    09/22/24 1758  AST 13*  ALT 11  ALKPHOS 70  BILITOT 0.3  PROT 7.1  ALBUMIN 4.1   No results for input(s): LIPASE, AMYLASE in the last 72 hours. CBC: Recent Labs    09/22/24 1758 09/23/24 0628  WBC 11.4* 10.0  NEUTROABS 9.1*  --   HGB 11.1* 11.2*  HCT 34.9* 36.1*  MCV 94.8 95.8  PLT 194 169   Cardiac Enzymes: No results for input(s): CKTOTAL, CKMB, CKMBINDEX, TROPONINIHS in the last 72 hours. BNP: No results for input(s): BNP in the last 72  hours. D-Dimer: No results for input(s): DDIMER in the last 72 hours. Hemoglobin A1C: No results for input(s): HGBA1C in the last 72 hours. Fasting Lipid Panel: No results for input(s): CHOL, HDL, LDLCALC, TRIG, CHOLHDL, LDLDIRECT in the last 72 hours. Thyroid Function Tests: No results for input(s): TSH, T4TOTAL, T3FREE, THYROIDAB in the last 72 hours.  Invalid input(s): FREET3 Anemia Panel: No results for input(s): VITAMINB12, FOLATE, FERRITIN, TIBC, IRON, RETICCTPCT in the last 72 hours.   Radiology: CT Head Wo Contrast Result Date: 09/22/2024 EXAM: CT HEAD WITHOUT CONTRAST 09/22/2024 06:47:02 PM TECHNIQUE: CT of the head was performed without the administration of intravenous contrast. Automated exposure control, iterative reconstruction, and/or weight based adjustment of the mA/kV was utilized to reduce the radiation dose to as low as reasonably achievable. COMPARISON: None available. CLINICAL HISTORY: Mental status change, unknown cause FINDINGS: BRAIN AND VENTRICLES: No acute hemorrhage. Large remote right MCA territory infarct. Vascular stent within the right MCA M2 segment. Mild periventricular white matter changes likely related to small vessel ischemia. Mild superimposed global parenchymal atrophy. Ex vacuo dilatation of right lateral ventricle. No extra-axial collection. No mass effect or midline shift. Calcific atherosclerosis. ORBITS: No acute abnormality. SINUSES: Right  maxillary sinus mucosal thickening. SOFT TISSUES AND SKULL: No acute soft tissue abnormality. No skull fracture. IMPRESSION: 1. Large remote right MCA territory infarct with ex vacuo dilatation of the right lateral ventricle and a vascular stent in the right MCA M2 segment. 2. Mild chronic microvascular ischemic white matter changes. 3. Mild global cerebral parenchymal atrophy. 4. Calcific atherosclerosis. 5. Right maxillary sinus mucosal thickening. Electronically signed by: Dorethia Molt MD 09/22/2024 08:07 PM EST RP Workstation: [id number]   DG Chest Port 1 View Result Date: 09/22/2024 EXAM: 1 VIEW(S) XRAY OF THE CHEST 09/22/2024 05:45:00 PM COMPARISON: None available. CLINICAL HISTORY: Hypoxia FINDINGS: LUNGS AND PLEURA: Low lung volumes. No focal pulmonary opacity. No pleural effusion. No pneumothorax. HEART AND MEDIASTINUM: Aortic calcification. No acute abnormality of the cardiac and mediastinal silhouettes. BONES AND SOFT TISSUES: No acute osseous abnormality. UPPER ABDOMEN: Gastric gaseous distention. IMPRESSION: 1. No acute cardiopulmonary abnormality. 2. Low lung volumes. 3. Aortic calcification. Electronically signed by: Dorethia Molt MD 09/22/2024 08:05 PM EST RP Workstation: [id number]    ECHO ordered  TELEMETRY (personally reviewed): NSR rate 60s  EKG (personally reviewed): NSR rate 62 bpm, no acute ischemic changes  Data reviewed by me 09/23/2024: last 24h vitals tele labs imaging I/O ED provider note, admission H&P  Principal Problem:   Acute metabolic encephalopathy Active Problems:   Essential hypertension   Acute lower UTI   Acute kidney injury superimposed on chronic kidney disease   Type 2 diabetes mellitus with peripheral neuropathy (HCC)   Seizure disorder (HCC)   GERD without esophagitis   Depression   Gout   Dyslipidemia   Hyperkalemia   Coronary artery disease   Elevated troponin    ASSESSMENT AND PLAN:  Rodney Estrada is a 73 y.o. male  with a past medical history of coronary artery disease by CT scan, chronic diastolic CHF, gout, hypertension, dyslipidemia, atrial fibrillation and hx CVA who presented to the ED on 09/22/2024 for altered mental status. Troponins mildly elevated and flat. Cardiology was consulted for further evaluation.   # AKI on CKD # UTI # Acute metabolic encephalopathy # Demand ischemia # Chronic HFpEF # CAD by CT scan Patient presented with AMS from facility. Cr up to 5.4 from baseline around 1.4-1.5. No  complaints of CP. Troponins 187 > 154 > 147. -Echo ordered, further recommendations pending these results.  -Holding heparin  given active bleeding from urethra this AM. -Continue aspirin  81 mg daily, plavix  75 mg daily, and atorvastatin  40 mg daily.  -Can hold home labetolol given hypotension.  -Mild and flat troponins most consistent with demand/supply mismatch and not ACS in the setting of severe AKI.     This patient's plan of care was discussed and created with Dr. Wilburn and he is in agreement.  Signed: Danita Bloch, PA-C  09/23/2024, 10:37 AM [facility]      "

## 2024-09-23 NOTE — Progress Notes (Addendum)
 " PROGRESS NOTE    Rodney Estrada   FMW:[id number] DOB: [birth year]  DOA: 09/22/2024 Date of Service: 09/23/2024 which is hospital day 1  PCP: Pcp, No    Hospital course / significant events:   HPI: from H&P - Rodney Estrada is a 73 y.o. male with medical history significant for coronary artery disease, chronic systolic CHF, gout, hypertension, dyslipidemia, atrial fibrillation and CVA as well as type 2 diabetes mellitus who presented to the emergency room 12/29 with acute onset of altered mental status with confusion and decreased responsiveness since Friday 12/26 at his SNF.  He has been nonverbal with increased tremors.  No reported fever or chills.  SNF staff noted congested cough and therefore the patient was given IM Rocephin  this afternoon.  The patient was nonverbal during admitting hospitalist's interview and would answer some questions nodding his head.  No chest pain or palpitations were reported.  No reported nausea or vomiting or diarrhea or abdominal pain.    12/29: to ED. (+)UTI, started on IV fluids, vanc, cefepime . Admitted to hospitalist for acute encephalopathy d/t UTI, AKI on CKD 3a assoc w/ hyperkalemia. Concern w/ elevation troponins, heparin  gtt started.  12/30: holding heparin  gtt given hematuria. Cardiology following, in agreement. Echo pending. Pt remains confused. Echo pending.      Consultants:  Cardiology  Procedures/Surgeries: none      ASSESSMENT & PLAN:   Acute metabolic encephalopathy Likely d/t UTI, uremia w/ AKI Treat underlying cause(s) as below MRI brain pending radiology read - appears stable from previous on personal review  Neurochecks  Check keppra  levels  Hold gabapentin  (300 mg tid home dose)   Acute lower UTI SEPSIS RULED OUT (WBC <4 or >12 - NO; Temp <96.8 or >100.4 - NO; HR >90 - NO; RR >20 - NO) IV Rocephin   Pending cultures  Staph epidermidis blood culture positive Concern for MRSA 1 out of 4 blood cultures positive, question  contamination Covering with vancomycin  given severity of illness  Elevated troponin / Type II  likely demand ischemia  IV heparin  dc follow serial troponins. Echo pending  Cardiology following w/ further recs pending echo  Acute kidney injury superimposed on chronic kidney disease stage 3A Likely prerenal d/t dehydration, UTI IV fluids Follow BMP Avoid nephrotoxic therapies    Gross hematuria Question due to UTI Heparin  DC Monitor CBC Consider CT abdomen/pelvis renal study if not resolving. If drop H/H or persistent/worsening blood, contact urology  Hyperkalemia D/t AKI Treat underlying cause(s) as noted  S/p lokelma , repeat as needed Follow BMP   Essential hypertension but low BP here  Hold amlodipine , lisinopril     Coronary artery disease HOD ASA, Plavix , statin Hold antiypertensives    Gout Hold allopurinol  w/ AKI   Depression Continue Zoloft  home dose 175 mg daily --> 150  Abilify     GERD without esophagitis PPI   Seizure disorder continue Keppra  Check keppra  levels   Constipation Bowel regimen per orders   Type 2 diabetes mellitus with peripheral neuropathy  Not on home antihyperglycemics No glucosuria, serum Glc <150 Holding SSI Monitor Glc achs for now and initiate insulin  if needed  A1C hold Neurontin  w/ AKI and encephalopathy   Prednisone rx outpatient started 12/26 20 mg daily NOT chronic medication Uncertain why but suspect as above he was treated for presumed respiratory illness at faciltiy - will dc this     overweight based on BMI: Body mass index is 25.07 kg/m.SABRA Significantly low or high BMI is associated with higher medical  risk.  Underweight - under 18  overweight - 25 to 29 obese - 30 or more Class 1 obesity: BMI of 30.0 to 34 Class 2 obesity: BMI of 35.0 to 39 Class 3 obesity: BMI of 40.0 to 49 Super Morbid Obesity: BMI 50-59 Super-super Morbid Obesity: BMI 60+ Healthy nutrition and physical activity advised as adjunct  to other disease management and risk reduction treatments    DVT prophylaxis: lovenox   IV fluids: no continuous IV fluids  Nutrition: carb modified  Central lines / other devices: None  Code Status: DNR/DNI ACP documentation reviewed:  none on file in [facility]  TOC needs: Anticipate back to his facility when medically stable Medical barriers to dispo: Working up encephalopathy, await urine cultures. Expected medical readiness for discharge 1 to 2 days..              Subjective / Brief ROS:  Patient is alert, tracks with eyes, follows commands, he is not verbalizing in response to any questions.   Family Communication: will call family once we have MRI results back later today, if results aren't forthcoming will still will call to update.    Objective Findings:  Vitals:   09/23/24 0821 09/23/24 1026 09/23/24 1210 09/23/24 1541  BP: 95/60 (!) 81/56 (!) 88/53 (!) 87/76  Pulse: (!) 112 (!) 58 60 95  Resp:  19  18  Temp: (!) 97.5 F (36.4 C) 98.2 F (36.8 C) 98.4 F (36.9 C) 100.1 F (37.8 C)  TempSrc: Oral  Oral Oral  SpO2: (!) 78% 99% 98% 96%  Weight:      Height:        Intake/Output Summary (Last 24 hours) at 09/23/2024 1712 Last data filed at 09/23/2024 9660 Gross per 24 hour  Intake 681.18 ml  Output --  Net 681.18 ml   Filed Weights   09/22/24 1725 09/22/24 2134  Weight: 81.1 kg 77 kg    Examination:  Physical Exam Constitutional:      General: He is not in acute distress. Cardiovascular:     Rate and Rhythm: Normal rate and regular rhythm.  Pulmonary:     Effort: Pulmonary effort is normal.     Breath sounds: Normal breath sounds.  Abdominal:     Palpations: Abdomen is soft.  Musculoskeletal:     Right lower leg: No edema.     Left lower leg: No edema.  Skin:    General: Skin is warm and dry.  Neurological:     Mental Status: He is alert. He is disoriented.          Scheduled Medications:   ARIPiprazole   2 mg Oral QHS    artificial tears  1 drop Both Eyes QHS   aspirin   81 mg Oral Daily   atorvastatin   40 mg Oral QPM   clopidogrel   75 mg Oral Daily   guaiFENesin   600 mg Oral BID   labetalol   100 mg Oral TID   [START ON 09/24/2024] levETIRAcetam   500 mg Oral Q24H   oxybutynin   5 mg Oral QHS   pantoprazole   40 mg Oral BID   senna-docusate  2 tablet Oral QHS   [START ON 09/24/2024] sertraline   175 mg Oral Daily   vancomycin  variable dose per unstable renal function (pharmacist dosing)   Does not apply See admin instructions   [START ON 09/29/2024] Vitamin D (Ergocalciferol)  50,000 Units Oral Q7 days    Continuous Infusions:  cefTRIAXone  (ROCEPHIN )  IV      PRN  Medications:  acetaminophen  **OR** acetaminophen , camphor-menthol , ipratropium-albuterol , lactulose , ondansetron  **OR** ondansetron  (ZOFRAN ) IV  Antimicrobials from admission:  Anti-infectives (From admission, onward)    Start     Dose/Rate Route Frequency Ordered Stop   09/23/24 1800  cefTRIAXone  (ROCEPHIN ) 2 g in sodium chloride  0.9 % 100 mL IVPB        2 g 200 mL/hr over 30 Minutes Intravenous Every 24 hours 09/23/24 1446     09/23/24 1448  vancomycin  variable dose per unstable renal function (pharmacist dosing)         Does not apply See admin instructions 09/23/24 1448     09/22/24 1915  vancomycin  (VANCOREADY) IVPB 1750 mg/350 mL        1,750 mg 175 mL/hr over 120 Minutes Intravenous  Once 09/22/24 1849 09/23/24 0113   09/22/24 1845  ceFEPIme  (MAXIPIME ) 2 g in sodium chloride  0.9 % 100 mL IVPB        2 g 200 mL/hr over 30 Minutes Intravenous  Once 09/22/24 1840 09/22/24 1923   09/22/24 1845  vancomycin  (VANCOCIN ) IVPB 1000 mg/200 mL premix  Status:  Discontinued        1,000 mg 200 mL/hr over 60 Minutes Intravenous  Once 09/22/24 1840 09/22/24 1848           Data Reviewed:  I have personally reviewed the following...  CBC: Recent Labs  Lab 09/22/24 1758 09/23/24 0628  WBC 11.4* 10.0  NEUTROABS 9.1*  --   HGB 11.1*  11.2*  HCT 34.9* 36.1*  MCV 94.8 95.8  PLT 194 169   Basic Metabolic Panel: Recent Labs  Lab 09/22/24 1758 09/23/24 0628  NA 141 142  K 5.2* 4.8  CL 104 103  CO2 17* 17*  GLUCOSE 124* 110*  BUN 93* 98*  CREATININE 5.40* 5.33*  CALCIUM  9.6 9.4   GFR: Estimated Creatinine Clearance: 12.7 mL/min (A) (by C-G formula based on SCr of 5.33 mg/dL (H)). Liver Function Tests: Recent Labs  Lab 09/22/24 1758  AST 13*  ALT 11  ALKPHOS 70  BILITOT 0.3  PROT 7.1  ALBUMIN 4.1   No results for input(s): LIPASE, AMYLASE in the last 168 hours. No results for input(s): AMMONIA in the last 168 hours. Coagulation Profile: No results for input(s): INR, PROTIME in the last 168 hours. Cardiac Enzymes: No results for input(s): CKTOTAL, CKMB, CKMBINDEX, TROPONINI in the last 168 hours. BNP (last 3 results) Recent Labs    09/22/24 1758  PROBNP 789.0*   HbA1C: Recent Labs    09/22/24 1758  HGBA1C 5.6   CBG: Recent Labs  Lab 09/22/24 2258 09/23/24 0829 09/23/24 1124 09/23/24 1621  GLUCAP 130* 94 75 119*   Lipid Profile: No results for input(s): CHOL, HDL, LDLCALC, TRIG, CHOLHDL, LDLDIRECT in the last 72 hours. Thyroid Function Tests: No results for input(s): TSH, T4TOTAL, FREET4, T3FREE, THYROIDAB in the last 72 hours. Anemia Panel: No results for input(s): VITAMINB12, FOLATE, FERRITIN, TIBC, IRON, RETICCTPCT in the last 72 hours. Most Recent Urinalysis On File:     Component Value Date/Time   COLORURINE AMBER (A) 09/22/2024 1758   APPEARANCEUR CLOUDY (A) 09/22/2024 1758   APPEARANCEUR Hazy (A) 09/29/2022 1134   LABSPEC 1.018 09/22/2024 1758   PHURINE 7.0 09/22/2024 1758   GLUCOSEU NEGATIVE 09/22/2024 1758   HGBUR LARGE (A) 09/22/2024 1758   BILIRUBINUR NEGATIVE 09/22/2024 1758   BILIRUBINUR Negative 09/29/2022 1134   KETONESUR NEGATIVE 09/22/2024 1758   PROTEINUR 100 (A) 09/22/2024 1758   NITRITE NEGATIVE 09/22/2024  1758   LEUKOCYTESUR MODERATE (A) 09/22/2024 1758   Sepsis Labs: @LABRCNTIP (procalcitonin:4,lacticidven:4) Microbiology: Recent Results (from the past 240 hours)  Culture, blood (routine x 2)     Status: None (Preliminary result)   Collection Time: 09/22/24  5:37 PM   Specimen: BLOOD LEFT HAND  Result Value Ref Range Status   Specimen Description BLOOD LEFT HAND  Final   Special Requests   Final    BOTTLES DRAWN AEROBIC AND ANAEROBIC Blood Culture results may not be optimal due to an inadequate volume of blood received in culture bottles   Culture   Final    NO GROWTH < 12 HOURS Performed at [facility], [address] [place], [place] [address]    Report Status PENDING  Incomplete  Culture, blood (routine x 2)     Status: None (Preliminary result)   Collection Time: 09/22/24  5:57 PM   Specimen: Right Antecubital; Blood  Result Value Ref Range Status   Specimen Description RIGHT ANTECUBITAL  Final   Special Requests   Final    BOTTLES DRAWN AEROBIC AND ANAEROBIC Blood Culture results may not be optimal due to an inadequate volume of blood received in culture bottles   Culture  Setup Time   Final    GRAM POSITIVE COCCI ANAEROBIC BOTTLE ONLY Organism ID to follow CRITICAL RESULT CALLED TO, READ BACK BY AND VERIFIED WITH: EMILY STEINBOCK 09/23/24 1424 KLW Performed at [facility], [address] [place], [place] [address]    Culture GRAM POSITIVE COCCI  Final   Report Status PENDING  Incomplete  Blood Culture ID Panel (Reflexed)     Status: Abnormal   Collection Time: 09/22/24  5:57 PM  Result Value Ref Range Status   Enterococcus faecalis NOT DETECTED NOT DETECTED Final   Enterococcus Faecium NOT DETECTED NOT DETECTED Final   Listeria monocytogenes NOT DETECTED NOT DETECTED Final   Staphylococcus species DETECTED (A) NOT DETECTED Final    Comment: CRITICAL RESULT CALLED TO, READ BACK BY AND VERIFIED WITH: EMILY STEINBOCK 09/23/24 1424 KLW     Staphylococcus aureus (BCID) NOT DETECTED NOT DETECTED Final   Staphylococcus epidermidis DETECTED (A) NOT DETECTED Final    Comment: Methicillin (oxacillin) resistant coagulase negative staphylococcus. Possible blood culture contaminant (unless isolated from more than one blood culture draw or clinical case suggests pathogenicity). No antibiotic treatment is indicated for blood  culture contaminants. CRITICAL RESULT CALLED TO, READ BACK BY AND VERIFIED WITH: EMILY STEINBOCK 09/23/24 1424 KLW    Staphylococcus lugdunensis NOT DETECTED NOT DETECTED Final   Streptococcus species NOT DETECTED NOT DETECTED Final   Streptococcus agalactiae NOT DETECTED NOT DETECTED Final   Streptococcus pneumoniae NOT DETECTED NOT DETECTED Final   Streptococcus pyogenes NOT DETECTED NOT DETECTED Final   A.calcoaceticus-baumannii NOT DETECTED NOT DETECTED Final   Bacteroides fragilis NOT DETECTED NOT DETECTED Final   Enterobacterales NOT DETECTED NOT DETECTED Final   Enterobacter cloacae complex NOT DETECTED NOT DETECTED Final   Escherichia coli NOT DETECTED NOT DETECTED Final   Klebsiella aerogenes NOT DETECTED NOT DETECTED Final   Klebsiella oxytoca NOT DETECTED NOT DETECTED Final   Klebsiella pneumoniae NOT DETECTED NOT DETECTED Final   Proteus species NOT DETECTED NOT DETECTED Final   Salmonella species NOT DETECTED NOT DETECTED Final   Serratia marcescens NOT DETECTED NOT DETECTED Final   Haemophilus influenzae NOT DETECTED NOT DETECTED Final   Neisseria meningitidis NOT DETECTED NOT DETECTED Final   Pseudomonas aeruginosa NOT DETECTED NOT DETECTED Final   Stenotrophomonas  maltophilia NOT DETECTED NOT DETECTED Final   Candida albicans NOT DETECTED NOT DETECTED Final   Candida auris NOT DETECTED NOT DETECTED Final   Candida glabrata NOT DETECTED NOT DETECTED Final   Candida krusei NOT DETECTED NOT DETECTED Final   Candida parapsilosis NOT DETECTED NOT DETECTED Final   Candida tropicalis NOT DETECTED NOT  DETECTED Final   Cryptococcus neoformans/gattii NOT DETECTED NOT DETECTED Final   Methicillin resistance mecA/C DETECTED (A) NOT DETECTED Final    Comment: CRITICAL RESULT CALLED TO, READ BACK BY AND VERIFIED WITH: EMILY STEINBOCK 09/23/24 1424 KLW Performed at [facility], [address] [place], [place] [address]   Resp panel by RT-PCR (RSV, Flu A&B, Covid) Anterior Nasal Swab     Status: None   Collection Time: 09/22/24  5:58 PM   Specimen: Anterior Nasal Swab  Result Value Ref Range Status   SARS Coronavirus 2 by RT PCR NEGATIVE NEGATIVE Final    Comment: (NOTE) SARS-CoV-2 target nucleic acids are NOT DETECTED.  The SARS-CoV-2 RNA is generally detectable in upper respiratory specimens during the acute phase of infection. The lowest concentration of SARS-CoV-2 viral copies this assay can detect is 138 copies/mL. A negative result does not preclude SARS-Cov-2 infection and should not be used as the sole basis for treatment or other patient management decisions. A negative result may occur with  improper specimen collection/handling, submission of specimen other than nasopharyngeal swab, presence of viral mutation(s) within the areas targeted by this assay, and inadequate number of viral copies(<138 copies/mL). A negative result must be combined with clinical observations, patient history, and epidemiological information. The expected result is Negative.  Fact Sheet for Patients:  bloggercourse.com  Fact Sheet for Healthcare Providers:  seriousbroker.it  This test is no t yet approved or cleared by the [place]  FDA and  has been authorized for detection and/or diagnosis of SARS-CoV-2 by FDA under an Emergency Use Authorization (EUA). This EUA will remain  in effect (meaning this test can be used) for the duration of the COVID-19 declaration under Section 564(b)(1) of the Act, 21 U.S.C.section 360bbb-3(b)(1),  unless the authorization is terminated  or revoked sooner.       Influenza A by PCR NEGATIVE NEGATIVE Final   Influenza B by PCR NEGATIVE NEGATIVE Final    Comment: (NOTE) The Xpert Xpress SARS-CoV-2/FLU/RSV plus assay is intended as an aid in the diagnosis of influenza from Nasopharyngeal swab specimens and should not be used as a sole basis for treatment. Nasal washings and aspirates are unacceptable for Xpert Xpress SARS-CoV-2/FLU/RSV testing.  Fact Sheet for Patients: bloggercourse.com  Fact Sheet for Healthcare Providers: seriousbroker.it  This test is not yet approved or cleared by the [place]  FDA and has been authorized for detection and/or diagnosis of SARS-CoV-2 by FDA under an Emergency Use Authorization (EUA). This EUA will remain in effect (meaning this test can be used) for the duration of the COVID-19 declaration under Section 564(b)(1) of the Act, 21 U.S.C. section 360bbb-3(b)(1), unless the authorization is terminated or revoked.     Resp Syncytial Virus by PCR NEGATIVE NEGATIVE Final    Comment: (NOTE) Fact Sheet for Patients: bloggercourse.com  Fact Sheet for Healthcare Providers: seriousbroker.it  This test is not yet approved or cleared by the [place]  FDA and has been authorized for detection and/or diagnosis of SARS-CoV-2 by FDA under an Emergency Use Authorization (EUA). This EUA will remain in effect (meaning this test can be used) for the duration of the COVID-19 declaration  under Section 564(b)(1) of the Act, 21 U.S.C. section 360bbb-3(b)(1), unless the authorization is terminated or revoked.  Performed at [facility], [address], [place], [place] [address]       Radiology Studies last 3 days: DG Abd 1 View Result Date: 09/23/2024 CLINICAL DATA:  Urinary tract infection.  Altered mental status. EXAM: ABDOMEN - 1  VIEW COMPARISON:  Radiograph 08/01/2022 FINDINGS: Possible right femoral catheter with tip terminating in the region of the right iliac vessels, alternatively this may be external to the patient. Slight gaseous distention of colon and to a lesser extent small bowel in the left abdomen. Vascular calcifications as well as calcifications projecting over the left renal shadow, renal stones versus renal vascular calcifications. No evidence of free air on supine views. IMPRESSION: 1. Possible right femoral catheter with tip terminating in the region of the right iliac vessels, alternatively this may be external to the patient. 2. Slight gaseous distention of colon and to a lesser extent small bowel in the left abdomen, may represent ileus. 3. Possible left-sided urolithiasis. Electronically Signed   By: Andrea Gasman M.D.   On: 09/23/2024 16:02   CT Head Wo Contrast Result Date: 09/22/2024 EXAM: CT HEAD WITHOUT CONTRAST 09/22/2024 06:47:02 PM TECHNIQUE: CT of the head was performed without the administration of intravenous contrast. Automated exposure control, iterative reconstruction, and/or weight based adjustment of the mA/kV was utilized to reduce the radiation dose to as low as reasonably achievable. COMPARISON: None available. CLINICAL HISTORY: Mental status change, unknown cause FINDINGS: BRAIN AND VENTRICLES: No acute hemorrhage. Large remote right MCA territory infarct. Vascular stent within the right MCA M2 segment. Mild periventricular white matter changes likely related to small vessel ischemia. Mild superimposed global parenchymal atrophy. Ex vacuo dilatation of right lateral ventricle. No extra-axial collection. No mass effect or midline shift. Calcific atherosclerosis. ORBITS: No acute abnormality. SINUSES: Right maxillary sinus mucosal thickening. SOFT TISSUES AND SKULL: No acute soft tissue abnormality. No skull fracture. IMPRESSION: 1. Large remote right MCA territory infarct with ex vacuo  dilatation of the right lateral ventricle and a vascular stent in the right MCA M2 segment. 2. Mild chronic microvascular ischemic white matter changes. 3. Mild global cerebral parenchymal atrophy. 4. Calcific atherosclerosis. 5. Right maxillary sinus mucosal thickening. Electronically signed by: Dorethia Molt MD 09/22/2024 08:07 PM EST RP Workstation: [id number]   DG Chest Port 1 View Result Date: 09/22/2024 EXAM: 1 VIEW(S) XRAY OF THE CHEST 09/22/2024 05:45:00 PM COMPARISON: None available. CLINICAL HISTORY: Hypoxia FINDINGS: LUNGS AND PLEURA: Low lung volumes. No focal pulmonary opacity. No pleural effusion. No pneumothorax. HEART AND MEDIASTINUM: Aortic calcification. No acute abnormality of the cardiac and mediastinal silhouettes. BONES AND SOFT TISSUES: No acute osseous abnormality. UPPER ABDOMEN: Gastric gaseous distention. IMPRESSION: 1. No acute cardiopulmonary abnormality. 2. Low lung volumes. 3. Aortic calcification. Electronically signed by: Dorethia Molt MD 09/22/2024 08:05 PM EST RP Workstation: [id number]         Laneta Blunt, DO [facility] 09/23/2024, 5:12 PM    Dictation software may have been used to generate the above note. Typos may occur and escape review in typed/dictated notes. Please contact Dr Blunt directly for clarity if needed.  Staff may message me via secure chat in Epic  but this may not receive an immediate response,  please page me for urgent matters!  If 7PM-7AM, please contact night coverage [email or web address]       "

## 2024-09-23 NOTE — [place] FL2 (Signed)
 " [place]  MEDICAID FL2 LEVEL OF CARE FORM     IDENTIFICATION  Patient Name: Rodney Estrada Birthdate: 06-25-1951 Sex: male Admission Date (Current Location): 09/22/2024  [place] and [place] Number:  Chiropodist and Address:  [facility], [address] [place], [place] [address]      Provider Number: [id number]  Attending Physician Name and Address:  Marsa Edelman, DO  Relative Name and Phone Number:  Rhoda Ing- sister- [phone]    Current Level of Care: Hospital Recommended Level of Care: Skilled Nursing Facility Prior Approval Number:    Date Approved/Denied:   PASRR Number:    Discharge Plan: SNF    Current Diagnoses: Patient Active Problem List   Diagnosis Date Noted   Elevated troponin 09/23/2024   Acute metabolic encephalopathy 09/22/2024   Acute lower UTI 09/22/2024   Acute kidney injury superimposed on chronic kidney disease 09/22/2024   Type 2 diabetes mellitus with peripheral neuropathy (HCC) 09/22/2024   Seizure disorder (HCC) 09/22/2024   GERD without esophagitis 09/22/2024   Depression 09/22/2024   Gout 09/22/2024   Dyslipidemia 09/22/2024   Hyperkalemia 09/22/2024   Coronary artery disease 09/22/2024   Hypernatremia 07/31/2022   Hiccups 07/28/2022   Hypokalemia 07/27/2022   SBO (small bowel obstruction) (HCC) 07/23/2022   Nephrolithiasis 07/23/2022   Partial intestinal obstruction (HCC)    Cerebral edema (HCC) 04/16/2020   Hypertensive emergency 04/16/2020   Essential hypertension 04/16/2020   Hyperlipidemia LDL goal <70 04/16/2020   Type 2 diabetes mellitus (HCC) 04/16/2020   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 04/16/2020   AKI (acute kidney injury) (HCC) in the setting of stage IIIa chronic kidney disease 04/16/2020   Tobacco use disorder 04/16/2020   Alcohol  abuse 04/16/2020   Cocaine abuse (HCC) 04/16/2020   Obesity 04/16/2020   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 04/05/2020    Orientation RESPIRATION BLADDER Height & Weight     Self  O2 (chronic) Incontinent Weight: 77 kg Height:  5' 9 (175.3 cm)  BEHAVIORAL SYMPTOMS/MOOD NEUROLOGICAL BOWEL NUTRITION STATUS      Incontinent Diet (Carb modified)  AMBULATORY STATUS COMMUNICATION OF NEEDS Skin   Total Care Verbally Bruising                       Personal Care Assistance Level of Assistance  Total care       Total Care Assistance: Maximum assistance   Functional Limitations Info             SPECIAL CARE FACTORS FREQUENCY                       Contractures Contractures Info: Present (lef hand, both legs)    Additional Factors Info  Code Status, Allergies Code Status Info: DNR Allergies Info: NKA           Current Medications (09/23/2024):  This is the current hospital active medication list Current Facility-Administered Medications  Medication Dose Route Frequency Provider Last Rate Last Admin   acetaminophen  (TYLENOL ) tablet 650 mg  650 mg Oral Q6H PRN Mansy, Jan A, MD       Or   acetaminophen  (TYLENOL ) suppository 650 mg  650 mg Rectal Q6H PRN Mansy, Jan A, MD       ARIPiprazole  (ABILIFY ) tablet 2 mg  2 mg Oral QHS Mansy, Jan A, MD   2 mg at 09/22/24 2307   artificial tears  ophthalmic solution 1 drop  1 drop Both Eyes QHS Mansy, Jan A, MD   1 drop at 09/23/24 0054   aspirin  chewable tablet 81 mg  81 mg Oral Daily Mansy, Jan A, MD   81 mg at 09/23/24 9072   atorvastatin  (LIPITOR ) tablet 40 mg  40 mg Oral QPM Mansy, Jan A, MD   40 mg at 09/22/24 2246   camphor-menthol  (SARNA) lotion 1 Application  1 Application Topical PRN Mansy, Jan A, MD       clopidogrel  (PLAVIX ) tablet 75 mg  75 mg Oral Daily Mansy, Jan A, MD   75 mg at 09/23/24 9072   guaiFENesin  (MUCINEX ) 12 hr tablet 600 mg  600 mg Oral BID Mansy, Jan A, MD   600 mg at 09/23/24 9072   ipratropium-albuterol  (DUONEB) 0.5-2.5 (3) MG/3ML nebulizer solution 3 mL  3 mL Inhalation  Q4H PRN Mansy, Jan A, MD       labetalol  (NORMODYNE ) tablet 100 mg  100 mg Oral TID Mansy, Jan A, MD   100 mg at 09/22/24 2307   lactulose  (CHRONULAC ) 10 GM/15ML solution 20 g  20 g Oral BID PRN Alexander, Natalie, DO       levETIRAcetam  (KEPPRA ) tablet 500 mg  500 mg Oral BID Mansy, Jan A, MD   500 mg at 09/23/24 9072   ondansetron  (ZOFRAN ) tablet 4 mg  4 mg Oral Q6H PRN Mansy, Jan A, MD       Or   ondansetron  (ZOFRAN ) injection 4 mg  4 mg Intravenous Q6H PRN Mansy, Jan A, MD       oxybutynin  (DITROPAN -XL) 24 hr tablet 5 mg  5 mg Oral QHS Mansy, Jan A, MD   5 mg at 09/22/24 2307   pantoprazole  (PROTONIX ) EC tablet 40 mg  40 mg Oral BID Mansy, Jan A, MD   40 mg at 09/23/24 9072   senna-docusate (Senokot-S) tablet 2 tablet  2 tablet Oral QHS Mansy, Jan A, MD   2 tablet at 09/22/24 2246   sertraline  (ZOLOFT ) tablet 100 mg  100 mg Oral Daily Mansy, Jan A, MD   100 mg at 09/23/24 9072   [START ON 09/29/2024] Vitamin D (Ergocalciferol) (DRISDOL) 1.25 MG (50000 UNIT) capsule 50,000 Units  50,000 Units Oral Q7 days Mansy, Madison LABOR, MD         Discharge Medications: Please see discharge summary for a list of discharge medications.  Relevant Imaging Results:  Relevant Lab Results:   Additional Information SSN: [id number]  Nathanael CHRISTELLA Ring, RN     "

## 2024-09-24 ENCOUNTER — Inpatient Hospital Stay

## 2024-09-24 DIAGNOSIS — G9341 Metabolic encephalopathy: Secondary | ICD-10-CM | POA: Diagnosis not present

## 2024-09-24 LAB — CBC
HCT: 31.7 % — ABNORMAL LOW (ref 39.0–52.0)
Hemoglobin: 9.9 g/dL — ABNORMAL LOW (ref 13.0–17.0)
MCH: 29.8 pg (ref 26.0–34.0)
MCHC: 31.2 g/dL (ref 30.0–36.0)
MCV: 95.5 fL (ref 80.0–100.0)
Platelets: 163 K/uL (ref 150–400)
RBC: 3.32 MIL/uL — ABNORMAL LOW (ref 4.22–5.81)
RDW: 13.2 % (ref 11.5–15.5)
WBC: 18.4 K/uL — ABNORMAL HIGH (ref 4.0–10.5)
nRBC: 0 % (ref 0.0–0.2)

## 2024-09-24 LAB — CORTISOL: Cortisol, Plasma: 16.1 ug/dL

## 2024-09-24 LAB — BASIC METABOLIC PANEL WITH GFR
Anion gap: 25 — ABNORMAL HIGH (ref 5–15)
BUN: 115 mg/dL — ABNORMAL HIGH (ref 8–23)
CO2: 13 mmol/L — ABNORMAL LOW (ref 22–32)
Calcium: 8.4 mg/dL — ABNORMAL LOW (ref 8.9–10.3)
Chloride: 101 mmol/L (ref 98–111)
Creatinine, Ser: 6.31 mg/dL — ABNORMAL HIGH (ref 0.61–1.24)
GFR, Estimated: 9 mL/min — ABNORMAL LOW
Glucose, Bld: 126 mg/dL — ABNORMAL HIGH (ref 70–99)
Potassium: 5.1 mmol/L (ref 3.5–5.1)
Sodium: 140 mmol/L (ref 135–145)

## 2024-09-24 LAB — LEVETIRACETAM LEVEL: Levetiracetam Lvl: 67 ug/mL — ABNORMAL HIGH (ref 10.0–40.0)

## 2024-09-24 LAB — IRON AND TIBC
Iron: 21 ug/dL — ABNORMAL LOW (ref 45–182)
Saturation Ratios: 8 % — ABNORMAL LOW (ref 17.9–39.5)
TIBC: 253 ug/dL (ref 250–450)
UIBC: 232 ug/dL

## 2024-09-24 LAB — GLUCOSE, CAPILLARY
Glucose-Capillary: 102 mg/dL — ABNORMAL HIGH (ref 70–99)
Glucose-Capillary: 101 mg/dL — ABNORMAL HIGH (ref 70–99)
Glucose-Capillary: 104 mg/dL — ABNORMAL HIGH (ref 70–99)
Glucose-Capillary: 79 mg/dL (ref 70–99)

## 2024-09-24 LAB — LACTIC ACID, PLASMA
Lactic Acid, Venous: 0.8 mmol/L (ref 0.5–1.9)
Lactic Acid, Venous: 0.9 mmol/L (ref 0.5–1.9)

## 2024-09-24 LAB — MAGNESIUM: Magnesium: 2.6 mg/dL — ABNORMAL HIGH (ref 1.7–2.4)

## 2024-09-24 LAB — VITAMIN D 25 HYDROXY (VIT D DEFICIENCY, FRACTURES): Vit D, 25-Hydroxy: 37 ng/mL (ref 30–100)

## 2024-09-24 LAB — FOLATE: Folate: 4.1 ng/mL — ABNORMAL LOW

## 2024-09-24 LAB — PROCALCITONIN: Procalcitonin: 100 ng/mL

## 2024-09-24 LAB — PHOSPHORUS: Phosphorus: 8.3 mg/dL — ABNORMAL HIGH (ref 2.5–4.6)

## 2024-09-24 LAB — VANCOMYCIN, RANDOM: Vancomycin Rm: 20 ug/mL

## 2024-09-24 LAB — VITAMIN B12: Vitamin B-12: 218 pg/mL (ref 180–914)

## 2024-09-24 MED ORDER — HEPARIN SODIUM (PORCINE) 5000 UNIT/ML IJ SOLN
5000.0000 [IU] | Freq: Three times a day (TID) | INTRAMUSCULAR | Status: DC
  Administered 2024-09-24 – 2024-10-02 (×24): 5000 [IU] via SUBCUTANEOUS
  Filled 2024-09-24 (×24): qty 1

## 2024-09-24 MED ORDER — LACTATED RINGERS IV BOLUS
500.0000 mL | Freq: Once | INTRAVENOUS | Status: AC
  Administered 2024-09-24: 500 mL via INTRAVENOUS

## 2024-09-24 MED ORDER — LEVETIRACETAM (KEPPRA) 500 MG/5 ML ADULT IV PUSH
500.0000 mg | Freq: Two times a day (BID) | INTRAVENOUS | Status: DC
  Filled 2024-09-24: qty 5

## 2024-09-24 MED ORDER — NOREPINEPHRINE 4 MG/250ML-% IV SOLN
0.0000 ug/min | INTRAVENOUS | Status: DC
  Filled 2024-09-24: qty 250

## 2024-09-24 MED ORDER — CHLORHEXIDINE GLUCONATE CLOTH 2 % EX PADS
6.0000 | MEDICATED_PAD | Freq: Every day | CUTANEOUS | Status: DC
  Administered 2024-09-24 – 2024-09-25 (×2): 6 via TOPICAL

## 2024-09-24 MED ORDER — SODIUM CHLORIDE 0.9 % IV BOLUS
500.0000 mL | Freq: Once | INTRAVENOUS | Status: AC
  Administered 2024-09-24: 500 mL via INTRAVENOUS

## 2024-09-24 MED ORDER — LEVETIRACETAM 500 MG PO TABS: 500.0000 mg | ORAL_TABLET | Freq: Two times a day (BID) | ORAL | Status: DC

## 2024-09-24 MED ORDER — VANCOMYCIN HCL 1750 MG/350ML IV SOLN
1750.0000 mg | Freq: Once | INTRAVENOUS | Status: AC
  Administered 2024-09-24: 1750 mg via INTRAVENOUS
  Filled 2024-09-24: qty 350

## 2024-09-24 MED ORDER — SODIUM CHLORIDE 0.45 % IV SOLN
INTRAVENOUS | Status: DC
  Filled 2024-09-24 (×12): qty 75

## 2024-09-24 MED ORDER — NOREPINEPHRINE 4 MG/250ML-% IV SOLN: 0.0000 ug/min | INTRAVENOUS | Status: DC

## 2024-09-24 MED ORDER — SODIUM CHLORIDE 0.9 % IV SOLN: 250.0000 mL | INTRAVENOUS | Status: AC

## 2024-09-24 MED ORDER — LEVETIRACETAM 500 MG PO TABS
500.0000 mg | ORAL_TABLET | ORAL | Status: DC
  Administered 2024-09-24: 500 mg via ORAL
  Filled 2024-09-24 (×2): qty 1

## 2024-09-24 MED ORDER — SODIUM BICARBONATE 8.4 % IV SOLN
100.0000 meq | Freq: Once | INTRAVENOUS | Status: AC
  Administered 2024-09-24: 100 meq via INTRAVENOUS
  Filled 2024-09-24: qty 100

## 2024-09-24 MED ORDER — LEVETIRACETAM (KEPPRA) 500 MG/5 ML ADULT IV PUSH
500.0000 mg | INTRAVENOUS | Status: DC
  Administered 2024-09-25 – 2024-09-26 (×2): 500 mg via INTRAVENOUS
  Filled 2024-09-24 (×4): qty 5

## 2024-09-24 NOTE — Progress Notes (Signed)
 [facility] Progress Note  Patient: Rodney Estrada    FMW:[id number]  DOA: 09/22/2024     Date of Service: the patient was seen and examined on 09/24/2024  No chief complaint on file.  Brief hospital course:  from H&P - Rodney Estrada is a 73 y.o. male with medical history significant for coronary artery disease, chronic systolic CHF, gout, hypertension, dyslipidemia, atrial fibrillation and CVA as well as type 2 diabetes mellitus who presented to the emergency room 12/29 with acute onset of altered mental status with confusion and decreased responsiveness since Friday 12/26 at his SNF.  He has been nonverbal with increased tremors.  No reported fever or chills.  SNF staff noted congested cough and therefore the patient was given IM Rocephin  this afternoon.  The patient was nonverbal during admitting hospitalist's interview and would answer some questions nodding his head.  No chest pain or palpitations were reported.  No reported nausea or vomiting or diarrhea or abdominal pain.     12/29: to ED. (+)UTI, started on IV fluids, vanc, cefepime . Admitted to hospitalist for acute encephalopathy d/t UTI, AKI on CKD 3a assoc w/ hyperkalemia. Concern w/ elevation troponins, heparin  gtt started.  12/30: holding heparin  gtt given hematuria. Cardiology following, in agreement. Echo pending. Pt remains confused. Echo pending.          Consultants:  Cardiology signed off on 12/31 Nephrology   Procedures/Surgeries: None   Assessment and Plan:  Septic shock Secondary to UTI Continue ceftriaxone  and vancomycin  Pharmacy consulted for dosing 12/31 NS 500 mL bolus PCCM consulted, LR 500 bolus ordered, if no improvement then patient will need Levophed. Transferred to stepdown unit Follow lactic acid level Check cortisol level    Acute metabolic encephalopathy secondary to urine MRI brain pending radiology read - appears stable from previous on personal review  Continue neurochecks  keppra   levels elevated, pharmacy consulted for dose and monitor Hold gabapentin  (300 mg tid home dose)   # AKI on CKD stage III # Metabolic encephalopathy secondary to uremia # Metabolic acidosis Bicarbonate 100 mEq IV one-time push given Started bicarb IV infusion Nephrology consulted for urgent hemodialysis Perative care consulted, as per nephro patient may not be able to get hemodialysis done as an outpatient due to comorbidities.   Acute lower UTI IV Rocephin   Pending cultures    Staph epidermidis blood culture positive Concern for MRSA 1 out of 4 blood cultures positive, question contamination Covering with vancomycin  given severity of illness Pharmacy consulted for dosing and trough monitor   Elevated troponin / Type II  likely demand ischemia  S/p IV heparin  dc TTE  Cardiology consulted recommended no intervention.  Signed off on 12/31     Gross hematuria Question due to UTI Heparin  DC Monitor CBC Consider CT abdomen/pelvis renal study if not resolving. If drop H/H or persistent/worsening blood, contact urology   Hyperkalemia D/t AKI Treat underlying cause(s) as noted  S/p lokelma , repeat as needed Follow BMP   Essential hypertension but low BP here  Hold amlodipine , lisinopril     Coronary artery disease HOD ASA, Plavix , statin Hold antiypertensives    Gout Hold allopurinol  w/ AKI   Depression Continue Zoloft  home dose 175 mg daily --> 150  Abilify     GERD without esophagitis PPI   Seizure disorder continue Keppra  Check keppra  levels    Constipation Bowel regimen per orders   Type 2 diabetes mellitus with peripheral neuropathy  Not on home antihyperglycemics No glucosuria, serum Glc <150 Holding SSI Monitor  Glc achs for now and initiate insulin  if needed  A1C hold Neurontin  w/ AKI and encephalopathy   Prednisone rx outpatient started 12/26 20 mg daily NOT chronic medication Uncertain why but suspect as above he was treated for presumed  respiratory illness at faciltiy - will dc this  Check cortisol level, patient may need high-dose steroids    Overall condition prognosis is very poor, patient may not survive. Critical care consulted, we will continue above management. Palliative care consulted for goals of care discussion.   Body mass index is 25.07 kg/m.  Interventions:  Diet: NPO DVT Prophylaxis: Subcutaneous Heparin     Advance goals of care discussion: DNR-limited  Family Communication: family was not present at bedside, at the time of interview.  The pt provided permission to discuss medical plan with the family. Opportunity was given to ask question and all questions were answered satisfactorily.  12/31 called and left a VM for his sister Rodney Estrada [phone]    Disposition:  Pt is from Home, admitted with sepsis, AMS, AKI, uremia, still critically ill, which precludes a safe discharge. Discharge to SNF TBD after PT and OT eval, when stable, may need few days to improve.  Subjective: No significant events overnight. Since morning patient remained lethargic, unable to open his eyes, only answering yes and yes, unable to communicate and unable to offer any complaints.  Unable to follow commands.  Physical Exam: General: NAD, lying comfortably Eyes: clossed ENT: Oral Mucosa Clear, moist  Neck: no JVD,  Cardiovascular: S1 and S2 Present, no Murmur,  Respiratory: good respiratory effort, Bilateral Air entry equal and Decreased, no Crackles, no wheezes Abdomen: Bowel Sound present, Soft and no tenderness,  Skin: no rashes Extremities: no Pedal edema, no calf tenderness Neurologic: Left-sided residual weakness due to prior CVA.  Patient is obtunded, unable to follow command   Vitals:   09/24/24 0808 09/24/24 1018 09/24/24 1400 09/24/24 1623  BP: (!) 79/46 (!) 82/48 (!) 87/56 (!) 86/51  Pulse: 66 70 66 67  Resp: 14 20  (!) 22  Temp: 98.2 F (36.8 C) 98 F (36.7 C) 98 F (36.7 C) 98 F  (36.7 C)  TempSrc:  Oral Axillary   SpO2: 96% 98% 99% 96%  Weight:      Height:        Intake/Output Summary (Last 24 hours) at 09/24/2024 1628 Last data filed at 09/24/2024 1500 Gross per 24 hour  Intake 750 ml  Output 1125 ml  Net -375 ml   Filed Weights   09/22/24 1725 09/22/24 2134  Weight: 81.1 kg 77 kg    Data Reviewed: I have personally reviewed and interpreted daily labs, tele strips, imagings as discussed above. I reviewed all nursing notes, pharmacy notes, vitals, pertinent old records I have discussed plan of care as described above with RN and patient/family.  CBC: Recent Labs  Lab 09/22/24 1758 09/23/24 0628 09/24/24 0445  WBC 11.4* 10.0 18.4*  NEUTROABS 9.1*  --   --   HGB 11.1* 11.2* 9.9*  HCT 34.9* 36.1* 31.7*  MCV 94.8 95.8 95.5  PLT 194 169 163   Basic Metabolic Panel: Recent Labs  Lab 09/22/24 1758 09/23/24 0628 09/24/24 0445 09/24/24 0955  NA 141 142 140  --   K 5.2* 4.8 5.1  --   CL 104 103 101  --   CO2 17* 17* 13*  --   GLUCOSE 124* 110* 126*  --   BUN 93* 98* 115*  --   CREATININE  5.40* 5.33* 6.31*  --   CALCIUM  9.6 9.4 8.4*  --   MG  --   --   --  2.6*  PHOS  --   --   --  8.3*    Studies: No results found.  Scheduled Meds:  ARIPiprazole   2 mg Oral QHS   artificial tears  1 drop Both Eyes QHS   aspirin   81 mg Oral Daily   atorvastatin   40 mg Oral QPM   clopidogrel   75 mg Oral Daily   guaiFENesin   600 mg Oral BID   levETIRAcetam   500 mg Oral Q24H   Or   levETIRAcetam   500 mg Intravenous Q24H   oxybutynin   5 mg Oral QHS   pantoprazole   40 mg Oral BID   senna-docusate  2 tablet Oral QHS   sertraline   175 mg Oral Daily   vancomycin  variable dose per unstable renal function (pharmacist dosing)   Does not apply See admin instructions   [START ON 09/29/2024] Vitamin D (Ergocalciferol)  50,000 Units Oral Q7 days   Continuous Infusions:  cefTRIAXone  (ROCEPHIN )  IV Stopped (09/23/24 2204)   sodium bicarbonate  75 mEq in sodium  chloride 0.45 % 1,075 mL infusion 125 mL/hr at 09/24/24 0926   PRN Meds: acetaminophen  **OR** acetaminophen , camphor-menthol , ipratropium-albuterol , lactulose , ondansetron  **OR** ondansetron  (ZOFRAN ) IV  Time spent: 55 minutes  Author: ELVAN SOR. MD [facility] 09/24/2024 4:28 PM  To reach On-call, see care teams to locate the attending and reach out to them via [email or web address]. If 7PM-7AM, please contact night-coverage If you still have difficulty reaching the attending provider, please page the [facility] (Director on Call) for [facility] on amion for assistance.

## 2024-09-24 NOTE — Consult Note (Signed)
 [facility] Consult Note:09/24/2024     Date of Admission:  09/22/2024           Reason for Consult:  AKI   Referring Provider: Von Bellis, MD Primary Care Provider: Pcp, No   History of Presenting Illness:  Rodney Estrada is a 73 y.o. male With multiple medical problems including history of stroke, peripheral vascular disease, hypertension, type 2 diabetes presented to the emergency room with altered mental status.  He is a resident of nursing facility at [facility].  Most of the information is obtained from the chart.  He is not able to provide any meaningful information.  He has history of chronic left hydronephrosis, history of left kidney stone Nephrology consult has been requested for evaluation of acute kidney injury.  Patient's baseline creatinine is unknown.  His last known creatinine is 1.54 from 08/04/2022.  Presenting creatinine is 5.40 which has increased to 6.31 today.  Phosphorus is elevated at 8.3.  BUN elevated at 115.  He is anemic with hemoglobin of 9.9 and transferrin saturation is 8%.  In the past his A1c was poorly controlled.  It was 13.5% in July 2021.  Most recent hemoglobin A1c is 5.6% from 09/22/2024.  Review of Systems: ROS-not available due to patient's altered mental status and critical condition.  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   a.) s/p mechanical thrombectomy + stenting (2.5 x 15 mm Resolute Onyx) + TPA   Aortic atherosclerosis    Atrial fibrillation (HCC)    a.) CHA2DS2VASc = 7 (age, CHF, HTN, CVA x2, vascular disease history, T2DM);  b.) rate/rhythm maintained on oral labetalol ; chronically anticoagulated with ASA + clopidogrel    Basal cell carcinoma    Bilateral carotid artery stenosis    a.) CTA neck 04/05/2020: 40-50% LICA   CAD (coronary artery disease)    Chronic gingivitis    Chronic systolic (congestive) heart failure (HCC)    a.) TTE 04/06/2020: EF 50-55%, mod LAE, mod AoV  sclerosis/calc, triv TR/PR, mild MR, G2DD; b.) TTE 08/29/2021: EF >55%, mod LAE, triv AR/TR/PR. mild MR, G1DD.   Cocaine use    a.) last UDS (+) on 04/05/2020   Constipation    DOE (dyspnea on exertion)    Functional dyspepsia    Gout    Hemorrhoids    History of aspiration pneumonitis    History of ETOH abuse    HLD (hyperlipidemia)    Hypertension    Ileus (HCC)    Infection due to ESBL-producing Escherichia coli 08/02/2022   Left hemiplegia (HCC) 04/05/2020   a.) s/p RIGHT MCA CVA   Long term current use of antithrombotics/antiplatelets    a.) on DAPT (ASA + clopidogrel )   Major depressive disorder    Muscle weakness    Nephrolithiasis    PVD (peripheral vascular disease)    SBO (small bowel obstruction) (HCC) 07/23/2022   Seborrhea capitis    T2DM (type 2 diabetes mellitus) (HCC)    Vitamin D deficiency     Social History[1]  Family History  Problem Relation Age of Onset   Hypertension Mother    Hypertension Father      OBJECTIVE: Blood pressure (!) 82/48, pulse 70, temperature 98 F (36.7 C), temperature source Oral, resp. rate 20, height 5' 9 (1.753 m), weight 77 kg, SpO2 98%.  Physical Exam General Appearance-chronically ill-appearing HEENT-dry oral mucous membranes Pulmonary-normal breathing effort, [place] O2 4 L Cardiac-no rub Abdomen-distended, nontender, condom catheter in  place Extremities-no significant peripheral edema Neuro-very lethargic.  Did not respond to voice or tactile stimuli.   Lab Results Lab Results  Component Value Date   WBC 18.4 (H) 09/24/2024   HGB 9.9 (L) 09/24/2024   HCT 31.7 (L) 09/24/2024   MCV 95.5 09/24/2024   PLT 163 09/24/2024    Lab Results  Component Value Date   CREATININE 6.31 (H) 09/24/2024   BUN 115 (H) 09/24/2024   NA 140 09/24/2024   K 5.1 09/24/2024   CL 101 09/24/2024   CO2 13 (L) 09/24/2024    Lab Results  Component Value Date   ALT 11 09/22/2024   AST 13 (L) 09/22/2024   ALKPHOS 70 09/22/2024    BILITOT 0.3 09/22/2024     Microbiology: Recent Results (from the past 240 hours)  Culture, blood (routine x 2)     Status: Abnormal (Preliminary result)   Collection Time: 09/22/24  5:37 PM   Specimen: BLOOD LEFT HAND  Result Value Ref Range Status   Specimen Description   Final    BLOOD LEFT HAND Performed at [facility], [address], [place], [place] [address]    Special Requests   Final    BOTTLES DRAWN AEROBIC AND ANAEROBIC Blood Culture results may not be optimal due to an inadequate volume of blood received in culture bottles Performed at [facility], [address] [place], [place] [address]    Culture  Setup Time   Final    GRAM POSITIVE COCCI AEROBIC BOTTLE ONLY CRITICAL VALUE NOTED.  VALUE IS CONSISTENT WITH PREVIOUSLY REPORTED AND CALLED VALUE. Performed at [facility], [address] [place], [place] [address]    Culture (A)  Final    STAPHYLOCOCCUS EPIDERMIDIS SUSCEPTIBILITIES TO FOLLOW Performed at [facility], [address], [place], [place] [address]    Report Status PENDING  Incomplete  Culture, blood (routine x 2)     Status: Abnormal (Preliminary result)   Collection Time: 09/22/24  5:57 PM   Specimen: Right Antecubital; Blood  Result Value Ref Range Status   Specimen Description   Final    RIGHT ANTECUBITAL Performed at [facility], [address] [place], [place] [address]    Special Requests   Final    BOTTLES DRAWN AEROBIC AND ANAEROBIC Blood Culture results may not be optimal due to an inadequate volume of blood received in culture bottles Performed at [facility], [address], [place], [place] [address]    Culture  Setup Time   Final    GRAM POSITIVE COCCI ANAEROBIC BOTTLE ONLY CRITICAL RESULT CALLED TO, READ BACK BY AND VERIFIED WITH: EMILY STEINBOCK 09/23/24 1424 KLW    Culture (A)  Final    STAPHYLOCOCCUS EPIDERMIDIS SUSCEPTIBILITIES TO FOLLOW Performed at [facility], [address] [place], [place] [address]    Report Status PENDING  Incomplete  Blood Culture ID Panel (Reflexed)     Status: Abnormal   Collection Time: 09/22/24  5:57 PM  Result Value Ref Range Status   Enterococcus faecalis NOT DETECTED NOT DETECTED Final   Enterococcus Faecium NOT DETECTED NOT DETECTED Final   Listeria monocytogenes NOT DETECTED NOT DETECTED Final   Staphylococcus species DETECTED (A) NOT DETECTED Final    Comment: CRITICAL RESULT CALLED TO, READ BACK BY AND VERIFIED WITH: EMILY STEINBOCK 09/23/24 1424 KLW    Staphylococcus aureus (BCID) NOT DETECTED NOT DETECTED Final   Staphylococcus epidermidis DETECTED (A) NOT DETECTED Final  Comment: Methicillin (oxacillin) resistant coagulase negative staphylococcus. Possible blood culture contaminant (unless isolated from more than one blood culture draw or clinical case suggests pathogenicity). No antibiotic treatment is indicated for blood  culture contaminants. CRITICAL RESULT CALLED TO, READ BACK BY AND VERIFIED WITH: EMILY STEINBOCK 09/23/24 1424 KLW    Staphylococcus lugdunensis NOT DETECTED NOT DETECTED Final   Streptococcus species NOT DETECTED NOT DETECTED Final   Streptococcus agalactiae NOT DETECTED NOT DETECTED Final   Streptococcus pneumoniae NOT DETECTED NOT DETECTED Final   Streptococcus pyogenes NOT DETECTED NOT DETECTED Final   A.calcoaceticus-baumannii NOT DETECTED NOT DETECTED Final   Bacteroides fragilis NOT DETECTED NOT DETECTED Final   Enterobacterales NOT DETECTED NOT DETECTED Final   Enterobacter cloacae complex NOT DETECTED NOT DETECTED Final   Escherichia coli NOT DETECTED NOT DETECTED Final   Klebsiella aerogenes NOT DETECTED NOT DETECTED Final   Klebsiella oxytoca NOT DETECTED NOT DETECTED Final   Klebsiella pneumoniae NOT DETECTED NOT DETECTED Final   Proteus species NOT DETECTED NOT DETECTED Final   Salmonella species NOT DETECTED NOT DETECTED Final   Serratia marcescens NOT  DETECTED NOT DETECTED Final   Haemophilus influenzae NOT DETECTED NOT DETECTED Final   Neisseria meningitidis NOT DETECTED NOT DETECTED Final   Pseudomonas aeruginosa NOT DETECTED NOT DETECTED Final   Stenotrophomonas maltophilia NOT DETECTED NOT DETECTED Final   Candida albicans NOT DETECTED NOT DETECTED Final   Candida auris NOT DETECTED NOT DETECTED Final   Candida glabrata NOT DETECTED NOT DETECTED Final   Candida krusei NOT DETECTED NOT DETECTED Final   Candida parapsilosis NOT DETECTED NOT DETECTED Final   Candida tropicalis NOT DETECTED NOT DETECTED Final   Cryptococcus neoformans/gattii NOT DETECTED NOT DETECTED Final   Methicillin resistance mecA/C DETECTED (A) NOT DETECTED Final    Comment: CRITICAL RESULT CALLED TO, READ BACK BY AND VERIFIED WITH: EMILY STEINBOCK 09/23/24 1424 KLW Performed at [facility], [address] [place], [place] [address]   Resp panel by RT-PCR (RSV, Flu A&B, Covid) Anterior Nasal Swab     Status: None   Collection Time: 09/22/24  5:58 PM   Specimen: Anterior Nasal Swab  Result Value Ref Range Status   SARS Coronavirus 2 by RT PCR NEGATIVE NEGATIVE Final    Comment: (NOTE) SARS-CoV-2 target nucleic acids are NOT DETECTED.  The SARS-CoV-2 RNA is generally detectable in upper respiratory specimens during the acute phase of infection. The lowest concentration of SARS-CoV-2 viral copies this assay can detect is 138 copies/mL. A negative result does not preclude SARS-Cov-2 infection and should not be used as the sole basis for treatment or other patient management decisions. A negative result may occur with  improper specimen collection/handling, submission of specimen other than nasopharyngeal swab, presence of viral mutation(s) within the areas targeted by this assay, and inadequate number of viral copies(<138 copies/mL). A negative result must be combined with clinical observations, patient history, and epidemiological information.  The expected result is Negative.  Fact Sheet for Patients:  bloggercourse.com  Fact Sheet for Healthcare Providers:  seriousbroker.it  This test is no t yet approved or cleared by the [place]  FDA and  has been authorized for detection and/or diagnosis of SARS-CoV-2 by FDA under an Emergency Use Authorization (EUA). This EUA will remain  in effect (meaning this test can be used) for the duration of the COVID-19 declaration under Section 564(b)(1) of the Act, 21 U.S.C.section 360bbb-3(b)(1), unless the authorization is terminated  or revoked sooner.  Influenza A by PCR NEGATIVE NEGATIVE Final   Influenza B by PCR NEGATIVE NEGATIVE Final    Comment: (NOTE) The Xpert Xpress SARS-CoV-2/FLU/RSV plus assay is intended as an aid in the diagnosis of influenza from Nasopharyngeal swab specimens and should not be used as a sole basis for treatment. Nasal washings and aspirates are unacceptable for Xpert Xpress SARS-CoV-2/FLU/RSV testing.  Fact Sheet for Patients: bloggercourse.com  Fact Sheet for Healthcare Providers: seriousbroker.it  This test is not yet approved or cleared by the [place]  FDA and has been authorized for detection and/or diagnosis of SARS-CoV-2 by FDA under an Emergency Use Authorization (EUA). This EUA will remain in effect (meaning this test can be used) for the duration of the COVID-19 declaration under Section 564(b)(1) of the Act, 21 U.S.C. section 360bbb-3(b)(1), unless the authorization is terminated or revoked.     Resp Syncytial Virus by PCR NEGATIVE NEGATIVE Final    Comment: (NOTE) Fact Sheet for Patients: bloggercourse.com  Fact Sheet for Healthcare Providers: seriousbroker.it  This test is not yet approved or cleared by the [place]  FDA and has been authorized for detection and/or  diagnosis of SARS-CoV-2 by FDA under an Emergency Use Authorization (EUA). This EUA will remain in effect (meaning this test can be used) for the duration of the COVID-19 declaration under Section 564(b)(1) of the Act, 21 U.S.C. section 360bbb-3(b)(1), unless the authorization is terminated or revoked.  Performed at [facility], [address] [place], [place] [address]     Medications: Scheduled Meds:  ARIPiprazole   2 mg Oral QHS   artificial tears  1 drop Both Eyes QHS   aspirin   81 mg Oral Daily   atorvastatin   40 mg Oral QPM   clopidogrel   75 mg Oral Daily   guaiFENesin   600 mg Oral BID   levETIRAcetam   500 mg Oral Q24H   Or   levETIRAcetam   500 mg Intravenous Q24H   oxybutynin   5 mg Oral QHS   pantoprazole   40 mg Oral BID   senna-docusate  2 tablet Oral QHS   sertraline   175 mg Oral Daily   vancomycin  variable dose per unstable renal function (pharmacist dosing)   Does not apply See admin instructions   [START ON 09/29/2024] Vitamin D (Ergocalciferol)  50,000 Units Oral Q7 days   Continuous Infusions:  cefTRIAXone  (ROCEPHIN )  IV Stopped (09/23/24 2204)   sodium bicarbonate  75 mEq in sodium chloride  0.45 % 1,075 mL infusion 125 mL/hr at 09/24/24 0926   PRN Meds:.acetaminophen  **OR** acetaminophen , camphor-menthol , ipratropium-albuterol , lactulose , ondansetron  **OR** ondansetron  (ZOFRAN ) IV  Allergies[2]  Urinalysis: Recent Labs    09/22/24 1758  COLORURINE AMBER*  LABSPEC 1.018  PHURINE 7.0  GLUCOSEU NEGATIVE  HGBUR LARGE*  BILIRUBINUR NEGATIVE  KETONESUR NEGATIVE  PROTEINUR 100*  NITRITE NEGATIVE  LEUKOCYTESUR MODERATE*      Imaging: MR BRAIN WO CONTRAST Result Date: 09/24/2024 EXAM: MRI BRAIN WITHOUT CONTRAST 09/23/2024 12:41:34 PM TECHNIQUE: Multiplanar multisequence MRI of the head/brain was performed without the administration of intravenous contrast. COMPARISON: MR Head 04/06/2020. CLINICAL HISTORY: Neuro deficit, acute, stroke suspected.  FINDINGS: BRAIN AND VENTRICLES: No acute infarct. No intracranial hemorrhage. No mass. No midline shift. No hydrocephalus. Advanced encephalomalacia is present throughout the right MCA territory with ex vacuo dilatation of the right lateral ventricle. There is extensive gliosis throughout the upper right hemisphere. Multifocal hyperintense T2-weighted signal is noted within the cerebral white matter, most commonly due to chronic small vessel disease. Wallerian degeneration of the right cerebral peduncle is  present. Chronic blood products are seen in the lateral right hemisphere. The sella is unremarkable. Normal flow voids. ORBITS: No acute abnormality. SINUSES AND MASTOIDS: No acute abnormality. BONES AND SOFT TISSUES: Normal marrow signal. No acute soft tissue abnormality. IMPRESSION: 1. No acute intracranial abnormality. 2. Chronic right MCA territory encephalomalacia with ex vacuo dilatation of the right lateral ventricle, extensive right hemispheric gliosis, and Wallerian degeneration of the right cerebral peduncle. 3. Multifocal T2 hyperintense signal within the cerebral white matter, most commonly due to chronic small vessel disease. Electronically signed by: Franky Stanford MD 09/24/2024 03:46 AM EST RP Workstation: [id number]   ECHOCARDIOGRAM COMPLETE Result Date: 09/23/2024    ECHOCARDIOGRAM REPORT   Patient Name:   XABI WITTLER Date of Exam: 09/23/2024 Medical Rec #:  [id number]    Height:       69.0 in Accession #:    [id number]   Weight:       169.0 lb Date of Birth:  1951/09/11     BSA:          1.923 m Patient Age:    73 years     BP:           88/53 mmHg Patient Gender: M            HR:           60 bpm. Exam Location:  [facility] Procedure: 2D Echo, Cardiac Doppler and Color Doppler (Both Spectral and Color            Flow Doppler were utilized during procedure). Indications:     Elevated troponin  History:         Patient has prior history of Echocardiogram examinations, most                  recent  04/06/2020. Risk Factors:Hypertension. Cocaine use.  Sonographer:     Christopher Furnace Referring Phys:  [id number] JAN A MANSY Diagnosing Phys: Lonni End MD  Sonographer Comments: Technically difficult study due to poor echo windows and no apical window. Image acquisition challenging due to respiratory motion and All views obtainable were from modified views. IMPRESSIONS  1. Left ventricular ejection fraction, by estimation, is >55%. The left ventricle has normal function. Left ventricular endocardial border not optimally defined to evaluate regional wall motion. There is moderate left ventricular hypertrophy. Left ventricular diastolic function could not be evaluated.  2. Right ventricular systolic function is normal. The right ventricular size is normal. Mildly increased right ventricular wall thickness.  3. The mitral valve was not well visualized. No evidence of mitral valve regurgitation.  4. Tricuspid valve regurgitation could not be accurately assessed.  5. The aortic valve has an indeterminant number of cusps. There is mild calcification of the aortic valve. There is mild thickening of the aortic valve. Aortic valve regurgitation is not visualized. Aortic valve gradient could not be assessed.  6. Pulmonic valve regurgitation could not be accurately assessed.  7. The inferior vena cava is normal in size with greater than 50% respiratory variability, suggesting right atrial pressure of 3 mmHg. FINDINGS  Left Ventricle: Left ventricular ejection fraction, by estimation, is >55%. The left ventricle has normal function. Left ventricular endocardial border not optimally defined to evaluate regional wall motion. The left ventricular internal cavity size was  normal in size. There is moderate left ventricular hypertrophy. Left ventricular diastolic function could not be evaluated. Right Ventricle: The right ventricular size is normal. Mildly increased right ventricular wall thickness. Right ventricular systolic function  is normal. Left Atrium: Left atrial size was not well visualized. Right Atrium: Right atrial size was not well visualized. Pericardium: There is no evidence of pericardial effusion. Mitral Valve: The mitral valve was not well visualized. Mild mitral annular calcification. No evidence of mitral valve regurgitation. Tricuspid Valve: The tricuspid valve is not well visualized. Tricuspid valve regurgitation could not be accurately assessed. Aortic Valve: The aortic valve has an indeterminant number of cusps. There is mild calcification of the aortic valve. There is mild thickening of the aortic valve. Aortic valve regurgitation is not visualized. Aortic valve gradient could not be assessed. Pulmonic Valve: The pulmonic valve was not well visualized. Pulmonic valve regurgitation could not be accurately assessed. Aorta: The aortic root is normal in size and structure. Pulmonary Artery: The pulmonary artery is not well seen. Venous: The inferior vena cava is normal in size with greater than 50% respiratory variability, suggesting right atrial pressure of 3 mmHg. IAS/Shunts: No atrial level shunt detected by color flow Doppler.  LEFT VENTRICLE PLAX 2D LVIDd:         4.50 cm LVIDs:         3.30 cm LV PW:         1.30 cm LV IVS:        1.40 cm LVOT diam:     2.00 cm LVOT Area:     3.14 cm  LEFT ATRIUM         Index LA diam:    4.20 cm 2.18 cm/m   AORTA Ao Root diam: 3.30 cm  SHUNTS Systemic Diam: 2.00 cm Lonni Hanson MD Electronically signed by Lonni Hanson MD Signature Date/Time: 09/23/2024/7:12:34 PM    Final    DG Abd 1 View Result Date: 09/23/2024 CLINICAL DATA:  Urinary tract infection.  Altered mental status. EXAM: ABDOMEN - 1 VIEW COMPARISON:  Radiograph 08/01/2022 FINDINGS: Possible right femoral catheter with tip terminating in the region of the right iliac vessels, alternatively this may be external to the patient. Slight gaseous distention of colon and to a lesser extent small bowel in the left abdomen.  Vascular calcifications as well as calcifications projecting over the left renal shadow, renal stones versus renal vascular calcifications. No evidence of free air on supine views. IMPRESSION: 1. Possible right femoral catheter with tip terminating in the region of the right iliac vessels, alternatively this may be external to the patient. 2. Slight gaseous distention of colon and to a lesser extent small bowel in the left abdomen, may represent ileus. 3. Possible left-sided urolithiasis. Electronically Signed   By: Andrea Gasman M.D.   On: 09/23/2024 16:02   CT Head Wo Contrast Result Date: 09/22/2024 EXAM: CT HEAD WITHOUT CONTRAST 09/22/2024 06:47:02 PM TECHNIQUE: CT of the head was performed without the administration of intravenous contrast. Automated exposure control, iterative reconstruction, and/or weight based adjustment of the mA/kV was utilized to reduce the radiation dose to as low as reasonably achievable. COMPARISON: None available. CLINICAL HISTORY: Mental status change, unknown cause FINDINGS: BRAIN AND VENTRICLES: No acute hemorrhage. Large remote right MCA territory infarct. Vascular stent within the right MCA M2 segment. Mild periventricular white matter changes likely related to small vessel ischemia. Mild superimposed global parenchymal atrophy. Ex vacuo dilatation of right lateral ventricle. No extra-axial collection. No mass effect or midline shift. Calcific atherosclerosis. ORBITS: No acute abnormality. SINUSES: Right maxillary sinus mucosal thickening. SOFT TISSUES AND SKULL: No acute soft tissue abnormality. No skull fracture. IMPRESSION: 1. Large remote right MCA territory infarct with  ex vacuo dilatation of the right lateral ventricle and a vascular stent in the right MCA M2 segment. 2. Mild chronic microvascular ischemic white matter changes. 3. Mild global cerebral parenchymal atrophy. 4. Calcific atherosclerosis. 5. Right maxillary sinus mucosal thickening. Electronically signed  by: Dorethia Molt MD 09/22/2024 08:07 PM EST RP Workstation: [id number]   DG Chest Port 1 View Result Date: 09/22/2024 EXAM: 1 VIEW(S) XRAY OF THE CHEST 09/22/2024 05:45:00 PM COMPARISON: None available. CLINICAL HISTORY: Hypoxia FINDINGS: LUNGS AND PLEURA: Low lung volumes. No focal pulmonary opacity. No pleural effusion. No pneumothorax. HEART AND MEDIASTINUM: Aortic calcification. No acute abnormality of the cardiac and mediastinal silhouettes. BONES AND SOFT TISSUES: No acute osseous abnormality. UPPER ABDOMEN: Gastric gaseous distention. IMPRESSION: 1. No acute cardiopulmonary abnormality. 2. Low lung volumes. 3. Aortic calcification. Electronically signed by: Dorethia Molt MD 09/22/2024 08:05 PM EST RP Workstation: [id number]      Assessment/Plan:  Laquentin Loudermilk is a 73 y.o. male with medical problems of history of stroke, peripheral vascular disease, hypertension, type 2 diabetes  was admitted on 09/22/2024 for :  Acute metabolic encephalopathy [G93.41]  Acute kidney injury Chronic kidney disease, stage IIIa.  Creatinine 1.54/GFR 49 from 08/04/2022 Diabetes type 2 with chronic kidney disease History of chronic left hydronephrosis  history of kidney stones and left ureteral stent placement and removal in February 2024 Acute metabolic encephalopathy Acute metabolic acidosis  Patient has severe acute kidney injury.  He has history of kidney stones, left hydronephrosis and left ureteral stent placement.   He is currently getting treatment with IV antibiotics for sepsis caused by UTI.  Renal ultrasound has been performed but the results are pending. Recommend palliative care assessment to establish goals of care before aggressive therapies such as dialysis can be offered.  Given his poor general health condition and multiple comorbidities, he would not make a good candidate for outpatient dialysis.  Agree with symptomatic treatment with IV sodium bicarbonate  to manage metabolic acidosis.  IV  antibiotics as per primary team to treat underlying sepsis/UTI.  We will follow along during hospitalization.  Rodney Estrada 09/24/2024     [1]  Social History Tobacco Use   Smoking status: Every Day   Smokeless tobacco: Never  Substance Use Topics   Alcohol  use: Yes    Alcohol /week: 10.0 standard drinks of alcohol     Types: 10 Cans of beer per week    Comment: every DAy   Drug use: Not Currently  [2] No Known Allergies

## 2024-09-24 NOTE — Progress Notes (Signed)
 Pharmacy Antibiotic Note  Rodney Estrada is a 73 y.o. male admitted on 09/22/2024 with bacteremia.  Pharmacy has been consulted for vancomycin  dosing.  -Blood cx 12/29: 1 of 4 (anaerobic)- GPC: staph epi w/ mecA resistance  -also with possible UTI, on ceftriaxone   Plan: Patient received Vancomycin  1750 mg IV x 1 on 12/29 @ 2307 Scr 5.33  Crcl 12.7 ml/min With unstable renal fxn will check a random vancomycin  level ~ 24 hours after dose. Further dosing to be based on level   12/30:  Vanc random @ 2345 = 20 ,  therapeutic - Will order Vancomycin  1750 mg IV X 1 for 12/31 @ ~ 0100. - recheck Vanc level in 24 hrs on 01/01 @ 0000  Continue to follow renal fxn, cultures, length of therapy, etc   Height: 5' 9 (175.3 cm) Weight: 77 kg (169 lb 12.1 oz) IBW/kg (Calculated) : 70.7  Temp (24hrs), Avg:98.5 F (36.9 C), Min:97.5 F (36.4 C), Max:100.1 F (37.8 C)  Recent Labs  Lab 09/22/24 1758 09/23/24 0628 09/23/24 2345  WBC 11.4* 10.0  --   CREATININE 5.40* 5.33*  --   LATICACIDVEN 1.0  --   --   VANCORANDOM  --   --  20    Estimated Creatinine Clearance: 12.7 mL/min (A) (by C-G formula based on SCr of 5.33 mg/dL (H)).    Allergies[1]  Antimicrobials this admission: Cefepime  x 1 12/29  Ceftriaxone  12/30 >>   Vancomycin   12/29(evening) >>    Dose adjustments this admission:    Microbiology results: Blood cx 12/29: 1 of 4 (anaerobic)- GPC: staph epi w/ mecA resistance    UCx:      Sputum:      MRSA PCR:    Thank you for allowing pharmacy to be a part of this patients care.  Allean Haas PharmD Clinical Pharmacist 09/24/2024       [1] No Known Allergies

## 2024-09-24 NOTE — Plan of Care (Signed)
" °  Problem: Clinical Measurements: Goal: Respiratory complications will improve Outcome: Progressing Goal: Cardiovascular complication will be avoided Outcome: Progressing   Problem: Elimination: Goal: Will not experience complications related to bowel motility Outcome: Progressing Goal: Will not experience complications related to urinary retention Outcome: Progressing   Problem: Pain Managment: Goal: General experience of comfort will improve and/or be controlled Outcome: Progressing   Problem: Skin Integrity: Goal: Risk for impaired skin integrity will decrease Outcome: Progressing   Problem: Clinical Measurements: Goal: Diagnostic test results will improve Outcome: Not Progressing   Problem: Activity: Goal: Risk for activity intolerance will decrease Outcome: Not Progressing   Problem: Nutrition: Goal: Adequate nutrition will be maintained Outcome: Not Progressing   "

## 2024-09-24 NOTE — Progress Notes (Signed)
 MEWS Progress Note  [name] MRN: [id number] DOB: 12-28-1950 Today's Date: 09/24/2024   MEWS Flowsheet Documentation:  Assess: MEWS Score Temp: 98.2 F (36.8 C) BP: (!) 79/46 MAP (mmHg): (!) 58 Pulse Rate: 66 ECG Heart Rate: 63 Resp: 14 Level of Consciousness: Alert SpO2: 96 % O2 Device: Nasal Cannula O2 Flow Rate (L/min): 4 L/min Assess: MEWS Score MEWS Temp: 0 MEWS Systolic: 2 MEWS Pulse: 0 MEWS RR: 0 MEWS LOC: 0 MEWS Score: 2 MEWS Score Color: Yellow Assess: SIRS CRITERIA SIRS Temperature : 0 SIRS Respirations : 0 SIRS Pulse: 0 SIRS WBC: 0 SIRS Score Sum : 0 Assess: if the MEWS score is Yellow or Red Were vital signs accurate and taken at a resting state?: Yes Does the patient meet 2 or more of the SIRS criteria?: Yes Does the patient have a confirmed or suspected source of infection?: Yes MEWS guidelines implemented : Yes, yellow Treat MEWS Interventions: Considered administering scheduled or prn medications/treatments as ordered Take Vital Signs Increase Vital Sign Frequency : Yellow: Q2hr x1, continue Q4hrs until patient remains green for 12hrs Escalate MEWS: Escalate: Yellow: Discuss with charge nurse and consider notifying provider and/or RRT    Dr. Von notified and placed new orders.    Dewight Catino D Yarbrough 09/24/2024, 10:12 AM

## 2024-09-24 NOTE — Progress Notes (Signed)
 " [facility] CARDIOLOGY PROGRESS NOTE       Patient ID: Rodney Estrada MRN: [id number] DOB/AGE: 02-Apr-1951 73 y.o.  Admit date: 09/22/2024 Referring Physician Dr. Madison Peaches Primary Physician Pcp, No  Primary Cardiologist Dr. Hester (last seen 09/2021) Reason for Consultation elevated troponins  HPI: Rodney Estrada is a 73 y.o. male  with a past medical history of coronary artery disease by CT scan, chronic diastolic CHF, gout, hypertension, dyslipidemia, atrial fibrillation and hx CVA who presented to the ED on 09/22/2024 for altered mental status. Troponins mildly elevated and flat. Cardiology was consulted for further evaluation.   Interval history: - Patient seen and examined this morning, laying in hospital bed. - Does not arouse to questioning but appears comfortable. - BP remains soft, currently being treated for UTI, sepsis.  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   a.) s/p mechanical thrombectomy + stenting (2.5 x 15 mm Resolute Onyx) + TPA   Aortic atherosclerosis    Atrial fibrillation (HCC)    a.) CHA2DS2VASc = 7 (age, CHF, HTN, CVA x2, vascular disease history, T2DM);  b.) rate/rhythm maintained on oral labetalol ; chronically anticoagulated with ASA + clopidogrel    Basal cell carcinoma    Bilateral carotid artery stenosis    a.) CTA neck 04/05/2020: 40-50% LICA   CAD (coronary artery disease)    Chronic gingivitis    Chronic systolic (congestive) heart failure (HCC)    a.) TTE 04/06/2020: EF 50-55%, mod LAE, mod AoV sclerosis/calc, triv TR/PR, mild MR, G2DD; b.) TTE 08/29/2021: EF >55%, mod LAE, triv AR/TR/PR. mild MR, G1DD.   Cocaine use    a.) last UDS (+) on 04/05/2020   Constipation    DOE (dyspnea on exertion)    Functional dyspepsia    Gout    Hemorrhoids    History of aspiration pneumonitis    History of ETOH abuse    HLD (hyperlipidemia)     Hypertension    Ileus (HCC)    Infection due to ESBL-producing Escherichia coli 08/02/2022   Left hemiplegia (HCC) 04/05/2020   a.) s/p RIGHT MCA CVA   Long term current use of antithrombotics/antiplatelets    a.) on DAPT (ASA + clopidogrel )   Major depressive disorder    Muscle weakness    Nephrolithiasis    PVD (peripheral vascular disease)    SBO (small bowel obstruction) (HCC) 07/23/2022   Seborrhea capitis    T2DM (type 2 diabetes mellitus) (HCC)    Vitamin D deficiency     Past Surgical History:  Procedure Laterality Date   CYSTOSCOPY W/ URETERAL STENT REMOVAL Left 11/14/2022   Procedure: CYSTOSCOPY WITH STENT REMOVAL;  Surgeon: Twylla Glendia BROCKS, MD;  Location: [facility] ORS;  Service: Urology;  Laterality: Left;   CYSTOSCOPY/URETEROSCOPY/HOLMIUM LASER/STENT PLACEMENT Left 08/02/2022   Procedure: CYSTOSCOPY/URETEROSCOPY/HOLMIUM LASER/STENT PLACEMENT;  Surgeon: Twylla Glendia BROCKS, MD;  Location: [facility] OR[facility];  Service: Urology;  Laterality: Left;   CYSTOSCOPY/URETEROSCOPY/HOLMIUM LASER/STENT PLACEMENT Left 08/29/2022   Procedure: CYSTOSCOPY/URETEROSCOPY/HOLMIUM LASER/STENT EXCHANGE;  Surgeon: Twylla Glendia BROCKS, MD;  Location: [facility] OR[facility];  Service: Urology;  Laterality: Left;   IR CT HEAD LTD  04/05/2020   IR FLUORO GUIDE CV LINE RIGHT  08/01/2022   IR INTRA CRAN STENT  04/05/2020   IR PERCUTANEOUS ART THROMBECTOMY/INFUSION INTRACRANIAL INC DIAG ANGIO  04/05/2020       IR PERCUTANEOUS ART THROMBECTOMY/INFUSION INTRACRANIAL INC DIAG ANGIO  04/05/2020   IR US  GUIDE VASC ACCESS RIGHT  08/01/2022   RADIOLOGY WITH ANESTHESIA N/A 04/05/2020   Procedure: IR WITH ANESTHESIA;  Surgeon: Radiologist, Medication, MD;  Location: [facility];  Service: Radiology;  Laterality: N/A;    Medications Prior to Admission  Medication Sig Dispense Refill Last Dose/Taking   acetaminophen  (TYLENOL ) 500 MG tablet Take 1,000 mg by mouth 2 (two) times daily.   09/22/2024 Morning   allopurinol  (ZYLOPRIM ) 100 MG tablet Take 100 mg  by mouth daily.   09/22/2024 Morning   amLODipine  (NORVASC ) 10 MG tablet Take 10 mg by mouth daily.   09/22/2024 Morning   ARIPiprazole  (ABILIFY ) 2 MG tablet Take 2 mg by mouth at bedtime.   09/21/2024 Bedtime   aspirin  81 MG chewable tablet Chew 1 tablet (81 mg total) by mouth daily.   09/22/2024 Morning   atorvastatin  (LIPITOR ) 40 MG tablet Take 1 tablet (40 mg total) by mouth every evening.   09/21/2024 Evening   camphor-menthol  (SARNA) lotion Apply 1 Application topically as needed for itching.   09/22/2024 Noon   carboxymethylcellulose (REFRESH PLUS) 0.5 % SOLN Place 1 drop into both eyes 3 (three) times daily.   09/22/2024 Morning   cefTRIAXone  (ROCEPHIN ) 1 g injection Inject 1 g into the muscle daily.   09/22/2024 Noon   clopidogrel  (PLAVIX ) 75 MG tablet Take 1 tablet (75 mg total) by mouth daily.   09/22/2024 Morning   furosemide  (LASIX ) 20 MG tablet Take 20 mg by mouth daily.   09/22/2024 Morning   gabapentin  (NEURONTIN ) 300 MG capsule Take 300 mg by mouth 3 (three) times daily.   09/22/2024 Morning   guaiFENesin  (MUCINEX ) 600 MG 12 hr tablet Take 600 mg by mouth 2 (two) times daily.   09/22/2024 Morning   ipratropium-albuterol  (DUONEB) 0.5-2.5 (3) MG/3ML SOLN Inhale 3 mLs into the lungs every 4 (four) hours as needed.   09/22/2024 Noon   labetalol  (NORMODYNE ) 100 MG tablet Take 1 tablet (100 mg total) by mouth 3 (three) times daily. (Patient taking differently: Take 100 mg by mouth 3 (three) times daily. Hold for SBP 100 or less or pulse less than 60, notify provider)   09/21/2024 Evening   lactulose  (CHRONULAC ) 10 GM/15ML solution Take 20 g by mouth 2 (two) times daily as needed for mild constipation.   Unknown   levETIRAcetam  (KEPPRA ) 500 MG tablet Take 500 mg by mouth 2 (two) times daily.   09/22/2024 Morning   lisinopril  (ZESTRIL ) 40 MG tablet Take 40 mg by mouth at bedtime.   09/21/2024 Bedtime   oxybutynin  (DITROPAN -XL) 5 MG 24 hr tablet Take 1 tablet (5 mg total) by mouth at  bedtime.   09/21/2024 Bedtime   pantoprazole  (PROTONIX ) 40 MG tablet Take 1 tablet (40 mg total) by mouth 2 (two) times daily.   09/22/2024 Morning   polyethylene glycol (MIRALAX  / GLYCOLAX ) 17 g packet Take 17 g by mouth daily as needed for mild constipation. 14 each 0 09/22/2024 Morning   predniSONE (DELTASONE) 20 MG tablet Take 20 mg by mouth daily.   09/21/2024 Evening   selenium sulfide (SELSUN) 1 % LOTN Apply 1 Application topically daily.   09/22/2024 Noon   senna-docusate (SENOKOT-S) 8.6-50 MG tablet Take 2 tablets by mouth at bedtime.   09/21/2024 Bedtime   sertraline  (ZOLOFT ) 100 MG tablet Take 100 mg by mouth daily.   09/22/2024 Morning   sertraline  (ZOLOFT ) 25 MG tablet Take 25 mg by mouth daily.   09/22/2024 Morning   sertraline  (ZOLOFT )  50 MG tablet Take 50 mg by mouth daily.   09/22/2024 Morning   SYSTANE BALANCE 0.6 % SOLN Apply 1 drop to eye at bedtime. Both eyes   09/21/2024 Bedtime   Vitamin D, Ergocalciferol, (DRISDOL) 1.25 MG (50000 UNIT) CAPS capsule Take 50,000 Units by mouth every 7 (seven) days. Mondays   09/22/2024 Noon   Social History   Socioeconomic History   Marital status: Single    Spouse name: Not on file   Number of children: Not on file   Years of education: Not on file   Highest education level: Not on file  Occupational History   Not on file  Tobacco Use   Smoking status: Every Day   Smokeless tobacco: Never  Substance and Sexual Activity   Alcohol  use: Yes    Alcohol /week: 10.0 standard drinks of alcohol     Types: 10 Cans of beer per week    Comment: every DAy   Drug use: Not Currently   Sexual activity: Not on file  Other Topics Concern   Not on file  Social History Narrative   Not on file   Social Drivers of Health   Tobacco Use: High Risk (09/22/2024)   Patient History    Smoking Tobacco Use: Every Day    Smokeless Tobacco Use: Never    Passive Exposure: Not on file  Financial Resource Strain: Not on file  Food Insecurity: Patient  Unable To Answer (09/22/2024)   Epic    Worried About Programme Researcher, Broadcasting/film/video in the Last Year: Patient unable to answer    Ran Out of Food in the Last Year: Patient unable to answer  Transportation Needs: Patient Unable To Answer (09/22/2024)   Epic    Lack of Transportation (Medical): Patient unable to answer    Lack of Transportation (Non-Medical): Patient unable to answer  Physical Activity: Not on file  Stress: Not on file  Social Connections: Patient Unable To Answer (09/22/2024)   Social Connection and Isolation Panel    Frequency of Communication with Friends and Family: Patient unable to answer    Frequency of Social Gatherings with Friends and Family: Patient unable to answer    Attends Religious Services: Patient unable to answer    Active Member of Clubs or Organizations: Patient unable to answer    Attends Banker Meetings: Patient unable to answer    Marital Status: Patient unable to answer  Intimate Partner Violence: Patient Unable To Answer (09/22/2024)   Epic    Fear of Current or Ex-Partner: Patient unable to answer    Emotionally Abused: Patient unable to answer    Physically Abused: Patient unable to answer    Sexually Abused: Patient unable to answer  Depression (PHQ2-9): Not on file  Alcohol  Screen: Not on file  Housing: Patient Unable To Answer (09/22/2024)   Epic    Unable to Pay for Housing in the Last Year: Patient unable to answer    Number of Times Moved in the Last Year: Not on file    Homeless in the Last Year: Patient unable to answer  Utilities: Patient Unable To Answer (09/22/2024)   Epic    Threatened with loss of utilities: Patient unable to answer  Health Literacy: Not on file    Family History  Problem Relation Age of Onset   Hypertension Mother    Hypertension Father      Vitals:   09/23/24 2339 09/24/24 0505 09/24/24 0808 09/24/24 1018  BP: (!) 94/50 (!) 92/54 ROLLEN)  79/46 (!) 82/48  Pulse: 63 66 66 70  Resp:  15 14 20   Temp:  98.6 F (37 C) 97.8 F (36.6 C) 98.2 F (36.8 C) 98 F (36.7 C)  TempSrc: Axillary Axillary  Oral  SpO2: 95% 100% 96% 98%  Weight:      Height:        PHYSICAL EXAM General: Chronically ill appearing male, well nourished, in no acute distress. HEENT: Normocephalic and atraumatic. Neck: No JVD.  Lungs: Normal respiratory effort on 4L [place]. Clear bilaterally to auscultation. No wheezes, crackles, rhonchi.  Heart: HRRR. Normal S1 and S2 without gallops or murmurs.  Abdomen: Non-distended appearing.  Msk: Normal strength and tone for age. Extremities: Warm and well perfused. No clubbing, cyanosis. No edema.  Neuro: Alert and oriented X 3. Psych: Answers questions appropriately.   Labs: Basic Metabolic Panel: Recent Labs    09/23/24 0628 09/24/24 0445 09/24/24 0955  NA 142 140  --   K 4.8 5.1  --   CL 103 101  --   CO2 17* 13*  --   GLUCOSE 110* 126*  --   BUN 98* 115*  --   CREATININE 5.33* 6.31*  --   CALCIUM  9.4 8.4*  --   MG  --   --  2.6*  PHOS  --   --  8.3*   Liver Function Tests: Recent Labs    09/22/24 1758  AST 13*  ALT 11  ALKPHOS 70  BILITOT 0.3  PROT 7.1  ALBUMIN 4.1   No results for input(s): LIPASE, AMYLASE in the last 72 hours. CBC: Recent Labs    09/22/24 1758 09/23/24 0628 09/24/24 0445  WBC 11.4* 10.0 18.4*  NEUTROABS 9.1*  --   --   HGB 11.1* 11.2* 9.9*  HCT 34.9* 36.1* 31.7*  MCV 94.8 95.8 95.5  PLT 194 169 163   Cardiac Enzymes: No results for input(s): CKTOTAL, CKMB, CKMBINDEX, TROPONINIHS in the last 72 hours. BNP: No results for input(s): BNP in the last 72 hours. D-Dimer: No results for input(s): DDIMER in the last 72 hours. Hemoglobin A1C: Recent Labs    09/22/24 1758  HGBA1C 5.6   Fasting Lipid Panel: No results for input(s): CHOL, HDL, LDLCALC, TRIG, CHOLHDL, LDLDIRECT in the last 72 hours. Thyroid Function Tests: No results for input(s): TSH, T4TOTAL, T3FREE, THYROIDAB in the  last 72 hours.  Invalid input(s): FREET3 Anemia Panel: Recent Labs    09/24/24 0955  FOLATE 4.1*  TIBC 253  IRON 21*     Radiology: MR BRAIN WO CONTRAST Result Date: 09/24/2024 EXAM: MRI BRAIN WITHOUT CONTRAST 09/23/2024 12:41:34 PM TECHNIQUE: Multiplanar multisequence MRI of the head/brain was performed without the administration of intravenous contrast. COMPARISON: MR Head 04/06/2020. CLINICAL HISTORY: Neuro deficit, acute, stroke suspected. FINDINGS: BRAIN AND VENTRICLES: No acute infarct. No intracranial hemorrhage. No mass. No midline shift. No hydrocephalus. Advanced encephalomalacia is present throughout the right MCA territory with ex vacuo dilatation of the right lateral ventricle. There is extensive gliosis throughout the upper right hemisphere. Multifocal hyperintense T2-weighted signal is noted within the cerebral white matter, most commonly due to chronic small vessel disease. Wallerian degeneration of the right cerebral peduncle is present. Chronic blood products are seen in the lateral right hemisphere. The sella is unremarkable. Normal flow voids. ORBITS: No acute abnormality. SINUSES AND MASTOIDS: No acute abnormality. BONES AND SOFT TISSUES: Normal marrow signal. No acute soft tissue abnormality. IMPRESSION: 1. No acute intracranial abnormality. 2. Chronic right MCA territory encephalomalacia  with ex vacuo dilatation of the right lateral ventricle, extensive right hemispheric gliosis, and Wallerian degeneration of the right cerebral peduncle. 3. Multifocal T2 hyperintense signal within the cerebral white matter, most commonly due to chronic small vessel disease. Electronically signed by: Franky Stanford MD 09/24/2024 03:46 AM EST RP Workstation: [id number]   ECHOCARDIOGRAM COMPLETE Result Date: 09/23/2024    ECHOCARDIOGRAM REPORT   Patient Name:   Rodney Estrada Date of Exam: 09/23/2024 Medical Rec #:  [id number]    Height:       69.0 in Accession #:    [id number]   Weight:        169.0 lb Date of Birth:  15-Jul-1951     BSA:          1.923 m Patient Age:    73 years     BP:           88/53 mmHg Patient Gender: M            HR:           60 bpm. Exam Location:  [facility] Procedure: 2D Echo, Cardiac Doppler and Color Doppler (Both Spectral and Color            Flow Doppler were utilized during procedure). Indications:     Elevated troponin  History:         Patient has prior history of Echocardiogram examinations, most                  recent 04/06/2020. Risk Factors:Hypertension. Cocaine use.  Sonographer:     Christopher Furnace Referring Phys:  [id number] JAN A MANSY Diagnosing Phys: Lonni End MD  Sonographer Comments: Technically difficult study due to poor echo windows and no apical window. Image acquisition challenging due to respiratory motion and All views obtainable were from modified views. IMPRESSIONS  1. Left ventricular ejection fraction, by estimation, is >55%. The left ventricle has normal function. Left ventricular endocardial border not optimally defined to evaluate regional wall motion. There is moderate left ventricular hypertrophy. Left ventricular diastolic function could not be evaluated.  2. Right ventricular systolic function is normal. The right ventricular size is normal. Mildly increased right ventricular wall thickness.  3. The mitral valve was not well visualized. No evidence of mitral valve regurgitation.  4. Tricuspid valve regurgitation could not be accurately assessed.  5. The aortic valve has an indeterminant number of cusps. There is mild calcification of the aortic valve. There is mild thickening of the aortic valve. Aortic valve regurgitation is not visualized. Aortic valve gradient could not be assessed.  6. Pulmonic valve regurgitation could not be accurately assessed.  7. The inferior vena cava is normal in size with greater than 50% respiratory variability, suggesting right atrial pressure of 3 mmHg. FINDINGS  Left Ventricle: Left ventricular ejection fraction, by  estimation, is >55%. The left ventricle has normal function. Left ventricular endocardial border not optimally defined to evaluate regional wall motion. The left ventricular internal cavity size was  normal in size. There is moderate left ventricular hypertrophy. Left ventricular diastolic function could not be evaluated. Right Ventricle: The right ventricular size is normal. Mildly increased right ventricular wall thickness. Right ventricular systolic function is normal. Left Atrium: Left atrial size was not well visualized. Right Atrium: Right atrial size was not well visualized. Pericardium: There is no evidence of pericardial effusion. Mitral Valve: The mitral valve was not well visualized. Mild mitral annular calcification. No evidence of mitral valve regurgitation. Tricuspid Valve: The tricuspid  valve is not well visualized. Tricuspid valve regurgitation could not be accurately assessed. Aortic Valve: The aortic valve has an indeterminant number of cusps. There is mild calcification of the aortic valve. There is mild thickening of the aortic valve. Aortic valve regurgitation is not visualized. Aortic valve gradient could not be assessed. Pulmonic Valve: The pulmonic valve was not well visualized. Pulmonic valve regurgitation could not be accurately assessed. Aorta: The aortic root is normal in size and structure. Pulmonary Artery: The pulmonary artery is not well seen. Venous: The inferior vena cava is normal in size with greater than 50% respiratory variability, suggesting right atrial pressure of 3 mmHg. IAS/Shunts: No atrial level shunt detected by color flow Doppler.  LEFT VENTRICLE PLAX 2D LVIDd:         4.50 cm LVIDs:         3.30 cm LV PW:         1.30 cm LV IVS:        1.40 cm LVOT diam:     2.00 cm LVOT Area:     3.14 cm  LEFT ATRIUM         Index LA diam:    4.20 cm 2.18 cm/m   AORTA Ao Root diam: 3.30 cm  SHUNTS Systemic Diam: 2.00 cm Lonni Hanson MD Electronically signed by Lonni Hanson MD  Signature Date/Time: 09/23/2024/7:12:34 PM    Final    DG Abd 1 View Result Date: 09/23/2024 CLINICAL DATA:  Urinary tract infection.  Altered mental status. EXAM: ABDOMEN - 1 VIEW COMPARISON:  Radiograph 08/01/2022 FINDINGS: Possible right femoral catheter with tip terminating in the region of the right iliac vessels, alternatively this may be external to the patient. Slight gaseous distention of colon and to a lesser extent small bowel in the left abdomen. Vascular calcifications as well as calcifications projecting over the left renal shadow, renal stones versus renal vascular calcifications. No evidence of free air on supine views. IMPRESSION: 1. Possible right femoral catheter with tip terminating in the region of the right iliac vessels, alternatively this may be external to the patient. 2. Slight gaseous distention of colon and to a lesser extent small bowel in the left abdomen, may represent ileus. 3. Possible left-sided urolithiasis. Electronically Signed   By: Andrea Gasman M.D.   On: 09/23/2024 16:02   CT Head Wo Contrast Result Date: 09/22/2024 EXAM: CT HEAD WITHOUT CONTRAST 09/22/2024 06:47:02 PM TECHNIQUE: CT of the head was performed without the administration of intravenous contrast. Automated exposure control, iterative reconstruction, and/or weight based adjustment of the mA/kV was utilized to reduce the radiation dose to as low as reasonably achievable. COMPARISON: None available. CLINICAL HISTORY: Mental status change, unknown cause FINDINGS: BRAIN AND VENTRICLES: No acute hemorrhage. Large remote right MCA territory infarct. Vascular stent within the right MCA M2 segment. Mild periventricular white matter changes likely related to small vessel ischemia. Mild superimposed global parenchymal atrophy. Ex vacuo dilatation of right lateral ventricle. No extra-axial collection. No mass effect or midline shift. Calcific atherosclerosis. ORBITS: No acute abnormality. SINUSES: Right maxillary  sinus mucosal thickening. SOFT TISSUES AND SKULL: No acute soft tissue abnormality. No skull fracture. IMPRESSION: 1. Large remote right MCA territory infarct with ex vacuo dilatation of the right lateral ventricle and a vascular stent in the right MCA M2 segment. 2. Mild chronic microvascular ischemic white matter changes. 3. Mild global cerebral parenchymal atrophy. 4. Calcific atherosclerosis. 5. Right maxillary sinus mucosal thickening. Electronically signed by: Dorethia Molt MD 09/22/2024 08:07 PM  EST RP Workstation: [id number]   DG Chest Port 1 View Result Date: 09/22/2024 EXAM: 1 VIEW(S) XRAY OF THE CHEST 09/22/2024 05:45:00 PM COMPARISON: None available. CLINICAL HISTORY: Hypoxia FINDINGS: LUNGS AND PLEURA: Low lung volumes. No focal pulmonary opacity. No pleural effusion. No pneumothorax. HEART AND MEDIASTINUM: Aortic calcification. No acute abnormality of the cardiac and mediastinal silhouettes. BONES AND SOFT TISSUES: No acute osseous abnormality. UPPER ABDOMEN: Gastric gaseous distention. IMPRESSION: 1. No acute cardiopulmonary abnormality. 2. Low lung volumes. 3. Aortic calcification. Electronically signed by: Dorethia Molt MD 09/22/2024 08:05 PM EST RP Workstation: [id number]    ECHO as above  TELEMETRY (personally reviewed): NSR rate 60s  EKG (personally reviewed): NSR rate 62 bpm, no acute ischemic changes  Data reviewed by me 09/24/2024: last 24h vitals tele labs imaging I/O ED provider note, admission H&P, hospitalist progress note  Principal Problem:   Acute metabolic encephalopathy Active Problems:   Essential hypertension   Acute lower UTI   Acute kidney injury superimposed on chronic kidney disease   Type 2 diabetes mellitus with peripheral neuropathy (HCC)   Seizure disorder (HCC)   GERD without esophagitis   Depression   Gout   Dyslipidemia   Hyperkalemia   Coronary artery disease   Elevated troponin    ASSESSMENT AND PLAN:  Margie Brink is a 73 y.o. male   with a past medical history of coronary artery disease by CT scan, chronic diastolic CHF, gout, hypertension, dyslipidemia, atrial fibrillation and hx CVA who presented to the ED on 09/22/2024 for altered mental status. Troponins mildly elevated and flat. Cardiology was consulted for further evaluation.   # AKI on CKD # UTI # Acute metabolic encephalopathy # Demand ischemia # Chronic HFpEF # CAD by CT scan Patient presented with AMS from facility. Cr up to 5.4 from baseline around 1.4-1.5. No complaints of CP. Troponins 187 > 154 > 147.  Echo this admission with a EF > 55%, no obvious wall motion abnormalities but limited study, normal RV size and function, no MR. -Holding heparin  given active bleeding from urethra. -Continue aspirin  81 mg daily, plavix  75 mg daily, and atorvastatin  40 mg daily.  -Can hold home labetolol given hypotension.  -Mild and flat troponins most consistent with demand/supply mismatch and not ACS in the setting of severe AKI.    Cardiology will sign off. Please haiku with questions or re-engage if needed. Follow up with Dr. Wilburn in 2-3 weeks.    This patient's plan of care was discussed and created with Dr. Wilburn and he is in agreement.  Signed: Danita Bloch, PA-C  09/24/2024, 2:19 PM [facility] Card[facility]      "

## 2024-09-24 NOTE — Plan of Care (Signed)

## 2024-09-25 ENCOUNTER — Inpatient Hospital Stay

## 2024-09-25 DIAGNOSIS — G9341 Metabolic encephalopathy: Secondary | ICD-10-CM | POA: Diagnosis not present

## 2024-09-25 LAB — URINALYSIS, COMPLETE (UACMP) WITH MICROSCOPIC
Bilirubin Urine: NEGATIVE
Glucose, UA: NEGATIVE mg/dL
Ketones, ur: 5 mg/dL — AB
Nitrite: NEGATIVE
Protein, ur: 30 mg/dL — AB
Specific Gravity, Urine: 1.012 (ref 1.005–1.030)
WBC, UA: >50 WBC/hpf (ref 0–5)
pH: 5 (ref 5.0–8.0)

## 2024-09-25 LAB — GLUCOSE, CAPILLARY
Glucose-Capillary: 102 mg/dL — ABNORMAL HIGH (ref 70–99)
Glucose-Capillary: 106 mg/dL — ABNORMAL HIGH (ref 70–99)
Glucose-Capillary: 59 mg/dL — ABNORMAL LOW (ref 70–99)
Glucose-Capillary: 66 mg/dL — ABNORMAL LOW (ref 70–99)
Glucose-Capillary: 68 mg/dL — ABNORMAL LOW (ref 70–99)
Glucose-Capillary: 77 mg/dL (ref 70–99)
Glucose-Capillary: 83 mg/dL (ref 70–99)
Glucose-Capillary: 94 mg/dL (ref 70–99)

## 2024-09-25 LAB — BASIC METABOLIC PANEL WITH GFR
Anion gap: 25 — ABNORMAL HIGH (ref 5–15)
Anion gap: 25 — ABNORMAL HIGH (ref 5–15)
BUN: 111 mg/dL — ABNORMAL HIGH (ref 8–23)
BUN: 105 mg/dL — ABNORMAL HIGH (ref 8–23)
CO2: 17 mmol/L — ABNORMAL LOW (ref 22–32)
CO2: 16 mmol/L — ABNORMAL LOW (ref 22–32)
Calcium: 7.8 mg/dL — ABNORMAL LOW (ref 8.9–10.3)
Calcium: 8.3 mg/dL — ABNORMAL LOW (ref 8.9–10.3)
Chloride: 100 mmol/L (ref 98–111)
Chloride: 101 mmol/L (ref 98–111)
Creatinine, Ser: 5.7 mg/dL — ABNORMAL HIGH (ref 0.61–1.24)
Creatinine, Ser: 5.47 mg/dL — ABNORMAL HIGH (ref 0.61–1.24)
GFR, Estimated: 10 mL/min — ABNORMAL LOW
GFR, Estimated: 10 mL/min — ABNORMAL LOW
Glucose, Bld: 62 mg/dL — ABNORMAL LOW (ref 70–99)
Glucose, Bld: 72 mg/dL (ref 70–99)
Potassium: 4.2 mmol/L (ref 3.5–5.1)
Potassium: 4.5 mmol/L (ref 3.5–5.1)
Sodium: 142 mmol/L (ref 135–145)
Sodium: 143 mmol/L (ref 135–145)

## 2024-09-25 LAB — MAGNESIUM: Magnesium: 2.3 mg/dL (ref 1.7–2.4)

## 2024-09-25 LAB — CULTURE, BLOOD (ROUTINE X 2)

## 2024-09-25 LAB — CBC
HCT: 31 % — ABNORMAL LOW (ref 39.0–52.0)
Hemoglobin: 9.9 g/dL — ABNORMAL LOW (ref 13.0–17.0)
MCH: 29.8 pg (ref 26.0–34.0)
MCHC: 31.9 g/dL (ref 30.0–36.0)
MCV: 93.4 fL (ref 80.0–100.0)
Platelets: 129 K/uL — ABNORMAL LOW (ref 150–400)
RBC: 3.32 MIL/uL — ABNORMAL LOW (ref 4.22–5.81)
RDW: 13 % (ref 11.5–15.5)
WBC: 8.6 K/uL (ref 4.0–10.5)
nRBC: 0 % (ref 0.0–0.2)

## 2024-09-25 LAB — MRSA NEXT GEN BY PCR, NASAL: MRSA by PCR Next Gen: DETECTED — AB

## 2024-09-25 LAB — PHOSPHORUS: Phosphorus: 5.9 mg/dL — ABNORMAL HIGH (ref 2.5–4.6)

## 2024-09-25 LAB — CK: Total CK: 1504 U/L — ABNORMAL HIGH (ref 49–397)

## 2024-09-25 LAB — VANCOMYCIN, RANDOM: Vancomycin Rm: 33 ug/mL

## 2024-09-25 MED ORDER — SODIUM CHLORIDE 0.9 % IV SOLN
600.0000 mg | INTRAVENOUS | Status: DC
  Filled 2024-09-25: qty 12

## 2024-09-25 MED ORDER — CYANOCOBALAMIN 1000 MCG/ML IJ SOLN
1000.0000 ug | Freq: Every day | INTRAMUSCULAR | Status: AC
  Administered 2024-09-26 – 2024-10-01 (×6): 1000 ug via INTRAMUSCULAR
  Filled 2024-09-25 (×6): qty 1

## 2024-09-25 MED ORDER — MUPIROCIN 2 % EX OINT
1.0000 | TOPICAL_OINTMENT | Freq: Two times a day (BID) | CUTANEOUS | Status: AC
  Administered 2024-09-25 – 2024-09-29 (×10): 1 via NASAL
  Filled 2024-09-25 (×2): qty 22

## 2024-09-25 MED ORDER — LACTULOSE 10 GM/15ML PO SOLN: 20.0000 g | Freq: Two times a day (BID) | ORAL | Status: DC | PRN

## 2024-09-25 MED ORDER — CHLORHEXIDINE GLUCONATE CLOTH 2 % EX PADS
6.0000 | MEDICATED_PAD | Freq: Every day | CUTANEOUS | Status: DC
  Administered 2024-09-25: 6 via TOPICAL

## 2024-09-25 MED ORDER — OSMOLITE 1.5 CAL PO LIQD
1000.0000 mL | ORAL | Status: DC
  Administered 2024-09-25 – 2024-10-01 (×8): 1000 mL

## 2024-09-25 MED ORDER — ARIPIPRAZOLE 2 MG PO TABS
2.0000 mg | ORAL_TABLET | Freq: Every day | ORAL | Status: DC
  Administered 2024-09-26 – 2024-10-01 (×6): 2 mg
  Filled 2024-09-25 (×7): qty 1

## 2024-09-25 MED ORDER — ORAL CARE MOUTH RINSE
15.0000 mL | OROMUCOSAL | Status: DC
  Administered 2024-09-25 – 2024-10-02 (×29): 15 mL via OROMUCOSAL

## 2024-09-25 MED ORDER — ASPIRIN 81 MG PO CHEW
81.0000 mg | CHEWABLE_TABLET | Freq: Every day | ORAL | Status: DC
  Administered 2024-09-26 – 2024-10-02 (×7): 81 mg
  Filled 2024-09-25 (×7): qty 1

## 2024-09-25 MED ORDER — LACTATED RINGERS IV BOLUS
500.0000 mL | Freq: Once | INTRAVENOUS | Status: AC
  Administered 2024-09-25: 500 mL via INTRAVENOUS

## 2024-09-25 MED ORDER — ADULT MULTIVITAMIN W/MINERALS CH
1.0000 | ORAL_TABLET | Freq: Every day | ORAL | Status: DC
  Administered 2024-09-25 – 2024-10-02 (×8): 1
  Filled 2024-09-25 (×8): qty 1

## 2024-09-25 MED ORDER — SENNOSIDES-DOCUSATE SODIUM 8.6-50 MG PO TABS
2.0000 | ORAL_TABLET | Freq: Every day | ORAL | Status: DC
  Administered 2024-09-27: 2
  Filled 2024-09-25 (×2): qty 2

## 2024-09-25 MED ORDER — DEXTROSE 50 % IV SOLN
12.5000 g | INTRAVENOUS | Status: AC
  Administered 2024-09-25: 12.5 g via INTRAVENOUS
  Filled 2024-09-25: qty 50

## 2024-09-25 MED ORDER — FREE WATER
30.0000 mL | Status: DC
  Administered 2024-09-25 – 2024-09-27 (×12): 30 mL

## 2024-09-25 MED ORDER — CYANOCOBALAMIN 1000 MCG/ML IJ SOLN
1000.0000 ug | Freq: Every day | INTRAMUSCULAR | Status: DC
  Administered 2024-09-25: 1000 ug via INTRAMUSCULAR
  Filled 2024-09-25: qty 1

## 2024-09-25 MED ORDER — SERTRALINE HCL 50 MG PO TABS
175.0000 mg | ORAL_TABLET | Freq: Every day | ORAL | Status: DC
  Administered 2024-09-26 – 2024-10-02 (×7): 175 mg
  Filled 2024-09-25 (×7): qty 4

## 2024-09-25 MED ORDER — SODIUM CHLORIDE 0.9 % IV SOLN
8.0000 mg/kg | INTRAVENOUS | Status: DC
  Filled 2024-09-25: qty 12

## 2024-09-25 MED ORDER — ONDANSETRON HCL 4 MG PO TABS: 4.0000 mg | ORAL_TABLET | Freq: Four times a day (QID) | ORAL | Status: DC | PRN

## 2024-09-25 MED ORDER — PROSOURCE TF20 ENFIT COMPATIBL EN LIQD
60.0000 mL | Freq: Every day | ENTERAL | Status: DC
  Administered 2024-09-25 – 2024-10-02 (×8): 60 mL
  Filled 2024-09-25 (×7): qty 60

## 2024-09-25 MED ORDER — FOLIC ACID 1 MG PO TABS
1.0000 mg | ORAL_TABLET | Freq: Every day | ORAL | Status: DC
  Administered 2024-09-25 – 2024-10-02 (×8): 1 mg
  Filled 2024-09-25 (×8): qty 1

## 2024-09-25 MED ORDER — THIAMINE MONONITRATE 100 MG PO TABS
100.0000 mg | ORAL_TABLET | Freq: Every day | ORAL | Status: AC
  Administered 2024-09-25 – 2024-10-01 (×7): 100 mg
  Filled 2024-09-25 (×7): qty 1

## 2024-09-25 MED ORDER — GUAIFENESIN 100 MG/5ML PO LIQD
10.0000 mL | ORAL | Status: DC
  Administered 2024-09-25 – 2024-10-02 (×39): 10 mL
  Filled 2024-09-25 (×40): qty 10

## 2024-09-25 MED ORDER — OMEPRAZOLE 2 MG/ML ORAL SUSPENSION
20.0000 mg | Freq: Two times a day (BID) | ORAL | Status: DC
  Administered 2024-09-25 – 2024-10-02 (×14): 20 mg
  Filled 2024-09-25 (×19): qty 10

## 2024-09-25 MED ORDER — VITAMIN B-12 1000 MCG PO TABS: 1000.0000 ug | ORAL_TABLET | Freq: Every day | ORAL | Status: DC

## 2024-09-25 MED ORDER — ACETAMINOPHEN 325 MG PO TABS: 650.0000 mg | ORAL_TABLET | Freq: Four times a day (QID) | ORAL | Status: DC | PRN

## 2024-09-25 MED ORDER — ONDANSETRON HCL 4 MG/2ML IJ SOLN: 4.0000 mg | Freq: Four times a day (QID) | INTRAMUSCULAR | Status: DC | PRN

## 2024-09-25 MED ORDER — CLOPIDOGREL BISULFATE 75 MG PO TABS
75.0000 mg | ORAL_TABLET | Freq: Every day | ORAL | Status: DC
  Administered 2024-09-26 – 2024-10-02 (×7): 75 mg
  Filled 2024-09-25 (×7): qty 1

## 2024-09-25 MED ORDER — ACETAMINOPHEN 650 MG RE SUPP: 650.0000 mg | Freq: Four times a day (QID) | RECTAL | Status: DC | PRN

## 2024-09-25 MED ORDER — VITAMIN B-12 1000 MCG PO TABS
1000.0000 ug | ORAL_TABLET | Freq: Every day | ORAL | Status: DC
  Administered 2024-10-02: 1000 ug
  Filled 2024-09-25: qty 1

## 2024-09-25 MED ORDER — ORAL CARE MOUTH RINSE: 15.0000 mL | OROMUCOSAL | Status: DC | PRN

## 2024-09-25 NOTE — Progress Notes (Addendum)
 Initial Nutrition Assessment  DOCUMENTATION CODES:  Not applicable  INTERVENTION:  Once NGT in place and confirmed, initiate tube feeding: Osmolite 1.5 at 55 ml/h (1320 ml per day) Start at 25 and advance by 10mL every 12 hours to reach goal Prosource TF20 60 ml 1x/d Free water : 30mL every 4 hours to maintain tube patency Provides 2060 kcal, 103 gm protein, 1006 ml free water  daily (TF+flush = free water ) Folic acid  1mg  daily for deficiency, MVI with minerals daily  NUTRITION DIAGNOSIS:   Inadequate oral intake related to inability to eat, lethargy/confusion as evidenced by NPO status.  GOAL:   Patient will meet greater than or equal to 90% of their needs  MONITOR:   TF tolerance, Labs, Weight trends, I & O's  REASON FOR ASSESSMENT:   Consult Enteral/tube feeding initiation and management  ASSESSMENT:  Pt with hx of PVD, CAD, CHF, gout, HLD, HTN, atrial fibrillation, prior CVA, hx ETOH abuse/tobacco abuse, CKD3a, and DM type 2 presented to ED from his facility with AMS and lethargy worsening over the last several days.Found to have a severe AKI and UTI.  12/29 - presented to ED 12/31 - moved to ICU for clinical decline  Pt in ICU at this time, continues with poor mentation. Nephrology following and note slight improvement in renal labs. No HD planned at this time.   MAP ranging 60-71 this shift, levophed ordered and being utilized prn. Currently off.   Pt too lethargic for medications and PO intake. Attending ordered NGT to be placed for enteral access. Noted hypoglycemia over the last 24 hours. Will enter TF recommendations to be utilized once tube is in place. Discussed with RN.   Pt with limited recent weight hx and unclear recent PO intake hx. Will advance TF slowly to monitor for signs of refeeding. Some micronutrients ordered earlier in admission, noted some replacements already ordered but will order folic acid  as it resulted low and is not currently being  replaced.   Admit weight: 81.1 kg   Current weight: 77 kg    Intake/Output Summary (Last 24 hours) at 09/25/2024 1424 Last data filed at 09/25/2024 0800 Gross per 24 hour  Intake 3538.66 ml  Output 1450 ml  Net 2088.66 ml  Net IO Since Admission: 2,614.84 mL [09/25/24 1424]  Drains/Lines: UOP x 24 hours  Average Meal Intake: 12/31: 25% intake x 2 recorded meals  Nutritionally Relevant Medications: Scheduled Meds:  cyanocobalamin  1,000 mcg Intramuscular Daily   levETIRAcetam   500 mg Oral Q24H   pantoprazole   40 mg Oral BID   senna-docusate  2 tablet Oral QHS   [START ON 09/29/2024] Vitamin D (Ergocalciferol)  50,000 Units Oral Q7 days   Continuous Infusions:  cefTRIAXone  (ROCEPHIN )  IV Stopped (09/24/24 1852)   [START ON 09/26/2024] DAPTOmycin     sodium bicarbonate  75 mEq in sodium chloride  0.45 % 1,075 mL infusion 125 mL/hr at 09/25/24 0800   PRN Meds: lactulose , ondansetron   Labs Reviewed: BUN 111, creatinine 5.7 Phosphorus 5.9 CBG ranges from 59-104 mg/dL over the last 24 hours HgbA1c 5.6%  Micronutrient Labs: Folate 4.1 (low) Vitamin D 37 (WNL) Vitamin B12 (218) Iron 21 (low)  NUTRITION - FOCUSED PHYSICAL EXAM: Defer to in-person assessment  Diet Order:   Diet Order             Diet NPO time specified  Diet effective now                   EDUCATION  NEEDS:  Not appropriate for education at this time  Skin:  Skin Assessment: Skin Integrity Issues: Stage 1: - right buttocks DTI: - left ankle - right ankle  Last BM:  12/31 - type 6  Height:  Ht Readings from Last 1 Encounters:  09/22/24 5' 9 (1.753 m)    Weight:  Wt Readings from Last 1 Encounters:  09/22/24 77 kg    Ideal Body Weight:  72.7 kg  BMI:  Body mass index is 25.07 kg/m.  Estimated Nutritional Needs:  Kcal:  1900-2100 kcal/d Protein:  90-110g/d Fluid:  >2L/d    Vernell Lukes, RD, LDN, CNSC Registered Dietitian II Please reach out via secure chat

## 2024-09-25 NOTE — Progress Notes (Signed)
 Pharmacy Antibiotic Note  Rodney Estrada is a 74 y.o. male admitted on 09/22/2024 with bacteremia.  Pharmacy has been consulted for vancomycin  dosing.  -Blood cx 12/29: 2 of 4 (anaerobic/aerobic different sets)- GPC: staph epi w/ mecA resistance  -also with possible UTI, on ceftriaxone   Assessment/Plan: ID consulted with plan to transition to daptomycin once vancomycin  trough < 20 Vancomycin  random level with AM labs Daptomycin 600 mg IV q 48 hours placeholder order for tomorrow pending vanc trough results   Height: 5' 9 (175.3 cm) Weight: 77 kg (169 lb 12.1 oz) IBW/kg (Calculated) : 70.7  Temp (24hrs), Avg:98 F (36.7 C), Min:97.3 F (36.3 C), Max:98.7 F (37.1 C)  Recent Labs  Lab 09/22/24 1758 09/23/24 0628 09/23/24 2345 09/24/24 0445 09/24/24 1820 09/24/24 2116 09/25/24 0310  WBC 11.4* 10.0  --  18.4*  --   --  8.6  CREATININE 5.40* 5.33*  --  6.31*  --   --  5.70*  LATICACIDVEN 1.0  --   --   --  0.8 0.9  --   VANCORANDOM  --   --  20  --   --   --  33    Estimated Creatinine Clearance: 11.7 mL/min (A) (by C-G formula based on SCr of 5.7 mg/dL (H)).    Allergies[1]  Antimicrobials this admission: Cefepime  x 1 12/29  Ceftriaxone  12/30 >>   Vancomycin   12/29(evening) >>    Dose adjustments this admission:    Microbiology results: Blood cx 12/29: 1 of 4 (anaerobic and aerobic different sets)- GPC: staph epi w/ mecA resistance    UCx:      Sputum:      MRSA PCR:    Thank you for allowing pharmacy to be a part of this patients care.  Glendia Lye, PharmD, BCPS 09/25/2024        [1] No Known Allergies

## 2024-09-25 NOTE — Progress Notes (Signed)
 [facility] [place], [place] 09/25/2024  Subjective:   Hospital day # 3  Patient remains critically ill.  Still somnolent and did not respond to verbal or tactile stimuli. Blood cultures are positive for Staph epidermidis. Pressors-norepinephrine     Objective:  Vital signs in last 24 hours:  Temp:  [97.3 F (36.3 C)-98.7 F (37.1 C)] 98.1 F (36.7 C) (01/01 0800) Pulse Rate:  [57-144] 69 (01/01 0900) Resp:  [13-26] 13 (01/01 0900) BP: (82-103)/(42-64) 103/46 (01/01 0900) SpO2:  [92 %-100 %] 98 % (01/01 0900)  Weight change:  Filed Weights   09/22/24 1725 09/22/24 2134  Weight: 81.1 kg 77 kg    Intake/Output:    Intake/Output Summary (Last 24 hours) at 09/25/2024 1129 Last data filed at 09/25/2024 0800 Gross per 24 hour  Intake 3538.66 ml  Output 1450 ml  Net 2088.66 ml     Physical Exam: General: Critically ill-appearing  HEENT [place] O2  Pulm/lungs Decreased breath sounds at bases  CVS/Heart Tachycardic  Abdomen:  Soft, nontender, nondistended  Extremities: Trace dependent edema  Neurologic: Somnolent  Skin: Warm, dry   External urinary catheter in place       Basic Metabolic Panel:  Recent Labs  Lab 09/22/24 1758 09/23/24 0628 09/24/24 0445 09/24/24 0955 09/25/24 0310  NA 141 142 140  --  142  K 5.2* 4.8 5.1  --  4.2  CL 104 103 101  --  100  CO2 17* 17* 13*  --  17*  GLUCOSE 124* 110* 126*  --  62*  BUN 93* 98* 115*  --  111*  CREATININE 5.40* 5.33* 6.31*  --  5.70*  CALCIUM  9.6 9.4 8.4*  --  7.8*  MG  --   --   --  2.6* 2.3  PHOS  --   --   --  8.3* 5.9*     CBC: Recent Labs  Lab 09/22/24 1758 09/23/24 0628 09/24/24 0445 09/25/24 0310  WBC 11.4* 10.0 18.4* 8.6  NEUTROABS 9.1*  --   --   --   HGB 11.1* 11.2* 9.9* 9.9*  HCT 34.9* 36.1* 31.7* 31.0*  MCV 94.8 95.8 95.5 93.4  PLT 194 169 163 129*     No results found for: HEPBSAG, HEPBSAB, HEPBIGM    Microbiology:  Recent Results (from the past 240  hours)  Culture, blood (routine x 2)     Status: Abnormal   Collection Time: 09/22/24  5:37 PM   Specimen: BLOOD LEFT HAND  Result Value Ref Range Status   Specimen Description   Final    BLOOD LEFT HAND Performed at [facility], [address] [place], [place] [address]    Special Requests   Final    BOTTLES DRAWN AEROBIC AND ANAEROBIC Blood Culture results may not be optimal due to an inadequate volume of blood received in culture bottles Performed at [facility], [address] [place], [place] [address]    Culture  Setup Time   Final    GRAM POSITIVE COCCI AEROBIC BOTTLE ONLY CRITICAL VALUE NOTED.  VALUE IS CONSISTENT WITH PREVIOUSLY REPORTED AND CALLED VALUE. Performed at [facility], [address] [place], [place] [address]    Culture STAPHYLOCOCCUS EPIDERMIDIS (A)  Final   Report Status 09/25/2024 FINAL  Final   Organism ID, Bacteria STAPHYLOCOCCUS EPIDERMIDIS  Final      Susceptibility   Staphylococcus epidermidis - MIC*    CIPROFLOXACIN 4 RESISTANT Resistant     ERYTHROMYCIN >=  8 RESISTANT Resistant     GENTAMICIN  >=16 RESISTANT Resistant     OXACILLIN >=4 RESISTANT Resistant     TETRACYCLINE <=1 SENSITIVE Sensitive     VANCOMYCIN  2 SENSITIVE Sensitive     TRIMETH/SULFA 160 RESISTANT Resistant     CLINDAMYCIN >=8 RESISTANT Resistant     RIFAMPIN <=0.5 SENSITIVE Sensitive     Inducible Clindamycin NEGATIVE Sensitive     * STAPHYLOCOCCUS EPIDERMIDIS  Culture, blood (routine x 2)     Status: Abnormal   Collection Time: 09/22/24  5:57 PM   Specimen: Right Antecubital; Blood  Result Value Ref Range Status   Specimen Description   Final    RIGHT ANTECUBITAL Performed at [facility], [address] [place], [place] [address]    Special Requests   Final    BOTTLES DRAWN AEROBIC AND ANAEROBIC Blood Culture results may not be optimal due to an inadequate volume of blood received in culture bottles Performed at [facility], [address] [place], [place] [address]    Culture  Setup Time   Final    GRAM POSITIVE COCCI ANAEROBIC BOTTLE ONLY CRITICAL RESULT CALLED TO, READ BACK BY AND VERIFIED WITH: EMILY STEINBOCK 09/23/24 1424 KLW    Culture STAPHYLOCOCCUS EPIDERMIDIS (A)  Final   Report Status 09/25/2024 FINAL  Final   Organism ID, Bacteria STAPHYLOCOCCUS EPIDERMIDIS  Final      Susceptibility   Staphylococcus epidermidis - MIC*    CIPROFLOXACIN <=0.5 SENSITIVE Sensitive     ERYTHROMYCIN >=8 RESISTANT Resistant     GENTAMICIN  >=16 RESISTANT Resistant     OXACILLIN >=4 RESISTANT Resistant     TETRACYCLINE <=1 SENSITIVE Sensitive     VANCOMYCIN  1 SENSITIVE Sensitive     TRIMETH/SULFA 160 RESISTANT Resistant     CLINDAMYCIN <=0.25 SENSITIVE Sensitive     RIFAMPIN <=0.5 SENSITIVE Sensitive     Inducible Clindamycin NEGATIVE Sensitive     * STAPHYLOCOCCUS EPIDERMIDIS  Blood Culture ID Panel (Reflexed)     Status: Abnormal   Collection Time: 09/22/24  5:57 PM  Result Value Ref Range Status   Enterococcus faecalis NOT DETECTED NOT DETECTED Final   Enterococcus Faecium NOT DETECTED NOT DETECTED Final   Listeria monocytogenes NOT DETECTED NOT DETECTED Final   Staphylococcus species DETECTED (A) NOT DETECTED Final    Comment: CRITICAL RESULT CALLED TO, READ BACK BY AND VERIFIED WITH: EMILY STEINBOCK 09/23/24 1424 KLW    Staphylococcus aureus (BCID) NOT DETECTED NOT DETECTED Final   Staphylococcus epidermidis DETECTED (A) NOT DETECTED Final    Comment: Methicillin (oxacillin) resistant coagulase negative staphylococcus. Possible blood culture contaminant (unless isolated from more than one blood culture draw or clinical case suggests pathogenicity). No antibiotic treatment is indicated for blood  culture contaminants. CRITICAL RESULT CALLED TO, READ BACK BY AND VERIFIED WITH: EMILY STEINBOCK 09/23/24 1424 KLW    Staphylococcus lugdunensis NOT DETECTED NOT DETECTED Final   Streptococcus  species NOT DETECTED NOT DETECTED Final   Streptococcus agalactiae NOT DETECTED NOT DETECTED Final   Streptococcus pneumoniae NOT DETECTED NOT DETECTED Final   Streptococcus pyogenes NOT DETECTED NOT DETECTED Final   A.calcoaceticus-baumannii NOT DETECTED NOT DETECTED Final   Bacteroides fragilis NOT DETECTED NOT DETECTED Final   Enterobacterales NOT DETECTED NOT DETECTED Final   Enterobacter cloacae complex NOT DETECTED NOT DETECTED Final   Escherichia coli NOT DETECTED NOT DETECTED Final   Klebsiella aerogenes NOT DETECTED NOT DETECTED Final   Klebsiella oxytoca NOT DETECTED NOT DETECTED Final  Klebsiella pneumoniae NOT DETECTED NOT DETECTED Final   Proteus species NOT DETECTED NOT DETECTED Final   Salmonella species NOT DETECTED NOT DETECTED Final   Serratia marcescens NOT DETECTED NOT DETECTED Final   Haemophilus influenzae NOT DETECTED NOT DETECTED Final   Neisseria meningitidis NOT DETECTED NOT DETECTED Final   Pseudomonas aeruginosa NOT DETECTED NOT DETECTED Final   Stenotrophomonas maltophilia NOT DETECTED NOT DETECTED Final   Candida albicans NOT DETECTED NOT DETECTED Final   Candida auris NOT DETECTED NOT DETECTED Final   Candida glabrata NOT DETECTED NOT DETECTED Final   Candida krusei NOT DETECTED NOT DETECTED Final   Candida parapsilosis NOT DETECTED NOT DETECTED Final   Candida tropicalis NOT DETECTED NOT DETECTED Final   Cryptococcus neoformans/gattii NOT DETECTED NOT DETECTED Final   Methicillin resistance mecA/C DETECTED (A) NOT DETECTED Final    Comment: CRITICAL RESULT CALLED TO, READ BACK BY AND VERIFIED WITH: EMILY STEINBOCK 09/23/24 1424 KLW Performed at [facility], [address] [place], [place] [address]   Resp panel by RT-PCR (RSV, Flu A&B, Covid) Anterior Nasal Swab     Status: None   Collection Time: 09/22/24  5:58 PM   Specimen: Anterior Nasal Swab  Result Value Ref Range Status   SARS Coronavirus 2 by RT PCR NEGATIVE NEGATIVE Final     Comment: (NOTE) SARS-CoV-2 target nucleic acids are NOT DETECTED.  The SARS-CoV-2 RNA is generally detectable in upper respiratory specimens during the acute phase of infection. The lowest concentration of SARS-CoV-2 viral copies this assay can detect is 138 copies/mL. A negative result does not preclude SARS-Cov-2 infection and should not be used as the sole basis for treatment or other patient management decisions. A negative result may occur with  improper specimen collection/handling, submission of specimen other than nasopharyngeal swab, presence of viral mutation(s) within the areas targeted by this assay, and inadequate number of viral copies(<138 copies/mL). A negative result must be combined with clinical observations, patient history, and epidemiological information. The expected result is Negative.  Fact Sheet for Patients:  bloggercourse.com  Fact Sheet for Healthcare Providers:  seriousbroker.it  This test is no t yet approved or cleared by the [place]  FDA and  has been authorized for detection and/or diagnosis of SARS-CoV-2 by FDA under an Emergency Use Authorization (EUA). This EUA will remain  in effect (meaning this test can be used) for the duration of the COVID-19 declaration under Section 564(b)(1) of the Act, 21 U.S.C.section 360bbb-3(b)(1), unless the authorization is terminated  or revoked sooner.       Influenza A by PCR NEGATIVE NEGATIVE Final   Influenza B by PCR NEGATIVE NEGATIVE Final    Comment: (NOTE) The Xpert Xpress SARS-CoV-2/FLU/RSV plus assay is intended as an aid in the diagnosis of influenza from Nasopharyngeal swab specimens and should not be used as a sole basis for treatment. Nasal washings and aspirates are unacceptable for Xpert Xpress SARS-CoV-2/FLU/RSV testing.  Fact Sheet for Patients: bloggercourse.com  Fact Sheet for Healthcare  Providers: seriousbroker.it  This test is not yet approved or cleared by the [place]  FDA and has been authorized for detection and/or diagnosis of SARS-CoV-2 by FDA under an Emergency Use Authorization (EUA). This EUA will remain in effect (meaning this test can be used) for the duration of the COVID-19 declaration under Section 564(b)(1) of the Act, 21 U.S.C. section 360bbb-3(b)(1), unless the authorization is terminated or revoked.     Resp Syncytial Virus by PCR NEGATIVE NEGATIVE Final    Comment: (  NOTE) Fact Sheet for Patients: bloggercourse.com  Fact Sheet for Healthcare Providers: seriousbroker.it  This test is not yet approved or cleared by the [place]  FDA and has been authorized for detection and/or diagnosis of SARS-CoV-2 by FDA under an Emergency Use Authorization (EUA). This EUA will remain in effect (meaning this test can be used) for the duration of the COVID-19 declaration under Section 564(b)(1) of the Act, 21 U.S.C. section 360bbb-3(b)(1), unless the authorization is terminated or revoked.  Performed at [facility], [address] [place], [place] [address]   Culture, blood (Routine X 2) w Reflex to ID Panel     Status: None (Preliminary result)   Collection Time: 09/24/24  6:18 PM   Specimen: BLOOD  Result Value Ref Range Status   Specimen Description BLOOD BLOOD RIGHT HAND  Final   Special Requests   Final    BOTTLES DRAWN AEROBIC AND ANAEROBIC Blood Culture adequate volume   Culture   Final    NO GROWTH < 12 HOURS Performed at [facility], [address] [place], [place] [address]    Report Status PENDING  Incomplete  MRSA Next Gen by PCR, Nasal     Status: Abnormal   Collection Time: 09/24/24  6:18 PM   Specimen: Nasal Mucosa; Nasal Swab  Result Value Ref Range Status   MRSA by PCR Next Gen DETECTED (A) NOT DETECTED Final    Comment: RESULT  CALLED TO, READ BACK BY AND VERIFIED WITH:  GEORGINA OROZCO AT 2345 09/24/24 JG (NOTE) The GeneXpert MRSA Assay (FDA approved for NASAL specimens only), is one component of a comprehensive MRSA colonization surveillance program. It is not intended to diagnose MRSA infection nor to guide or monitor treatment for MRSA infections. Test performance is not FDA approved in patients less than 75 years old. Performed at [facility], [address] [place], [place] [address]   Culture, blood (Routine X 2) w Reflex to ID Panel     Status: None (Preliminary result)   Collection Time: 09/24/24  9:16 PM   Specimen: BLOOD  Result Value Ref Range Status   Specimen Description BLOOD BLOOD RIGHT HAND  Final   Special Requests   Final    BOTTLES DRAWN AEROBIC AND ANAEROBIC Blood Culture results may not be optimal due to an inadequate volume of blood received in culture bottles   Culture   Final    NO GROWTH < 12 HOURS Performed at [facility], [address] [place], [place] [address]    Report Status PENDING  Incomplete    Coagulation Studies: No results for input(s): LABPROT, INR in the last 72 hours.  Urinalysis: Recent Labs    09/22/24 1758  COLORURINE AMBER*  LABSPEC 1.018  PHURINE 7.0  GLUCOSEU NEGATIVE  HGBUR LARGE*  BILIRUBINUR NEGATIVE  KETONESUR NEGATIVE  PROTEINUR 100*  NITRITE NEGATIVE  LEUKOCYTESUR MODERATE*      Imaging: US  RENAL Result Date: 09/25/2024 EXAM: US  Retroperitoneum Complete, Renal. 09/24/2024 11:41:07 AM TECHNIQUE: Real-time ultrasonography of the retroperitoneum renal was performed. COMPARISON: US  Renal 11/28/2022 CLINICAL HISTORY: Acute kidney injury. FINDINGS: FINDINGS: RIGHT KIDNEY/URETER: Right kidney measures 12.3 x 5.2 x 6.3 cm. Right renal volume is 212 cc. Normal cortical echogenicity. A 7 mm nonobstructing calculus is seen within the interpolar region of the right kidney. No hydronephrosis. No mass. LEFT KIDNEY/URETER: Left  kidney measures 12.7 x 5.3 x 5.2 cm. Volume of the left kidney is 1081 cc. Normal cortical echogenicity. Mild left perinephric fluid  is present measuring up to 5 x 6 mm in thickness. There is no hydronephrosis. 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 No intrarenal masses. BLADDER: Unremarkable appearance of the bladder. IMPRESSION: 1. No hydronephrosis. 2. 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 3. Mild left perinephric fluid and a 7 mm nonobstructing right renal interpolar calculus. Electronically signed by: Dorethia Molt MD 09/25/2024 01:17 AM EST RP Workstation: [id number]   MR BRAIN WO CONTRAST Result Date: 09/24/2024 EXAM: MRI BRAIN WITHOUT CONTRAST 09/23/2024 12:41:34 PM TECHNIQUE: Multiplanar multisequence MRI of the head/brain was performed without the administration of intravenous contrast. COMPARISON: MR Head 04/06/2020. CLINICAL HISTORY: Neuro deficit, acute, stroke suspected. FINDINGS: BRAIN AND VENTRICLES: No acute infarct. No intracranial hemorrhage. No mass. No midline shift. No hydrocephalus. Advanced encephalomalacia is present throughout the right MCA territory with ex vacuo dilatation of the right lateral ventricle. There is extensive gliosis throughout the upper right hemisphere. Multifocal hyperintense T2-weighted signal is noted within the cerebral white matter, most commonly due to chronic small vessel disease. Wallerian degeneration of the right cerebral peduncle is present. Chronic blood products are seen in the lateral right hemisphere. The sella is unremarkable. Normal flow voids. ORBITS: No acute abnormality. SINUSES AND MASTOIDS: No acute abnormality. BONES AND SOFT TISSUES: Normal marrow signal. No acute soft tissue abnormality. IMPRESSION: 1. No acute intracranial abnormality. 2. Chronic right MCA territory encephalomalacia with ex vacuo dilatation of  the right lateral ventricle, extensive right hemispheric gliosis, and Wallerian degeneration of the right cerebral peduncle. 3. Multifocal T2 hyperintense signal within the cerebral white matter, most commonly due to chronic small vessel disease. Electronically signed by: Franky Stanford MD 09/24/2024 03:46 AM EST RP Workstation: [id number]   ECHOCARDIOGRAM COMPLETE Result Date: 09/23/2024    ECHOCARDIOGRAM REPORT   Patient Name:   Rodney Estrada Date of Exam: 09/23/2024 Medical Rec #:  [id number]    Height:       69.0 in Accession #:    [id number]   Weight:       169.0 lb Date of Birth:  Dec 06, 1950     BSA:          1.923 m Patient Age:    73 years     BP:           88/53 mmHg Patient Gender: M            HR:           60 bpm. Exam Location:  [facility] Procedure: 2D Echo, Cardiac Doppler and Color Doppler (Both Spectral and Color            Flow Doppler were utilized during procedure). Indications:     Elevated troponin  History:         Patient has prior history of Echocardiogram examinations, most                  recent 04/06/2020. Risk Factors:Hypertension. Cocaine use.  Sonographer:     Christopher Furnace Referring Phys:  [id number] JAN A MANSY Diagnosing Phys: Lonni End MD  Sonographer Comments: Technically difficult study due to poor echo windows and no apical window. Image acquisition challenging due to respiratory motion and All views obtainable were from modified views. IMPRESSIONS  1. Left ventricular ejection fraction, by estimation, is >55%. The left ventricle has normal function. Left ventricular endocardial border not optimally defined to  evaluate regional wall motion. There is moderate left ventricular hypertrophy. Left ventricular diastolic function could not be evaluated.  2. Right ventricular systolic function is normal. The right ventricular size is normal. Mildly increased right ventricular wall thickness.  3. The mitral valve was not well visualized. No evidence of mitral valve regurgitation.  4. Tricuspid  valve regurgitation could not be accurately assessed.  5. The aortic valve has an indeterminant number of cusps. There is mild calcification of the aortic valve. There is mild thickening of the aortic valve. Aortic valve regurgitation is not visualized. Aortic valve gradient could not be assessed.  6. Pulmonic valve regurgitation could not be accurately assessed.  7. The inferior vena cava is normal in size with greater than 50% respiratory variability, suggesting right atrial pressure of 3 mmHg. FINDINGS  Left Ventricle: Left ventricular ejection fraction, by estimation, is >55%. The left ventricle has normal function. Left ventricular endocardial border not optimally defined to evaluate regional wall motion. The left ventricular internal cavity size was  normal in size. There is moderate left ventricular hypertrophy. Left ventricular diastolic function could not be evaluated. Right Ventricle: The right ventricular size is normal. Mildly increased right ventricular wall thickness. Right ventricular systolic function is normal. Left Atrium: Left atrial size was not well visualized. Right Atrium: Right atrial size was not well visualized. Pericardium: There is no evidence of pericardial effusion. Mitral Valve: The mitral valve was not well visualized. Mild mitral annular calcification. No evidence of mitral valve regurgitation. Tricuspid Valve: The tricuspid valve is not well visualized. Tricuspid valve regurgitation could not be accurately assessed. Aortic Valve: The aortic valve has an indeterminant number of cusps. There is mild calcification of the aortic valve. There is mild thickening of the aortic valve. Aortic valve regurgitation is not visualized. Aortic valve gradient could not be assessed. Pulmonic Valve: The pulmonic valve was not well visualized. Pulmonic valve regurgitation could not be accurately assessed. Aorta: The aortic root is normal in size and structure. Pulmonary Artery: The pulmonary artery is  not well seen. Venous: The inferior vena cava is normal in size with greater than 50% respiratory variability, suggesting right atrial pressure of 3 mmHg. IAS/Shunts: No atrial level shunt detected by color flow Doppler.  LEFT VENTRICLE PLAX 2D LVIDd:         4.50 cm LVIDs:         3.30 cm LV PW:         1.30 cm LV IVS:        1.40 cm LVOT diam:     2.00 cm LVOT Area:     3.14 cm  LEFT ATRIUM         Index LA diam:    4.20 cm 2.18 cm/m   AORTA Ao Root diam: 3.30 cm  SHUNTS Systemic Diam: 2.00 cm Lonni Hanson MD Electronically signed by Lonni Hanson MD Signature Date/Time: 09/23/2024/7:12:34 PM    Final      Medications:    sodium chloride  Stopped (09/24/24 1757)   cefTRIAXone  (ROCEPHIN )  IV Stopped (09/24/24 1852)   [START ON 09/26/2024] DAPTOmycin     norepinephrine (LEVOPHED) Adult infusion Stopped (09/24/24 1836)   sodium bicarbonate  75 mEq in sodium chloride  0.45 % 1,075 mL infusion 125 mL/hr at 09/25/24 0800    ARIPiprazole   2 mg Oral QHS   artificial tears  1 drop Both Eyes QHS   aspirin   81 mg Oral Daily   Chlorhexidine  Gluconate Cloth  6 each Topical Daily   Chlorhexidine   Gluconate Cloth  6 each Topical Daily   clopidogrel   75 mg Oral Daily   cyanocobalamin  1,000 mcg Intramuscular Daily   Followed by   NOREEN ON 10/02/2024] vitamin B-12  1,000 mcg Oral Daily   guaiFENesin   600 mg Oral BID   heparin  injection (subcutaneous)  5,000 Units Subcutaneous Q8H   levETIRAcetam   500 mg Oral Q24H   Or   levETIRAcetam   500 mg Intravenous Q24H   mupirocin  ointment  1 Application Nasal BID   oxybutynin   5 mg Oral QHS   pantoprazole   40 mg Oral BID   senna-docusate  2 tablet Oral QHS   sertraline   175 mg Oral Daily   [START ON 09/29/2024] Vitamin D (Ergocalciferol)  50,000 Units Oral Q7 days   acetaminophen  **OR** acetaminophen , camphor-menthol , ipratropium-albuterol , lactulose , ondansetron  **OR** ondansetron  (ZOFRAN ) IV  Assessment/ Plan:  74 y.o. male with with multiple medical  problems including history of stroke, peripheral vascular disease, hypertension, type 2 diabetes admitted on 09/22/2024 for Acute metabolic encephalopathy [G93.41]   Acute kidney injury Chronic kidney disease, stage IIIa.  Creatinine 1.54/GFR 49 from 08/04/2022 Diabetes type 2 with chronic kidney disease History of chronic left hydronephrosis  history of kidney stones and left ureteral stent placement and removal in February 2024 Acute metabolic encephalopathy Acute metabolic acidosis   Patient has severe acute kidney injury.  He has history of kidney stones, left hydronephrosis and left ureteral stent placement.  Renal ultrasound from 09/24/2024 is negative for hydronephrosis.  Nonobstructing calculi in both kidneys Urine output about 1650 cc.  Slight improvement in creatinine is noted.  No acute indication for dialysis at present.  Agree with pressors as needed.  Currently getting sodium bicarbonate  at 125 cc/h for acute metabolic acidosis.  Bicarb level has improved.  Diabetes control is fair.  Hemoglobin A1c 5.6% from 09/22/2024.  Management as as per primary team.  Urinary tract infection-currently on daptomycin and ceftriaxone .  Dosed as per pharmacy recommendations.  We will follow along and continue to monitor.    LOS: 3 Zikeria Keough Dennise 1/1/202611:29 AM  [facility] [place], [place] [phone]  Note: This note was prepared with Dragon dictation. Any transcription errors are unintentional

## 2024-09-25 NOTE — Plan of Care (Signed)

## 2024-09-25 NOTE — Progress Notes (Signed)
 [facility] Progress Note  Patient: Rodney Estrada    FMW:[id number]  DOA: 09/22/2024     Date of Service: the patient was seen and examined on 09/25/2024  No chief complaint on file.  Brief hospital course:  from H&P - Tavi Hoogendoorn is a 74 y.o. male with medical history significant for coronary artery disease, chronic systolic CHF, gout, hypertension, dyslipidemia, atrial fibrillation and CVA as well as type 2 diabetes mellitus who presented to the emergency room 12/29 with acute onset of altered mental status with confusion and decreased responsiveness since Friday 12/26 at his SNF.  He has been nonverbal with increased tremors.  No reported fever or chills.  SNF staff noted congested cough and therefore the patient was given IM Rocephin  this afternoon.  The patient was nonverbal during admitting hospitalist's interview and would answer some questions nodding his head.  No chest pain or palpitations were reported.  No reported nausea or vomiting or diarrhea or abdominal pain.     12/29: to ED. (+)UTI, started on IV fluids, vanc, cefepime . Admitted to hospitalist for acute encephalopathy d/t UTI, AKI on CKD 3a assoc w/ hyperkalemia. Concern w/ elevation troponins, heparin  gtt started.  12/30: holding heparin  gtt given hematuria. Cardiology following, in agreement. Echo pending. Pt remains confused. Echo pending.          Consultants:  Cardiology signed off on 12/31 Nephrology   Procedures/Surgeries: None   Assessment and Plan:  # Severe sepsis secondary to UTI Leukocytosis, tachycardia, tachypneic and hypotensive on 12/31 Patient responded well to IV fluid, did not require Levophed Transferred to ICU, lactic acid within normal range and vitals improved after IV fluid so transferred to progressive unit.  cortisol level wnl S/p vancomycin , trough level was high, DC'd Continue ceftriaxone   1/1 start daptomycin when vanc trough level <15 as per ID Pharmacy consulted for  dosing 12/31 NS 500 mL bolus and LR 500 bolus, 1/1 LR 500 mL bolus PCCM consulted, LR 500 bolus ordered, if no improvement then patient will need Levophed.    Acute metabolic encephalopathy secondary to uremia MRI brain pending radiology read - appears stable from previous on personal review  Continue neurochecks  keppra  levels elevated, pharmacy consulted for dose and monitor Hold gabapentin  (300 mg tid home dose)   # AKI on CKD stage III # Metabolic encephalopathy secondary to uremia # Metabolic acidosis History of chronic left hydronephrosis  history of kidney stones and left ureteral stent placement and removal in February 2024 Bicarbonate 100 mEq IV one-time push given Started bicarb IV infusion Nephrology consulted for urgent hemodialysis Palliative care consulted, as per nephro patient may not be able to get hemodialysis done as an outpatient due to comorbidities.   Acute lower UTI IV Rocephin   1/1 repeated UA and urine cultures pending     Staph epidermidis blood culture positive Possible contamination S/p vancomycin , DC'd due to renal failure D/w ID recommended to start daptomycin when Vanco trough <15   Elevated troponin / Type II  likely demand ischemia  S/p IV heparin  dc TTE LVEF >55, Left ventricular endocardial border not optimally defined to evaluate regional wall motion, moderate LVH. Cardiology consulted recommended no intervention.  Signed off on 12/31     Gross hematuria: Resolved  Question due to UTI Monitor CBC   Hyperkalemia D/t AKI Treat underlying cause(s) as noted  S/p lokelma , repeat as needed Follow BMP   Essential hypertension but low BP due to sepsis Hold amlodipine , lisinopril     Coronary artery  disease HOD ASA, Plavix , statin Hold antiypertensives    Gout Hold allopurinol  w/ AKI   Depression Continue Zoloft  home dose 175 mg daily --> 150  Abilify     GERD without esophagitis PPI   Seizure disorder continue  Keppra  Check keppra  levels    Constipation Bowel regimen per orders   Type 2 diabetes mellitus with peripheral neuropathy  Not on home antihyperglycemics No glucosuria Holding SSI Monitor Glc achs for now and initiate insulin  if needed  A1C hold Neurontin  w/ AKI and encephalopathy   Prednisone rx outpatient started 12/26 20 mg daily NOT chronic medication Uncertain why but suspect as above he was treated for presumed respiratory illness at faciltiy - will dc this  cortisol level wnl    Overall condition prognosis is very poor, patient may not survive. Critical care consulted, we will continue above management. Palliative care consulted for goals of care discussion.   Body mass index is 25.07 kg/m.  Interventions:  Diet: NPO DVT Prophylaxis: Subcutaneous Heparin     Advance goals of care discussion: DNR-limited  Family Communication: family was not present at bedside, at the time of interview.  The pt provided permission to discuss medical plan with the family. Opportunity was given to ask question and all questions were answered satisfactorily.  12/31 called and left a VM for his sister [name] [phone]  09/25/24 I spoke to patient's sister today and updated the management plan and his current situation, poor prognosis.  Disposition:  Pt is from SNF, admitted with sepsis, AMS, AKI, uremia, still critically ill, which precludes a safe discharge. Discharge to SNF, when stable, may need few days to improve.  Subjective: No significant events overnight.  Patient has remained obtunded, able to answer yes to everything and unable to follow any commands.  Did not open his eyes, trying to move his right hand, has left-sided paralysis.   Physical Exam: General: NAD, lying comfortably Eyes: clossed ENT: Oral Mucosa Clear, but dry Neck: no JVD,  Cardiovascular: S1 and S2 Present, no Murmur,  Respiratory: good respiratory effort, Bilateral Air entry equal  and Decreased, no Crackles, no wheezes Abdomen: Bowel Sound present, Soft and no tenderness,  Skin: no rashes Extremities: no Pedal edema, no calf tenderness Neurologic: Left-sided residual weakness due to prior CVA.  Patient is obtunded, unable to follow command   Vitals:   09/25/24 1100 09/25/24 1200 09/25/24 1300 09/25/24 1400  BP: (!) 94/48 (!) 107/54 (!) 104/53 (!) 106/50  Pulse: 67 80 64 69  Resp: 14 20 11 13   Temp:      TempSrc:      SpO2: 98% 94% 96% 96%  Weight:      Height:        Intake/Output Summary (Last 24 hours) at 09/25/2024 1430 Last data filed at 09/25/2024 0800 Gross per 24 hour  Intake 3538.66 ml  Output 1450 ml  Net 2088.66 ml   Filed Weights   09/22/24 1725 09/22/24 2134  Weight: 81.1 kg 77 kg    Data Reviewed: I have personally reviewed and interpreted daily labs, tele strips, imagings as discussed above. I reviewed all nursing notes, pharmacy notes, vitals, pertinent old records I have discussed plan of care as described above with RN and patient/family.  CBC: Recent Labs  Lab 09/22/24 1758 09/23/24 0628 09/24/24 0445 09/25/24 0310  WBC 11.4* 10.0 18.4* 8.6  NEUTROABS 9.1*  --   --   --   HGB 11.1* 11.2* 9.9* 9.9*  HCT 34.9* 36.1* 31.7* 31.0*  MCV 94.8 95.8 95.5 93.4  PLT 194 169 163 129*   Basic Metabolic Panel: Recent Labs  Lab 09/22/24 1758 09/23/24 0628 09/24/24 0445 09/24/24 0955 09/25/24 0310  NA 141 142 140  --  142  K 5.2* 4.8 5.1  --  4.2  CL 104 103 101  --  100  CO2 17* 17* 13*  --  17*  GLUCOSE 124* 110* 126*  --  62*  BUN 93* 98* 115*  --  111*  CREATININE 5.40* 5.33* 6.31*  --  5.70*  CALCIUM  9.6 9.4 8.4*  --  7.8*  MG  --   --   --  2.6* 2.3  PHOS  --   --   --  8.3* 5.9*    Studies: No results found.  Scheduled Meds:  ARIPiprazole   2 mg Oral QHS   artificial tears  1 drop Both Eyes QHS   aspirin   81 mg Oral Daily   Chlorhexidine  Gluconate Cloth  6 each Topical Daily   Chlorhexidine  Gluconate Cloth  6  each Topical Daily   clopidogrel   75 mg Oral Daily   cyanocobalamin  1,000 mcg Intramuscular Daily   Followed by   NOREEN ON 10/02/2024] vitamin B-12  1,000 mcg Oral Daily   feeding supplement (PROSource TF20)  60 mL Per Tube Daily   folic acid   1 mg Per Tube Daily   free water   30 mL Per Tube Q4H   guaiFENesin   600 mg Oral BID   heparin  injection (subcutaneous)  5,000 Units Subcutaneous Q8H   levETIRAcetam   500 mg Oral Q24H   Or   levETIRAcetam   500 mg Intravenous Q24H   multivitamin with minerals  1 tablet Per Tube Daily   mupirocin  ointment  1 Application Nasal BID   oxybutynin   5 mg Oral QHS   pantoprazole   40 mg Oral BID   senna-docusate  2 tablet Oral QHS   sertraline   175 mg Oral Daily   thiamine   100 mg Per Tube Daily   [START ON 09/29/2024] Vitamin D (Ergocalciferol)  50,000 Units Oral Q7 days   Continuous Infusions:  sodium chloride  Stopped (09/24/24 1757)   cefTRIAXone  (ROCEPHIN )  IV Stopped (09/24/24 1852)   [START ON 09/26/2024] DAPTOmycin     feeding supplement (OSMOLITE 1.5 CAL)     norepinephrine (LEVOPHED) Adult infusion Stopped (09/24/24 1836)   sodium bicarbonate  75 mEq in sodium chloride  0.45 % 1,075 mL infusion 125 mL/hr at 09/25/24 0800   PRN Meds: acetaminophen  **OR** acetaminophen , camphor-menthol , ipratropium-albuterol , lactulose , ondansetron  **OR** ondansetron  (ZOFRAN ) IV  Time spent: 55 minutes  Author: ELVAN SOR. MD [facility] 09/25/2024 2:30 PM  To reach On-call, see care teams to locate the attending and reach out to them via [email or web address]. If 7PM-7AM, please contact night-coverage If you still have difficulty reaching the attending provider, please page the [facility] (Director on Call) for [facility] on amion for assistance.

## 2024-09-26 ENCOUNTER — Inpatient Hospital Stay

## 2024-09-26 DIAGNOSIS — B957 Other staphylococcus as the cause of diseases classified elsewhere: Secondary | ICD-10-CM | POA: Diagnosis not present

## 2024-09-26 DIAGNOSIS — R7881 Bacteremia: Secondary | ICD-10-CM | POA: Diagnosis not present

## 2024-09-26 DIAGNOSIS — N12 Tubulo-interstitial nephritis, not specified as acute or chronic: Secondary | ICD-10-CM | POA: Diagnosis not present

## 2024-09-26 DIAGNOSIS — N189 Chronic kidney disease, unspecified: Secondary | ICD-10-CM | POA: Diagnosis not present

## 2024-09-26 DIAGNOSIS — Z515 Encounter for palliative care: Secondary | ICD-10-CM

## 2024-09-26 DIAGNOSIS — J9601 Acute respiratory failure with hypoxia: Secondary | ICD-10-CM | POA: Diagnosis not present

## 2024-09-26 DIAGNOSIS — E440 Moderate protein-calorie malnutrition: Secondary | ICD-10-CM | POA: Insufficient documentation

## 2024-09-26 DIAGNOSIS — G9341 Metabolic encephalopathy: Secondary | ICD-10-CM | POA: Diagnosis not present

## 2024-09-26 DIAGNOSIS — N179 Acute kidney failure, unspecified: Secondary | ICD-10-CM | POA: Diagnosis not present

## 2024-09-26 DIAGNOSIS — N390 Urinary tract infection, site not specified: Secondary | ICD-10-CM | POA: Diagnosis not present

## 2024-09-26 DIAGNOSIS — G9349 Other encephalopathy: Secondary | ICD-10-CM | POA: Diagnosis not present

## 2024-09-26 DIAGNOSIS — N19 Unspecified kidney failure: Secondary | ICD-10-CM

## 2024-09-26 DIAGNOSIS — Z7189 Other specified counseling: Secondary | ICD-10-CM | POA: Diagnosis not present

## 2024-09-26 LAB — BASIC METABOLIC PANEL WITH GFR
Anion gap: 23 — ABNORMAL HIGH (ref 5–15)
BUN: 102 mg/dL — ABNORMAL HIGH (ref 8–23)
CO2: 20 mmol/L — ABNORMAL LOW (ref 22–32)
Calcium: 8 mg/dL — ABNORMAL LOW (ref 8.9–10.3)
Chloride: 102 mmol/L (ref 98–111)
Creatinine, Ser: 5.04 mg/dL — ABNORMAL HIGH (ref 0.61–1.24)
GFR, Estimated: 11 mL/min — ABNORMAL LOW
Glucose, Bld: 108 mg/dL — ABNORMAL HIGH (ref 70–99)
Potassium: 3.9 mmol/L (ref 3.5–5.1)
Sodium: 145 mmol/L (ref 135–145)

## 2024-09-26 LAB — CBC
HCT: 30 % — ABNORMAL LOW (ref 39.0–52.0)
Hemoglobin: 9.8 g/dL — ABNORMAL LOW (ref 13.0–17.0)
MCH: 30.2 pg (ref 26.0–34.0)
MCHC: 32.7 g/dL (ref 30.0–36.0)
MCV: 92.6 fL (ref 80.0–100.0)
Platelets: 122 K/uL — ABNORMAL LOW (ref 150–400)
RBC: 3.24 MIL/uL — ABNORMAL LOW (ref 4.22–5.81)
RDW: 12.9 % (ref 11.5–15.5)
WBC: 5.9 K/uL (ref 4.0–10.5)
nRBC: 0 % (ref 0.0–0.2)

## 2024-09-26 LAB — URINE CULTURE: Culture: NO GROWTH

## 2024-09-26 LAB — CK: Total CK: 668 U/L — ABNORMAL HIGH (ref 49–397)

## 2024-09-26 LAB — C-REACTIVE PROTEIN: CRP: 8.6 mg/dL — ABNORMAL HIGH

## 2024-09-26 LAB — MAGNESIUM: Magnesium: 2.3 mg/dL (ref 1.7–2.4)

## 2024-09-26 LAB — GLUCOSE, CAPILLARY
Glucose-Capillary: 113 mg/dL — ABNORMAL HIGH (ref 70–99)
Glucose-Capillary: 142 mg/dL — ABNORMAL HIGH (ref 70–99)
Glucose-Capillary: 174 mg/dL — ABNORMAL HIGH (ref 70–99)
Glucose-Capillary: 150 mg/dL — ABNORMAL HIGH (ref 70–99)
Glucose-Capillary: 177 mg/dL — ABNORMAL HIGH (ref 70–99)

## 2024-09-26 LAB — PHOSPHORUS: Phosphorus: 4.6 mg/dL (ref 2.5–4.6)

## 2024-09-26 LAB — VANCOMYCIN, RANDOM: Vancomycin Rm: 28 ug/mL

## 2024-09-26 MED ORDER — SODIUM CHLORIDE 0.9 % IV SOLN: 600.0000 mg | INTRAVENOUS | Status: DC

## 2024-09-26 MED ORDER — SODIUM CHLORIDE 0.9 % IV SOLN
500.0000 mg | INTRAVENOUS | Status: DC
  Administered 2024-09-26 – 2024-09-29 (×4): 500 mg via INTRAVENOUS
  Filled 2024-09-26 (×5): qty 500

## 2024-09-26 MED ORDER — VITAMIN C 500 MG PO TABS
500.0000 mg | ORAL_TABLET | Freq: Two times a day (BID) | ORAL | Status: DC
  Administered 2024-09-26 – 2024-10-02 (×13): 500 mg
  Filled 2024-09-26 (×13): qty 1

## 2024-09-26 MED ORDER — ZINC SULFATE 220 (50 ZN) MG PO CAPS
220.0000 mg | ORAL_CAPSULE | Freq: Every day | ORAL | Status: DC
  Administered 2024-09-26 – 2024-10-02 (×7): 220 mg
  Filled 2024-09-26 (×7): qty 1

## 2024-09-26 NOTE — Progress Notes (Signed)
 [facility] [place], [place] 09/26/2024  Subjective:   Hospital day # 4  Patient remains somnolent Able to raise left, not purposeful NGT in right nare, tube feeds at 76ml/hr  Blood cultures are positive for Staph epidermidis.  Creatinine 5.04 Urine output  Objective:  Vital signs in last 24 hours:  Temp:  [98 F (36.7 C)-99.5 F (37.5 C)] 98.3 F (36.8 C) (01/02 1217) Pulse Rate:  [66-77] 70 (01/02 1217) Resp:  [16-20] 16 (01/02 1217) BP: (99-131)/(47-82) 131/82 (01/02 1217) SpO2:  [95 %-98 %] 98 % (01/02 1217) Weight:  [80.6 kg] 80.6 kg (01/02 0458)  Weight change:  Filed Weights   09/22/24 1725 09/22/24 2134 09/26/24 0458  Weight: 81.1 kg 77 kg 80.6 kg    Intake/Output:    Intake/Output Summary (Last 24 hours) at 09/26/2024 1529 Last data filed at 09/26/2024 1200 Gross per 24 hour  Intake 3351.83 ml  Output 1850 ml  Net 1501.83 ml     Physical Exam: General: Critically ill-appearing  HEENT [employer] O2, NGT  Pulm/lungs Decreased breath sounds at bases  CVS/Heart Tachycardic  Abdomen:  Soft, nontender, nondistended  Extremities: Trace dependent edema  Neurologic: Somnolent  Skin: Warm, dry   External urinary catheter in place       Basic Metabolic Panel:  Recent Labs  Lab 09/23/24 0628 09/24/24 0445 09/24/24 0955 09/25/24 0310 09/25/24 1411 09/26/24 0320  NA 142 140  --  142 143 145  K 4.8 5.1  --  4.2 4.5 3.9  CL 103 101  --  100 101 102  CO2 17* 13*  --  17* 16* 20*  GLUCOSE 110* 126*  --  62* 72 108*  BUN 98* 115*  --  111* 105* 102*  CREATININE 5.33* 6.31*  --  5.70* 5.47* 5.04*  CALCIUM  9.4 8.4*  --  7.8* 8.3* 8.0*  MG  --   --  2.6* 2.3  --  2.3  PHOS  --   --  8.3* 5.9*  --  4.6     CBC: Recent Labs  Lab 09/22/24 1758 09/23/24 0628 09/24/24 0445 09/25/24 0310 09/26/24 0320  WBC 11.4* 10.0 18.4* 8.6 5.9  NEUTROABS 9.1*  --   --   --   --   HGB 11.1* 11.2* 9.9* 9.9* 9.8*  HCT 34.9* 36.1* 31.7* 31.0*  30.0*  MCV 94.8 95.8 95.5 93.4 92.6  PLT 194 169 163 129* 122*     No results found for: HEPBSAG, HEPBSAB, HEPBIGM    Microbiology:  Recent Results (from the past 240 hours)  Culture, blood (routine x 2)     Status: Abnormal   Collection Time: 09/22/24  5:37 PM   Specimen: BLOOD LEFT HAND  Result Value Ref Range Status   Specimen Description   Final    BLOOD LEFT HAND Performed at [facility], [address] [place], [place] [address]    Special Requests   Final    BOTTLES DRAWN AEROBIC AND ANAEROBIC Blood Culture results may not be optimal due to an inadequate volume of blood received in culture bottles Performed at [facility], [address] [place], [place] [address]    Culture  Setup Time   Final    GRAM POSITIVE COCCI AEROBIC BOTTLE ONLY CRITICAL VALUE NOTED.  VALUE IS CONSISTENT WITH PREVIOUSLY REPORTED AND CALLED VALUE. Performed at [facility], [address], [place], [place] [address]    Culture STAPHYLOCOCCUS EPIDERMIDIS (A)  Final  Report Status 09/25/2024 FINAL  Final   Organism ID, Bacteria STAPHYLOCOCCUS EPIDERMIDIS  Final      Susceptibility   Staphylococcus epidermidis - MIC*    CIPROFLOXACIN 4 RESISTANT Resistant     ERYTHROMYCIN >=8 RESISTANT Resistant     GENTAMICIN  >=16 RESISTANT Resistant     OXACILLIN >=4 RESISTANT Resistant     TETRACYCLINE <=1 SENSITIVE Sensitive     VANCOMYCIN  2 SENSITIVE Sensitive     TRIMETH/SULFA 160 RESISTANT Resistant     CLINDAMYCIN >=8 RESISTANT Resistant     RIFAMPIN <=0.5 SENSITIVE Sensitive     Inducible Clindamycin NEGATIVE Sensitive     * STAPHYLOCOCCUS EPIDERMIDIS  Culture, blood (routine x 2)     Status: Abnormal   Collection Time: 09/22/24  5:57 PM   Specimen: Right Antecubital; Blood  Result Value Ref Range Status   Specimen Description   Final    RIGHT ANTECUBITAL Performed at [facility], [address] [place], [place] [address]    Special Requests    Final    BOTTLES DRAWN AEROBIC AND ANAEROBIC Blood Culture results may not be optimal due to an inadequate volume of blood received in culture bottles Performed at [facility], [address] [place], [place] [address]    Culture  Setup Time   Final    GRAM POSITIVE COCCI ANAEROBIC BOTTLE ONLY CRITICAL RESULT CALLED TO, READ BACK BY AND VERIFIED WITH: EMILY STEINBOCK 09/23/24 1424 KLW    Culture STAPHYLOCOCCUS EPIDERMIDIS (A)  Final   Report Status 09/25/2024 FINAL  Final   Organism ID, Bacteria STAPHYLOCOCCUS EPIDERMIDIS  Final      Susceptibility   Staphylococcus epidermidis - MIC*    CIPROFLOXACIN <=0.5 SENSITIVE Sensitive     ERYTHROMYCIN >=8 RESISTANT Resistant     GENTAMICIN  >=16 RESISTANT Resistant     OXACILLIN >=4 RESISTANT Resistant     TETRACYCLINE <=1 SENSITIVE Sensitive     VANCOMYCIN  1 SENSITIVE Sensitive     TRIMETH/SULFA 160 RESISTANT Resistant     CLINDAMYCIN <=0.25 SENSITIVE Sensitive     RIFAMPIN <=0.5 SENSITIVE Sensitive     Inducible Clindamycin NEGATIVE Sensitive     * STAPHYLOCOCCUS EPIDERMIDIS  Blood Culture ID Panel (Reflexed)     Status: Abnormal   Collection Time: 09/22/24  5:57 PM  Result Value Ref Range Status   Enterococcus faecalis NOT DETECTED NOT DETECTED Final   Enterococcus Faecium NOT DETECTED NOT DETECTED Final   Listeria monocytogenes NOT DETECTED NOT DETECTED Final   Staphylococcus species DETECTED (A) NOT DETECTED Final    Comment: CRITICAL RESULT CALLED TO, READ BACK BY AND VERIFIED WITH: EMILY STEINBOCK 09/23/24 1424 KLW    Staphylococcus aureus (BCID) NOT DETECTED NOT DETECTED Final   Staphylococcus epidermidis DETECTED (A) NOT DETECTED Final    Comment: Methicillin (oxacillin) resistant coagulase negative staphylococcus. Possible blood culture contaminant (unless isolated from more than one blood culture draw or clinical case suggests pathogenicity). No antibiotic treatment is indicated for blood  culture  contaminants. CRITICAL RESULT CALLED TO, READ BACK BY AND VERIFIED WITH: EMILY STEINBOCK 09/23/24 1424 KLW    Staphylococcus lugdunensis NOT DETECTED NOT DETECTED Final   Streptococcus species NOT DETECTED NOT DETECTED Final   Streptococcus agalactiae NOT DETECTED NOT DETECTED Final   Streptococcus pneumoniae NOT DETECTED NOT DETECTED Final   Streptococcus pyogenes NOT DETECTED NOT DETECTED Final   A.calcoaceticus-baumannii NOT DETECTED NOT DETECTED Final   Bacteroides fragilis NOT DETECTED NOT DETECTED Final   Enterobacterales NOT DETECTED NOT DETECTED Final  Enterobacter cloacae complex NOT DETECTED NOT DETECTED Final   Escherichia coli NOT DETECTED NOT DETECTED Final   Klebsiella aerogenes NOT DETECTED NOT DETECTED Final   Klebsiella oxytoca NOT DETECTED NOT DETECTED Final   Klebsiella pneumoniae NOT DETECTED NOT DETECTED Final   Proteus species NOT DETECTED NOT DETECTED Final   Salmonella species NOT DETECTED NOT DETECTED Final   Serratia marcescens NOT DETECTED NOT DETECTED Final   Haemophilus influenzae NOT DETECTED NOT DETECTED Final   Neisseria meningitidis NOT DETECTED NOT DETECTED Final   Pseudomonas aeruginosa NOT DETECTED NOT DETECTED Final   Stenotrophomonas maltophilia NOT DETECTED NOT DETECTED Final   Candida albicans NOT DETECTED NOT DETECTED Final   Candida auris NOT DETECTED NOT DETECTED Final   Candida glabrata NOT DETECTED NOT DETECTED Final   Candida krusei NOT DETECTED NOT DETECTED Final   Candida parapsilosis NOT DETECTED NOT DETECTED Final   Candida tropicalis NOT DETECTED NOT DETECTED Final   Cryptococcus neoformans/gattii NOT DETECTED NOT DETECTED Final   Methicillin resistance mecA/C DETECTED (A) NOT DETECTED Final    Comment: CRITICAL RESULT CALLED TO, READ BACK BY AND VERIFIED WITH: EMILY STEINBOCK 09/23/24 1424 KLW Performed at [facility], [address] [place], [place] [address]   Resp panel by RT-PCR (RSV, Flu A&B, Covid) Anterior  Nasal Swab     Status: None   Collection Time: 09/22/24  5:58 PM   Specimen: Anterior Nasal Swab  Result Value Ref Range Status   SARS Coronavirus 2 by RT PCR NEGATIVE NEGATIVE Final    Comment: (NOTE) SARS-CoV-2 target nucleic acids are NOT DETECTED.  The SARS-CoV-2 RNA is generally detectable in upper respiratory specimens during the acute phase of infection. The lowest concentration of SARS-CoV-2 viral copies this assay can detect is 138 copies/mL. A negative result does not preclude SARS-Cov-2 infection and should not be used as the sole basis for treatment or other patient management decisions. A negative result may occur with  improper specimen collection/handling, submission of specimen other than nasopharyngeal swab, presence of viral mutation(s) within the areas targeted by this assay, and inadequate number of viral copies(<138 copies/mL). A negative result must be combined with clinical observations, patient history, and epidemiological information. The expected result is Negative.  Fact Sheet for Patients:  bloggercourse.com  Fact Sheet for Healthcare Providers:  seriousbroker.it  This test is no t yet approved or cleared by the [place]  FDA and  has been authorized for detection and/or diagnosis of SARS-CoV-2 by FDA under an Emergency Use Authorization (EUA). This EUA will remain  in effect (meaning this test can be used) for the duration of the COVID-19 declaration under Section 564(b)(1) of the Act, 21 U.S.C.section 360bbb-3(b)(1), unless the authorization is terminated  or revoked sooner.       Influenza A by PCR NEGATIVE NEGATIVE Final   Influenza B by PCR NEGATIVE NEGATIVE Final    Comment: (NOTE) The Xpert Xpress SARS-CoV-2/FLU/RSV plus assay is intended as an aid in the diagnosis of influenza from Nasopharyngeal swab specimens and should not be used as a sole basis for treatment. Nasal washings  and aspirates are unacceptable for Xpert Xpress SARS-CoV-2/FLU/RSV testing.  Fact Sheet for Patients: bloggercourse.com  Fact Sheet for Healthcare Providers: seriousbroker.it  This test is not yet approved or cleared by the [place]  FDA and has been authorized for detection and/or diagnosis of SARS-CoV-2 by FDA under an Emergency Use Authorization (EUA). This EUA will remain in effect (meaning this test can be used) for the duration  of the COVID-19 declaration under Section 564(b)(1) of the Act, 21 U.S.C. section 360bbb-3(b)(1), unless the authorization is terminated or revoked.     Resp Syncytial Virus by PCR NEGATIVE NEGATIVE Final    Comment: (NOTE) Fact Sheet for Patients: bloggercourse.com  Fact Sheet for Healthcare Providers: seriousbroker.it  This test is not yet approved or cleared by the [place]  FDA and has been authorized for detection and/or diagnosis of SARS-CoV-2 by FDA under an Emergency Use Authorization (EUA). This EUA will remain in effect (meaning this test can be used) for the duration of the COVID-19 declaration under Section 564(b)(1) of the Act, 21 U.S.C. section 360bbb-3(b)(1), unless the authorization is terminated or revoked.  Performed at [facility], [address] [place], [place] [address]   Culture, blood (Routine X 2) w Reflex to ID Panel     Status: None (Preliminary result)   Collection Time: 09/24/24  6:18 PM   Specimen: BLOOD  Result Value Ref Range Status   Specimen Description BLOOD BLOOD RIGHT HAND  Final   Special Requests   Final    BOTTLES DRAWN AEROBIC AND ANAEROBIC Blood Culture adequate volume   Culture   Final    NO GROWTH 2 DAYS Performed at [facility], [address] [place], [place] [address]    Report Status PENDING  Incomplete  MRSA Next Gen by PCR, Nasal     Status: Abnormal    Collection Time: 09/24/24  6:18 PM   Specimen: Nasal Mucosa; Nasal Swab  Result Value Ref Range Status   MRSA by PCR Next Gen DETECTED (A) NOT DETECTED Final    Comment: RESULT CALLED TO, READ BACK BY AND VERIFIED WITH:  GEORGINA OROZCO AT 2345 09/24/24 JG (NOTE) The GeneXpert MRSA Assay (FDA approved for NASAL specimens only), is one component of a comprehensive MRSA colonization surveillance program. It is not intended to diagnose MRSA infection nor to guide or monitor treatment for MRSA infections. Test performance is not FDA approved in patients less than 88 years old. Performed at [facility], [address] [place], [place] [address]   Culture, blood (Routine X 2) w Reflex to ID Panel     Status: None (Preliminary result)   Collection Time: 09/24/24  9:16 PM   Specimen: BLOOD  Result Value Ref Range Status   Specimen Description BLOOD BLOOD RIGHT HAND  Final   Special Requests   Final    BOTTLES DRAWN AEROBIC AND ANAEROBIC Blood Culture results may not be optimal due to an inadequate volume of blood received in culture bottles   Culture   Final    NO GROWTH 2 DAYS Performed at [facility], [address], [place], [place] [address]    Report Status PENDING  Incomplete  Urine Culture (for pregnant, neutropenic or urologic patients or patients with an indwelling urinary catheter)     Status: None   Collection Time: 09/25/24 10:03 AM   Specimen: Urine, Clean Catch  Result Value Ref Range Status   Specimen Description   Final    URINE, CLEAN CATCH Performed at [facility], [address], [place], [place] [address]    Special Requests   Final    NONE Performed at [facility], [address], [place], [place] [address]    Culture   Final    NO GROWTH Performed at [facility], [address], [place], [place] [address]    Report Status 09/26/2024 FINAL  Final  Coagulation Studies: No results for input(s): LABPROT,  INR in the last 72 hours.  Urinalysis: Recent Labs    09/25/24 1003  COLORURINE YELLOW*  LABSPEC 1.012  PHURINE 5.0  GLUCOSEU NEGATIVE  HGBUR LARGE*  BILIRUBINUR NEGATIVE  KETONESUR 5*  PROTEINUR 30*  NITRITE NEGATIVE  LEUKOCYTESUR LARGE*      Imaging: DG Abd Portable 1V Result Date: 09/25/2024 CLINICAL DATA:  Nasogastric tube present. EXAM: PORTABLE ABDOMEN - 1 VIEW COMPARISON:  09/23/2024 FINDINGS: Tip of the enteric tube below the diaphragm in the stomach. The side port is just beyond the gastroesophageal junction. Prominent air-filled loops of bowel in the left abdomen. No evidence of free air. IMPRESSION: Tip of the enteric tube below the diaphragm in the stomach. The side port is just beyond the gastroesophageal junction. Electronically Signed   By: Andrea Gasman M.D.   On: 09/25/2024 17:34     Medications:    ertapenem  500 mg (09/26/24 1422)   feeding supplement (OSMOLITE 1.5 CAL) 35 mL/hr at 09/26/24 0707   sodium bicarbonate  75 mEq in sodium chloride  0.45 % 1,075 mL infusion 125 mL/hr at 09/26/24 1048    ARIPiprazole   2 mg Per Tube QHS   artificial tears  1 drop Both Eyes QHS   vitamin C  500 mg Per Tube BID   aspirin   81 mg Per Tube Daily   clopidogrel   75 mg Per Tube Daily   cyanocobalamin  1,000 mcg Intramuscular Daily   Followed by   NOREEN ON 10/02/2024] vitamin B-12  1,000 mcg Per Tube Daily   feeding supplement (PROSource TF20)  60 mL Per Tube Daily   folic acid   1 mg Per Tube Daily   free water   30 mL Per Tube Q4H   guaiFENesin   10 mL Per Tube Q4H   heparin  injection (subcutaneous)  5,000 Units Subcutaneous Q8H   levETIRAcetam   500 mg Oral Q24H   Or   levETIRAcetam   500 mg Intravenous Q24H   multivitamin with minerals  1 tablet Per Tube Daily   mupirocin  ointment  1 Application Nasal BID   omeprazole  20 mg Per Tube BID   mouth rinse  15 mL Mouth Rinse 4 times per day   senna-docusate  2 tablet Per Tube QHS   sertraline   175 mg Per Tube Daily    thiamine   100 mg Per Tube Daily   [START ON 09/29/2024] Vitamin D (Ergocalciferol)  50,000 Units Oral Q7 days   zinc sulfate (50mg  elemental zinc)  220 mg Per Tube Daily   acetaminophen  **OR** acetaminophen , camphor-menthol , ipratropium-albuterol , lactulose , ondansetron  **OR** ondansetron  (ZOFRAN ) IV, mouth rinse  Assessment/ Plan:  74 y.o. male with with multiple medical problems including history of stroke, peripheral vascular disease, hypertension, type 2 diabetes admitted on 09/22/2024 for Acute metabolic encephalopathy [G93.41]   Acute kidney injury Chronic kidney disease, stage IIIa.  Creatinine 1.54/GFR 49 from 08/04/2022 Diabetes type 2 with chronic kidney disease History of chronic left hydronephrosis  history of kidney stones and left ureteral stent placement and removal in February 2024 Acute metabolic encephalopathy Acute metabolic acidosis   Patient has severe acute kidney injury.  He has history of kidney stones, left hydronephrosis and left ureteral stent placement.  Renal ultrasound from 09/24/2024 is negative for hydronephrosis.  Nonobstructing calculi in both kidneys Urine output about 1150 cc.  Creatinine has improved also.  No acute indication for dialysis at present. Continue sodium bicarbonate  at 75 cc/h for acute metabolic acidosis.    Hemoglobin A1c  5.6% from 09/22/2024.  Management as as per primary team.  Urinary tract infection-antibiotics changed to ertapenem     LOS: 4 [facility] 1/2/20263:29 PM  [facility] [place], [place] [phone]

## 2024-09-26 NOTE — Progress Notes (Signed)
 Pharmacy Antibiotic Note  Rodney Estrada is a 74 y.o. male admitted on 09/22/2024 with bacteremia.  Pharmacy has been consulted for vancomycin  dosing.  -Blood cx 12/29: 2 of 4 (anaerobic/aerobic different sets)- GPC: staph epi w/ mecA resistance -also with possible UTI, on ceftriaxone   1/2: vanc random 28   Assessment/Plan: ID consulted with plan to transition to daptomycin once vancomycin  trough < 20 Vancomycin  random level with AM labs Daptomycin 600 mg IV q 48 hours placeholder order for tomorrow pending vanc trough results   Height: 5' 9 (175.3 cm) Weight: 80.6 kg (177 lb 11.1 oz) IBW/kg (Calculated) : 70.7  Temp (24hrs), Avg:98.7 F (37.1 C), Min:98 F (36.7 C), Max:99.5 F (37.5 C)  Recent Labs  Lab 09/22/24 1758 09/23/24 0628 09/23/24 2345 09/24/24 0445 09/24/24 1820 09/24/24 2116 09/25/24 0310 09/25/24 1411 09/26/24 0320  WBC 11.4* 10.0  --  18.4*  --   --  8.6  --  5.9  CREATININE 5.40* 5.33*  --  6.31*  --   --  5.70* 5.47* 5.04*  LATICACIDVEN 1.0  --   --   --  0.8 0.9  --   --   --   VANCORANDOM  --   --    < >  --   --   --  33  --  28   < > = values in this interval not displayed.    Estimated Creatinine Clearance: 13.2 mL/min (A) (by C-G formula based on SCr of 5.04 mg/dL (H)).    Allergies[1]  Antimicrobials this admission: Cefepime  x 1 12/29 Ceftriaxone  12/30 >>   Vancomycin   12/29(evening) >>  12/31  Dose adjustments this admission:    Microbiology results: Blood cx 12/29: 1 of 4 (anaerobic and aerobic different sets)- GPC: staph epi w/ mecA resistance  Repeat BCx: NGTD x 2 UCx:    Sputum:    MRSA PCR:  pos  Thank you for allowing pharmacy to be a part of this patients care.  Glendia Lye, PharmD, BCPS 09/26/2024         [1] No Known Allergies

## 2024-09-26 NOTE — Plan of Care (Signed)
  Problem: Nutrition: Goal: Adequate nutrition will be maintained Outcome: Progressing   Problem: Elimination: Goal: Will not experience complications related to bowel motility Outcome: Progressing Goal: Will not experience complications related to urinary retention Outcome: Progressing   Problem: Pain Managment: Goal: General experience of comfort will improve and/or be controlled Outcome: Progressing   Problem: Safety: Goal: Ability to remain free from injury will improve Outcome: Progressing   Problem: Skin Integrity: Goal: Risk for impaired skin integrity will decrease Outcome: Progressing

## 2024-09-26 NOTE — Consult Note (Signed)
 "                                   Consultation Note Date: 09/26/2024   Patient Name: Rodney Estrada  DOB: June 08, 1951  MRN: [id number]  Age / Sex: 74 y.o., male  PCP: Pcp, No Referring Physician: Von Bellis, MD  Reason for Consultation: Establishing goals of care   HPI/Brief Hospital Course: 74 y.o. male  with past medical history of CAD, CHF, HTN, HLD, A. Fib, T2DM and CVA with left sided hemiparesis admitted from [facility] on 09/22/2024 with increased confusion and decreased responsiveness reported by facility staff, symptoms first noticed Friday prior to admission on Sunday.  Admitted and being treated for UTI, severe AKI, blood cultures positive for staph epidermis possible contamination, and elevated troponin levels--echo ordered  Palliative medicine was consulted for assisting with goals of care conversations.  Subjective:  Extensive chart review has been completed prior to meeting patient including labs, vital signs, imaging, progress notes, orders, and available advanced directive documents from current and previous encounters.  Visited with Rodney Estrada at his bedside. He is resting in bed with eyes closed, does not respond to calling of his name or gentle tactile stimuli. Nursing staff aware of LOC--has been minimal since admission. No family or visitors at bedside during time of visit.  Called and spoke with sister-Rodney Estrada. Rodney Estrada shares she has been acutely ill, conversation complicated by her congestion and hoarseness. Shares due to acute illness she has not and will not be able to visit Rodney Estrada in the hospital.  Introduced myself as a publishing rights manager as a member of the palliative care team. Explained palliative medicine is specialized medical care for people living with serious illness. It focuses on providing relief from the symptoms and stress of a serious illness. The goal is to improve quality of life for both the patient and the family.   Rodney Estrada shares she is  the only relative and [facility] and for Rodney Estrada. Rodney Estrada shares she has been receiving updates from medical team--struggling with be able to fully understand and make decisions based on her acute illness. Rodney Estrada has been a LTC resident since his CVA about 4 years ago. At baseline, he is wheelchair bound and dependent with all ADL's. He is able to feed himself, has a good appetite. He is able to verbally communicate and express his needs. Rodney Estrada shares she was able to visit with her brother about 2 weeks ago and Rodney Estrada was at his baseline.  We discussed his reason for hospitalization including UTI and severe AKI with concern for possible needs for hemodialysis.  We discussed patient's current illness and what it means in the larger context of patient's on-going co-morbidities. Natural disease trajectory and expectations at EOL were discussed.   Rodney Estrada shares she and Rodney Estrada have never discussed his wishes in detail but she feels Rodney Estrada would want to continue all treatments with an opportunity to improve and recover. In regards to hemodialysis she is unsure on how Rodney Estrada would want to proceed, she agrees long term HD would likely not be best for him but not in a place to make a final decision. At this time, she wishes to continue current plan of care allowing time for outcomes.  I discussed importance of continued conversations with family/support persons and all members of their medical team regarding overall plan of care and treatment options ensuring  decisions are in alignment with patients goals of care.  All questions/concerns addressed. Emotional support provided to patient/family/support persons. PMT will continue to follow and support patient as needed.  Objective: Primary Diagnoses: Present on Admission:  Acute metabolic encephalopathy  Essential hypertension   Physical Exam Constitutional:      General: He is not in acute distress.    Appearance: He is ill-appearing.   Pulmonary:     Effort: Pulmonary effort is normal. No respiratory distress.  Abdominal:     General: There is no distension.     Palpations: Abdomen is soft.  Musculoskeletal:     Comments: LUE contracture  Skin:    General: Skin is warm.     Coloration: Skin is pale.     Findings: Bruising present.  Neurological:     Comments: Minimally responsive     Vital Signs: BP 126/73 (BP Location: Right Arm)   Pulse 68   Temp 98.6 F (37 C)   Resp 16   Ht 5' 9 (1.753 m)   Wt 80.6 kg   SpO2 98%   BMI 26.24 kg/m  Pain Scale: PAINAD   Pain Score: 0-No pain  IO: Intake/output summary:  Intake/Output Summary (Last 24 hours) at 09/26/2024 1659 Last data filed at 09/26/2024 1500 Gross per 24 hour  Intake 3321.83 ml  Output 2750 ml  Net 571.83 ml    LBM: Last BM Date : 09/26/24 Baseline Weight: Weight: 81.1 kg Most recent weight: Weight: 80.6 kg      Assessment and Plan  SUMMARY OF RECOMMENDATIONS   Continue current plan of care Time for outcomes  Palliative Prophylaxis:   Bowel Regimen, Delirium Protocol and Frequent Pain Assessment   Thank you for this consult and allowing Palliative Medicine to participate in the care of Rodney Estrada. Palliative medicine will continue to follow and assist as needed.   Visit includes: Detailed review of medical records (labs, imaging, vital signs), medically appropriate exam (mental status, respiratory, cardiac, skin), discussed with treatment team, counseling and educating patient, family and staff, documenting clinical information, medication management and coordination of care.   Signed by: Rodney Lesches, DNP, AGNP-C Palliative Medicine    Please contact Palliative Medicine Team phone at [phone] for questions and concerns.  For individual provider: See Amion   "

## 2024-09-26 NOTE — Progress Notes (Signed)
 [facility] Progress Note  Patient: Rodney Estrada    FMW:[id number]  DOA: 09/22/2024     Date of Service: the patient was seen and examined on 09/26/2024  No chief complaint on file.  Brief hospital course:  from H&P - Rodney Estrada is a 74 y.o. male with medical history significant for coronary artery disease, chronic systolic CHF, gout, hypertension, dyslipidemia, atrial fibrillation and CVA as well as type 2 diabetes mellitus who presented to the emergency room 12/29 with acute onset of altered mental status with confusion and decreased responsiveness since Friday 12/26 at his SNF.  He has been nonverbal with increased tremors.  No reported fever or chills.  SNF staff noted congested cough and therefore the patient was given IM Rocephin  this afternoon.  The patient was nonverbal during admitting hospitalist's interview and would answer some questions nodding his head.  No chest pain or palpitations were reported.  No reported nausea or vomiting or diarrhea or abdominal pain.     12/29: to ED. (+)UTI, started on IV fluids, vanc, cefepime . Admitted to hospitalist for acute encephalopathy d/t UTI, AKI on CKD 3a assoc w/ hyperkalemia. Concern w/ elevation troponins, heparin  gtt started.  12/30: holding heparin  gtt given hematuria. Cardiology following, in agreement. Echo pending. Pt remains confused. Echo pending.          Consultants:  Cardiology signed off on 12/31 Nephrology   Procedures/Surgeries: None   Assessment and Plan:  # Severe sepsis secondary to UTI Leukocytosis, tachycardia, tachypneic and hypotensive on 12/31 Patient responded well to IV fluid, did not require Levophed Transferred to ICU, lactic acid within normal range and vitals improved after IV fluid so transferred to progressive unit.  cortisol level wnl S/p vancomycin , trough level was high, DC'd S/p ceftriaxone  till 1/2 12/31 NS 500 mL bolus and LR 500 bolus, 1/1 LR 500 mL bolus PCCM consulted, LR 500 bolus  ordered, if no improvement then patient will need Levophed. 1/2 started Invanz  as per ID   # Acute metabolic encephalopathy secondary to uremia MRI brain pending radiology read - appears stable from previous on personal review  Continue neurochecks  keppra  levels elevated, pharmacy consulted for dose and monitor Hold gabapentin  (300 mg tid home dose)    # AKI on CKD stage III # Metabolic encephalopathy secondary to uremia # Metabolic acidosis History of chronic left hydronephrosis  history of kidney stones and left ureteral stent placement and removal in February 2024 Bicarbonate 100 mEq IV one-time push given Started bicarb IV infusion Nephrology consulted for urgent hemodialysis Palliative care consulted, as per nephro patient may not be able to get hemodialysis done as an outpatient due to comorbidities. 1/2 discussed with nephrology again today to start hemodialysis as patient's mental status is not improving due to uremia Nephrology will revisit tomorrow and consider hemodialysis tomorrow a.m. if needed   # Acute lower UTI IV Rocephin   1/1 repeated UA and urine cultures pending     # Staph epidermidis blood culture positive Possible contamination Urine culture negative S/p vancomycin , DC'd due to renal failure 1/2 started Invanz  ID consult appreciated    # Elevated troponin / Type II  likely demand ischemia  S/p IV heparin  dc TTE LVEF >55, Left ventricular endocardial border not optimally defined to evaluate regional wall motion, moderate LVH. Cardiology consulted recommended no intervention.  Signed off on 12/31     # Gross hematuria: Resolved  Question due to UTI Monitor CBC   # Hyperkalemia D/t AKI Treat underlying cause(s)  as noted  S/p lokelma , repeat as needed Follow BMP   # Essential hypertension but low BP due to sepsis Hold amlodipine , lisinopril     # Coronary artery disease # HTN ASA, Plavix , statin Hold antiypertensives    Gout Hold  allopurinol  w/ AKI   Depression Continue Zoloft  home dose 175 mg daily --> 150  Abilify     GERD without esophagitis PPI   Seizure disorder continue Keppra  Check keppra  levels    Constipation Bowel regimen per orders   Type 2 diabetes mellitus with peripheral neuropathy  Not on home antihyperglycemics No glucosuria Holding SSI Monitor Glc achs for now and initiate insulin  if needed  A1C hold Neurontin  w/ AKI and encephalopathy   Prednisone rx outpatient started 12/26 20 mg daily NOT chronic medication Uncertain why but suspect as above he was treated for presumed respiratory illness at faciltiy - will dc this  cortisol level wnl  # Folic acid  deficiency: started folic acid  1 mg p.o. daily.  Follow-up with PCP to repeat folic acid  level in between 3 to 6 months.   Overall condition prognosis is very poor, patient may not survive. Critical care consulted, we will continue above management. Palliative care consulted for goals of care discussion.   Body mass index is 26.24 kg/m.  Interventions:  Diet: NPO DVT Prophylaxis: Subcutaneous Heparin     Advance goals of care discussion: DNR-limited  Family Communication: family was not present at bedside, at the time of interview.  The pt provided permission to discuss medical plan with the family. Opportunity was given to ask question and all questions were answered satisfactorily.  12/31 called and left a VM for his sister Rodney Estrada [phone]  09/25/24 I spoke to patient's sister today and updated the management plan and his current situation, poor prognosis.  Disposition:  Pt is from SNF, admitted with sepsis, AMS, AKI, uremia, still critically ill, which precludes a safe discharge. Discharge to SNF, when stable, may need few days to improve.  Subjective: No significant events overnight.  Patient remained obtunded, unable to wake up, he did move his right hand after calling his name multiple times and  applying gentle pressure on the bilateral chest wall.    Physical Exam: General: Remained obtunded Eyes: clossed ENT: Oral Mucosa Clear, but dry Neck: no JVD,  Cardiovascular: S1 and S2 Present, no Murmur,  Respiratory: good respiratory effort, Bilateral Air entry equal and Decreased, no Crackles, no wheezes Abdomen: Bowel Sound present, Soft and no tenderness,  Skin: no rashes Extremities: no Pedal edema, no calf tenderness Neurologic: Left-sided residual weakness due to prior CVA.  Patient is obtunded, unable to follow command   Vitals:   09/26/24 0458 09/26/24 0740 09/26/24 1217 09/26/24 1608  BP:  127/72 131/82 126/73  Pulse:  70 70 68  Resp:  16 16 16   Temp:  98.8 F (37.1 C) 98.3 F (36.8 C) 98.6 F (37 C)  TempSrc:      SpO2:  95% 98% 98%  Weight: 80.6 kg     Height:        Intake/Output Summary (Last 24 hours) at 09/26/2024 1646 Last data filed at 09/26/2024 1500 Gross per 24 hour  Intake 3321.83 ml  Output 2750 ml  Net 571.83 ml   Filed Weights   09/22/24 1725 09/22/24 2134 09/26/24 0458  Weight: 81.1 kg 77 kg 80.6 kg    Data Reviewed: I have personally reviewed and interpreted daily labs, tele strips, imagings as discussed above. I reviewed all nursing  notes, pharmacy notes, vitals, pertinent old records I have discussed plan of care as described above with RN and patient/family.  CBC: Recent Labs  Lab 09/22/24 1758 09/23/24 0628 09/24/24 0445 09/25/24 0310 09/26/24 0320  WBC 11.4* 10.0 18.4* 8.6 5.9  NEUTROABS 9.1*  --   --   --   --   HGB 11.1* 11.2* 9.9* 9.9* 9.8*  HCT 34.9* 36.1* 31.7* 31.0* 30.0*  MCV 94.8 95.8 95.5 93.4 92.6  PLT 194 169 163 129* 122*   Basic Metabolic Panel: Recent Labs  Lab 09/23/24 0628 09/24/24 0445 09/24/24 0955 09/25/24 0310 09/25/24 1411 09/26/24 0320  NA 142 140  --  142 143 145  K 4.8 5.1  --  4.2 4.5 3.9  CL 103 101  --  100 101 102  CO2 17* 13*  --  17* 16* 20*  GLUCOSE 110* 126*  --  62* 72 108*   BUN 98* 115*  --  111* 105* 102*  CREATININE 5.33* 6.31*  --  5.70* 5.47* 5.04*  CALCIUM  9.4 8.4*  --  7.8* 8.3* 8.0*  MG  --   --  2.6* 2.3  --  2.3  PHOS  --   --  8.3* 5.9*  --  4.6    Studies: DG Abd 1 View Result Date: 09/26/2024 CLINICAL DATA:  Nasogastric tube. EXAM: ABDOMEN - 1 VIEW COMPARISON:  Radiograph yesterday FINDINGS: Tip and side port of the enteric tube below the diaphragm in the stomach. Slight decreased gaseous bowel distention in the upper abdomen. No evidence of free air. IMPRESSION: Tip and side port of the enteric tube below the diaphragm in the stomach. Electronically Signed   By: Andrea Gasman M.D.   On: 09/26/2024 16:20    Scheduled Meds:  ARIPiprazole   2 mg Per Tube QHS   artificial tears  1 drop Both Eyes QHS   vitamin C  500 mg Per Tube BID   aspirin   81 mg Per Tube Daily   clopidogrel   75 mg Per Tube Daily   cyanocobalamin  1,000 mcg Intramuscular Daily   Followed by   NOREEN ON 10/02/2024] vitamin B-12  1,000 mcg Per Tube Daily   feeding supplement (PROSource TF20)  60 mL Per Tube Daily   folic acid   1 mg Per Tube Daily   free water   30 mL Per Tube Q4H   guaiFENesin   10 mL Per Tube Q4H   heparin  injection (subcutaneous)  5,000 Units Subcutaneous Q8H   levETIRAcetam   500 mg Oral Q24H   Or   levETIRAcetam   500 mg Intravenous Q24H   multivitamin with minerals  1 tablet Per Tube Daily   mupirocin  ointment  1 Application Nasal BID   omeprazole  20 mg Per Tube BID   mouth rinse  15 mL Mouth Rinse 4 times per day   senna-docusate  2 tablet Per Tube QHS   sertraline   175 mg Per Tube Daily   thiamine   100 mg Per Tube Daily   [START ON 09/29/2024] Vitamin D (Ergocalciferol)  50,000 Units Oral Q7 days   zinc sulfate (50mg  elemental zinc)  220 mg Per Tube Daily   Continuous Infusions:  ertapenem  500 mg (09/26/24 1422)   feeding supplement (OSMOLITE 1.5 CAL) 35 mL/hr at 09/26/24 0707   sodium bicarbonate  75 mEq in sodium chloride  0.45 % 1,075 mL infusion  125 mL/hr at 09/26/24 1048   PRN Meds: acetaminophen  **OR** acetaminophen , camphor-menthol , ipratropium-albuterol , lactulose , ondansetron  **OR** ondansetron  (ZOFRAN ) IV, mouth rinse  Time spent: 55 minutes  Author: ELVAN SOR. MD [facility] 09/26/2024 4:46 PM  To reach On-call, see care teams to locate the attending and reach out to them via [email or web address]. If 7PM-7AM, please contact night-coverage If you still have difficulty reaching the attending provider, please page the [facility] (Director on Call) for [facility] on amion for assistance.

## 2024-09-26 NOTE — Progress Notes (Signed)
 Nutrition Follow-up  DOCUMENTATION CODES:   Not applicable  INTERVENTION:   -TF via NGT:   Continue Osmolite 1.5 @ 35 ml/hr and increase by 10 ml every 12 hours to goal rate of 65 ml/hr.   60 ml Prosource TF20 daily  30 ml free water  flush every 4 hours  Tube feeding regimen provides 2060 kcal (100% of needs), 103 grams of protein, and 1006 ml of H2O.  Total free water : 1186 ml daily  -Continue 1 mg folic acid  daily -Continue MVI with minerals daily -Continue 100 mg thiamine  daily x 7 days -500 mg vitamin C BID -220 mg zinc sulfate daily x 14 days  -Monitor Mg, K, and Phos and replete as  needed secondary to high refeeding risk  -RD will draw labs to assess for potential micronutrient deficiens which may impede wound healing: vitamin A and CRP (to best interpret lab values  NUTRITION DIAGNOSIS:   Inadequate oral intake related to inability to eat, lethargy/confusion as evidenced by NPO status.  Ongoing  GOAL:   Patient will meet greater than or equal to 90% of their needs  Progressing   MONITOR:   TF tolerance, Labs, Weight trends, I & O's  REASON FOR ASSESSMENT:   Consult Enteral/tube feeding initiation and management  ASSESSMENT:   Pt with hx of PVD, CAD, CHF, gout, HLD, HTN, atrial fibrillation, prior CVA, hx ETOH abuse/tobacco abuse, CKD3a, and DM type 2 presented to ED from his facility with AMS and lethargy worsening over the last several days.Found to have a severe AKI and UTI.  1/1- NGT placed (KUB confirmed tip of tube and side port in stomach), TF initiated  Reviewed I/O's: +2.4 L x 24 hours and +4.4 L since admission  UOP: 1.2 L x 24 hours  Patient is a resident of [facility]. No MAR available to assess.  Patient lethargic at time of visit. He did not interact with this RD. No family present to provide additional history.   Patient is NPO and receiving TF via NGT for sole source nutrition. Osmolite 1.5 infusing at 35 ml/hr.   No recent  weight history available to assess weight trends. Noted generalized ecchymosis on bilateral upper extremities.   Patient meets criteria for malnutrition and is at risk for refeeding syndrome. Will continue MVI, thiamine , and monitoring of electrolytes.   Palliative care consult pending for goals of care discussions.   Medications reviewed and include vitamin B-12, folic acid , keppra , senokot, thiamine , and vitamin D.   Labs reviewed: K, Mg, and Phos WDL. CBGS: 66-142 (inpatient orders for glycemic control are none).    NUTRITION - FOCUSED PHYSICAL EXAM:  Flowsheet Row Most Recent Value  Orbital Region Moderate depletion  Upper Arm Region Moderate depletion  Thoracic and Lumbar Region Mild depletion  Buccal Region Moderate depletion  Temple Region Moderate depletion  Clavicle Bone Region Moderate depletion  Clavicle and Acromion Bone Region Moderate depletion  Scapular Bone Region Moderate depletion  Dorsal Hand Moderate depletion  Patellar Region Mild depletion  Anterior Thigh Region Mild depletion  Posterior Calf Region Mild depletion  Edema (RD Assessment) Mild  Hair Reviewed  Eyes Reviewed  Mouth Reviewed  Skin Reviewed  Nails Reviewed    Diet Order:   Diet Order             Diet NPO time specified  Diet effective now                   EDUCATION NEEDS:   Not  appropriate for education at this time  Skin:  Skin Assessment: Skin Integrity Issues: Skin Integrity Issues:: DTI, Stage I DTI: right ankle, left ankle Stage I: buttocks  Last BM:  09/26/24 (type 7)  Height:   Ht Readings from Last 1 Encounters:  09/22/24 5' 9 (1.753 m)    Weight:   Wt Readings from Last 1 Encounters:  09/26/24 80.6 kg    Ideal Body Weight:  72.7 kg  BMI:  Body mass index is 26.24 kg/m.  Estimated Nutritional Needs:   Kcal:  1900-2100 kcal/d  Protein:  90-110g/d  Fluid:  >2L/d    Margery ORN, RD, LDN, CDCES Registered Dietitian III Certified Diabetes Care and  Education Specialist If unable to reach this RD, please use RD Inpatient group chat on secure chat between hours of 8am-4 pm daily

## 2024-09-26 NOTE — Consult Note (Signed)
 NAME: Rodney Estrada  DOB: Mar 15, 1951  MRN: [id number]  Date/Time: 09/26/2024 11:45 AM  REQUESTING PROVIDERDr.Kumar Subjective:  REASON FOR CONSULT: Bacteremia ?No history available from patient Rodney Estrada is a 74 y.o. male with a history of nephrolithiasis,left hydronephrosis s/p lithotripsy h/o ESBl Ecoli in 2023, CKD, CVA  presents from [facility]F on 09/[facility]/24 with altered mental status Vitals in the ED  BP 112/66 TEMP 97 Pulse 62 RR 15 And sats 100% Labs   Latest Reference Range & Units 09/22/24 17:58  WBC 4.0 - 10.5 K/uL 11.4 (H)  Hemoglobin 13.0 - 17.0 g/dL 88.8 (L)  HCT 60.9 - 47.9 % 34.9 (L)  Platelets 150 - 400 K/uL 194  Creatinine 0.61 - 1.24 mg/dL 4.59 (H)   Blood culture sent UA showed > 50 wbc No urine culture sent Renal US  showed  b/l non obstructing calculi Left perinephric fluid   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   a.) s/p mechanical thrombectomy + stenting (2.5 x 15 mm Resolute Onyx) + TPA   Aortic atherosclerosis    Atrial fibrillation (HCC)    a.) CHA2DS2VASc = 7 (age, CHF, HTN, CVA x2, vascular disease history, T2DM);  b.) rate/rhythm maintained on oral labetalol ; chronically anticoagulated with ASA + clopidogrel    Basal cell carcinoma    Bilateral carotid artery stenosis    a.) CTA neck 04/05/2020: 40-50% LICA   CAD (coronary artery disease)    Chronic gingivitis    Chronic systolic (congestive) heart failure (HCC)    a.) TTE 04/06/2020: EF 50-55%, mod LAE, mod AoV sclerosis/calc, triv TR/PR, mild MR, G2DD; b.) TTE 08/29/2021: EF >55%, mod LAE, triv AR/TR/PR. mild MR, G1DD.   Cocaine use    a.) last UDS (+) on 04/05/2020   Constipation    DOE (dyspnea on exertion)    Functional dyspepsia    Gout    Hemorrhoids    History of aspiration pneumonitis    History of ETOH abuse    HLD (hyperlipidemia)    Hypertension    Ileus (HCC)    Infection due to ESBL-producing Escherichia coli 08/02/2022   Left hemiplegia  (HCC) 04/05/2020   a.) s/p RIGHT MCA CVA   Long term current use of antithrombotics/antiplatelets    a.) on DAPT (ASA + clopidogrel )   Major depressive disorder    Muscle weakness    Nephrolithiasis    PVD (peripheral vascular disease)    SBO (small bowel obstruction) (HCC) 07/23/2022   Seborrhea capitis    T2DM (type 2 diabetes mellitus) (HCC)    Vitamin D deficiency     Past Surgical History:  Procedure Laterality Date   CYSTOSCOPY W/ URETERAL STENT REMOVAL Left 11/14/2022   Procedure: CYSTOSCOPY WITH STENT REMOVAL;  Surgeon: Twylla Glendia BROCKS, MD;  Location: [facility];  Service: Urology;  Laterality: Left;   CYSTOSCOPY/URETEROSCOPY/HOLMIUM LASER/STENT PLACEMENT Left 08/02/2022   Procedure: CYSTOSCOPY/URETEROSCOPY/HOLMIUM LASER/STENT PLACEMENT;  Surgeon: Twylla Glendia BROCKS, MD;  Location: [facility];  Service: Urology;  Laterality: Left;   CYSTOSCOPY/URETEROSCOPY/HOLMIUM LASER/STENT PLACEMENT Left 08/29/2022   Procedure: CYSTOSCOPY/URETEROSCOPY/HOLMIUM LASER/STENT EXCHANGE;  Surgeon: Twylla Glendia BROCKS, MD;  Location: [facility];  Service: Urology;  Laterality: Left;   IR CT HEAD LTD  04/05/2020   IR FLUORO GUIDE CV LINE RIGHT  08/01/2022   IR INTRA CRAN STENT  04/05/2020   IR PERCUTANEOUS ART THROMBECTOMY/INFUSION INTRACRANIAL INC DIAG ANGIO  04/05/2020       IR PERCUTANEOUS ART THROMBECTOMY/INFUSION INTRACRANIAL INC DIAG ANGIO  04/05/2020   IR US  GUIDE VASC ACCESS RIGHT  08/01/2022   RADIOLOGY WITH ANESTHESIA N/A 04/05/2020   Procedure: IR WITH ANESTHESIA;  Surgeon: Radiologist, Medication, MD;  Location: [facility];  Service: Radiology;  Laterality: N/A;    Social History   Socioeconomic History   Marital status: Single    Spouse name: Not on file   Number of children: Not on file   Years of education: Not on file   Highest education level: Not on file  Occupational History   Not on file  Tobacco Use   Smoking status: Every Day   Smokeless tobacco: Never  Substance and Sexual Activity    Alcohol  use: Yes    Alcohol /week: 10.0 standard drinks of alcohol     Types: 10 Cans of beer per week    Comment: every DAy   Drug use: Not Currently   Sexual activity: Not on file  Other Topics Concern   Not on file  Social History Narrative   Not on file   Social Drivers of Health   Tobacco Use: High Risk (09/22/2024)   Patient History    Smoking Tobacco Use: Every Day    Smokeless Tobacco Use: Never    Passive Exposure: Not on file  Financial Resource Strain: Not on file  Food Insecurity: Patient Unable To Answer (09/22/2024)   Epic    Worried About Programme Researcher, Broadcasting/film/video in the Last Year: Patient unable to answer    Ran Out of Food in the Last Year: Patient unable to answer  Transportation Needs: Patient Unable To Answer (09/22/2024)   Epic    Lack of Transportation (Medical): Patient unable to answer    Lack of Transportation (Non-Medical): Patient unable to answer  Physical Activity: Not on file  Stress: Not on file  Social Connections: Patient Unable To Answer (09/22/2024)   Social Connection and Isolation Panel    Frequency of Communication with Friends and Family: Patient unable to answer    Frequency of Social Gatherings with Friends and Family: Patient unable to answer    Attends Religious Services: Patient unable to answer    Active Member of Clubs or Organizations: Patient unable to answer    Attends Banker Meetings: Patient unable to answer    Marital Status: Patient unable to answer  Intimate Partner Violence: Patient Unable To Answer (09/22/2024)   Epic    Fear of Current or Ex-Partner: Patient unable to answer    Emotionally Abused: Patient unable to answer    Physically Abused: Patient unable to answer    Sexually Abused: Patient unable to answer  Depression (PHQ2-9): Not on file  Alcohol  Screen: Not on file  Housing: Patient Unable To Answer (09/22/2024)   Epic    Unable to Pay for Housing in the Last Year: Patient unable to answer     Number of Times Moved in the Last Year: Not on file    Homeless in the Last Year: Patient unable to answer  Utilities: Patient Unable To Answer (09/22/2024)   Epic    Threatened with loss of utilities: Patient unable to answer  Health Literacy: Not on file    Family History  Problem Relation Age of Onset   Hypertension Mother    Hypertension Father    Allergies[1] I? Current Facility-Administered Medications  Medication Dose Route Frequency Provider Last Rate Last Admin   acetaminophen  (TYLENOL ) tablet 650 mg  650 mg Per Tube Q6H PRN Von Bellis, MD  Or   acetaminophen  (TYLENOL ) suppository 650 mg  650 mg Rectal Q6H PRN Von Bellis, MD       ARIPiprazole  (ABILIFY ) tablet 2 mg  2 mg Per Tube QHS Von Bellis, MD       artificial tears ophthalmic solution 1 drop  1 drop Both Eyes QHS Mansy, Jan A, MD   1 drop at 09/25/24 2205   ascorbic acid (VITAMIN C) tablet 500 mg  500 mg Per Tube BID Von Bellis, MD       aspirin  chewable tablet 81 mg  81 mg Per Tube Daily Von Bellis, MD   81 mg at 09/26/24 9173   camphor-menthol  (SARNA) lotion 1 Application  1 Application Topical PRN Mansy, Jan A, MD       cefTRIAXone  (ROCEPHIN ) 2 g in sodium chloride  0.9 % 100 mL IVPB  2 g Intravenous Q24H Alexander, Natalie, DO 200 mL/hr at 09/25/24 1754 2 g at 09/25/24 1754   clopidogrel  (PLAVIX ) tablet 75 mg  75 mg Per Tube Daily Von Bellis, MD   75 mg at 09/26/24 0826   cyanocobalamin (VITAMIN B12) injection 1,000 mcg  1,000 mcg Intramuscular Daily Von Bellis, MD   1,000 mcg at 09/26/24 9176   Followed by   NOREEN ON 10/02/2024] cyanocobalamin (VITAMIN B12) tablet 1,000 mcg  1,000 mcg Per Tube Daily Von Bellis, MD       NOREEN ON 09/27/2024] DAPTOmycin (CUBICIN) 600 mg in sodium chloride  0.9 % IVPB  600 mg Intravenous Q48H Von Bellis, MD       feeding supplement (OSMOLITE 1.5 CAL) liquid 1,000 mL  1,000 mL Per Tube Continuous Von Bellis, MD 35 mL/hr at 09/26/24 0707 Rate Change at  09/26/24 0707   feeding supplement (PROSource TF20) liquid 60 mL  60 mL Per Tube Daily Von Bellis, MD   60 mL at 09/26/24 9167   folic acid  (FOLVITE ) tablet 1 mg  1 mg Per Tube Daily Von Bellis, MD   1 mg at 09/26/24 9173   free water  30 mL  30 mL Per Tube Q4H Von Bellis, MD   30 mL at 09/26/24 0826   guaiFENesin  (ROBITUSSIN) 100 MG/5ML liquid 10 mL  10 mL Per Tube Q4H Von Bellis, MD   10 mL at 09/26/24 1043   heparin  injection 5,000 Units  5,000 Units Subcutaneous Q8H Von Bellis, MD   5,000 Units at 09/26/24 9377   ipratropium-albuterol  (DUONEB) 0.5-2.5 (3) MG/3ML nebulizer solution 3 mL  3 mL Inhalation Q4H PRN Mansy, Jan A, MD       lactulose  (CHRONULAC ) 10 GM/15ML solution 20 g  20 g Per Tube BID PRN Von Bellis, MD       levETIRAcetam  (KEPPRA ) tablet 500 mg  500 mg Oral Q24H Von Bellis, MD   500 mg at 09/24/24 9047   Or   levETIRAcetam  (KEPPRA ) undiluted injection 500 mg  500 mg Intravenous Q24H Von Bellis, MD   500 mg at 09/26/24 0818   multivitamin with minerals tablet 1 tablet  1 tablet Per Tube Daily Von Bellis, MD   1 tablet at 09/26/24 0825   mupirocin  ointment (BACTROBAN ) 2 % 1 Application  1 Application Nasal BID Von Bellis, MD   1 Application at 09/26/24 1044   omeprazole (KONVOMEP) 2 mg/mL oral suspension 20 mg  20 mg Per Tube BID Von Bellis, MD   20 mg at 09/26/24 0949   ondansetron  (ZOFRAN ) tablet 4 mg  4 mg Per Tube Q6H PRN Von Bellis, MD  Or   ondansetron  (ZOFRAN ) injection 4 mg  4 mg Intravenous Q6H PRN Von Bellis, MD       Oral care mouth rinse  15 mL Mouth Rinse 4 times per day Von Bellis, MD   15 mL at 09/26/24 9167   Oral care mouth rinse  15 mL Mouth Rinse PRN Von Bellis, MD       senna-docusate (Senokot-S) tablet 2 tablet  2 tablet Per Tube QHS Von Bellis, MD       sertraline  (ZOLOFT ) tablet 175 mg  175 mg Per Tube Daily Von Bellis, MD   175 mg at 09/26/24 0825   sodium bicarbonate  75 mEq in sodium chloride  0.45 %  1,075 mL infusion   Intravenous Continuous Von Bellis, MD 125 mL/hr at 09/26/24 1048 New Bag at 09/26/24 1048   thiamine  (VITAMIN B1) tablet 100 mg  100 mg Per Tube Daily Von Bellis, MD   100 mg at 09/26/24 0826   [START ON 09/29/2024] Vitamin D (Ergocalciferol) (DRISDOL) 1.25 MG (50000 UNIT) capsule 50,000 Units  50,000 Units Oral Q7 days Mansy, Jan A, MD       zinc sulfate (50mg  elemental zinc) capsule 220 mg  220 mg Per Tube Daily Von Bellis, MD         Abtx:  Anti-infectives (From admission, onward)    Start     Dose/Rate Route Frequency Ordered Stop   09/27/24 1400  DAPTOmycin (CUBICIN) 600 mg in sodium chloride  0.9 % IVPB        600 mg 124 mL/hr over 30 Minutes Intravenous Every 48 hours 09/26/24 0712     09/26/24 1400  DAPTOmycin (CUBICIN) 600 mg in sodium chloride  0.9 % IVPB  Status:  Discontinued        600 mg 124 mL/hr over 30 Minutes Intravenous Every 48 hours 09/25/24 0936 09/26/24 0712   09/25/24 1400  DAPTOmycin (CUBICIN) 600 mg in sodium chloride  0.9 % IVPB  Status:  Discontinued        8 mg/kg  77 kg 124 mL/hr over 30 Minutes Intravenous Every 48 hours 09/25/24 0921 09/25/24 0923   09/24/24 0115  vancomycin  (VANCOREADY) IVPB 1750 mg/350 mL        1,750 mg 175 mL/hr over 120 Minutes Intravenous  Once 09/24/24 0104 09/24/24 0505   09/23/24 1800  cefTRIAXone  (ROCEPHIN ) 2 g in sodium chloride  0.9 % 100 mL IVPB        2 g 200 mL/hr over 30 Minutes Intravenous Every 24 hours 09/23/24 1446     09/23/24 1448  vancomycin  variable dose per unstable renal function (pharmacist dosing)  Status:  Discontinued         Does not apply See admin instructions 09/23/24 1448 09/25/24 0921   09/22/24 1915  vancomycin  (VANCOREADY) IVPB 1750 mg/350 mL        1,750 mg 175 mL/hr over 120 Minutes Intravenous  Once 09/22/24 1849 09/23/24 0113   09/22/24 1845  ceFEPIme  (MAXIPIME ) 2 g in sodium chloride  0.9 % 100 mL IVPB        2 g 200 mL/hr over 30 Minutes Intravenous  Once 09/22/24 1840  09/22/24 1923   09/22/24 1845  vancomycin  (VANCOCIN ) IVPB 1000 mg/200 mL premix  Status:  Discontinued        1,000 mg 200 mL/hr over 60 Minutes Intravenous  Once 09/22/24 1840 09/22/24 1848       REVIEW OF SYSTEMS:  Const: negative fever, negative chills, negative weight loss Eyes: negative diplopia or visual  changes, negative eye pain ENT: negative coryza, negative sore throat Resp: negative cough, hemoptysis, dyspnea Cards: negative for chest pain, palpitations, lower extremity edema GU: negative for frequency, dysuria and hematuria GI: Negative for abdominal pain, diarrhea, bleeding, constipation Skin: negative for rash and pruritus Heme: negative for easy bruising and gum/nose bleeding MS: negative for myalgias, arthralgias, back pain and muscle weakness Neurolo:negative for headaches, dizziness, vertigo, memory problems  Psych: negative for feelings of anxiety, depression  Endocrine: negative for thyroid, diabetes Allergy/Immunology- negative for any medication or food allergies ? Pertinent Positives include : Objective:  VITALS:  BP 127/72 (BP Location: Right Arm)   Pulse 70   Temp 98.8 F (37.1 C)   Resp 16   Ht 5' 9 (1.753 m)   Wt 80.6 kg   SpO2 95%   BMI 26.24 kg/m  LDA Foley Central line Other drainage tubes PHYSICAL EXAM:  General: Alert, cooperative, no distress, appears stated age.  Head: Normocephalic, without obvious abnormality, atraumatic. Eyes: Conjunctivae clear, anicteric sclerae. Pupils are equal ENT Nares normal. No drainage or sinus tenderness. Lips, mucosa, and tongue normal. No Thrush Neck: Supple, symmetrical, no adenopathy, thyroid: non tender no carotid bruit and no JVD. Back: No CVA tenderness. Lungs: Clear to auscultation bilaterally. No Wheezing or Rhonchi. No rales. Heart: Regular rate and rhythm, no murmur, rub or gallop. Abdomen: Soft, non-tender,not distended. Bowel sounds normal. No masses Extremities: atraumatic, no cyanosis.  No edema. No clubbing Skin: No rashes or lesions. Or bruising Lymph: Cervical, supraclavicular normal. Neurologic: Grossly non-focal Pertinent Labs Lab Results CBC    Component Value Date/Time   WBC 5.9 09/26/2024 0320   RBC 3.24 (L) 09/26/2024 0320   HGB 9.8 (L) 09/26/2024 0320   HCT 30.0 (L) 09/26/2024 0320   PLT 122 (L) 09/26/2024 0320   MCV 92.6 09/26/2024 0320   MCH 30.2 09/26/2024 0320   MCHC 32.7 09/26/2024 0320   RDW 12.9 09/26/2024 0320   LYMPHSABS 1.4 09/22/2024 1758   MONOABS 0.7 09/22/2024 1758   EOSABS 0.0 09/22/2024 1758   BASOSABS 0.0 09/22/2024 1758       Latest Ref Rng & Units 09/26/2024    3:20 AM 09/25/2024    2:11 PM 09/25/2024    3:10 AM  CMP  Glucose 70 - 99 mg/dL 891  72  62   BUN 8 - 23 mg/dL 897  894  888   Creatinine 0.61 - 1.24 mg/dL 4.95  4.52  4.29   Sodium 135 - 145 mmol/L 145  143  142   Potassium 3.5 - 5.1 mmol/L 3.9  4.5  4.2   Chloride 98 - 111 mmol/L 102  101  100   CO2 22 - 32 mmol/L 20  16  17    Calcium  8.9 - 10.3 mg/dL 8.0  8.3  7.8       Microbiology: Recent Results (from the past 240 hours)  Culture, blood (routine x 2)     Status: Abnormal   Collection Time: 09/22/24  5:37 PM   Specimen: BLOOD LEFT HAND  Result Value Ref Range Status   Specimen Description   Final    BLOOD LEFT HAND Performed at [facility], [address] [place], [place] [address]    Special Requests   Final    BOTTLES DRAWN AEROBIC AND ANAEROBIC Blood Culture results may not be optimal due to an inadequate volume of blood received in culture bottles Performed at [facility], [address] [place], [place] [address]  Culture  Setup Time   Final    GRAM POSITIVE COCCI AEROBIC BOTTLE ONLY CRITICAL VALUE NOTED.  VALUE IS CONSISTENT WITH PREVIOUSLY REPORTED AND CALLED VALUE. Performed at [facility], [address] [place], [place] [address]    Culture STAPHYLOCOCCUS EPIDERMIDIS (A)  Final   Report Status 09/25/2024  FINAL  Final   Organism ID, Bacteria STAPHYLOCOCCUS EPIDERMIDIS  Final      Susceptibility   Staphylococcus epidermidis - MIC*    CIPROFLOXACIN 4 RESISTANT Resistant     ERYTHROMYCIN >=8 RESISTANT Resistant     GENTAMICIN  >=16 RESISTANT Resistant     OXACILLIN >=4 RESISTANT Resistant     TETRACYCLINE <=1 SENSITIVE Sensitive     VANCOMYCIN  2 SENSITIVE Sensitive     TRIMETH/SULFA 160 RESISTANT Resistant     CLINDAMYCIN >=8 RESISTANT Resistant     RIFAMPIN <=0.5 SENSITIVE Sensitive     Inducible Clindamycin NEGATIVE Sensitive     * STAPHYLOCOCCUS EPIDERMIDIS  Culture, blood (routine x 2)     Status: Abnormal   Collection Time: 09/22/24  5:57 PM   Specimen: Right Antecubital; Blood  Result Value Ref Range Status   Specimen Description   Final    RIGHT ANTECUBITAL Performed at [facility], [address] [place], [place] [address]    Special Requests   Final    BOTTLES DRAWN AEROBIC AND ANAEROBIC Blood Culture results may not be optimal due to an inadequate volume of blood received in culture bottles Performed at [facility], [address] [place], [place] [address]    Culture  Setup Time   Final    GRAM POSITIVE COCCI ANAEROBIC BOTTLE ONLY CRITICAL RESULT CALLED TO, READ BACK BY AND VERIFIED WITH: EMILY STEINBOCK 09/23/24 1424 KLW    Culture STAPHYLOCOCCUS EPIDERMIDIS (A)  Final   Report Status 09/25/2024 FINAL  Final   Organism ID, Bacteria STAPHYLOCOCCUS EPIDERMIDIS  Final      Susceptibility   Staphylococcus epidermidis - MIC*    CIPROFLOXACIN <=0.5 SENSITIVE Sensitive     ERYTHROMYCIN >=8 RESISTANT Resistant     GENTAMICIN  >=16 RESISTANT Resistant     OXACILLIN >=4 RESISTANT Resistant     TETRACYCLINE <=1 SENSITIVE Sensitive     VANCOMYCIN  1 SENSITIVE Sensitive     TRIMETH/SULFA 160 RESISTANT Resistant     CLINDAMYCIN <=0.25 SENSITIVE Sensitive     RIFAMPIN <=0.5 SENSITIVE Sensitive     Inducible Clindamycin NEGATIVE Sensitive     *  STAPHYLOCOCCUS EPIDERMIDIS  Blood Culture ID Panel (Reflexed)     Status: Abnormal   Collection Time: 09/22/24  5:57 PM  Result Value Ref Range Status   Enterococcus faecalis NOT DETECTED NOT DETECTED Final   Enterococcus Faecium NOT DETECTED NOT DETECTED Final   Listeria monocytogenes NOT DETECTED NOT DETECTED Final   Staphylococcus species DETECTED (A) NOT DETECTED Final    Comment: CRITICAL RESULT CALLED TO, READ BACK BY AND VERIFIED WITH: EMILY STEINBOCK 09/23/24 1424 KLW    Staphylococcus aureus (BCID) NOT DETECTED NOT DETECTED Final   Staphylococcus epidermidis DETECTED (A) NOT DETECTED Final    Comment: Methicillin (oxacillin) resistant coagulase negative staphylococcus. Possible blood culture contaminant (unless isolated from more than one blood culture draw or clinical case suggests pathogenicity). No antibiotic treatment is indicated for blood  culture contaminants. CRITICAL RESULT CALLED TO, READ BACK BY AND VERIFIED WITH: EMILY STEINBOCK 09/23/24 1424 KLW    Staphylococcus lugdunensis NOT DETECTED NOT DETECTED Final   Streptococcus species NOT DETECTED NOT DETECTED Final  Streptococcus agalactiae NOT DETECTED NOT DETECTED Final   Streptococcus pneumoniae NOT DETECTED NOT DETECTED Final   Streptococcus pyogenes NOT DETECTED NOT DETECTED Final   A.calcoaceticus-baumannii NOT DETECTED NOT DETECTED Final   Bacteroides fragilis NOT DETECTED NOT DETECTED Final   Enterobacterales NOT DETECTED NOT DETECTED Final   Enterobacter cloacae complex NOT DETECTED NOT DETECTED Final   Escherichia coli NOT DETECTED NOT DETECTED Final   Klebsiella aerogenes NOT DETECTED NOT DETECTED Final   Klebsiella oxytoca NOT DETECTED NOT DETECTED Final   Klebsiella pneumoniae NOT DETECTED NOT DETECTED Final   Proteus species NOT DETECTED NOT DETECTED Final   Salmonella species NOT DETECTED NOT DETECTED Final   Serratia marcescens NOT DETECTED NOT DETECTED Final   Haemophilus influenzae NOT DETECTED  NOT DETECTED Final   Neisseria meningitidis NOT DETECTED NOT DETECTED Final   Pseudomonas aeruginosa NOT DETECTED NOT DETECTED Final   Stenotrophomonas maltophilia NOT DETECTED NOT DETECTED Final   Candida albicans NOT DETECTED NOT DETECTED Final   Candida auris NOT DETECTED NOT DETECTED Final   Candida glabrata NOT DETECTED NOT DETECTED Final   Candida krusei NOT DETECTED NOT DETECTED Final   Candida parapsilosis NOT DETECTED NOT DETECTED Final   Candida tropicalis NOT DETECTED NOT DETECTED Final   Cryptococcus neoformans/gattii NOT DETECTED NOT DETECTED Final   Methicillin resistance mecA/C DETECTED (A) NOT DETECTED Final    Comment: CRITICAL RESULT CALLED TO, READ BACK BY AND VERIFIED WITH: EMILY STEINBOCK 09/23/24 1424 KLW Performed at [facility], [address] [place], [place] [address]   Resp panel by RT-PCR (RSV, Flu A&B, Covid) Anterior Nasal Swab     Status: None   Collection Time: 09/22/24  5:58 PM   Specimen: Anterior Nasal Swab  Result Value Ref Range Status   SARS Coronavirus 2 by RT PCR NEGATIVE NEGATIVE Final    Comment: (NOTE) SARS-CoV-2 target nucleic acids are NOT DETECTED.  The SARS-CoV-2 RNA is generally detectable in upper respiratory specimens during the acute phase of infection. The lowest concentration of SARS-CoV-2 viral copies this assay can detect is 138 copies/mL. A negative result does not preclude SARS-Cov-2 infection and should not be used as the sole basis for treatment or other patient management decisions. A negative result may occur with  improper specimen collection/handling, submission of specimen other than nasopharyngeal swab, presence of viral mutation(s) within the areas targeted by this assay, and inadequate number of viral copies(<138 copies/mL). A negative result must be combined with clinical observations, patient history, and epidemiological information. The expected result is Negative.  Fact Sheet for Patients:   bloggercourse.com  Fact Sheet for Healthcare Providers:  seriousbroker.it  This test is no t yet approved or cleared by the [place]  FDA and  has been authorized for detection and/or diagnosis of SARS-CoV-2 by FDA under an Emergency Use Authorization (EUA). This EUA will remain  in effect (meaning this test can be used) for the duration of the COVID-19 declaration under Section 564(b)(1) of the Act, 21 U.S.C.section 360bbb-3(b)(1), unless the authorization is terminated  or revoked sooner.       Influenza A by PCR NEGATIVE NEGATIVE Final   Influenza B by PCR NEGATIVE NEGATIVE Final    Comment: (NOTE) The Xpert Xpress SARS-CoV-2/FLU/RSV plus assay is intended as an aid in the diagnosis of influenza from Nasopharyngeal swab specimens and should not be used as a sole basis for treatment. Nasal washings and aspirates are unacceptable for Xpert Xpress SARS-CoV-2/FLU/RSV testing.  Fact Sheet for Patients: bloggercourse.com  Fact  Sheet for Healthcare Providers: seriousbroker.it  This test is not yet approved or cleared by the [place]  FDA and has been authorized for detection and/or diagnosis of SARS-CoV-2 by FDA under an Emergency Use Authorization (EUA). This EUA will remain in effect (meaning this test can be used) for the duration of the COVID-19 declaration under Section 564(b)(1) of the Act, 21 U.S.C. section 360bbb-3(b)(1), unless the authorization is terminated or revoked.     Resp Syncytial Virus by PCR NEGATIVE NEGATIVE Final    Comment: (NOTE) Fact Sheet for Patients: bloggercourse.com  Fact Sheet for Healthcare Providers: seriousbroker.it  This test is not yet approved or cleared by the [place]  FDA and has been authorized for detection and/or diagnosis of SARS-CoV-2 by FDA under an Emergency Use  Authorization (EUA). This EUA will remain in effect (meaning this test can be used) for the duration of the COVID-19 declaration under Section 564(b)(1) of the Act, 21 U.S.C. section 360bbb-3(b)(1), unless the authorization is terminated or revoked.  Performed at [facility], [address] [place], [place] [address]   Culture, blood (Routine X 2) w Reflex to ID Panel     Status: None (Preliminary result)   Collection Time: 09/24/24  6:18 PM   Specimen: BLOOD  Result Value Ref Range Status   Specimen Description BLOOD BLOOD RIGHT HAND  Final   Special Requests   Final    BOTTLES DRAWN AEROBIC AND ANAEROBIC Blood Culture adequate volume   Culture   Final    NO GROWTH 2 DAYS Performed at [facility], [address], [place], [place] [address]    Report Status PENDING  Incomplete  MRSA Next Gen by PCR, Nasal     Status: Abnormal   Collection Time: 09/24/24  6:18 PM   Specimen: Nasal Mucosa; Nasal Swab  Result Value Ref Range Status   MRSA by PCR Next Gen DETECTED (A) NOT DETECTED Final    Comment: RESULT CALLED TO, READ BACK BY AND VERIFIED WITH:  GEORGINA OROZCO AT 2345 09/24/24 JG (NOTE) The GeneXpert MRSA Assay (FDA approved for NASAL specimens only), is one component of a comprehensive MRSA colonization surveillance program. It is not intended to diagnose MRSA infection nor to guide or monitor treatment for MRSA infections. Test performance is not FDA approved in patients less than 40 years old. Performed at [facility], [address] [place], [place] [address]   Culture, blood (Routine X 2) w Reflex to ID Panel     Status: None (Preliminary result)   Collection Time: 09/24/24  9:16 PM   Specimen: BLOOD  Result Value Ref Range Status   Specimen Description BLOOD BLOOD RIGHT HAND  Final   Special Requests   Final    BOTTLES DRAWN AEROBIC AND ANAEROBIC Blood Culture results may not be optimal due to an inadequate volume of blood received  in culture bottles   Culture   Final    NO GROWTH 2 DAYS Performed at [facility], [address], [place], [place] [address]    Report Status PENDING  Incomplete    IMAGING RESULTS: MRI brain- rt MC territory encephalomalacia  I have personally reviewed the films  ?no infiltrate Impression/Recommendation ? Acute metabolic encephalopathy- due to uremia/AKI Myoclonic jerks due to that May benefit from short term dialysis   AKI on CKD- unclear etiology for worsening H/o left hydronephrosis, calculi and lithotripsy US  kidneys done Check bladder scan to make sure no incomplete emptying  UTI Pyelonephritis No urine  culture sent but has pyuria- because of h/o ESBL ecoli will change ceftriaxone  to ertapenem   Staph epidermidis- 2 sets- ? 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 _I have personally spent  --75-minutes involved in face-to-face and non-face-to-face activities for this patient on the day of the visit. Professional time spent includes the following activities: Preparing to see the patient (review of tests), Obtaining and/or reviewing separately obtained history (admission/discharge record), Performing a medically appropriate examination and/or evaluation , Ordering medications/tests/procedures, referring and communicating with other health care professionals, Documenting clinical information in the EMR, Independently interpreting results (not separately reported), ted).     ________________________________________________     [1] No Known Allergies

## 2024-09-26 NOTE — Care Management Important Message (Signed)
 Important Message  Patient Details  Name: Rodney Estrada MRN: [id number] Date of Birth: 10-Jan-1951   Important Message Given:  Yes - Medicare IM     [name] 09/26/2024, 12:39 PM

## 2024-09-27 DIAGNOSIS — J9601 Acute respiratory failure with hypoxia: Secondary | ICD-10-CM | POA: Diagnosis not present

## 2024-09-27 DIAGNOSIS — Z515 Encounter for palliative care: Secondary | ICD-10-CM | POA: Diagnosis not present

## 2024-09-27 DIAGNOSIS — G9349 Other encephalopathy: Secondary | ICD-10-CM

## 2024-09-27 DIAGNOSIS — N390 Urinary tract infection, site not specified: Secondary | ICD-10-CM | POA: Diagnosis not present

## 2024-09-27 DIAGNOSIS — G9341 Metabolic encephalopathy: Secondary | ICD-10-CM | POA: Diagnosis not present

## 2024-09-27 LAB — CBC
HCT: 29.7 % — ABNORMAL LOW (ref 39.0–52.0)
Hemoglobin: 9.7 g/dL — ABNORMAL LOW (ref 13.0–17.0)
MCH: 29.8 pg (ref 26.0–34.0)
MCHC: 32.7 g/dL (ref 30.0–36.0)
MCV: 91.4 fL (ref 80.0–100.0)
Platelets: 120 K/uL — ABNORMAL LOW (ref 150–400)
RBC: 3.25 MIL/uL — ABNORMAL LOW (ref 4.22–5.81)
RDW: 12.9 % (ref 11.5–15.5)
WBC: 5.6 K/uL (ref 4.0–10.5)
nRBC: 0 % (ref 0.0–0.2)

## 2024-09-27 LAB — BASIC METABOLIC PANEL WITH GFR
Anion gap: 15 (ref 5–15)
Anion gap: 14 (ref 5–15)
BUN: 84 mg/dL — ABNORMAL HIGH (ref 8–23)
BUN: 79 mg/dL — ABNORMAL HIGH (ref 8–23)
CO2: 27 mmol/L (ref 22–32)
CO2: 27 mmol/L (ref 22–32)
Calcium: 8.1 mg/dL — ABNORMAL LOW (ref 8.9–10.3)
Calcium: 7.7 mg/dL — ABNORMAL LOW (ref 8.9–10.3)
Chloride: 105 mmol/L (ref 98–111)
Chloride: 107 mmol/L (ref 98–111)
Creatinine, Ser: 4.16 mg/dL — ABNORMAL HIGH (ref 0.61–1.24)
Creatinine, Ser: 3.63 mg/dL — ABNORMAL HIGH (ref 0.61–1.24)
GFR, Estimated: 14 mL/min — ABNORMAL LOW
GFR, Estimated: 17 mL/min — ABNORMAL LOW
Glucose, Bld: 188 mg/dL — ABNORMAL HIGH (ref 70–99)
Glucose, Bld: 217 mg/dL — ABNORMAL HIGH (ref 70–99)
Potassium: 3.1 mmol/L — ABNORMAL LOW (ref 3.5–5.1)
Potassium: 3.1 mmol/L — ABNORMAL LOW (ref 3.5–5.1)
Sodium: 146 mmol/L — ABNORMAL HIGH (ref 135–145)
Sodium: 149 mmol/L — ABNORMAL HIGH (ref 135–145)

## 2024-09-27 LAB — GLUCOSE, CAPILLARY
Glucose-Capillary: 127 mg/dL — ABNORMAL HIGH (ref 70–99)
Glucose-Capillary: 149 mg/dL — ABNORMAL HIGH (ref 70–99)
Glucose-Capillary: 179 mg/dL — ABNORMAL HIGH (ref 70–99)
Glucose-Capillary: 186 mg/dL — ABNORMAL HIGH (ref 70–99)
Glucose-Capillary: 197 mg/dL — ABNORMAL HIGH (ref 70–99)
Glucose-Capillary: 219 mg/dL — ABNORMAL HIGH (ref 70–99)

## 2024-09-27 LAB — MAGNESIUM: Magnesium: 2.2 mg/dL (ref 1.7–2.4)

## 2024-09-27 LAB — VANCOMYCIN, RANDOM: Vancomycin Rm: 21 ug/mL

## 2024-09-27 LAB — CK: Total CK: 261 U/L (ref 49–397)

## 2024-09-27 LAB — PHOSPHORUS: Phosphorus: 3.5 mg/dL (ref 2.5–4.6)

## 2024-09-27 MED ORDER — LEVETIRACETAM (KEPPRA) 500 MG/5 ML ADULT IV PUSH
500.0000 mg | INTRAVENOUS | Status: DC
  Administered 2024-10-03: 500 mg via INTRAVENOUS
  Filled 2024-09-27 (×2): qty 5

## 2024-09-27 MED ORDER — KCL IN DEXTROSE-NACL 40-5-0.45 MEQ/L-%-% IV SOLN
INTRAVENOUS | Status: DC
  Filled 2024-09-27 (×2): qty 1000

## 2024-09-27 MED ORDER — LEVETIRACETAM 500 MG PO TABS
500.0000 mg | ORAL_TABLET | ORAL | Status: DC
  Administered 2024-09-27 – 2024-10-02 (×6): 500 mg
  Filled 2024-09-27 (×6): qty 1

## 2024-09-27 MED ORDER — INSULIN ASPART 100 UNIT/ML IJ SOLN
0.0000 [IU] | INTRAMUSCULAR | Status: DC
  Administered 2024-09-27: 1 [IU] via SUBCUTANEOUS
  Administered 2024-09-27: 3 [IU] via SUBCUTANEOUS
  Administered 2024-09-27: 1 [IU] via SUBCUTANEOUS
  Administered 2024-09-27 – 2024-09-28 (×2): 2 [IU] via SUBCUTANEOUS
  Administered 2024-09-28: 1 [IU] via SUBCUTANEOUS
  Administered 2024-09-28 – 2024-09-29 (×7): 2 [IU] via SUBCUTANEOUS
  Administered 2024-09-29: 1 [IU] via SUBCUTANEOUS
  Administered 2024-09-29: 2 [IU] via SUBCUTANEOUS
  Administered 2024-09-30 – 2024-10-01 (×6): 1 [IU] via SUBCUTANEOUS
  Administered 2024-10-01: 2 [IU] via SUBCUTANEOUS
  Administered 2024-10-01 – 2024-10-02 (×4): 1 [IU] via SUBCUTANEOUS
  Filled 2024-09-27: qty 1
  Filled 2024-09-27: qty 3
  Filled 2024-09-27: qty 2
  Filled 2024-09-27: qty 3
  Filled 2024-09-27 (×2): qty 1
  Filled 2024-09-27: qty 2
  Filled 2024-09-27 (×2): qty 1
  Filled 2024-09-27: qty 2
  Filled 2024-09-27: qty 1
  Filled 2024-09-27 (×3): qty 2
  Filled 2024-09-27 (×4): qty 1
  Filled 2024-09-27 (×3): qty 2
  Filled 2024-09-27: qty 1
  Filled 2024-09-27 (×2): qty 2
  Filled 2024-09-27: qty 1
  Filled 2024-09-27: qty 2

## 2024-09-27 MED ORDER — SODIUM CHLORIDE 0.9 % IV SOLN
600.0000 mg | INTRAVENOUS | Status: DC
  Administered 2024-09-28: 600 mg via INTRAVENOUS
  Filled 2024-09-27: qty 12

## 2024-09-27 MED ORDER — FREE WATER
150.0000 mL | Status: DC
  Administered 2024-09-27 – 2024-09-28 (×6): 150 mL

## 2024-09-27 NOTE — Progress Notes (Signed)
 "                                                                                                                                                                                                          Daily Progress Note   Patient Name: Rodney Estrada       Date: 09/27/2024 DOB: August 30, 1951  Age: 74 y.o. MRN#: [id number] Attending Physician: Von Bellis, MD Primary Care Physician: Pcp, No Admit Date: 09/22/2024  Reason for Consultation/Follow-up: Establishing goals of care  HPI/Brief Hospital Review:  74 y.o. male  with past medical history of CAD, CHF, HTN, HLD, A. Fib, T2DM and CVA with left sided hemiparesis admitted from [facility] on 09/22/2024 with increased confusion and decreased responsiveness reported by facility staff, symptoms first noticed Friday prior to admission on Sunday.   Admitted and being treated for UTI, severe AKI, blood cultures positive for staph epidermis possible contamination, and elevated troponin levels--echo ordered   Palliative medicine was consulted for assisting with goals of care conversations.  Subjective: Extensive chart review has been completed prior to meeting patient including labs, vital signs, imaging, progress notes, orders, and available advanced directive documents from current and previous encounters.    Visited with Rodney Estrada at his bedside. He is resting in bed with eyes closed. He is able to open his eyes briefly to calling of his name, able to follow simple commands and nodded his head appropriately to simple questions. He easily drifts back to sleep without constant redirection. He denies acute pain or discomfort. Briefly explained to him reason for hospitalization and treatments.  Renal function continues to improve. No indication for HD at this time per nephrology.  Called and spoke with Rodney Estrada, provided medical updates. Rodney Estrada is hopeful for continued improvement. She is planning to attempt a visit at bedside tomorrow depending on  how she is feeling.  Answered and addressed all questions and concerns. PMT to continue to follow for ongoing needs and support.  Objective:  Physical Exam Constitutional:      Appearance: He is ill-appearing.  HENT:     Head: Normocephalic.     Mouth/Throat:     Mouth: Mucous membranes are dry.  Pulmonary:     Effort: Pulmonary effort is normal. No respiratory distress.  Abdominal:     General: There is no distension.     Palpations: Abdomen is soft.  Skin:    General: Skin is warm and dry.     Coloration: Skin is pale.  Neurological:     Mental Status: He is easily aroused.  He is lethargic.     Motor: Weakness present.             Vital Signs: BP 127/62 (BP Location: Right Arm)   Pulse 63   Temp 97.8 F (36.6 C)   Resp 16   Ht 5' 9 (1.753 m)   Wt 81.8 kg   SpO2 96%   BMI 26.63 kg/m  SpO2: SpO2: 96 % O2 Device: O2 Device: Nasal Cannula O2 Flow Rate: O2 Flow Rate (L/min): 2 L/min   Palliative Care Assessment & Plan   Assessment/Recommendation/Plan  Continue current plan of care Time for continued outcomes  Thank you for allowing the Palliative Medicine Team to assist in the care of this patient  Visit includes: Detailed review of medical records (labs, imaging, vital signs), medically appropriate exam (mental status, respiratory, cardiac, skin), discussed with treatment team, counseling and educating patient, family and staff, documenting clinical information, medication management and coordination of care.  Waddell Lesches, DNP, AGNP-C Palliative Medicine   Please contact Palliative Medicine Team phone at [phone] for questions and concerns.   "

## 2024-09-27 NOTE — Progress Notes (Signed)
 Pharmacy Antibiotic Note  Rodney Estrada is a 74 y.o. male admitted on 09/22/2024 with bacteremia.  Pharmacy has been consulted for daptomycin  dosing.  Blood cultures showing MRSE in 2/2 sets (2/4 total bottles) from 12/29 Bcx. Sensitivities for MRSE show different resistance patterns. ID is following and questioning contaminants.  Assessment/Plan:  Per ID note, transition to daptomycin  once vancomycin  level is < 15. Daptomycin  level today is 21 so anticipate that will be 09/29/23  Plan to start daptomycin  600 mg IV q48h on 09/29/23 at 1400  Per ID note, patient may not need this therapy given repeat Bcx from 12/31 remain negative   Height: 5' 9 (175.3 cm) Weight: 81.8 kg (180 lb 5.4 oz) IBW/kg (Calculated) : 70.7  Temp (24hrs), Avg:98.5 F (36.9 C), Min:97.9 F (36.6 C), Max:99.5 F (37.5 C)  Recent Labs  Lab 09/22/24 1758 09/23/24 0628 09/23/24 2345 09/24/24 0445 09/24/24 1820 09/24/24 2116 09/25/24 0310 09/25/24 1411 09/26/24 0320 09/27/24 0423  WBC 11.4* 10.0  --  18.4*  --   --  8.6  --  5.9 5.6  CREATININE 5.40* 5.33*  --  6.31*  --   --  5.70* 5.47* 5.04* 4.16*  LATICACIDVEN 1.0  --   --   --  0.8 0.9  --   --   --   --   VANCORANDOM  --   --    < >  --   --   --  33  --  28 21   < > = values in this interval not displayed.    Estimated Creatinine Clearance: 16.1 mL/min (A) (by C-G formula based on SCr of 4.16 mg/dL (H)).    Allergies[1]  Thank you for allowing pharmacy to be a part of this patients care.  Marolyn KATHEE Mare 09/27/2024    [1] No Known Allergies

## 2024-09-27 NOTE — Progress Notes (Signed)
 [facility] Progress Note  Patient: Rodney Estrada    FMW:[id number]  DOA: 09/22/2024     Date of Service: the patient was seen and examined on 09/27/2024  No chief complaint on file.  Brief hospital course:  from H&P - Rodney Estrada is a 74 y.o. male with medical history significant for coronary artery disease, chronic systolic CHF, gout, hypertension, dyslipidemia, atrial fibrillation and CVA as well as type 2 diabetes mellitus who presented to the emergency room 12/29 with acute onset of altered mental status with confusion and decreased responsiveness since Friday 12/26 at his SNF.  He has been nonverbal with increased tremors.  No reported fever or chills.  SNF staff noted congested cough and therefore the patient was given IM Rocephin  this afternoon.  The patient was nonverbal during admitting hospitalist's interview and would answer some questions nodding his head.  No chest pain or palpitations were reported.  No reported nausea or vomiting or diarrhea or abdominal pain.     12/29: to ED. (+)UTI, started on IV fluids, vanc, cefepime . Admitted to hospitalist for acute encephalopathy d/t UTI, AKI on CKD 3a assoc w/ hyperkalemia. Concern w/ elevation troponins, heparin  gtt started.  12/30: holding heparin  gtt given hematuria. Cardiology following, in agreement. Echo pending. Pt remains confused. Echo pending.          Consultants:  Cardiology signed off on 12/31 Nephrology   Procedures/Surgeries: None   Assessment and Plan:  # Severe sepsis secondary to UTI Leukocytosis, tachycardia, tachypneic and hypotensive on 12/31 Patient responded well to IV fluid, did not require Levophed  Transferred to ICU, lactic acid within normal range and vitals improved after IV fluid so transferred to progressive unit.  cortisol level wnl S/p vancomycin , trough level was high, DC'd S/p ceftriaxone  till 1/2 12/31 NS 500 mL bolus and LR 500 bolus, 1/1 LR 500 mL bolus PCCM consulted, LR 500 bolus  ordered, if no improvement then patient will need Levophed . 1/2 started Invanz  as per ID 1/3 started daptomycin  as per ID, pharmacy consulted for dose  # Acute metabolic encephalopathy secondary to uremia MRI brain pending radiology read - appears stable from previous on personal review  Continue neurochecks  keppra  levels elevated, pharmacy consulted for dose and monitor Hold gabapentin  (300 mg tid home dose)    # AKI on CKD stage III # Metabolic encephalopathy secondary to uremia # Metabolic acidosis History of chronic left hydronephrosis  history of kidney stones and left ureteral stent placement and removal in February 2024 Bicarbonate 100 mEq IV one-time push given Started bicarb IV infusion Nephrology consulted for urgent hemodialysis Palliative care consulted, as per nephro patient may not be able to get hemodialysis done as an outpatient due to comorbidities. 1/2 discussed with nephrology again today to start hemodialysis as patient's mental status is not improving due to uremia Nephrology will revisit tomorrow and consider hemodialysis tomorrow a.m. if needed 1/3 metabolic acidosis resolved, bicarb infusion discontinued.  Renal functions are improving, nephrology recommended no need of urgent hemodialysis and continue to monitor. Started free water  due to elevated sodium   # Acute lower UTI S/p IV Rocephin   1/1 repeated urine cultures negative 1/2 started ertapenem  1/3 started daptomycin    # Staph epidermidis blood culture positive Possible contamination Urine culture negative S/p vancomycin , DC'd due to renal failure 1/2 started Invanz  1/3 started daptomycin  ID consult appreciated    # Elevated troponin / Type II  likely demand ischemia  S/p IV heparin  dc TTE LVEF >55, Left ventricular endocardial  border not optimally defined to evaluate regional wall motion, moderate LVH. Cardiology consulted recommended no intervention.  Signed off on 12/31     # Gross  hematuria: Resolved  Question due to UTI Monitor CBC   # Hyperkalemia: Resolved  D/t AKI Treat underlying cause(s) as noted  S/p lokelma . Follow BMP  # Hypokalemia 1/3 low potassium 3.1, repleted with IV fluid Monitor electrolytes daily    # Essential hypertension but low BP due to sepsis Hold amlodipine , lisinopril     # Coronary artery disease ASA, Plavix , statin Hold antiypertensives    Gout Hold allopurinol  w/ AKI   Depression Continue Zoloft  home dose 175 mg daily --> 150  Abilify     GERD without esophagitis PPI   Seizure disorder continue Keppra  Check keppra  levels    Constipation Bowel regimen per orders   Type 2 diabetes mellitus with peripheral neuropathy  Not on home antihyperglycemics No glucosuria Holding SSI Monitor Glc achs for now and initiate insulin  if needed  A1C hold Neurontin  w/ AKI and encephalopathy   Prednisone rx outpatient started 12/26 20 mg daily NOT chronic medication Uncertain why but suspect as above he was treated for presumed respiratory illness at faciltiy - will dc this  cortisol level wnl  # Folic acid  deficiency: started folic acid  1 mg p.o. daily.  Follow-up with PCP to repeat folic acid  level in between 3 to 6 months.   Overall condition prognosis is very poor, patient may not survive. Critical care consulted, we will continue above management. Palliative care consulted for goals of care discussion.   Body mass index is 26.63 kg/m.  Interventions:  Diet: NPO/continue NG tube feeds DVT Prophylaxis: Subcutaneous Heparin     Advance goals of care discussion: DNR-limited  Family Communication: family was not present at bedside, at the time of interview.  The pt provided permission to discuss medical plan with the family. Opportunity was given to ask question and all questions were answered satisfactorily.  12/31 called and left a VM for his sister Rodney Estrada [phone]  09/25/24 I spoke to patient's  sister today and updated the management plan and his current situation, poor prognosis. 1/3 I spoke to patient's Rodney Estrada, and updated her regarding his brother's condition.   Disposition:  Pt is from SNF, admitted with sepsis, AMS, AKI, uremia, still critically ill, which precludes a safe discharge. Discharge to SNF, when stable, may need few days to improve.  Subjective: No significant events overnight.  Eyes remained closed, unable to respond to command, observed cough reflex due to phlegm. Order placed for intermittent suctioning.   Physical Exam: General: Remained obtunded Eyes: clossed ENT: Oral Mucosa Clear, and dry Neck: no JVD,  Cardiovascular: S1 and S2 Present, no Murmur,  Respiratory: good respiratory effort, Bilateral Air entry equal and Decreased, no Crackles, no wheezes Abdomen: Bowel Sound present, Soft and no tenderness,  Skin: no rashes Extremities: no Pedal edema, no calf tenderness Neurologic: Left-sided residual weakness due to prior CVA.  Patient is obtunded, unable to follow command   Vitals:   09/27/24 0440 09/27/24 0500 09/27/24 0851 09/27/24 1139  BP: 126/63  135/67 127/62  Pulse:   65 63  Resp: 19  17 16   Temp: 98.4 F (36.9 C)  98.2 F (36.8 C) 97.8 F (36.6 C)  TempSrc: Axillary     SpO2: 98%  95% 96%  Weight:  81.8 kg    Height:        Intake/Output Summary (Last 24 hours) at 09/27/2024 1314  Last data filed at 09/27/2024 1204 Gross per 24 hour  Intake 2937.63 ml  Output 1800 ml  Net 1137.63 ml   Filed Weights   09/22/24 2134 09/26/24 0458 09/27/24 0500  Weight: 77 kg 80.6 kg 81.8 kg    Data Reviewed: I have personally reviewed and interpreted daily labs, tele strips, imagings as discussed above. I reviewed all nursing notes, pharmacy notes, vitals, pertinent old records I have discussed plan of care as described above with RN and patient/family.  CBC: Recent Labs  Lab 09/22/24 1758 09/23/24 0628 09/24/24 0445 09/25/24 0310  09/26/24 0320 09/27/24 0423  WBC 11.4* 10.0 18.4* 8.6 5.9 5.6  NEUTROABS 9.1*  --   --   --   --   --   HGB 11.1* 11.2* 9.9* 9.9* 9.8* 9.7*  HCT 34.9* 36.1* 31.7* 31.0* 30.0* 29.7*  MCV 94.8 95.8 95.5 93.4 92.6 91.4  PLT 194 169 163 129* 122* 120*   Basic Metabolic Panel: Recent Labs  Lab 09/24/24 0445 09/24/24 0955 09/25/24 0310 09/25/24 1411 09/26/24 0320 09/27/24 0423  NA 140  --  142 143 145 146*  K 5.1  --  4.2 4.5 3.9 3.1*  CL 101  --  100 101 102 105  CO2 13*  --  17* 16* 20* 27  GLUCOSE 126*  --  62* 72 108* 188*  BUN 115*  --  111* 105* 102* 84*  CREATININE 6.31*  --  5.70* 5.47* 5.04* 4.16*  CALCIUM  8.4*  --  7.8* 8.3* 8.0* 8.1*  MG  --  2.6* 2.3  --  2.3 2.2  PHOS  --  8.3* 5.9*  --  4.6 3.5    Studies: DG Abd 1 View Result Date: 09/26/2024 CLINICAL DATA:  Nasogastric tube. EXAM: ABDOMEN - 1 VIEW COMPARISON:  Radiograph yesterday FINDINGS: Tip and side port of the enteric tube below the diaphragm in the stomach. Slight decreased gaseous bowel distention in the upper abdomen. No evidence of free air. IMPRESSION: Tip and side port of the enteric tube below the diaphragm in the stomach. Electronically Signed   By: Andrea Gasman M.D.   On: 09/26/2024 16:20    Scheduled Meds:  ARIPiprazole   2 mg Per Tube QHS   artificial tears  1 drop Both Eyes QHS   vitamin C   500 mg Per Tube BID   aspirin   81 mg Per Tube Daily   clopidogrel   75 mg Per Tube Daily   cyanocobalamin   1,000 mcg Intramuscular Daily   Followed by   NOREEN ON 10/02/2024] vitamin B-12  1,000 mcg Per Tube Daily   feeding supplement (PROSource TF20)  60 mL Per Tube Daily   folic acid   1 mg Per Tube Daily   free water   150 mL Per Tube Q4H   guaiFENesin   10 mL Per Tube Q4H   heparin  injection (subcutaneous)  5,000 Units Subcutaneous Q8H   insulin  aspart  0-9 Units Subcutaneous Q4H   levETIRAcetam   500 mg Per Tube Q24H   Or   levETIRAcetam   500 mg Intravenous Q24H   multivitamin with minerals  1 tablet  Per Tube Daily   mupirocin  ointment  1 Application Nasal BID   omeprazole   20 mg Per Tube BID   mouth rinse  15 mL Mouth Rinse 4 times per day   senna-docusate  2 tablet Per Tube QHS   sertraline   175 mg Per Tube Daily   thiamine   100 mg Per Tube Daily   [START ON  09/29/2024] Vitamin D  (Ergocalciferol )  50,000 Units Oral Q7 days   zinc  sulfate (50mg  elemental zinc )  220 mg Per Tube Daily   Continuous Infusions:  [START ON 09/28/2024] DAPTOmycin      dextrose  5 % and 0.45 % NaCl with KCl 40 mEq/L 75 mL/hr at 09/27/24 1152   ertapenem  500 mg (09/27/24 1157)   feeding supplement (OSMOLITE 1.5 CAL) 1,000 mL (09/27/24 0359)   PRN Meds: acetaminophen  **OR** acetaminophen , camphor-menthol , ipratropium-albuterol , lactulose , ondansetron  **OR** ondansetron  (ZOFRAN ) IV, mouth rinse  Time spent: 55 minutes  Author: ELVAN SOR. MD [facility] 09/27/2024 1:14 PM  To reach On-call, see care teams to locate the attending and reach out to them via [email or web address]. If 7PM-7AM, please contact night-coverage If you still have difficulty reaching the attending provider, please page the [facility] (Director on Call) for [facility] on amion for assistance.

## 2024-09-27 NOTE — Plan of Care (Signed)
  Problem: Clinical Measurements: Goal: Will remain free from infection Outcome: Progressing Goal: Respiratory complications will improve Outcome: Progressing Goal: Cardiovascular complication will be avoided Outcome: Progressing   Problem: Nutrition: Goal: Adequate nutrition will be maintained Outcome: Progressing   Problem: Elimination: Goal: Will not experience complications related to urinary retention Outcome: Progressing   Problem: Pain Managment: Goal: General experience of comfort will improve and/or be controlled Outcome: Progressing

## 2024-09-27 NOTE — Progress Notes (Signed)
 [facility], [place] 09/27/2024  Subjective:   Hospital day # 5  Patient laying in bed, eyes closed Unresponsive to questioning No family present  Remains on tube feeds, 55 mL/h Adequate urine output in PureWick  Creatinine 4.16 Urine output  Objective:  Vital signs in last 24 hours:  Temp:  [97.8 F (36.6 C)-99.5 F (37.5 C)] 97.8 F (36.6 C) (01/03 1139) Pulse Rate:  [63-70] 63 (01/03 1139) Resp:  [16-19] 16 (01/03 1139) BP: (105-135)/(62-90) 127/62 (01/03 1139) SpO2:  [95 %-98 %] 96 % (01/03 1139) Weight:  [81.8 kg] 81.8 kg (01/03 0500)  Weight change: 1.2 kg Filed Weights   09/22/24 2134 09/26/24 0458 09/27/24 0500  Weight: 77 kg 80.6 kg 81.8 kg    Intake/Output:    Intake/Output Summary (Last 24 hours) at 09/27/2024 1143 Last data filed at 09/27/2024 0800 Gross per 24 hour  Intake 2937.63 ml  Output 1800 ml  Net 1137.63 ml     Physical Exam: General: Chronically ill-appearing  HEENT [place] O2  Pulm/lungs Clear at bases  CVS/Heart Regular rate and rhythm  Abdomen:  Soft, nontender, nondistended  Extremities: Trace dependent edema  Neurologic: Somnolent  Skin: Warm, dry   External urinary catheter in place       Basic Metabolic Panel:  Recent Labs  Lab 09/24/24 0445 09/24/24 0955 09/25/24 0310 09/25/24 1411 09/26/24 0320 09/27/24 0423  NA 140  --  142 143 145 146*  K 5.1  --  4.2 4.5 3.9 3.1*  CL 101  --  100 101 102 105  CO2 13*  --  17* 16* 20* 27  GLUCOSE 126*  --  62* 72 108* 188*  BUN 115*  --  111* 105* 102* 84*  CREATININE 6.31*  --  5.70* 5.47* 5.04* 4.16*  CALCIUM  8.4*  --  7.8* 8.3* 8.0* 8.1*  MG  --  2.6* 2.3  --  2.3 2.2  PHOS  --  8.3* 5.9*  --  4.6 3.5     CBC: Recent Labs  Lab 09/22/24 1758 09/23/24 0628 09/24/24 0445 09/25/24 0310 09/26/24 0320 09/27/24 0423  WBC 11.4* 10.0 18.4* 8.6 5.9 5.6  NEUTROABS 9.1*  --   --   --   --   --   HGB 11.1* 11.2* 9.9* 9.9* 9.8* 9.7*  HCT 34.9*  36.1* 31.7* 31.0* 30.0* 29.7*  MCV 94.8 95.8 95.5 93.4 92.6 91.4  PLT 194 169 163 129* 122* 120*     No results found for: HEPBSAG, HEPBSAB, HEPBIGM    Microbiology:  Recent Results (from the past 240 hours)  Culture, blood (routine x 2)     Status: Abnormal   Collection Time: 09/22/24  5:37 PM   Specimen: BLOOD LEFT HAND  Result Value Ref Range Status   Specimen Description   Final    BLOOD LEFT HAND Performed at [facility], [address] [place], [place] [address]    Special Requests   Final    BOTTLES DRAWN AEROBIC AND ANAEROBIC Blood Culture results may not be optimal due to an inadequate volume of blood received in culture bottles Performed at [facility], [address] [place], [place] [address]    Culture  Setup Time   Final    GRAM POSITIVE COCCI AEROBIC BOTTLE ONLY CRITICAL VALUE NOTED.  VALUE IS CONSISTENT WITH PREVIOUSLY REPORTED AND CALLED VALUE. Performed at [facility], [address], [place], [place] [address]    Culture STAPHYLOCOCCUS  EPIDERMIDIS (A)  Final   Report Status 09/25/2024 FINAL  Final   Organism ID, Bacteria STAPHYLOCOCCUS EPIDERMIDIS  Final      Susceptibility   Staphylococcus epidermidis - MIC*    CIPROFLOXACIN 4 RESISTANT Resistant     ERYTHROMYCIN >=8 RESISTANT Resistant     GENTAMICIN  >=16 RESISTANT Resistant     OXACILLIN >=4 RESISTANT Resistant     TETRACYCLINE <=1 SENSITIVE Sensitive     VANCOMYCIN  2 SENSITIVE Sensitive     TRIMETH/SULFA 160 RESISTANT Resistant     CLINDAMYCIN >=8 RESISTANT Resistant     RIFAMPIN <=0.5 SENSITIVE Sensitive     Inducible Clindamycin NEGATIVE Sensitive     * STAPHYLOCOCCUS EPIDERMIDIS  Culture, blood (routine x 2)     Status: Abnormal   Collection Time: 09/22/24  5:57 PM   Specimen: Right Antecubital; Blood  Result Value Ref Range Status   Specimen Description   Final    RIGHT ANTECUBITAL Performed at [facility], [address]  [place], [place] [address]    Special Requests   Final    BOTTLES DRAWN AEROBIC AND ANAEROBIC Blood Culture results may not be optimal due to an inadequate volume of blood received in culture bottles Performed at [facility], [address] [place], [place] [address]    Culture  Setup Time   Final    GRAM POSITIVE COCCI ANAEROBIC BOTTLE ONLY CRITICAL RESULT CALLED TO, READ BACK BY AND VERIFIED WITH: EMILY STEINBOCK 09/23/24 1424 KLW    Culture STAPHYLOCOCCUS EPIDERMIDIS (A)  Final   Report Status 09/25/2024 FINAL  Final   Organism ID, Bacteria STAPHYLOCOCCUS EPIDERMIDIS  Final      Susceptibility   Staphylococcus epidermidis - MIC*    CIPROFLOXACIN <=0.5 SENSITIVE Sensitive     ERYTHROMYCIN >=8 RESISTANT Resistant     GENTAMICIN  >=16 RESISTANT Resistant     OXACILLIN >=4 RESISTANT Resistant     TETRACYCLINE <=1 SENSITIVE Sensitive     VANCOMYCIN  1 SENSITIVE Sensitive     TRIMETH/SULFA 160 RESISTANT Resistant     CLINDAMYCIN <=0.25 SENSITIVE Sensitive     RIFAMPIN <=0.5 SENSITIVE Sensitive     Inducible Clindamycin NEGATIVE Sensitive     * STAPHYLOCOCCUS EPIDERMIDIS  Blood Culture ID Panel (Reflexed)     Status: Abnormal   Collection Time: 09/22/24  5:57 PM  Result Value Ref Range Status   Enterococcus faecalis NOT DETECTED NOT DETECTED Final   Enterococcus Faecium NOT DETECTED NOT DETECTED Final   Listeria monocytogenes NOT DETECTED NOT DETECTED Final   Staphylococcus species DETECTED (A) NOT DETECTED Final    Comment: CRITICAL RESULT CALLED TO, READ BACK BY AND VERIFIED WITH: EMILY STEINBOCK 09/23/24 1424 KLW    Staphylococcus aureus (BCID) NOT DETECTED NOT DETECTED Final   Staphylococcus epidermidis DETECTED (A) NOT DETECTED Final    Comment: Methicillin (oxacillin) resistant coagulase negative staphylococcus. Possible blood culture contaminant (unless isolated from more than one blood culture draw or clinical case suggests pathogenicity). No antibiotic treatment is  indicated for blood  culture contaminants. CRITICAL RESULT CALLED TO, READ BACK BY AND VERIFIED WITH: EMILY STEINBOCK 09/23/24 1424 KLW    Staphylococcus lugdunensis NOT DETECTED NOT DETECTED Final   Streptococcus species NOT DETECTED NOT DETECTED Final   Streptococcus agalactiae NOT DETECTED NOT DETECTED Final   Streptococcus pneumoniae NOT DETECTED NOT DETECTED Final   Streptococcus pyogenes NOT DETECTED NOT DETECTED Final   A.calcoaceticus-baumannii NOT DETECTED NOT DETECTED Final   Bacteroides fragilis NOT DETECTED NOT DETECTED Final  Enterobacterales NOT DETECTED NOT DETECTED Final   Enterobacter cloacae complex NOT DETECTED NOT DETECTED Final   Escherichia coli NOT DETECTED NOT DETECTED Final   Klebsiella aerogenes NOT DETECTED NOT DETECTED Final   Klebsiella oxytoca NOT DETECTED NOT DETECTED Final   Klebsiella pneumoniae NOT DETECTED NOT DETECTED Final   Proteus species NOT DETECTED NOT DETECTED Final   Salmonella species NOT DETECTED NOT DETECTED Final   Serratia marcescens NOT DETECTED NOT DETECTED Final   Haemophilus influenzae NOT DETECTED NOT DETECTED Final   Neisseria meningitidis NOT DETECTED NOT DETECTED Final   Pseudomonas aeruginosa NOT DETECTED NOT DETECTED Final   Stenotrophomonas maltophilia NOT DETECTED NOT DETECTED Final   Candida albicans NOT DETECTED NOT DETECTED Final   Candida auris NOT DETECTED NOT DETECTED Final   Candida glabrata NOT DETECTED NOT DETECTED Final   Candida krusei NOT DETECTED NOT DETECTED Final   Candida parapsilosis NOT DETECTED NOT DETECTED Final   Candida tropicalis NOT DETECTED NOT DETECTED Final   Cryptococcus neoformans/gattii NOT DETECTED NOT DETECTED Final   Methicillin resistance mecA/C DETECTED (A) NOT DETECTED Final    Comment: CRITICAL RESULT CALLED TO, READ BACK BY AND VERIFIED WITH: EMILY STEINBOCK 09/23/24 1424 KLW Performed at [facility], [address] [place], [place] [address]   Resp panel by RT-PCR  (RSV, Flu A&B, Covid) Anterior Nasal Swab     Status: None   Collection Time: 09/22/24  5:58 PM   Specimen: Anterior Nasal Swab  Result Value Ref Range Status   SARS Coronavirus 2 by RT PCR NEGATIVE NEGATIVE Final    Comment: (NOTE) SARS-CoV-2 target nucleic acids are NOT DETECTED.  The SARS-CoV-2 RNA is generally detectable in upper respiratory specimens during the acute phase of infection. The lowest concentration of SARS-CoV-2 viral copies this assay can detect is 138 copies/mL. A negative result does not preclude SARS-Cov-2 infection and should not be used as the sole basis for treatment or other patient management decisions. A negative result may occur with  improper specimen collection/handling, submission of specimen other than nasopharyngeal swab, presence of viral mutation(s) within the areas targeted by this assay, and inadequate number of viral copies(<138 copies/mL). A negative result must be combined with clinical observations, patient history, and epidemiological information. The expected result is Negative.  Fact Sheet for Patients:  bloggercourse.com  Fact Sheet for Healthcare Providers:  seriousbroker.it  This test is no t yet approved or cleared by the [place]  FDA and  has been authorized for detection and/or diagnosis of SARS-CoV-2 by FDA under an Emergency Use Authorization (EUA). This EUA will remain  in effect (meaning this test can be used) for the duration of the COVID-19 declaration under Section 564(b)(1) of the Act, 21 U.S.C.section 360bbb-3(b)(1), unless the authorization is terminated  or revoked sooner.       Influenza A by PCR NEGATIVE NEGATIVE Final   Influenza B by PCR NEGATIVE NEGATIVE Final    Comment: (NOTE) The Xpert Xpress SARS-CoV-2/FLU/RSV plus assay is intended as an aid in the diagnosis of influenza from Nasopharyngeal swab specimens and should not be used as a sole basis for  treatment. Nasal washings and aspirates are unacceptable for Xpert Xpress SARS-CoV-2/FLU/RSV testing.  Fact Sheet for Patients: bloggercourse.com  Fact Sheet for Healthcare Providers: seriousbroker.it  This test is not yet approved or cleared by the [place]  FDA and has been authorized for detection and/or diagnosis of SARS-CoV-2 by FDA under an Emergency Use Authorization (EUA). This EUA will remain in effect (meaning  this test can be used) for the duration of the COVID-19 declaration under Section 564(b)(1) of the Act, 21 U.S.C. section 360bbb-3(b)(1), unless the authorization is terminated or revoked.     Resp Syncytial Virus by PCR NEGATIVE NEGATIVE Final    Comment: (NOTE) Fact Sheet for Patients: bloggercourse.com  Fact Sheet for Healthcare Providers: seriousbroker.it  This test is not yet approved or cleared by the [place]  FDA and has been authorized for detection and/or diagnosis of SARS-CoV-2 by FDA under an Emergency Use Authorization (EUA). This EUA will remain in effect (meaning this test can be used) for the duration of the COVID-19 declaration under Section 564(b)(1) of the Act, 21 U.S.C. section 360bbb-3(b)(1), unless the authorization is terminated or revoked.  Performed at [facility], [address] [place], [place] [address]   Culture, blood (Routine X 2) w Reflex to ID Panel     Status: None (Preliminary result)   Collection Time: 09/24/24  6:18 PM   Specimen: BLOOD  Result Value Ref Range Status   Specimen Description BLOOD BLOOD RIGHT HAND  Final   Special Requests   Final    BOTTLES DRAWN AEROBIC AND ANAEROBIC Blood Culture adequate volume   Culture   Final    NO GROWTH 3 DAYS Performed at [facility], [address] [place], [place] [address]    Report Status PENDING  Incomplete  MRSA Next Gen by PCR, Nasal      Status: Abnormal   Collection Time: 09/24/24  6:18 PM   Specimen: Nasal Mucosa; Nasal Swab  Result Value Ref Range Status   MRSA by PCR Next Gen DETECTED (A) NOT DETECTED Final    Comment: RESULT CALLED TO, READ BACK BY AND VERIFIED WITH:  GEORGINA OROZCO AT 2345 09/24/24 JG (NOTE) The GeneXpert MRSA Assay (FDA approved for NASAL specimens only), is one component of a comprehensive MRSA colonization surveillance program. It is not intended to diagnose MRSA infection nor to guide or monitor treatment for MRSA infections. Test performance is not FDA approved in patients less than 24 years old. Performed at [facility], [address] [place], [place] [address]   Culture, blood (Routine X 2) w Reflex to ID Panel     Status: None (Preliminary result)   Collection Time: 09/24/24  9:16 PM   Specimen: BLOOD  Result Value Ref Range Status   Specimen Description BLOOD BLOOD RIGHT HAND  Final   Special Requests   Final    BOTTLES DRAWN AEROBIC AND ANAEROBIC Blood Culture results may not be optimal due to an inadequate volume of blood received in culture bottles   Culture   Final    NO GROWTH 3 DAYS Performed at [facility], [address], [place], [place] [address]    Report Status PENDING  Incomplete  Urine Culture (for pregnant, neutropenic or urologic patients or patients with an indwelling urinary catheter)     Status: None   Collection Time: 09/25/24 10:03 AM   Specimen: Urine, Clean Catch  Result Value Ref Range Status   Specimen Description   Final    URINE, CLEAN CATCH Performed at [facility], [address] [place], [place] [address]    Special Requests   Final    NONE Performed at [facility], [address], [place], [place] [address]    Culture   Final    NO GROWTH Performed at [facility], [address] [place], [place] [address]  Report Status 09/26/2024 FINAL  Final    Coagulation Studies: No results for  input(s): LABPROT, INR in the last 72 hours.  Urinalysis: Recent Labs    09/25/24 1003  COLORURINE YELLOW*  LABSPEC 1.012  PHURINE 5.0  GLUCOSEU NEGATIVE  HGBUR LARGE*  BILIRUBINUR NEGATIVE  KETONESUR 5*  PROTEINUR 30*  NITRITE NEGATIVE  LEUKOCYTESUR LARGE*      Imaging: DG Abd 1 View Result Date: 09/26/2024 CLINICAL DATA:  Nasogastric tube. EXAM: ABDOMEN - 1 VIEW COMPARISON:  Radiograph yesterday FINDINGS: Tip and side port of the enteric tube below the diaphragm in the stomach. Slight decreased gaseous bowel distention in the upper abdomen. No evidence of free air. IMPRESSION: Tip and side port of the enteric tube below the diaphragm in the stomach. Electronically Signed   By: Andrea Gasman M.D.   On: 09/26/2024 16:20   DG Abd Portable 1V Result Date: 09/25/2024 CLINICAL DATA:  Nasogastric tube present. EXAM: PORTABLE ABDOMEN - 1 VIEW COMPARISON:  09/23/2024 FINDINGS: Tip of the enteric tube below the diaphragm in the stomach. The side port is just beyond the gastroesophageal junction. Prominent air-filled loops of bowel in the left abdomen. No evidence of free air. IMPRESSION: Tip of the enteric tube below the diaphragm in the stomach. The side port is just beyond the gastroesophageal junction. Electronically Signed   By: Andrea Gasman M.D.   On: 09/25/2024 17:34     Medications:    [START ON 09/28/2024] DAPTOmycin      dextrose  5 % and 0.45 % NaCl with KCl 40 mEq/L     ertapenem  500 mg (09/26/24 1422)   feeding supplement (OSMOLITE 1.5 CAL) 1,000 mL (09/27/24 0359)    ARIPiprazole   2 mg Per Tube QHS   artificial tears  1 drop Both Eyes QHS   vitamin C   500 mg Per Tube BID   aspirin   81 mg Per Tube Daily   clopidogrel   75 mg Per Tube Daily   cyanocobalamin   1,000 mcg Intramuscular Daily   Followed by   NOREEN ON 10/02/2024] vitamin B-12  1,000 mcg Per Tube Daily   feeding supplement (PROSource TF20)  60 mL Per Tube Daily   folic acid   1 mg Per Tube Daily   free  water   30 mL Per Tube Q4H   guaiFENesin   10 mL Per Tube Q4H   heparin  injection (subcutaneous)  5,000 Units Subcutaneous Q8H   insulin  aspart  0-9 Units Subcutaneous Q4H   levETIRAcetam   500 mg Per Tube Q24H   Or   levETIRAcetam   500 mg Intravenous Q24H   multivitamin with minerals  1 tablet Per Tube Daily   mupirocin  ointment  1 Application Nasal BID   omeprazole   20 mg Per Tube BID   mouth rinse  15 mL Mouth Rinse 4 times per day   senna-docusate  2 tablet Per Tube QHS   sertraline   175 mg Per Tube Daily   thiamine   100 mg Per Tube Daily   [START ON 09/29/2024] Vitamin D  (Ergocalciferol )  50,000 Units Oral Q7 days   zinc  sulfate (50mg  elemental zinc )  220 mg Per Tube Daily   acetaminophen  **OR** acetaminophen , camphor-menthol , ipratropium-albuterol , lactulose , ondansetron  **OR** ondansetron  (ZOFRAN ) IV, mouth rinse  Assessment/ Plan:  74 y.o. male with with multiple medical problems including history of stroke, peripheral vascular disease, hypertension, type 2 diabetes admitted on 09/22/2024 for Acute metabolic encephalopathy [G93.41]   Acute kidney injury Chronic kidney disease, stage IIIa.  Creatinine 1.54/GFR 49 from 08/04/2022  Diabetes type 2 with chronic kidney disease History of chronic left hydronephrosis  history of kidney stones and left ureteral stent placement and removal in February 2024 Acute metabolic encephalopathy Acute metabolic acidosis   Patient has severe acute kidney injury.  He has history of kidney stones, left hydronephrosis and left ureteral stent placement.  Renal ultrasound from 09/24/2024 is negative for hydronephrosis.  Nonobstructing calculi in both kidneys Renal function continues to improve with IV fluids.  Adequate urine output noted in PureWick.  BUN also improving.  No immediate indication for dialysis.  Will continue to monitor. Sodium bicarb has corrected with IV infusion.  Hemoglobin A1c 5.6% from 09/22/2024.  Management as as per primary  team.  Urinary tract infection-remains on daptomycin  and ertapenem     LOS: 5 Faith Harris 1/3/202611:43 AM  [facility] [place], [place] [phone]

## 2024-09-28 DIAGNOSIS — R931 Abnormal findings on diagnostic imaging of heart and coronary circulation: Secondary | ICD-10-CM

## 2024-09-28 DIAGNOSIS — N390 Urinary tract infection, site not specified: Secondary | ICD-10-CM | POA: Diagnosis not present

## 2024-09-28 DIAGNOSIS — J9601 Acute respiratory failure with hypoxia: Secondary | ICD-10-CM | POA: Diagnosis not present

## 2024-09-28 DIAGNOSIS — Z515 Encounter for palliative care: Secondary | ICD-10-CM | POA: Diagnosis not present

## 2024-09-28 DIAGNOSIS — G9341 Metabolic encephalopathy: Secondary | ICD-10-CM | POA: Diagnosis not present

## 2024-09-28 DIAGNOSIS — R079 Chest pain, unspecified: Secondary | ICD-10-CM

## 2024-09-28 LAB — BASIC METABOLIC PANEL WITH GFR
Anion gap: 14 (ref 5–15)
Anion gap: 13 (ref 5–15)
BUN: 74 mg/dL — ABNORMAL HIGH (ref 8–23)
BUN: 70 mg/dL — ABNORMAL HIGH (ref 8–23)
CO2: 28 mmol/L (ref 22–32)
CO2: 27 mmol/L (ref 22–32)
Calcium: 7.9 mg/dL — ABNORMAL LOW (ref 8.9–10.3)
Calcium: 7.8 mg/dL — ABNORMAL LOW (ref 8.9–10.3)
Chloride: 109 mmol/L (ref 98–111)
Chloride: 110 mmol/L (ref 98–111)
Creatinine, Ser: 3.5 mg/dL — ABNORMAL HIGH (ref 0.61–1.24)
Creatinine, Ser: 3.22 mg/dL — ABNORMAL HIGH (ref 0.61–1.24)
GFR, Estimated: 18 mL/min — ABNORMAL LOW
GFR, Estimated: 20 mL/min — ABNORMAL LOW
Glucose, Bld: 173 mg/dL — ABNORMAL HIGH (ref 70–99)
Glucose, Bld: 160 mg/dL — ABNORMAL HIGH (ref 70–99)
Potassium: 3.2 mmol/L — ABNORMAL LOW (ref 3.5–5.1)
Potassium: 3.9 mmol/L (ref 3.5–5.1)
Sodium: 152 mmol/L — ABNORMAL HIGH (ref 135–145)
Sodium: 150 mmol/L — ABNORMAL HIGH (ref 135–145)

## 2024-09-28 LAB — GASTROINTESTINAL PANEL BY PCR, STOOL (REPLACES STOOL CULTURE)

## 2024-09-28 LAB — CBC
HCT: 30 % — ABNORMAL LOW (ref 39.0–52.0)
Hemoglobin: 9.7 g/dL — ABNORMAL LOW (ref 13.0–17.0)
MCH: 29.7 pg (ref 26.0–34.0)
MCHC: 32.3 g/dL (ref 30.0–36.0)
MCV: 91.7 fL (ref 80.0–100.0)
Platelets: 125 K/uL — ABNORMAL LOW (ref 150–400)
RBC: 3.27 MIL/uL — ABNORMAL LOW (ref 4.22–5.81)
RDW: 13.1 % (ref 11.5–15.5)
WBC: 6.5 K/uL (ref 4.0–10.5)
nRBC: 0 % (ref 0.0–0.2)

## 2024-09-28 LAB — CK: Total CK: 100 U/L (ref 49–397)

## 2024-09-28 LAB — MAGNESIUM: Magnesium: 2 mg/dL (ref 1.7–2.4)

## 2024-09-28 LAB — GLUCOSE, CAPILLARY
Glucose-Capillary: 121 mg/dL — ABNORMAL HIGH (ref 70–99)
Glucose-Capillary: 157 mg/dL — ABNORMAL HIGH (ref 70–99)
Glucose-Capillary: 159 mg/dL — ABNORMAL HIGH (ref 70–99)
Glucose-Capillary: 168 mg/dL — ABNORMAL HIGH (ref 70–99)
Glucose-Capillary: 170 mg/dL — ABNORMAL HIGH (ref 70–99)
Glucose-Capillary: 175 mg/dL — ABNORMAL HIGH (ref 70–99)
Glucose-Capillary: 177 mg/dL — ABNORMAL HIGH (ref 70–99)

## 2024-09-28 LAB — PHOSPHORUS: Phosphorus: 2.7 mg/dL (ref 2.5–4.6)

## 2024-09-28 LAB — VITAMIN A: Vitamin A (Retinoic Acid): 39.9 ug/dL (ref 22.0–69.5)

## 2024-09-28 MED ORDER — FREE WATER
300.0000 mL | Status: DC
  Administered 2024-09-28 – 2024-10-02 (×23): 300 mL

## 2024-09-28 MED ORDER — KCL IN DEXTROSE-NACL 40-5-0.45 MEQ/L-%-% IV SOLN
INTRAVENOUS | Status: DC
  Filled 2024-09-28 (×2): qty 1000

## 2024-09-28 MED ORDER — POTASSIUM CHLORIDE 20 MEQ PO PACK
40.0000 meq | PACK | Freq: Once | ORAL | Status: AC
  Administered 2024-09-28: 40 meq
  Filled 2024-09-28: qty 2

## 2024-09-28 NOTE — Plan of Care (Signed)

## 2024-09-28 NOTE — Progress Notes (Signed)
 [facility] [place], [place] 09/28/2024  Subjective:   Hospital day # 6  Patient seen laying in bed Able to say good morning No verbal response to other questions No peripheral edema  Remains on tube feeds, 55 mL/h Adequate urine output in PureWick NGT  Creatinine 3.5 Urine output  Objective:  Vital signs in last 24 hours:  Temp:  [97.5 F (36.4 C)-98.6 F (37 C)] 98.6 F (37 C) (01/04 0839) Pulse Rate:  [57-67] 58 (01/04 0839) Resp:  [18-20] 18 (01/04 0839) BP: (129-159)/(64-70) 136/70 (01/04 0839) SpO2:  [95 %-100 %] 97 % (01/04 0839) Weight:  [82.7 kg] 82.7 kg (01/04 0500)  Weight change: 0.9 kg Filed Weights   09/26/24 0458 09/27/24 0500 09/28/24 0500  Weight: 80.6 kg 81.8 kg 82.7 kg    Intake/Output:    Intake/Output Summary (Last 24 hours) at 09/28/2024 1206 Last data filed at 09/28/2024 0800 Gross per 24 hour  Intake 4101.31 ml  Output 1550 ml  Net 2551.31 ml     Physical Exam: General: Chronically ill-appearing  HEENT [place] O2, NGT  Pulm/lungs Clear at bases  CVS/Heart Regular rate and rhythm  Abdomen:  Soft, nontender, nondistended  Extremities: Trace dependent edema  Neurologic: Somnolent  Skin: Warm, dry   External urinary catheter in place       Basic Metabolic Panel:  Recent Labs  Lab 09/24/24 0955 09/25/24 0310 09/25/24 1411 09/26/24 0320 09/27/24 0423 09/27/24 1954 09/28/24 0421  NA  --  142 143 145 146* 149* 152*  K  --  4.2 4.5 3.9 3.1* 3.1* 3.2*  CL  --  100 101 102 105 107 109  CO2  --  17* 16* 20* 27 27 28   GLUCOSE  --  62* 72 108* 188* 217* 173*  BUN  --  111* 105* 102* 84* 79* 74*  CREATININE  --  5.70* 5.47* 5.04* 4.16* 3.63* 3.50*  CALCIUM   --  7.8* 8.3* 8.0* 8.1* 7.7* 7.9*  MG 2.6* 2.3  --  2.3 2.2  --  2.0  PHOS 8.3* 5.9*  --  4.6 3.5  --  2.7     CBC: Recent Labs  Lab 09/22/24 1758 09/23/24 0628 09/24/24 0445 09/25/24 0310 09/26/24 0320 09/27/24 0423 09/28/24 0421  WBC 11.4*    < > 18.4* 8.6 5.9 5.6 6.5  NEUTROABS 9.1*  --   --   --   --   --   --   HGB 11.1*   < > 9.9* 9.9* 9.8* 9.7* 9.7*  HCT 34.9*   < > 31.7* 31.0* 30.0* 29.7* 30.0*  MCV 94.8   < > 95.5 93.4 92.6 91.4 91.7  PLT 194   < > 163 129* 122* 120* 125*   < > = values in this interval not displayed.     No results found for: HEPBSAG, HEPBSAB, HEPBIGM    Microbiology:  Recent Results (from the past 240 hours)  Culture, blood (routine x 2)     Status: Abnormal   Collection Time: 09/22/24  5:37 PM   Specimen: BLOOD LEFT HAND  Result Value Ref Range Status   Specimen Description   Final    BLOOD LEFT HAND Performed at [facility], [address] [place], [place] [address]    Special Requests   Final    BOTTLES DRAWN AEROBIC AND ANAEROBIC Blood Culture results may not be optimal due to an inadequate volume of blood received in culture bottles Performed at [facility], [address], [place], [place] [address]    Culture  Setup Time   Final    GRAM POSITIVE COCCI AEROBIC BOTTLE ONLY CRITICAL VALUE NOTED.  VALUE IS CONSISTENT WITH PREVIOUSLY REPORTED AND CALLED VALUE. Performed at [facility], [address] [place], [place] [address]    Culture STAPHYLOCOCCUS EPIDERMIDIS (A)  Final   Report Status 09/25/2024 FINAL  Final   Organism ID, Bacteria STAPHYLOCOCCUS EPIDERMIDIS  Final      Susceptibility   Staphylococcus epidermidis - MIC*    CIPROFLOXACIN 4 RESISTANT Resistant     ERYTHROMYCIN >=8 RESISTANT Resistant     GENTAMICIN  >=16 RESISTANT Resistant     OXACILLIN >=4 RESISTANT Resistant     TETRACYCLINE <=1 SENSITIVE Sensitive     VANCOMYCIN  2 SENSITIVE Sensitive     TRIMETH/SULFA 160 RESISTANT Resistant     CLINDAMYCIN >=8 RESISTANT Resistant     RIFAMPIN <=0.5 SENSITIVE Sensitive     Inducible Clindamycin NEGATIVE Sensitive     * STAPHYLOCOCCUS EPIDERMIDIS  Culture, blood (routine x 2)     Status: Abnormal   Collection Time: 09/22/24  5:57  PM   Specimen: Right Antecubital; Blood  Result Value Ref Range Status   Specimen Description   Final    RIGHT ANTECUBITAL Performed at [facility], [address] [place], [place] [address]    Special Requests   Final    BOTTLES DRAWN AEROBIC AND ANAEROBIC Blood Culture results may not be optimal due to an inadequate volume of blood received in culture bottles Performed at [facility], [address] [place], [place] [address]    Culture  Setup Time   Final    GRAM POSITIVE COCCI ANAEROBIC BOTTLE ONLY CRITICAL RESULT CALLED TO, READ BACK BY AND VERIFIED WITH: EMILY STEINBOCK 09/23/24 1424 KLW    Culture STAPHYLOCOCCUS EPIDERMIDIS (A)  Final   Report Status 09/25/2024 FINAL  Final   Organism ID, Bacteria STAPHYLOCOCCUS EPIDERMIDIS  Final      Susceptibility   Staphylococcus epidermidis - MIC*    CIPROFLOXACIN <=0.5 SENSITIVE Sensitive     ERYTHROMYCIN >=8 RESISTANT Resistant     GENTAMICIN  >=16 RESISTANT Resistant     OXACILLIN >=4 RESISTANT Resistant     TETRACYCLINE <=1 SENSITIVE Sensitive     VANCOMYCIN  1 SENSITIVE Sensitive     TRIMETH/SULFA 160 RESISTANT Resistant     CLINDAMYCIN <=0.25 SENSITIVE Sensitive     RIFAMPIN <=0.5 SENSITIVE Sensitive     Inducible Clindamycin NEGATIVE Sensitive     * STAPHYLOCOCCUS EPIDERMIDIS  Blood Culture ID Panel (Reflexed)     Status: Abnormal   Collection Time: 09/22/24  5:57 PM  Result Value Ref Range Status   Enterococcus faecalis NOT DETECTED NOT DETECTED Final   Enterococcus Faecium NOT DETECTED NOT DETECTED Final   Listeria monocytogenes NOT DETECTED NOT DETECTED Final   Staphylococcus species DETECTED (A) NOT DETECTED Final    Comment: CRITICAL RESULT CALLED TO, READ BACK BY AND VERIFIED WITH: EMILY STEINBOCK 09/23/24 1424 KLW    Staphylococcus aureus (BCID) NOT DETECTED NOT DETECTED Final   Staphylococcus epidermidis DETECTED (A) NOT DETECTED Final    Comment: Methicillin (oxacillin) resistant coagulase  negative staphylococcus. Possible blood culture contaminant (unless isolated from more than one blood culture draw or clinical case suggests pathogenicity). No antibiotic treatment is indicated for blood  culture contaminants. CRITICAL RESULT CALLED TO, READ BACK BY AND VERIFIED WITH: EMILY STEINBOCK 09/23/24 1424 KLW    Staphylococcus lugdunensis NOT DETECTED NOT  DETECTED Final   Streptococcus species NOT DETECTED NOT DETECTED Final   Streptococcus agalactiae NOT DETECTED NOT DETECTED Final   Streptococcus pneumoniae NOT DETECTED NOT DETECTED Final   Streptococcus pyogenes NOT DETECTED NOT DETECTED Final   A.calcoaceticus-baumannii NOT DETECTED NOT DETECTED Final   Bacteroides fragilis NOT DETECTED NOT DETECTED Final   Enterobacterales NOT DETECTED NOT DETECTED Final   Enterobacter cloacae complex NOT DETECTED NOT DETECTED Final   Escherichia coli NOT DETECTED NOT DETECTED Final   Klebsiella aerogenes NOT DETECTED NOT DETECTED Final   Klebsiella oxytoca NOT DETECTED NOT DETECTED Final   Klebsiella pneumoniae NOT DETECTED NOT DETECTED Final   Proteus species NOT DETECTED NOT DETECTED Final   Salmonella species NOT DETECTED NOT DETECTED Final   Serratia marcescens NOT DETECTED NOT DETECTED Final   Haemophilus influenzae NOT DETECTED NOT DETECTED Final   Neisseria meningitidis NOT DETECTED NOT DETECTED Final   Pseudomonas aeruginosa NOT DETECTED NOT DETECTED Final   Stenotrophomonas maltophilia NOT DETECTED NOT DETECTED Final   Candida albicans NOT DETECTED NOT DETECTED Final   Candida auris NOT DETECTED NOT DETECTED Final   Candida glabrata NOT DETECTED NOT DETECTED Final   Candida krusei NOT DETECTED NOT DETECTED Final   Candida parapsilosis NOT DETECTED NOT DETECTED Final   Candida tropicalis NOT DETECTED NOT DETECTED Final   Cryptococcus neoformans/gattii NOT DETECTED NOT DETECTED Final   Methicillin resistance mecA/C DETECTED (A) NOT DETECTED Final    Comment: CRITICAL RESULT  CALLED TO, READ BACK BY AND VERIFIED WITH: EMILY STEINBOCK 09/23/24 1424 KLW Performed at [facility], [address] [place], [place] [address]   Resp panel by RT-PCR (RSV, Flu A&B, Covid) Anterior Nasal Swab     Status: None   Collection Time: 09/22/24  5:58 PM   Specimen: Anterior Nasal Swab  Result Value Ref Range Status   SARS Coronavirus 2 by RT PCR NEGATIVE NEGATIVE Final    Comment: (NOTE) SARS-CoV-2 target nucleic acids are NOT DETECTED.  The SARS-CoV-2 RNA is generally detectable in upper respiratory specimens during the acute phase of infection. The lowest concentration of SARS-CoV-2 viral copies this assay can detect is 138 copies/mL. A negative result does not preclude SARS-Cov-2 infection and should not be used as the sole basis for treatment or other patient management decisions. A negative result may occur with  improper specimen collection/handling, submission of specimen other than nasopharyngeal swab, presence of viral mutation(s) within the areas targeted by this assay, and inadequate number of viral copies(<138 copies/mL). A negative result must be combined with clinical observations, patient history, and epidemiological information. The expected result is Negative.  Fact Sheet for Patients:  bloggercourse.com  Fact Sheet for Healthcare Providers:  seriousbroker.it  This test is no t yet approved or cleared by the [place]  FDA and  has been authorized for detection and/or diagnosis of SARS-CoV-2 by FDA under an Emergency Use Authorization (EUA). This EUA will remain  in effect (meaning this test can be used) for the duration of the COVID-19 declaration under Section 564(b)(1) of the Act, 21 U.S.C.section 360bbb-3(b)(1), unless the authorization is terminated  or revoked sooner.       Influenza A by PCR NEGATIVE NEGATIVE Final   Influenza B by PCR NEGATIVE NEGATIVE Final    Comment:  (NOTE) The Xpert Xpress SARS-CoV-2/FLU/RSV plus assay is intended as an aid in the diagnosis of influenza from Nasopharyngeal swab specimens and should not be used as a sole basis for treatment. Nasal washings and aspirates are unacceptable  for Xpert Xpress SARS-CoV-2/FLU/RSV testing.  Fact Sheet for Patients: bloggercourse.com  Fact Sheet for Healthcare Providers: seriousbroker.it  This test is not yet approved or cleared by the [place]  FDA and has been authorized for detection and/or diagnosis of SARS-CoV-2 by FDA under an Emergency Use Authorization (EUA). This EUA will remain in effect (meaning this test can be used) for the duration of the COVID-19 declaration under Section 564(b)(1) of the Act, 21 U.S.C. section 360bbb-3(b)(1), unless the authorization is terminated or revoked.     Resp Syncytial Virus by PCR NEGATIVE NEGATIVE Final    Comment: (NOTE) Fact Sheet for Patients: bloggercourse.com  Fact Sheet for Healthcare Providers: seriousbroker.it  This test is not yet approved or cleared by the [place]  FDA and has been authorized for detection and/or diagnosis of SARS-CoV-2 by FDA under an Emergency Use Authorization (EUA). This EUA will remain in effect (meaning this test can be used) for the duration of the COVID-19 declaration under Section 564(b)(1) of the Act, 21 U.S.C. section 360bbb-3(b)(1), unless the authorization is terminated or revoked.  Performed at [facility], [address] [place], [place] [address]   Culture, blood (Routine X 2) w Reflex to ID Panel     Status: None (Preliminary result)   Collection Time: 09/24/24  6:18 PM   Specimen: BLOOD  Result Value Ref Range Status   Specimen Description BLOOD BLOOD RIGHT HAND  Final   Special Requests   Final    BOTTLES DRAWN AEROBIC AND ANAEROBIC Blood Culture adequate volume    Culture   Final    NO GROWTH 4 DAYS Performed at [facility], [address] [place], [place] [address]    Report Status PENDING  Incomplete  MRSA Next Gen by PCR, Nasal     Status: Abnormal   Collection Time: 09/24/24  6:18 PM   Specimen: Nasal Mucosa; Nasal Swab  Result Value Ref Range Status   MRSA by PCR Next Gen DETECTED (A) NOT DETECTED Final    Comment: RESULT CALLED TO, READ BACK BY AND VERIFIED WITH:  GEORGINA OROZCO AT 2345 09/24/24 JG (NOTE) The GeneXpert MRSA Assay (FDA approved for NASAL specimens only), is one component of a comprehensive MRSA colonization surveillance program. It is not intended to diagnose MRSA infection nor to guide or monitor treatment for MRSA infections. Test performance is not FDA approved in patients less than 67 years old. Performed at [facility], [address] [place], [place] [address]   Culture, blood (Routine X 2) w Reflex to ID Panel     Status: None (Preliminary result)   Collection Time: 09/24/24  9:16 PM   Specimen: BLOOD  Result Value Ref Range Status   Specimen Description BLOOD BLOOD RIGHT HAND  Final   Special Requests   Final    BOTTLES DRAWN AEROBIC AND ANAEROBIC Blood Culture results may not be optimal due to an inadequate volume of blood received in culture bottles   Culture   Final    NO GROWTH 4 DAYS Performed at [facility], [address], [place], [place] [address]    Report Status PENDING  Incomplete  Urine Culture (for pregnant, neutropenic or urologic patients or patients with an indwelling urinary catheter)     Status: None   Collection Time: 09/25/24 10:03 AM   Specimen: Urine, Clean Catch  Result Value Ref Range Status   Specimen Description   Final    URINE, CLEAN CATCH Performed at [facility], [address], [place], [place] [address]    Special Requests   Final    NONE Performed at [facility], [address], [place], [place] [address]     Culture   Final    NO GROWTH Performed at [facility], [address] [place], [place] [address]    Report Status 09/26/2024 FINAL  Final    Coagulation Studies: No results for input(s): LABPROT, INR in the last 72 hours.  Urinalysis: No results for input(s): COLORURINE, LABSPEC, PHURINE, GLUCOSEU, HGBUR, BILIRUBINUR, KETONESUR, PROTEINUR, UROBILINOGEN, NITRITE, LEUKOCYTESUR in the last 72 hours.  Invalid input(s): APPERANCEUR     Imaging: DG Abd 1 View Result Date: 09/26/2024 CLINICAL DATA:  Nasogastric tube. EXAM: ABDOMEN - 1 VIEW COMPARISON:  Radiograph yesterday FINDINGS: Tip and side port of the enteric tube below the diaphragm in the stomach. Slight decreased gaseous bowel distention in the upper abdomen. No evidence of free air. IMPRESSION: Tip and side port of the enteric tube below the diaphragm in the stomach. Electronically Signed   By: Andrea Gasman M.D.   On: 09/26/2024 16:20     Medications:    DAPTOmycin      dextrose  5 % and 0.45 % NaCl with KCl 40 mEq/L 75 mL/hr at 09/28/24 1035   ertapenem  Stopped (09/27/24 1227)   feeding supplement (OSMOLITE 1.5 CAL) 55 mL/hr at 09/28/24 0400    ARIPiprazole   2 mg Per Tube QHS   artificial tears  1 drop Both Eyes QHS   vitamin C   500 mg Per Tube BID   aspirin   81 mg Per Tube Daily   clopidogrel   75 mg Per Tube Daily   cyanocobalamin   1,000 mcg Intramuscular Daily   Followed by   NOREEN ON 10/02/2024] vitamin B-12  1,000 mcg Per Tube Daily   feeding supplement (PROSource TF20)  60 mL Per Tube Daily   folic acid   1 mg Per Tube Daily   free water   150 mL Per Tube Q4H   guaiFENesin   10 mL Per Tube Q4H   heparin  injection (subcutaneous)  5,000 Units Subcutaneous Q8H   insulin  aspart  0-9 Units Subcutaneous Q4H   levETIRAcetam   500 mg Per Tube Q24H   Or   levETIRAcetam   500 mg Intravenous Q24H   multivitamin with minerals  1 tablet Per Tube Daily   mupirocin  ointment  1 Application Nasal  BID   omeprazole   20 mg Per Tube BID   mouth rinse  15 mL Mouth Rinse 4 times per day   senna-docusate  2 tablet Per Tube QHS   sertraline   175 mg Per Tube Daily   thiamine   100 mg Per Tube Daily   [START ON 09/29/2024] Vitamin D  (Ergocalciferol )  50,000 Units Oral Q7 days   zinc  sulfate (50mg  elemental zinc )  220 mg Per Tube Daily   acetaminophen  **OR** acetaminophen , camphor-menthol , ipratropium-albuterol , lactulose , ondansetron  **OR** ondansetron  (ZOFRAN ) IV, mouth rinse  Assessment/ Plan:  74 y.o. male with with multiple medical problems including history of stroke, peripheral vascular disease, hypertension, type 2 diabetes admitted on 09/22/2024 for Acute metabolic encephalopathy [G93.41]   Acute kidney injury Chronic kidney disease, stage IIIa.  Creatinine 1.54/GFR 49 from 08/04/2022 Diabetes type 2 with chronic kidney disease History of chronic left hydronephrosis  history of kidney stones and left ureteral stent placement and removal in February 2024 Acute metabolic encephalopathy Acute metabolic acidosis   Patient has severe acute kidney injury.  He has history of kidney stones, left hydronephrosis  and left ureteral stent placement.  Renal ultrasound from 09/24/2024 is negative for hydronephrosis.  Nonobstructing calculi in both kidneys Mentation improving with renal function. No immediate indication for dialysis.  Will continue to monitor. Sodium bicarb has corrected   Hemoglobin A1c 5.6% from 09/22/2024.  Management as as per primary team.  Urinary tract infection-remains on daptomycin  and ertapenem     LOS: 6 [facility] 1/4/202612:06 PM  [facility] [place], [place] [phone]

## 2024-09-28 NOTE — Plan of Care (Signed)
" °  Problem: Education: Goal: Knowledge of General Education information will improve Description: Including pain rating scale, medication(s)/side effects and non-pharmacologic comfort measures 09/28/2024 1901 by Donavan Seaman, RN Outcome: Progressing 09/28/2024 1901 by Donavan Seaman, RN Outcome: Progressing   Problem: Clinical Measurements: Goal: Diagnostic test results will improve 09/28/2024 1901 by Donavan Seaman, RN Outcome: Progressing 09/28/2024 1901 by Donavan Seaman, RN Outcome: Progressing Goal: Respiratory complications will improve 09/28/2024 1901 by Donavan Seaman, RN Outcome: Progressing 09/28/2024 1901 by Donavan Seaman, RN Outcome: Progressing Goal: Cardiovascular complication will be avoided 09/28/2024 1901 by Donavan Seaman, RN Outcome: Progressing 09/28/2024 1901 by Donavan Seaman, RN Outcome: Progressing   Problem: Nutrition: Goal: Adequate nutrition will be maintained Outcome: Progressing   "

## 2024-09-28 NOTE — Plan of Care (Signed)
  Problem: Education: Goal: Knowledge of General Education information will improve Description: Including pain rating scale, medication(s)/side effects and non-pharmacologic comfort measures Outcome: Progressing   Problem: Clinical Measurements: Goal: Diagnostic test results will improve Outcome: Progressing Goal: Respiratory complications will improve Outcome: Progressing Goal: Cardiovascular complication will be avoided Outcome: Progressing

## 2024-09-28 NOTE — Progress Notes (Signed)
 15:00 :- Patient had several liquid BM's since yesterday, MD was made aware of, order received for stool sample to rule out C. Difficle. Sample was sent to lab at 6pm after patient had another BM. Patient complained of chest pain, MD aware, EKG done, NSR.

## 2024-09-28 NOTE — Progress Notes (Signed)
 "                                                                                                                                                                                                          Daily Progress Note   Patient Name: Rodney Estrada       Date: 09/28/2024 DOB: September 26, 1950  Age: 74 y.o. MRN#: [id number] Attending Physician: Von Bellis, MD Primary Care Physician: Pcp, No Admit Date: 09/22/2024  Reason for Consultation/Follow-up: Establishing goals of care  HPI/Brief Hospital Review: 74 y.o. male  with past medical history of CAD, CHF, HTN, HLD, A. Fib, T2DM and CVA with left sided hemiparesis admitted from [facility] on 09/22/2024 with increased confusion and decreased responsiveness reported by facility staff, symptoms first noticed Friday prior to admission on Sunday.   Admitted and being treated for UTI, severe AKI, blood cultures positive for staph epidermis possible contamination, and elevated troponin levels--echo ordered   Palliative medicine was consulted for assisting with goals of care conversations.  Subjective: Extensive chart review has been completed prior to meeting patient including labs, vital signs, imaging, progress notes, orders, and available advanced directive documents from current and previous encounters.    Visited with Mr. Covello at his bedside. He is awake and alert in bed, able to state his name and DOB, aware he is in hospital, unaware of reasoning for hospitalization. Mr. Lindon shares he can recall his sister visiting earlier today--unfortunately she left bedside before this visit. Mr. Elgersma is able to follow commands. He denies acute pain or discomfort and overall feeling fine.  Renal function continues to improve, continued improvement in neurological status.  As per previous conversation with sister, goal was to continue with current plan of care allowing time for outcomes, Mr. Jellison is gradually showing signs of improvement.  PMT  will step away from daily visits but will remain available peripherally and will check in mid week. Please engage if needs or concerns arise.  Objective:  Physical Exam Constitutional:      General: He is not in acute distress.    Appearance: He is ill-appearing.  HENT:     Head: Normocephalic.     Mouth/Throat:     Mouth: Mucous membranes are dry.  Pulmonary:     Effort: Pulmonary effort is normal. No respiratory distress.  Abdominal:     General: There is no distension.     Palpations: Abdomen is soft.     Tenderness: There is no abdominal tenderness.  Skin:    General: Skin is warm and dry.  Findings: Bruising present.  Neurological:     Mental Status: He is alert.     Motor: Weakness present.  Psychiatric:        Mood and Affect: Mood normal.        Behavior: Behavior normal.             Vital Signs: BP (!) 154/68 (BP Location: Right Arm)   Pulse 68   Temp 98.2 F (36.8 C)   Resp 18   Ht 5' 9 (1.753 m)   Wt 82.7 kg   SpO2 100%   BMI 26.92 kg/m  SpO2: SpO2: 100 % O2 Device: O2 Device: Nasal Cannula O2 Flow Rate: O2 Flow Rate (L/min): 2 L/min   Palliative Care Assessment & Plan   Assessment/Recommendation/Plan  Continue with current plan of care  Thank you for allowing the Palliative Medicine Team to assist in the care of this patient.  Visit includes: Detailed review of medical records (labs, imaging, vital signs), medically appropriate exam (mental status, respiratory, cardiac, skin), discussed with treatment team, counseling and educating patient, family and staff, documenting clinical information, medication management and coordination of care.  Waddell Lesches, DNP, AGNP-C Palliative Medicine   Please contact Palliative Medicine Team phone at [phone] for questions and concerns.   "

## 2024-09-28 NOTE — Progress Notes (Signed)
 [facility] Progress Note  Patient: Rodney Estrada    FMW:[id number]  DOA: 09/22/2024     Date of Service: the patient was seen and examined on 09/28/2024  No chief complaint on file.  Brief hospital course:  from H&P - Rodney Estrada is a 74 y.o. male with medical history significant for coronary artery disease, chronic systolic CHF, gout, hypertension, dyslipidemia, atrial fibrillation and CVA as well as type 2 diabetes mellitus who presented to the emergency room 12/29 with acute onset of altered mental status with confusion and decreased responsiveness since Friday 12/26 at his SNF.  He has been nonverbal with increased tremors.  No reported fever or chills.  SNF staff noted congested cough and therefore the patient was given IM Rocephin  this afternoon.  The patient was nonverbal during admitting hospitalist's interview and would answer some questions nodding his head.  No chest pain or palpitations were reported.  No reported nausea or vomiting or diarrhea or abdominal pain.     12/29: to ED. (+)UTI, started on IV fluids, vanc, cefepime . Admitted to hospitalist for acute encephalopathy d/t UTI, AKI on CKD 3a assoc w/ hyperkalemia. Concern w/ elevation troponins, heparin  gtt started.  12/30: holding heparin  gtt given hematuria. Cardiology following, in agreement. Echo pending. Pt remains confused. Echo pending.          Consultants:  Cardiology signed off on 12/31 Nephrology   Procedures/Surgeries: None   Assessment and Plan:  # Severe sepsis secondary to UTI Leukocytosis, tachycardia, tachypneic and hypotensive on 12/31 Patient responded well to IV fluid, did not require Levophed  Transferred to ICU, lactic acid within normal range and vitals improved after IV fluid so transferred to progressive unit.  cortisol level wnl S/p vancomycin , trough level was high, DC'd S/p ceftriaxone  till 1/2 12/31 NS 500 mL bolus and LR 500 bolus, 1/1 LR 500 mL bolus PCCM consulted, LR 500 bolus  ordered, if no improvement then patient will need Levophed . 1/2 started Invanz  as per ID 1/3 started daptomycin  as per ID, pharmacy consulted for dose  # Acute metabolic encephalopathy secondary to uremia MRI brain pending radiology read - appears stable from previous on personal review  Continue neurochecks  keppra  levels elevated, pharmacy consulted for dose and monitor Hold gabapentin  (300 mg tid home dose) 1/4 awake and alert today, opened his eyes and able to tell me his name.    # AKI on CKD stage III # Metabolic encephalopathy secondary to uremia # Metabolic acidosis History of chronic left hydronephrosis  history of kidney stones and left ureteral stent placement and removal in February 2024 Bicarbonate 100 mEq IV one-time push given Started bicarb IV infusion Nephrology consulted for urgent hemodialysis Palliative care consulted, as per nephro patient may not be able to get hemodialysis done as an outpatient due to comorbidities. 1/2 discussed with nephrology again today to start hemodialysis as patient's mental status is not improving due to uremia Nephrology will revisit tomorrow and consider hemodialysis tomorrow a.m. if needed 1/3 metabolic acidosis resolved, bicarb infusion discontinued.  Renal functions are improving, nephrology recommended no need of urgent hemodialysis and continue to monitor. Started free water  due to elevated sodium 1/4 sCr 3.5, BUN 74, gradually improved   # Acute lower UTI S/p IV Rocephin   1/1 repeated urine cultures negative 1/2 started ertapenem  1/3 started daptomycin    # Staph epidermidis blood culture positive Possible contamination Urine culture negative S/p vancomycin , DC'd due to renal failure 1/2 started Invanz  1/3 started daptomycin  ID consult appreciated    #  Elevated troponin / Type II  likely demand ischemia  S/p IV heparin  dc TTE LVEF >55, Left ventricular endocardial border not optimally defined to evaluate regional  wall motion, moderate LVH. Cardiology consulted recommended no intervention.  Signed off on 12/31     # Gross hematuria: Resolved  Question due to UTI Monitor CBC   # Hyperkalemia: Resolved  D/t AKI Treat underlying cause(s) as noted  S/p lokelma . Follow BMP  # Hypokalemia 1/3 low potassium 3.1, repleted with IV fluid 1/4 K 3.2 low, potassium repleted Monitor electrolytes daily    # Essential hypertension but low BP due to sepsis Hold amlodipine , lisinopril     # Coronary artery disease ASA, Plavix , statin Hold antiypertensives    Gout Hold allopurinol  w/ AKI   Depression Continue Zoloft  home dose 175 mg daily --> 150  Abilify     GERD without esophagitis PPI   Seizure disorder continue Keppra  Check keppra  levels    Constipation Bowel regimen per orders   Type 2 diabetes mellitus with peripheral neuropathy  Not on home antihyperglycemics No glucosuria Holding SSI Monitor Glc achs for now and initiate insulin  if needed  A1C hold Neurontin  w/ AKI and encephalopathy   Prednisone rx outpatient started 12/26 20 mg daily NOT chronic medication Uncertain why but suspect as above he was treated for presumed respiratory illness at faciltiy - will dc this  cortisol level wnl  # Folic acid  deficiency: started folic acid  1 mg p.o. daily.  Follow-up with PCP to repeat folic acid  level in between 3 to 6 months.   Overall condition prognosis is very poor, patient may not survive. Critical care consulted, we will continue above management. Palliative care consulted for goals of care discussion.   Body mass index is 26.92 kg/m.  Interventions:  Diet: NPO/continue NG tube feeds DVT Prophylaxis: Subcutaneous Heparin     Advance goals of care discussion: DNR-limited  Family Communication: family was not present at bedside, at the time of interview.  The pt provided permission to discuss medical plan with the family. Opportunity was given to ask question and all  questions were answered satisfactorily.  12/31 called and left a VM for his sister [name] [phone]  09/25/24 I spoke to patient's sister today and updated the management plan and his current situation, poor prognosis. 1/3 I spoke to patient's [name], and updated her regarding his brother's condition.   Disposition:  Pt is from SNF, admitted with sepsis, AMS, AKI, uremia, still critically ill, which precludes a safe discharge. Discharge to SNF, when stable, may need few days to improve.  Subjective: No significant events overnight.  Patient is more awake and alert today, he opened his eyes on calling the name and was able to tell me his name, did not offer any other complaints    Physical Exam: General: NAD, resting comfortably Eyes: PERRLA ENT: Oral Mucosa Clear, and dry Neck: no JVD,  Cardiovascular: S1 and S2 Present, no Murmur,  Respiratory: good respiratory effort, Bilateral Air entry equal and Decreased, no Crackles, no wheezes Abdomen: Bowel Sound present, Soft and no tenderness,  Skin: no rashes Extremities: no Pedal edema, no calf tenderness Neurologic: Left-sided residual weakness due to prior CVA.  AO x 1, opened his eyes    Vitals:   09/28/24 0445 09/28/24 0500 09/28/24 0622 09/28/24 0839  BP: 130/64  129/64 136/70  Pulse: (!) 57  60 (!) 58  Resp: 18  20 18   Temp: (!) 97.5 F (36.4 C)  98.1 F (36.7  C) 98.6 F (37 C)  TempSrc:   Oral Oral  SpO2: 99%  100% 97%  Weight:  82.7 kg    Height:        Intake/Output Summary (Last 24 hours) at 09/28/2024 1347 Last data filed at 09/28/2024 1200 Gross per 24 hour  Intake 4251.31 ml  Output 1550 ml  Net 2701.31 ml   Filed Weights   09/26/24 0458 09/27/24 0500 09/28/24 0500  Weight: 80.6 kg 81.8 kg 82.7 kg    Data Reviewed: I have personally reviewed and interpreted daily labs, tele strips, imagings as discussed above. I reviewed all nursing notes, pharmacy notes, vitals, pertinent old  records I have discussed plan of care as described above with RN and patient/family.  CBC: Recent Labs  Lab 09/22/24 1758 09/23/24 0628 09/24/24 0445 09/25/24 0310 09/26/24 0320 09/27/24 0423 09/28/24 0421  WBC 11.4*   < > 18.4* 8.6 5.9 5.6 6.5  NEUTROABS 9.1*  --   --   --   --   --   --   HGB 11.1*   < > 9.9* 9.9* 9.8* 9.7* 9.7*  HCT 34.9*   < > 31.7* 31.0* 30.0* 29.7* 30.0*  MCV 94.8   < > 95.5 93.4 92.6 91.4 91.7  PLT 194   < > 163 129* 122* 120* 125*   < > = values in this interval not displayed.   Basic Metabolic Panel: Recent Labs  Lab 09/24/24 0955 09/25/24 0310 09/25/24 1411 09/26/24 0320 09/27/24 0423 09/27/24 1954 09/28/24 0421  NA  --  142 143 145 146* 149* 152*  K  --  4.2 4.5 3.9 3.1* 3.1* 3.2*  CL  --  100 101 102 105 107 109  CO2  --  17* 16* 20* 27 27 28   GLUCOSE  --  62* 72 108* 188* 217* 173*  BUN  --  111* 105* 102* 84* 79* 74*  CREATININE  --  5.70* 5.47* 5.04* 4.16* 3.63* 3.50*  CALCIUM   --  7.8* 8.3* 8.0* 8.1* 7.7* 7.9*  MG 2.6* 2.3  --  2.3 2.2  --  2.0  PHOS 8.3* 5.9*  --  4.6 3.5  --  2.7    Studies: No results found.   Scheduled Meds:  ARIPiprazole   2 mg Per Tube QHS   artificial tears  1 drop Both Eyes QHS   vitamin C   500 mg Per Tube BID   aspirin   81 mg Per Tube Daily   clopidogrel   75 mg Per Tube Daily   cyanocobalamin   1,000 mcg Intramuscular Daily   Followed by   NOREEN ON 10/02/2024] vitamin B-12  1,000 mcg Per Tube Daily   feeding supplement (PROSource TF20)  60 mL Per Tube Daily   folic acid   1 mg Per Tube Daily   free water   150 mL Per Tube Q4H   guaiFENesin   10 mL Per Tube Q4H   heparin  injection (subcutaneous)  5,000 Units Subcutaneous Q8H   insulin  aspart  0-9 Units Subcutaneous Q4H   levETIRAcetam   500 mg Per Tube Q24H   Or   levETIRAcetam   500 mg Intravenous Q24H   multivitamin with minerals  1 tablet Per Tube Daily   mupirocin  ointment  1 Application Nasal BID   omeprazole   20 mg Per Tube BID   mouth rinse  15  mL Mouth Rinse 4 times per day   senna-docusate  2 tablet Per Tube QHS   sertraline   175 mg Per Tube Daily  thiamine   100 mg Per Tube Daily   [START ON 09/29/2024] Vitamin D  (Ergocalciferol )  50,000 Units Oral Q7 days   zinc  sulfate (50mg  elemental zinc )  220 mg Per Tube Daily   Continuous Infusions:  DAPTOmycin      dextrose  5 % and 0.45 % NaCl with KCl 40 mEq/L 75 mL/hr at 09/28/24 1035   ertapenem  500 mg (09/28/24 1327)   feeding supplement (OSMOLITE 1.5 CAL) 55 mL/hr at 09/28/24 0400   PRN Meds: acetaminophen  **OR** acetaminophen , camphor-menthol , ipratropium-albuterol , lactulose , ondansetron  **OR** ondansetron  (ZOFRAN ) IV, mouth rinse  Time spent: 55 minutes  Author: ELVAN SOR. MD [facility] 09/28/2024 1:47 PM  To reach On-call, see care teams to locate the attending and reach out to them via [email or web address]. If 7PM-7AM, please contact night-coverage If you still have difficulty reaching the attending provider, please page the [facility] (Director on Call) for [facility] on amion for assistance.

## 2024-09-29 ENCOUNTER — Inpatient Hospital Stay

## 2024-09-29 DIAGNOSIS — B9620 Unspecified Escherichia coli [E. coli] as the cause of diseases classified elsewhere: Secondary | ICD-10-CM

## 2024-09-29 DIAGNOSIS — N179 Acute kidney failure, unspecified: Secondary | ICD-10-CM | POA: Diagnosis not present

## 2024-09-29 DIAGNOSIS — N390 Urinary tract infection, site not specified: Secondary | ICD-10-CM | POA: Diagnosis not present

## 2024-09-29 DIAGNOSIS — B957 Other staphylococcus as the cause of diseases classified elsewhere: Secondary | ICD-10-CM | POA: Diagnosis not present

## 2024-09-29 DIAGNOSIS — Z1612 Extended spectrum beta lactamase (ESBL) resistance: Secondary | ICD-10-CM

## 2024-09-29 DIAGNOSIS — Z66 Do not resuscitate: Secondary | ICD-10-CM

## 2024-09-29 DIAGNOSIS — N10 Acute tubulo-interstitial nephritis: Secondary | ICD-10-CM

## 2024-09-29 DIAGNOSIS — G9341 Metabolic encephalopathy: Secondary | ICD-10-CM | POA: Diagnosis not present

## 2024-09-29 DIAGNOSIS — N189 Chronic kidney disease, unspecified: Secondary | ICD-10-CM | POA: Diagnosis not present

## 2024-09-29 LAB — CULTURE, BLOOD (ROUTINE X 2)
Culture: NO GROWTH
Culture: NO GROWTH
Special Requests: ADEQUATE

## 2024-09-29 LAB — CBC
HCT: 30.5 % — ABNORMAL LOW (ref 39.0–52.0)
Hemoglobin: 9.8 g/dL — ABNORMAL LOW (ref 13.0–17.0)
MCH: 29.8 pg (ref 26.0–34.0)
MCHC: 32.1 g/dL (ref 30.0–36.0)
MCV: 92.7 fL (ref 80.0–100.0)
Platelets: 142 K/uL — ABNORMAL LOW (ref 150–400)
RBC: 3.29 MIL/uL — ABNORMAL LOW (ref 4.22–5.81)
RDW: 13.2 % (ref 11.5–15.5)
WBC: 8.1 K/uL (ref 4.0–10.5)
nRBC: 0 % (ref 0.0–0.2)

## 2024-09-29 LAB — BASIC METABOLIC PANEL WITH GFR
Anion gap: 11 (ref 5–15)
BUN: 63 mg/dL — ABNORMAL HIGH (ref 8–23)
CO2: 27 mmol/L (ref 22–32)
Calcium: 8.3 mg/dL — ABNORMAL LOW (ref 8.9–10.3)
Chloride: 110 mmol/L (ref 98–111)
Creatinine, Ser: 3.16 mg/dL — ABNORMAL HIGH (ref 0.61–1.24)
GFR, Estimated: 20 mL/min — ABNORMAL LOW
Glucose, Bld: 169 mg/dL — ABNORMAL HIGH (ref 70–99)
Potassium: 4 mmol/L (ref 3.5–5.1)
Sodium: 148 mmol/L — ABNORMAL HIGH (ref 135–145)

## 2024-09-29 LAB — GLUCOSE, CAPILLARY
Glucose-Capillary: 173 mg/dL — ABNORMAL HIGH (ref 70–99)
Glucose-Capillary: 161 mg/dL — ABNORMAL HIGH (ref 70–99)
Glucose-Capillary: 166 mg/dL — ABNORMAL HIGH (ref 70–99)
Glucose-Capillary: 112 mg/dL — ABNORMAL HIGH (ref 70–99)
Glucose-Capillary: 134 mg/dL — ABNORMAL HIGH (ref 70–99)
Glucose-Capillary: 127 mg/dL — ABNORMAL HIGH (ref 70–99)

## 2024-09-29 LAB — PHOSPHORUS: Phosphorus: 1.3 mg/dL — ABNORMAL LOW (ref 2.5–4.6)

## 2024-09-29 MED ORDER — ERGOCALCIFEROL 200 MCG/ML PO SOLN
50000.0000 [IU] | ORAL | Status: DC
  Administered 2024-09-29: 50000 [IU]
  Filled 2024-09-29: qty 6.25

## 2024-09-29 MED ORDER — KCL IN DEXTROSE-NACL 40-5-0.45 MEQ/L-%-% IV SOLN
INTRAVENOUS | Status: AC
  Filled 2024-09-29 (×2): qty 1000

## 2024-09-29 MED ORDER — LOPERAMIDE HCL 2 MG PO CAPS
2.0000 mg | ORAL_CAPSULE | Freq: Three times a day (TID) | ORAL | Status: DC | PRN
  Administered 2024-09-29: 2 mg via ORAL
  Filled 2024-09-29: qty 1

## 2024-09-29 MED ORDER — LOPERAMIDE HCL 1 MG/7.5ML PO SUSP: 2.0000 mg | Freq: Three times a day (TID) | ORAL | Status: DC | PRN

## 2024-09-29 MED ORDER — K PHOS MONO-SOD PHOS DI & MONO 155-852-130 MG PO TABS
500.0000 mg | ORAL_TABLET | Freq: Four times a day (QID) | ORAL | Status: AC
  Administered 2024-09-29 – 2024-09-30 (×8): 500 mg via NASOGASTRIC
  Filled 2024-09-29 (×8): qty 2

## 2024-09-29 MED ORDER — BANATROL TF EN LIQD
60.0000 mL | Freq: Two times a day (BID) | ENTERAL | Status: DC
  Administered 2024-09-29 – 2024-10-01 (×5): 60 mL

## 2024-09-29 NOTE — Plan of Care (Signed)

## 2024-09-29 NOTE — Progress Notes (Addendum)
 Nutrition Follow-up  DOCUMENTATION CODES:   Non-severe (moderate) malnutrition in context of chronic illness  INTERVENTION:   -TF via NGT:    Continue Osmolite 1.5 @ 55 ml/hr   60 ml Prosource TF20 daily   300 ml free water  flush every 4 hours   Tube feeding regimen provides 2060 kcal (100% of needs), 103 grams of protein, and 1006 ml of H2O.  Total free water : 2986 ml daily  -60 ml Banatrol BID  -Continue 1 mg folic acid  daily -Continue MVI with minerals daily -Continue 100 mg thiamine  daily x 7 days -Continue 500 mg vitamin C  BID -Continue 220 mg zinc  sulfate daily x 14 days  -Monitor Mg, K, and Phos and replete as  needed secondary to high refeeding risk   NUTRITION DIAGNOSIS:   Moderate Malnutrition related to chronic illness (CHF, CVA) as evidenced by mild fat depletion, moderate fat depletion, mild muscle depletion, moderate muscle depletion.  Ongoing  GOAL:   Patient will meet greater than or equal to 90% of their needs  Progressing   MONITOR:   TF tolerance, Labs, Weight trends, I & O's  REASON FOR ASSESSMENT:   Consult Enteral/tube feeding initiation and management  ASSESSMENT:   Pt with hx of PVD, CAD, CHF, gout, HLD, HTN, atrial fibrillation, prior CVA, hx ETOH abuse/tobacco abuse, CKD3a, and DM type 2 presented to ED from his facility with AMS and lethargy worsening over the last several days.Found to have a severe AKI and UTI.  1/1- NGT placed (KUB confirmed tip of tube and side port in stomach), TF initiated   Reviewed I/O's: +861 ml x 24 hours and +8.7 L since admission  UOP: 2.5 L x 24 hours   Case discussed with RN, who reports that patient was a little more interactive per report and was talking yesterday. Patient lying in bed at time of visit. Patient was able to open eyes and track RD, but did not interact or answer any questions.   Patient is NPO and receiving TF via NGT for sole source nutrition. Osmolite 1.5 infusing at 55 ml/hr. Per  RN, tolerating TF well.   RD will add banatrol secondary to loose stools.   Reviewed weights; weight has ranged from 77-82.7 kg this admission.   Nephrology following. No need for HD for now. Free water  increased yesterday due to hypernatremia.   Palliative care following for goals of care discussions. Patient has been a resident of [facility] for 4 years since his CVA. He is wheelchair bound and dependent on all ADL's at baseline. At this time, family desires full scope treatment and allow times for outcomes.   Medications reviewed and include vitamin C , vitamin B-12, keppra , MVI with minerals daily, phosphorus, senokot, thiamine , vitamin D , zinc  sulfate, and dextrose  5% and 0.45% NaCl with KCl 40 mEq/L infusion @ 50 ml/hr..   Labs reviewed: Na: 148, Phos: 1.3 (on supplementation), Mg and K WDL,  CBGS: 121-173 (inpatient orders for glycemic control are 0-9 units insulin  aspart every 4 hours). Vitamin A : 39.9, CRP: 8.6.  Diet Order:   Diet Order             Diet NPO time specified  Diet effective now                   EDUCATION NEEDS:   Not appropriate for education at this time  Skin:  Skin Assessment: Skin Integrity Issues: Skin Integrity Issues:: DTI, Stage I DTI: right ankle, left ankle Stage I: buttocks  Last BM:  09/30/23 (type 7)  Height:   Ht Readings from Last 1 Encounters:  09/22/24 5' 9 (1.753 m)    Weight:   Wt Readings from Last 1 Encounters:  09/29/24 82.6 kg    Ideal Body Weight:  72.7 kg  BMI:  Body mass index is 26.89 kg/m.  Estimated Nutritional Needs:   Kcal:  1900-2100 kcal/d  Protein:  90-110g/d  Fluid:  >2L/d    Margery ORN, RD, LDN, CDCES Registered Dietitian III Certified Diabetes Care and Education Specialist If unable to reach this RD, please use RD Inpatient group chat on secure chat between hours of 8am-4 pm daily

## 2024-09-29 NOTE — Progress Notes (Signed)
 "                                                                                                                                                                                                          Daily Progress Note   Patient Name: Rodney Estrada       Date: 09/29/2024 DOB: 06-Mar-1951  Age: 74 y.o. MRN#: [id number] Attending Physician: Von Bellis, MD Primary Care Physician: Pcp, No Admit Date: 09/22/2024  Reason for Consultation/Follow-up: Establishing goals of care  Subjective: Notes and labs reviewed.  He is currently resting in bed at this time with eyes closed.  He opens eyes to look at me upon speaking to him, but he does not speak at all, and closes his eyes again.  No family at bedside.  Per notes, no current need for dialysis.  PMT will check in later this week, and otherwise continue to follow peripherally.  Length of Stay: 7  Current Medications: Scheduled Meds:   ARIPiprazole   2 mg Per Tube QHS   artificial tears  1 drop Both Eyes QHS   vitamin C   500 mg Per Tube BID   aspirin   81 mg Per Tube Daily   clopidogrel   75 mg Per Tube Daily   cyanocobalamin   1,000 mcg Intramuscular Daily   Followed by   NOREEN ON 10/02/2024] vitamin B-12  1,000 mcg Per Tube Daily   ergocalciferol  (VITAMIN D2)  50,000 Units Per Tube Weekly   feeding supplement (PROSource TF20)  60 mL Per Tube Daily   fiber supplement (BANATROL TF)  60 mL Per Tube BID   folic acid   1 mg Per Tube Daily   free water   300 mL Per Tube Q4H   guaiFENesin   10 mL Per Tube Q4H   heparin  injection (subcutaneous)  5,000 Units Subcutaneous Q8H   insulin  aspart  0-9 Units Subcutaneous Q4H   levETIRAcetam   500 mg Per Tube Q24H   Or   levETIRAcetam   500 mg Intravenous Q24H   multivitamin with minerals  1 tablet Per Tube Daily   mupirocin  ointment  1 Application Nasal BID   omeprazole   20 mg Per Tube BID   mouth rinse  15 mL Mouth Rinse 4 times per day   phosphorus  500 mg Per NG tube QID   senna-docusate  2 tablet Per  Tube QHS   sertraline   175 mg Per Tube Daily   thiamine   100 mg Per Tube Daily   zinc  sulfate (50mg  elemental zinc )  220 mg Per Tube Daily    Continuous  Infusions:  dextrose  5 % and 0.45 % NaCl with KCl 40 mEq/L Stopped (09/29/24 1355)   ertapenem  500 mg (09/29/24 1357)   feeding supplement (OSMOLITE 1.5 CAL) 1,000 mL (09/28/24 2332)    PRN Meds: acetaminophen  **OR** acetaminophen , camphor-menthol , ipratropium-albuterol , lactulose , loperamide  HCl, ondansetron  **OR** ondansetron  (ZOFRAN ) IV, mouth rinse  Physical Exam Pulmonary:     Effort: Pulmonary effort is normal.  Skin:    General: Skin is warm and dry.  Neurological:     Mental Status: He is alert.             Vital Signs: BP 134/69 (BP Location: Right Arm)   Pulse 67   Temp 98.9 F (37.2 C) (Oral)   Resp 20   Ht 5' 9 (1.753 m)   Wt 82.6 kg   SpO2 94%   BMI 26.89 kg/m  SpO2: SpO2: 94 % O2 Device: O2 Device: Room Air O2 Flow Rate: O2 Flow Rate (L/min): 1 L/min  Intake/output summary:  Intake/Output Summary (Last 24 hours) at 09/29/2024 1441 Last data filed at 09/29/2024 1200 Gross per 24 hour  Intake 3611.44 ml  Output 2600 ml  Net 1011.44 ml   LBM: Last BM Date : 09/28/24 Baseline Weight: Weight: 81.1 kg Most recent weight: Weight: 82.6 kg    Patient Active Problem List   Diagnosis Date Noted   Malnutrition of moderate degree 09/26/2024   Pyelonephritis 09/26/2024   Uremic encephalopathy 09/26/2024   Elevated troponin 09/23/2024   Acute metabolic encephalopathy 09/22/2024   Acute lower UTI 09/22/2024   Acute kidney injury superimposed on chronic kidney disease 09/22/2024   Type 2 diabetes mellitus with peripheral neuropathy (HCC) 09/22/2024   Seizure disorder (HCC) 09/22/2024   GERD without esophagitis 09/22/2024   Depression 09/22/2024   Gout 09/22/2024   Dyslipidemia 09/22/2024   Hyperkalemia 09/22/2024   Coronary artery disease 09/22/2024   Hypernatremia 07/31/2022   Hiccups 07/28/2022    Hypokalemia 07/27/2022   SBO (small bowel obstruction) (HCC) 07/23/2022   Nephrolithiasis 07/23/2022   Partial intestinal obstruction (HCC)    Cerebral edema (HCC) 04/16/2020   Hypertensive emergency 04/16/2020   Essential hypertension 04/16/2020   Hyperlipidemia LDL goal <70 04/16/2020   Type 2 diabetes mellitus (HCC) 04/16/2020   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  No current needs for dialysis. PMT will check in later this week and otherwise follow peripherally.  Code Status:    Code Status Orders  (From admission, onward)           Start     Ordered   09/23/24 0543  Do not attempt resuscitation (DNR)- Limited -Do Not Intubate (DNI)  (Code Status)  Continuous       Question Answer Comment  If pulseless and not breathing No CPR or chest compressions.   In Pre-Arrest Conditions (Patient Is Breathing and Has A Pulse) Do not intubate. Provide all appropriate non-invasive medical interventions. Avoid ICU transfer unless indicated or required.   Consent: Discussion documented in EHR or advanced directives reviewed      09/23/24 0542  Code Status History     Date Active Date Inactive Code Status Order ID Comments User Context   09/22/2024 2108 09/23/2024 0542 Full Code [id number]  Lawence Madison LABOR, MD ED   07/23/2022 1425 08/08/2022 0354 Full Code [id number]  Arnett Saunders, MD ED   04/05/2020 1121 04/19/2020 2338 Full Code [id number]  Claudene Alm SAUNDERS, PA-C ED       Camelia Lewis, NP  Please contact Palliative Medicine Team phone at [phone] for questions and  concerns.       "

## 2024-09-29 NOTE — Progress Notes (Signed)
 "  Date of Admission:  09/22/2024     ID: Rodney Estrada is a 74 y.o. male  Principal Problem:   Acute metabolic encephalopathy Active Problems:   Essential hypertension   Acute lower UTI   Acute kidney injury superimposed on chronic kidney disease   Type 2 diabetes mellitus with peripheral neuropathy (HCC)   Seizure disorder (HCC)   GERD without esophagitis   Depression   Gout   Dyslipidemia   Hyperkalemia   Coronary artery disease   Elevated troponin   Malnutrition of moderate degree   Pyelonephritis   Uremic encephalopathy  Rodney Estrada is a 74 y.o. male with a history of nephrolithiasis,left hydronephrosis s/p lithotripsy h/o ESBl Ecoli in 2023, CKD, CVA  presents from SNF on 09/22/24 with altered mental status   Subjective: Pt is more alert Responds to questions But non verbal  Medications:   ARIPiprazole   2 mg Per Tube QHS   artificial tears  1 drop Both Eyes QHS   vitamin C   500 mg Per Tube BID   aspirin   81 mg Per Tube Daily   clopidogrel   75 mg Per Tube Daily   cyanocobalamin   1,000 mcg Intramuscular Daily   Followed by   NOREEN ON 10/02/2024] vitamin B-12  1,000 mcg Per Tube Daily   ergocalciferol  (VITAMIN D2)  50,000 Units Per Tube Weekly   feeding supplement (PROSource TF20)  60 mL Per Tube Daily   fiber supplement (BANATROL TF)  60 mL Per Tube BID   folic acid   1 mg Per Tube Daily   free water   300 mL Per Tube Q4H   guaiFENesin   10 mL Per Tube Q4H   heparin  injection (subcutaneous)  5,000 Units Subcutaneous Q8H   insulin  aspart  0-9 Units Subcutaneous Q4H   levETIRAcetam   500 mg Per Tube Q24H   Or   levETIRAcetam   500 mg Intravenous Q24H   multivitamin with minerals  1 tablet Per Tube Daily   mupirocin  ointment  1 Application Nasal BID   omeprazole   20 mg Per Tube BID   mouth rinse  15 mL Mouth Rinse 4 times per day   phosphorus  500 mg Per NG tube QID   senna-docusate  2 tablet Per Tube QHS   sertraline   175 mg Per Tube Daily   thiamine   100 mg Per  Tube Daily   zinc  sulfate (50mg  elemental zinc )  220 mg Per Tube Daily    Objective: Vital signs in last 24 hours: Patient Vitals for the past 24 hrs:  BP Temp Temp src Pulse Resp SpO2 Weight  09/29/24 0741 (!) 140/68 98 F (36.7 C) Axillary 63 -- 97 % --  09/29/24 0500 -- -- -- -- -- -- 82.6 kg  09/29/24 0335 134/72 98.1 F (36.7 C) Oral 70 20 98 % --  09/28/24 2330 (!) 142/71 98 F (36.7 C) Oral 66 18 100 % --  09/28/24 2000 (!) 147/64 98.1 F (36.7 C) Oral 64 20 96 % --  09/28/24 1649 (!) 154/68 98.2 F (36.8 C) -- 68 18 100 % --     PHYSICAL EXAM:  General: Alert, cooperative, no distress, repsonds to questions by gestures Lungs: Clear to auscultation bilaterally. No Wheezing or Rhonchi. No rales. Heart: Regular rate and rhythm, no murmur, rub or gallop. Abdomen: Soft, non-tender,not distended. Bowel sounds normal. No masses Extremities: atraumatic, no cyanosis. No edema. No clubbing Skin: No rashes or lesions. Or bruising Lymph: Cervical, supraclavicular normal. Neurologic: left hemiplegia Aphasia??  Lab Results    Latest Ref Rng & Units 09/29/2024    4:31 AM 09/28/2024    4:21 AM 09/27/2024    4:23 AM  CBC  WBC 4.0 - 10.5 K/uL 8.1  6.5  5.6   Hemoglobin 13.0 - 17.0 g/dL 9.8  9.7  9.7   Hematocrit 39.0 - 52.0 % 30.5  30.0  29.7   Platelets 150 - 400 K/uL 142  125  120        Latest Ref Rng & Units 09/29/2024    4:31 AM 09/28/2024    3:58 PM 09/28/2024    4:21 AM  CMP  Glucose 70 - 99 mg/dL 830  839  826   BUN 8 - 23 mg/dL 63  70  74   Creatinine 0.61 - 1.24 mg/dL 6.83  6.77  6.49   Sodium 135 - 145 mmol/L 148  150  152   Potassium 3.5 - 5.1 mmol/L 4.0  3.9  3.2   Chloride 98 - 111 mmol/L 110  110  109   CO2 22 - 32 mmol/L 27  27  28    Calcium  8.9 - 10.3 mg/dL 8.3  7.8  7.9       Microbiology: [facility] 09/22/24- staphylococci epidermidis 09/24/24 BC- NG Studies/Results: No results found.   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

## 2024-09-29 NOTE — Progress Notes (Signed)
 [facility] [place], [place] 09/29/2024  Subjective:   Hospital day # 7  Patient seen laying in bed Grunts in response to simple questions  Creatinine 3.16 Urine output Tube feeds 75ml/hr  Objective:  Vital signs in last 24 hours:  Temp:  [98 F (36.7 C)-98.9 F (37.2 C)] 98.9 F (37.2 C) (01/05 1133) Pulse Rate:  [63-70] 67 (01/05 1133) Resp:  [18-20] 20 (01/05 0335) BP: (134-154)/(64-72) 134/69 (01/05 1133) SpO2:  [94 %-100 %] 94 % (01/05 1133) Weight:  [82.6 kg] 82.6 kg (01/05 0500)  Weight change: -0.1 kg Filed Weights   09/27/24 0500 09/28/24 0500 09/29/24 0500  Weight: 81.8 kg 82.7 kg 82.6 kg    Intake/Output:    Intake/Output Summary (Last 24 hours) at 09/29/2024 1255 Last data filed at 09/29/2024 1133 Gross per 24 hour  Intake 3311.44 ml  Output 3200 ml  Net 111.44 ml     Physical Exam: General: Chronically ill-appearing  HEENT [place] O2, NGT  Pulm/lungs Clear at bases  CVS/Heart Regular rate and rhythm  Abdomen:  Soft, nontender, nondistended  Extremities: Trace dependent edema  Neurologic: Arouseable   Skin: Warm, dry   External urinary catheter in place       Basic Metabolic Panel:  Recent Labs  Lab 09/24/24 0955 09/25/24 0310 09/25/24 1411 09/26/24 0320 09/27/24 0423 09/27/24 1954 09/28/24 0421 09/28/24 1558 09/29/24 0431  NA  --  142   < > 145 146* 149* 152* 150* 148*  K  --  4.2   < > 3.9 3.1* 3.1* 3.2* 3.9 4.0  CL  --  100   < > 102 105 107 109 110 110  CO2  --  17*   < > 20* 27 27 28 27 27   GLUCOSE  --  62*   < > 108* 188* 217* 173* 160* 169*  BUN  --  111*   < > 102* 84* 79* 74* 70* 63*  CREATININE  --  5.70*   < > 5.04* 4.16* 3.63* 3.50* 3.22* 3.16*  CALCIUM   --  7.8*   < > 8.0* 8.1* 7.7* 7.9* 7.8* 8.3*  MG 2.6* 2.3  --  2.3 2.2  --  2.0  --   --   PHOS 8.3* 5.9*  --  4.6 3.5  --  2.7  --  1.3*   < > = values in this interval not displayed.     CBC: Recent Labs  Lab 09/22/24 1758 09/23/24 0628  09/25/24 0310 09/26/24 0320 09/27/24 0423 09/28/24 0421 09/29/24 0431  WBC 11.4*   < > 8.6 5.9 5.6 6.5 8.1  NEUTROABS 9.1*  --   --   --   --   --   --   HGB 11.1*   < > 9.9* 9.8* 9.7* 9.7* 9.8*  HCT 34.9*   < > 31.0* 30.0* 29.7* 30.0* 30.5*  MCV 94.8   < > 93.4 92.6 91.4 91.7 92.7  PLT 194   < > 129* 122* 120* 125* 142*   < > = values in this interval not displayed.     No results found for: HEPBSAG, HEPBSAB, HEPBIGM    Microbiology:  Recent Results (from the past 240 hours)  Culture, blood (routine x 2)     Status: Abnormal   Collection Time: 09/22/24  5:37 PM   Specimen: BLOOD LEFT HAND  Result Value Ref Range Status   Specimen Description   Final    BLOOD LEFT HAND Performed at  [facility], [address], [place], [place] [address]    Special Requests   Final    BOTTLES DRAWN AEROBIC AND ANAEROBIC Blood Culture results may not be optimal due to an inadequate volume of blood received in culture bottles Performed at [facility], [address], [place], [place] [address]    Culture  Setup Time   Final    GRAM POSITIVE COCCI AEROBIC BOTTLE ONLY CRITICAL VALUE NOTED.  VALUE IS CONSISTENT WITH PREVIOUSLY REPORTED AND CALLED VALUE. Performed at [facility], [address] [place], [place] [address]    Culture STAPHYLOCOCCUS EPIDERMIDIS (A)  Final   Report Status 09/25/2024 FINAL  Final   Organism ID, Bacteria STAPHYLOCOCCUS EPIDERMIDIS  Final      Susceptibility   Staphylococcus epidermidis - MIC*    CIPROFLOXACIN 4 RESISTANT Resistant     ERYTHROMYCIN >=8 RESISTANT Resistant     GENTAMICIN  >=16 RESISTANT Resistant     OXACILLIN >=4 RESISTANT Resistant     TETRACYCLINE <=1 SENSITIVE Sensitive     VANCOMYCIN  2 SENSITIVE Sensitive     TRIMETH/SULFA 160 RESISTANT Resistant     CLINDAMYCIN >=8 RESISTANT Resistant     RIFAMPIN <=0.5 SENSITIVE Sensitive     Inducible Clindamycin NEGATIVE Sensitive     * STAPHYLOCOCCUS EPIDERMIDIS   Culture, blood (routine x 2)     Status: Abnormal   Collection Time: 09/22/24  5:57 PM   Specimen: Right Antecubital; Blood  Result Value Ref Range Status   Specimen Description   Final    RIGHT ANTECUBITAL Performed at [facility], [address] [place], [place] [address]    Special Requests   Final    BOTTLES DRAWN AEROBIC AND ANAEROBIC Blood Culture results may not be optimal due to an inadequate volume of blood received in culture bottles Performed at [facility], [address] [place], [place] [address]    Culture  Setup Time   Final    GRAM POSITIVE COCCI ANAEROBIC BOTTLE ONLY CRITICAL RESULT CALLED TO, READ BACK BY AND VERIFIED WITH: EMILY STEINBOCK 09/23/24 1424 KLW    Culture STAPHYLOCOCCUS EPIDERMIDIS (A)  Final   Report Status 09/25/2024 FINAL  Final   Organism ID, Bacteria STAPHYLOCOCCUS EPIDERMIDIS  Final      Susceptibility   Staphylococcus epidermidis - MIC*    CIPROFLOXACIN <=0.5 SENSITIVE Sensitive     ERYTHROMYCIN >=8 RESISTANT Resistant     GENTAMICIN  >=16 RESISTANT Resistant     OXACILLIN >=4 RESISTANT Resistant     TETRACYCLINE <=1 SENSITIVE Sensitive     VANCOMYCIN  1 SENSITIVE Sensitive     TRIMETH/SULFA 160 RESISTANT Resistant     CLINDAMYCIN <=0.25 SENSITIVE Sensitive     RIFAMPIN <=0.5 SENSITIVE Sensitive     Inducible Clindamycin NEGATIVE Sensitive     * STAPHYLOCOCCUS EPIDERMIDIS  Blood Culture ID Panel (Reflexed)     Status: Abnormal   Collection Time: 09/22/24  5:57 PM  Result Value Ref Range Status   Enterococcus faecalis NOT DETECTED NOT DETECTED Final   Enterococcus Faecium NOT DETECTED NOT DETECTED Final   Listeria monocytogenes NOT DETECTED NOT DETECTED Final   Staphylococcus species DETECTED (A) NOT DETECTED Final    Comment: CRITICAL RESULT CALLED TO, READ BACK BY AND VERIFIED WITH: EMILY STEINBOCK 09/23/24 1424 KLW    Staphylococcus aureus (BCID) NOT DETECTED NOT DETECTED Final   Staphylococcus epidermidis  DETECTED (A) NOT DETECTED Final    Comment: Methicillin (oxacillin) resistant coagulase negative staphylococcus. Possible blood culture  contaminant (unless isolated from more than one blood culture draw or clinical case suggests pathogenicity). No antibiotic treatment is indicated for blood  culture contaminants. CRITICAL RESULT CALLED TO, READ BACK BY AND VERIFIED WITH: EMILY STEINBOCK 09/23/24 1424 KLW    Staphylococcus lugdunensis NOT DETECTED NOT DETECTED Final   Streptococcus species NOT DETECTED NOT DETECTED Final   Streptococcus agalactiae NOT DETECTED NOT DETECTED Final   Streptococcus pneumoniae NOT DETECTED NOT DETECTED Final   Streptococcus pyogenes NOT DETECTED NOT DETECTED Final   A.calcoaceticus-baumannii NOT DETECTED NOT DETECTED Final   Bacteroides fragilis NOT DETECTED NOT DETECTED Final   Enterobacterales NOT DETECTED NOT DETECTED Final   Enterobacter cloacae complex NOT DETECTED NOT DETECTED Final   Escherichia coli NOT DETECTED NOT DETECTED Final   Klebsiella aerogenes NOT DETECTED NOT DETECTED Final   Klebsiella oxytoca NOT DETECTED NOT DETECTED Final   Klebsiella pneumoniae NOT DETECTED NOT DETECTED Final   Proteus species NOT DETECTED NOT DETECTED Final   Salmonella species NOT DETECTED NOT DETECTED Final   Serratia marcescens NOT DETECTED NOT DETECTED Final   Haemophilus influenzae NOT DETECTED NOT DETECTED Final   Neisseria meningitidis NOT DETECTED NOT DETECTED Final   Pseudomonas aeruginosa NOT DETECTED NOT DETECTED Final   Stenotrophomonas maltophilia NOT DETECTED NOT DETECTED Final   Candida albicans NOT DETECTED NOT DETECTED Final   Candida auris NOT DETECTED NOT DETECTED Final   Candida glabrata NOT DETECTED NOT DETECTED Final   Candida krusei NOT DETECTED NOT DETECTED Final   Candida parapsilosis NOT DETECTED NOT DETECTED Final   Candida tropicalis NOT DETECTED NOT DETECTED Final   Cryptococcus neoformans/gattii NOT DETECTED NOT DETECTED Final    Methicillin resistance mecA/C DETECTED (A) NOT DETECTED Final    Comment: CRITICAL RESULT CALLED TO, READ BACK BY AND VERIFIED WITH: EMILY STEINBOCK 09/23/24 1424 KLW Performed at [facility], [address] [place], [place] [address]   Resp panel by RT-PCR (RSV, Flu A&B, Covid) Anterior Nasal Swab     Status: None   Collection Time: 09/22/24  5:58 PM   Specimen: Anterior Nasal Swab  Result Value Ref Range Status   SARS Coronavirus 2 by RT PCR NEGATIVE NEGATIVE Final    Comment: (NOTE) SARS-CoV-2 target nucleic acids are NOT DETECTED.  The SARS-CoV-2 RNA is generally detectable in upper respiratory specimens during the acute phase of infection. The lowest concentration of SARS-CoV-2 viral copies this assay can detect is 138 copies/mL. A negative result does not preclude SARS-Cov-2 infection and should not be used as the sole basis for treatment or other patient management decisions. A negative result may occur with  improper specimen collection/handling, submission of specimen other than nasopharyngeal swab, presence of viral mutation(s) within the areas targeted by this assay, and inadequate number of viral copies(<138 copies/mL). A negative result must be combined with clinical observations, patient history, and epidemiological information. The expected result is Negative.  Fact Sheet for Patients:  bloggercourse.com  Fact Sheet for Healthcare Providers:  seriousbroker.it  This test is no t yet approved or cleared by the [place]  FDA and  has been authorized for detection and/or diagnosis of SARS-CoV-2 by FDA under an Emergency Use Authorization (EUA). This EUA will remain  in effect (meaning this test can be used) for the duration of the COVID-19 declaration under Section 564(b)(1) of the Act, 21 U.S.C.section 360bbb-3(b)(1), unless the authorization is terminated  or revoked sooner.       Influenza A by  PCR NEGATIVE NEGATIVE Final   Influenza  B by PCR NEGATIVE NEGATIVE Final    Comment: (NOTE) The Xpert Xpress SARS-CoV-2/FLU/RSV plus assay is intended as an aid in the diagnosis of influenza from Nasopharyngeal swab specimens and should not be used as a sole basis for treatment. Nasal washings and aspirates are unacceptable for Xpert Xpress SARS-CoV-2/FLU/RSV testing.  Fact Sheet for Patients: bloggercourse.com  Fact Sheet for Healthcare Providers: seriousbroker.it  This test is not yet approved or cleared by the [place]  FDA and has been authorized for detection and/or diagnosis of SARS-CoV-2 by FDA under an Emergency Use Authorization (EUA). This EUA will remain in effect (meaning this test can be used) for the duration of the COVID-19 declaration under Section 564(b)(1) of the Act, 21 U.S.C. section 360bbb-3(b)(1), unless the authorization is terminated or revoked.     Resp Syncytial Virus by PCR NEGATIVE NEGATIVE Final    Comment: (NOTE) Fact Sheet for Patients: bloggercourse.com  Fact Sheet for Healthcare Providers: seriousbroker.it  This test is not yet approved or cleared by the [place]  FDA and has been authorized for detection and/or diagnosis of SARS-CoV-2 by FDA under an Emergency Use Authorization (EUA). This EUA will remain in effect (meaning this test can be used) for the duration of the COVID-19 declaration under Section 564(b)(1) of the Act, 21 U.S.C. section 360bbb-3(b)(1), unless the authorization is terminated or revoked.  Performed at [facility], [address] [place], [place] [address]   Culture, blood (Routine X 2) w Reflex to ID Panel     Status: None   Collection Time: 09/24/24  6:18 PM   Specimen: BLOOD  Result Value Ref Range Status   Specimen Description BLOOD BLOOD RIGHT HAND  Final   Special Requests   Final    BOTTLES  DRAWN AEROBIC AND ANAEROBIC Blood Culture adequate volume   Culture   Final    NO GROWTH 5 DAYS Performed at [facility], [address] [place], [place] [address]    Report Status 09/29/2024 FINAL  Final  MRSA Next Gen by PCR, Nasal     Status: Abnormal   Collection Time: 09/24/24  6:18 PM   Specimen: Nasal Mucosa; Nasal Swab  Result Value Ref Range Status   MRSA by PCR Next Gen DETECTED (A) NOT DETECTED Final    Comment: RESULT CALLED TO, READ BACK BY AND VERIFIED WITH:  GEORGINA OROZCO AT 2345 09/24/24 JG (NOTE) The GeneXpert MRSA Assay (FDA approved for NASAL specimens only), is one component of a comprehensive MRSA colonization surveillance program. It is not intended to diagnose MRSA infection nor to guide or monitor treatment for MRSA infections. Test performance is not FDA approved in patients less than 27 years old. Performed at [facility], [address] [place], [place] [address]   Culture, blood (Routine X 2) w Reflex to ID Panel     Status: None   Collection Time: 09/24/24  9:16 PM   Specimen: BLOOD  Result Value Ref Range Status   Specimen Description BLOOD BLOOD RIGHT HAND  Final   Special Requests   Final    BOTTLES DRAWN AEROBIC AND ANAEROBIC Blood Culture results may not be optimal due to an inadequate volume of blood received in culture bottles   Culture   Final    NO GROWTH 5 DAYS Performed at [facility], [address], [place], [place] [address]    Report Status 09/29/2024 FINAL  Final  Urine Culture (for pregnant, neutropenic or urologic patients or patients with an indwelling  urinary catheter)     Status: None   Collection Time: 09/25/24 10:03 AM   Specimen: Urine, Clean Catch  Result Value Ref Range Status   Specimen Description   Final    URINE, CLEAN CATCH Performed at [facility], [address] [place], [place] [address]    Special Requests   Final    NONE Performed at [facility], [address], [place], [place] [address]    Culture   Final    NO GROWTH Performed at [facility], [address] [place], [place] [address]    Report Status 09/26/2024 FINAL  Final  Gastrointestinal Panel by PCR , Stool     Status: None   Collection Time: 09/28/24  1:33 PM   Specimen: Stool  Result Value Ref Range Status   Campylobacter species NOT DETECTED NOT DETECTED Final   Plesimonas shigelloides NOT DETECTED NOT DETECTED Final   Salmonella species NOT DETECTED NOT DETECTED Final   Yersinia enterocolitica NOT DETECTED NOT DETECTED Final   Vibrio species NOT DETECTED NOT DETECTED Final   Vibrio cholerae NOT DETECTED NOT DETECTED Final   Enteroaggregative E coli (EAEC) NOT DETECTED NOT DETECTED Final   Enteropathogenic E coli (EPEC) NOT DETECTED NOT DETECTED Final   Enterotoxigenic E coli (ETEC) NOT DETECTED NOT DETECTED Final   Shiga like toxin producing E coli (STEC) NOT DETECTED NOT DETECTED Final   Shigella/Enteroinvasive E coli (EIEC) NOT DETECTED NOT DETECTED Final   Cryptosporidium NOT DETECTED NOT DETECTED Final   Cyclospora cayetanensis NOT DETECTED NOT DETECTED Final   Entamoeba histolytica NOT DETECTED NOT DETECTED Final   Giardia lamblia NOT DETECTED NOT DETECTED Final   Adenovirus F40/41 NOT DETECTED NOT DETECTED Final   Astrovirus NOT DETECTED NOT DETECTED Final   Norovirus GI/GII NOT DETECTED NOT DETECTED Final   Rotavirus A NOT DETECTED NOT DETECTED Final   Sapovirus (I, II, IV, and V) NOT DETECTED NOT DETECTED Final    Comment: Performed at [facility], [address] [place], [place] [address]    Coagulation Studies: No results for input(s): LABPROT, INR in the last 72 hours.  Urinalysis: No results for input(s): COLORURINE, LABSPEC, PHURINE, GLUCOSEU, HGBUR, BILIRUBINUR, KETONESUR, PROTEINUR, UROBILINOGEN, NITRITE, LEUKOCYTESUR in the last 72 hours.  Invalid input(s): APPERANCEUR     Imaging: No results  found.    Medications:    dextrose  5 % and 0.45 % NaCl with KCl 40 mEq/L 50 mL/hr at 09/29/24 0831   ertapenem  Stopped (09/28/24 1357)   feeding supplement (OSMOLITE 1.5 CAL) 1,000 mL (09/28/24 2332)    ARIPiprazole   2 mg Per Tube QHS   artificial tears  1 drop Both Eyes QHS   vitamin C   500 mg Per Tube BID   aspirin   81 mg Per Tube Daily   clopidogrel   75 mg Per Tube Daily   cyanocobalamin   1,000 mcg Intramuscular Daily   Followed by   NOREEN ON 10/02/2024] vitamin B-12  1,000 mcg Per Tube Daily   ergocalciferol  (VITAMIN D2)  50,000 Units Per Tube Weekly   feeding supplement (PROSource TF20)  60 mL Per Tube Daily   fiber supplement (BANATROL TF)  60 mL Per Tube BID   folic acid   1 mg Per Tube Daily   free water   300 mL Per Tube Q4H   guaiFENesin   10 mL Per Tube Q4H   heparin  injection (subcutaneous)  5,000 Units Subcutaneous Q8H   insulin  aspart  0-9 Units Subcutaneous Q4H  levETIRAcetam   500 mg Per Tube Q24H   Or   levETIRAcetam   500 mg Intravenous Q24H   multivitamin with minerals  1 tablet Per Tube Daily   mupirocin  ointment  1 Application Nasal BID   omeprazole   20 mg Per Tube BID   mouth rinse  15 mL Mouth Rinse 4 times per day   phosphorus  500 mg Per NG tube QID   senna-docusate  2 tablet Per Tube QHS   sertraline   175 mg Per Tube Daily   thiamine   100 mg Per Tube Daily   zinc  sulfate (50mg  elemental zinc )  220 mg Per Tube Daily   acetaminophen  **OR** acetaminophen , camphor-menthol , ipratropium-albuterol , lactulose , loperamide  HCl, ondansetron  **OR** ondansetron  (ZOFRAN ) IV, mouth rinse  Assessment/ Plan:  74 y.o. male with with multiple medical problems including history of stroke, peripheral vascular disease, hypertension, type 2 diabetes admitted on 09/22/2024 for Acute metabolic encephalopathy [G93.41]   Acute kidney injury Chronic kidney disease, stage IIIa.  Creatinine 1.54/GFR 49 from 08/04/2022 Diabetes type 2 with chronic kidney disease History of  chronic left hydronephrosis  history of kidney stones and left ureteral stent placement and removal in February 2024 Acute metabolic encephalopathy Acute metabolic acidosis   Patient has severe acute kidney injury.  He has history of kidney stones, left hydronephrosis and left ureteral stent placement.  Renal ultrasound from 09/24/2024 is negative for hydronephrosis.  Nonobstructing calculi in both kidneys Mentation has improved. Creatinine continues to improve also. No acute indication for dialysis.  Sodium bicarb has corrected   Hemoglobin A1c 5.6% from 09/22/2024.  Management as as per primary team.  Urinary tract infection-remains on ertapenem     LOS: 7 Faith Harris 1/5/202612:55 PM  [facility] [place], [place] [phone]

## 2024-09-29 NOTE — Progress Notes (Signed)
 [facility] Progress Note  Patient: Rodney Estrada    FMW:[id number]  DOA: 09/22/2024     Date of Service: the patient was seen and examined on 09/29/2024  No chief complaint on file.  Brief hospital course:  from H&P - Gregg Winchell is a 74 y.o. male with medical history significant for coronary artery disease, chronic systolic CHF, gout, hypertension, dyslipidemia, atrial fibrillation and CVA as well as type 2 diabetes mellitus who presented to the emergency room 12/29 with acute onset of altered mental status with confusion and decreased responsiveness since Friday 12/26 at his SNF.  He has been nonverbal with increased tremors.  No reported fever or chills.  SNF staff noted congested cough and therefore the patient was given IM Rocephin  this afternoon.  The patient was nonverbal during admitting hospitalist's interview and would answer some questions nodding his head.  No chest pain or palpitations were reported.  No reported nausea or vomiting or diarrhea or abdominal pain.     12/29: to ED. (+)UTI, started on IV fluids, vanc, cefepime . Admitted to hospitalist for acute encephalopathy d/t UTI, AKI on CKD 3a assoc w/ hyperkalemia. Concern w/ elevation troponins, heparin  gtt started.  12/30: holding heparin  gtt given hematuria. Cardiology following, in agreement. Echo pending. Pt remains confused. Echo pending.          Consultants:  Cardiology signed off on 12/31 Nephrology   Procedures/Surgeries: None   Assessment and Plan:  # Severe sepsis secondary to UTI Leukocytosis, tachycardia, tachypneic and hypotensive on 12/31 Patient responded well to IV fluid, did not require Levophed  Transferred to ICU, lactic acid within normal range and vitals improved after IV fluid so transferred to progressive unit.  cortisol level wnl S/p vancomycin , trough level was high, DC'd S/p ceftriaxone  till 1/2 12/31 NS 500 mL bolus and LR 500 bolus, 1/1 LR 500 mL bolus PCCM consulted, LR 500 bolus  ordered, if no improvement then patient will need Levophed . 1/2 started Invanz  as per ID 1/3 started daptomycin  as per ID, pharmacy consulted for dose  # Acute metabolic encephalopathy secondary to uremia MRI brain pending radiology read - appears stable from previous on personal review  Continue neurochecks  keppra  levels elevated, pharmacy consulted for dose and monitor Hold gabapentin  (300 mg tid home dose) 1/4 awake and alert today, opened his eyes and able to tell me his name.  1/5 SLP consulted to eval for swallowing to start diet  # AKI on CKD stage III # Metabolic encephalopathy secondary to uremia # Metabolic acidosis History of chronic left hydronephrosis  history of kidney stones and left ureteral stent placement and removal in February 2024 Bicarbonate 100 mEq IV one-time push given Started bicarb IV infusion Nephrology consulted for urgent hemodialysis Palliative care consulted, as per nephro patient may not be able to get hemodialysis done as an outpatient due to comorbidities. 1/2 discussed with nephrology again today to start hemodialysis as patient's mental status is not improving due to uremia Nephrology will revisit tomorrow and consider hemodialysis tomorrow a.m. if needed 1/3 metabolic acidosis resolved, bicarb infusion discontinued.  Renal functions are improving, nephrology recommended no need of urgent hemodialysis and continue to monitor. Started free water  due to elevated sodium 1/5 sCr 3.16, BUN 63, gradually improved   # Acute lower UTI S/p IV Rocephin   1/1 repeated urine cultures negative 1/2 started ertapenem  1/3 started daptomycin    # Staph epidermidis blood culture positive Possible contamination Urine culture negative S/p vancomycin , DC'd due to renal failure 1/2 started  Invanz  1/3 started daptomycin  ID consult appreciated    # Elevated troponin / Type II  likely demand ischemia  S/p IV heparin  dc TTE LVEF >55, Left ventricular  endocardial border not optimally defined to evaluate regional wall motion, moderate LVH. Cardiology consulted recommended no intervention.  Signed off on 12/31     # Gross hematuria: Resolved  Question due to UTI Monitor CBC   # Hyperkalemia: Resolved  D/t AKI Treat underlying cause(s) as noted  S/p lokelma . Follow BMP  # Hypokalemia 1/3 low potassium 3.1, repleted with IV fluid 1/4 K 3.2 low, potassium repleted Monitor electrolytes daily  # Hypophosphatemia, Phos repleted. Monitor electrolytes daily and replete as needed.   # Essential hypertension but low BP due to sepsis Hold amlodipine , lisinopril     # Coronary artery disease ASA, Plavix , statin Hold antiypertensives    Gout Hold allopurinol  w/ AKI   Depression Continue Zoloft  home dose 175 mg daily --> 150  Abilify     GERD without esophagitis PPI   Seizure disorder continue Keppra  Check keppra  levels    Constipation Bowel regimen per orders   Type 2 diabetes mellitus with peripheral neuropathy  Not on home antihyperglycemics No glucosuria Holding SSI Monitor Glc achs for now and initiate insulin  if needed  A1C hold Neurontin  w/ AKI and encephalopathy   Prednisone rx outpatient started 12/26 20 mg daily NOT chronic medication Uncertain why but suspect as above he was treated for presumed respiratory illness at faciltiy - will dc this  cortisol level wnl  # Folic acid  deficiency: started folic acid  1 mg p.o. daily.  Follow-up with PCP to repeat folic acid  level in between 3 to 6 months.  1/5 gradually patient's mental status is improving with improvement in renal functions, creatinine and BUN.  Palliative care following for goals of care discussion    Body mass index is 26.89 kg/m.  Interventions:  Diet: NPO/continue NG tube feeds DVT Prophylaxis: Subcutaneous Heparin     Advance goals of care discussion: DNR-limited  Family Communication: family was not present at bedside, at the time of  interview.  The pt provided permission to discuss medical plan with the family. Opportunity was given to ask question and all questions were answered satisfactorily.  12/31 called and left a VM for his sister Rodney Estrada [phone]  09/25/24 I spoke to patient's sister today and updated the management plan and his current situation, poor prognosis. 1/3 I spoke to patient's Estrada Rhoda, and updated her regarding his brother's condition.   Disposition:  Pt is from SNF, admitted with sepsis, AMS, AKI, uremia, still critically ill, which precludes a safe discharge. Discharge to SNF, when stable, may need few days to improve.  Subjective: No significant events overnight.  Patient is awake, he was able to tell me his name, and trying to talk more but hard to understand Did not offer any complaints.  Will get SLP eval today to see if patient is able to swallow   Physical Exam: General: NAD, resting comfortably Eyes: PERRLA ENT: Oral Mucosa Clear, and dry Neck: no JVD,  Cardiovascular: S1 and S2 Present, no Murmur,  Respiratory: good respiratory effort, Bilateral Air entry equal and Decreased, no Crackles, no wheezes Abdomen: Bowel Sound present, Soft and no tenderness,  Skin: no rashes Extremities: no Pedal edema, no calf tenderness Neurologic: Left-sided residual weakness due to prior CVA.  AO x 1, opened his eyes    Vitals:   09/29/24 0500 09/29/24 0741 09/29/24 1133 09/29/24 1510  BP:  ROLLEN)  140/68 134/69 136/64  Pulse:  63 67 69  Resp:    17  Temp:  98 F (36.7 C) 98.9 F (37.2 C) 97.8 F (36.6 C)  TempSrc:  Axillary Oral   SpO2:  97% 94% 100%  Weight: 82.6 kg     Height:        Intake/Output Summary (Last 24 hours) at 09/29/2024 1540 Last data filed at 09/29/2024 1200 Gross per 24 hour  Intake 3611.44 ml  Output 2600 ml  Net 1011.44 ml   Filed Weights   09/27/24 0500 09/28/24 0500 09/29/24 0500  Weight: 81.8 kg 82.7 kg 82.6 kg    Data Reviewed: I have  personally reviewed and interpreted daily labs, tele strips, imagings as discussed above. I reviewed all nursing notes, pharmacy notes, vitals, pertinent old records I have discussed plan of care as described above with RN and patient/family.  CBC: Recent Labs  Lab 09/22/24 1758 09/23/24 0628 09/25/24 0310 09/26/24 0320 09/27/24 0423 09/28/24 0421 09/29/24 0431  WBC 11.4*   < > 8.6 5.9 5.6 6.5 8.1  NEUTROABS 9.1*  --   --   --   --   --   --   HGB 11.1*   < > 9.9* 9.8* 9.7* 9.7* 9.8*  HCT 34.9*   < > 31.0* 30.0* 29.7* 30.0* 30.5*  MCV 94.8   < > 93.4 92.6 91.4 91.7 92.7  PLT 194   < > 129* 122* 120* 125* 142*   < > = values in this interval not displayed.   Basic Metabolic Panel: Recent Labs  Lab 09/24/24 0955 09/25/24 0310 09/25/24 1411 09/26/24 0320 09/27/24 0423 09/27/24 1954 09/28/24 0421 09/28/24 1558 09/29/24 0431  NA  --  142   < > 145 146* 149* 152* 150* 148*  K  --  4.2   < > 3.9 3.1* 3.1* 3.2* 3.9 4.0  CL  --  100   < > 102 105 107 109 110 110  CO2  --  17*   < > 20* 27 27 28 27 27   GLUCOSE  --  62*   < > 108* 188* 217* 173* 160* 169*  BUN  --  111*   < > 102* 84* 79* 74* 70* 63*  CREATININE  --  5.70*   < > 5.04* 4.16* 3.63* 3.50* 3.22* 3.16*  CALCIUM   --  7.8*   < > 8.0* 8.1* 7.7* 7.9* 7.8* 8.3*  MG 2.6* 2.3  --  2.3 2.2  --  2.0  --   --   PHOS 8.3* 5.9*  --  4.6 3.5  --  2.7  --  1.3*   < > = values in this interval not displayed.    Studies: No results found.   Scheduled Meds:  ARIPiprazole   2 mg Per Tube QHS   artificial tears  1 drop Both Eyes QHS   vitamin C   500 mg Per Tube BID   aspirin   81 mg Per Tube Daily   clopidogrel   75 mg Per Tube Daily   cyanocobalamin   1,000 mcg Intramuscular Daily   Followed by   NOREEN ON 10/02/2024] vitamin B-12  1,000 mcg Per Tube Daily   ergocalciferol  (VITAMIN D2)  50,000 Units Per Tube Weekly   feeding supplement (PROSource TF20)  60 mL Per Tube Daily   fiber supplement (BANATROL TF)  60 mL Per Tube BID    folic acid   1 mg Per Tube Daily   free water   300  mL Per Tube Q4H   guaiFENesin   10 mL Per Tube Q4H   heparin  injection (subcutaneous)  5,000 Units Subcutaneous Q8H   insulin  aspart  0-9 Units Subcutaneous Q4H   levETIRAcetam   500 mg Per Tube Q24H   Or   levETIRAcetam   500 mg Intravenous Q24H   multivitamin with minerals  1 tablet Per Tube Daily   mupirocin  ointment  1 Application Nasal BID   omeprazole   20 mg Per Tube BID   mouth rinse  15 mL Mouth Rinse 4 times per day   phosphorus  500 mg Per NG tube QID   senna-docusate  2 tablet Per Tube QHS   sertraline   175 mg Per Tube Daily   thiamine   100 mg Per Tube Daily   zinc  sulfate (50mg  elemental zinc )  220 mg Per Tube Daily   Continuous Infusions:  dextrose  5 % and 0.45 % NaCl with KCl 40 mEq/L Stopped (09/29/24 1355)   ertapenem  500 mg (09/29/24 1357)   feeding supplement (OSMOLITE 1.5 CAL) 1,000 mL (09/28/24 2332)   PRN Meds: acetaminophen  **OR** acetaminophen , camphor-menthol , ipratropium-albuterol , lactulose , loperamide  HCl, ondansetron  **OR** ondansetron  (ZOFRAN ) IV, mouth rinse  Time spent: 55 minutes  Author: ELVAN SOR. MD [facility] 09/29/2024 3:40 PM  To reach On-call, see care teams to locate the attending and reach out to them via [email or web address]. If 7PM-7AM, please contact night-coverage If you still have difficulty reaching the attending provider, please page the [facility] (Director on Call) for [facility] on amion for assistance.

## 2024-09-30 LAB — BASIC METABOLIC PANEL WITH GFR
Anion gap: 11 (ref 5–15)
BUN: 60 mg/dL — ABNORMAL HIGH (ref 8–23)
CO2: 26 mmol/L (ref 22–32)
Calcium: 7.8 mg/dL — ABNORMAL LOW (ref 8.9–10.3)
Chloride: 109 mmol/L (ref 98–111)
Creatinine, Ser: 3.26 mg/dL — ABNORMAL HIGH (ref 0.61–1.24)
GFR, Estimated: 19 mL/min — ABNORMAL LOW
Glucose, Bld: 140 mg/dL — ABNORMAL HIGH (ref 70–99)
Potassium: 4.4 mmol/L (ref 3.5–5.1)
Sodium: 146 mmol/L — ABNORMAL HIGH (ref 135–145)

## 2024-09-30 LAB — MAGNESIUM: Magnesium: 1.8 mg/dL (ref 1.7–2.4)

## 2024-09-30 LAB — GLUCOSE, CAPILLARY
Glucose-Capillary: 108 mg/dL — ABNORMAL HIGH (ref 70–99)
Glucose-Capillary: 118 mg/dL — ABNORMAL HIGH (ref 70–99)
Glucose-Capillary: 129 mg/dL — ABNORMAL HIGH (ref 70–99)
Glucose-Capillary: 139 mg/dL — ABNORMAL HIGH (ref 70–99)
Glucose-Capillary: 142 mg/dL — ABNORMAL HIGH (ref 70–99)
Glucose-Capillary: 143 mg/dL — ABNORMAL HIGH (ref 70–99)

## 2024-09-30 LAB — PHOSPHORUS: Phosphorus: 2.5 mg/dL (ref 2.5–4.6)

## 2024-09-30 MED ORDER — SODIUM CHLORIDE 0.9 % IV SOLN
500.0000 mg | INTRAVENOUS | Status: AC
  Administered 2024-09-30: 500 mg via INTRAVENOUS
  Filled 2024-09-30: qty 500

## 2024-09-30 NOTE — Plan of Care (Signed)

## 2024-09-30 NOTE — Evaluation (Signed)
 Clinical/Bedside Swallow Evaluation Patient Details  Name: Rodney Estrada MRN: [id number] Date of Birth: 03-15-1951  Today's Date: 09/30/2024 Time: SLP Start Time (ACUTE ONLY): 1000 SLP Stop Time (ACUTE ONLY): 1100 SLP Time Calculation (min) (ACUTE ONLY): 60 min  Past Medical History:  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   a.) s/p mechanical thrombectomy + stenting (2.5 x 15 mm Resolute Onyx) + TPA   Aortic atherosclerosis    Atrial fibrillation (HCC)    a.) CHA2DS2VASc = 7 (age, CHF, HTN, CVA x2, vascular disease history, T2DM);  b.) rate/rhythm maintained on oral labetalol ; chronically anticoagulated with ASA + clopidogrel    Basal cell carcinoma    Bilateral carotid artery stenosis    a.) CTA neck 04/05/2020: 40-50% LICA   CAD (coronary artery disease)    Chronic gingivitis    Chronic systolic (congestive) heart failure (HCC)    a.) TTE 04/06/2020: EF 50-55%, mod LAE, mod AoV sclerosis/calc, triv TR/PR, mild MR, G2DD; b.) TTE 08/29/2021: EF >55%, mod LAE, triv AR/TR/PR. mild MR, G1DD.   Cocaine use    a.) last UDS (+) on 04/05/2020   Constipation    DOE (dyspnea on exertion)    Functional dyspepsia    Gout    Hemorrhoids    History of aspiration pneumonitis    History of ETOH abuse    HLD (hyperlipidemia)    Hypertension    Ileus (HCC)    Infection due to ESBL-producing Escherichia coli 08/02/2022   Left hemiplegia (HCC) 04/05/2020   a.) s/p RIGHT MCA CVA   Long term current use of antithrombotics/antiplatelets    a.) on DAPT (ASA + clopidogrel )   Major depressive disorder    Muscle weakness    Nephrolithiasis    PVD (peripheral vascular disease)    SBO (small bowel obstruction) (HCC) 07/23/2022   Seborrhea capitis    T2DM (type 2 diabetes mellitus) (HCC)    Vitamin D  deficiency    Past Surgical History:  Past Surgical History:  Procedure Laterality Date   CYSTOSCOPY W/ URETERAL STENT REMOVAL Left 11/14/2022   Procedure:  CYSTOSCOPY WITH STENT REMOVAL;  Surgeon: Twylla Glendia BROCKS, MD;  Location: [facility];  Service: Urology;  Laterality: Left;   CYSTOSCOPY/URETEROSCOPY/HOLMIUM LASER/STENT PLACEMENT Left 08/02/2022   Procedure: CYSTOSCOPY/URETEROSCOPY/HOLMIUM LASER/STENT PLACEMENT;  Surgeon: Twylla Glendia BROCKS, MD;  Location: [facility];  Service: Urology;  Laterality: Left;   CYSTOSCOPY/URETEROSCOPY/HOLMIUM LASER/STENT PLACEMENT Left 08/29/2022   Procedure: CYSTOSCOPY/URETEROSCOPY/HOLMIUM LASER/STENT EXCHANGE;  Surgeon: Twylla Glendia BROCKS, MD;  Location: [facility];  Service: Urology;  Laterality: Left;   IR CT HEAD LTD  04/05/2020   IR FLUORO GUIDE CV LINE RIGHT  08/01/2022   IR INTRA CRAN STENT  04/05/2020   IR PERCUTANEOUS ART THROMBECTOMY/INFUSION INTRACRANIAL INC DIAG ANGIO  04/05/2020       IR PERCUTANEOUS ART THROMBECTOMY/INFUSION INTRACRANIAL INC DIAG ANGIO  04/05/2020   IR US  GUIDE VASC ACCESS RIGHT  08/01/2022   RADIOLOGY WITH ANESTHESIA N/A 04/05/2020   Procedure: IR WITH ANESTHESIA;  Surgeon: Radiologist, Medication, MD;  Location: [facility];  Service: Radiology;  Laterality: N/A;   HPI:  Per chart notes, pt is a 74 y.o. male with medical history significant for polysubstance abuse, ETOH, coronary artery disease, chronic systolic CHF, gout, PVD, hypertension, dyslipidemia, atrial fibrillation and R MCA CVA w/ L hemiplegia and dysphagia in 2021, MDD, as well as type 2 diabetes mellitus who presented from his SNF to the emergency room with acute onset of  altered mental status with confusion and decreased responsiveness since Friday at his SNF.  He has been nonverbal with increased tremors.  No reported fever or chills.  SNF staff noted congested cough and therefore the patient was given IM Rocephin  this afternoon.  The patient was nonverbal during my interview and would answer some questions nodding his head.    CXR at admit: No acute cardiopulmonary abnormality.  2. Low lung volumes.   MRI this admit: No acute intracranial  abnormality.  2. Chronic right MCA territory encephalomalacia with ex vacuo dilatation of the  right lateral ventricle, extensive right hemispheric gliosis, and Wallerian  degeneration of the right cerebral peduncle.    Assessment / Plan / Recommendation  Clinical Impression   Pt seen for BSE today. Pt awakened w/ Min-Mod verbal/tactile stim; still seemed drowsy but responded to the engagement and the po's given adequately. Nodded head and said uh-huh intermittently but not much other verbal communication. Unsure of Baseline Cognitive functioning. NSG reported pt has been lethargic/drowsy and sleeping most of the time for the past several days. NGT present w/ TFs ongoing.  Noted Palliative Care is following.  On RA, afebrile. WBC WNL.  Pt appears to present w/ grossly functional oropharyngeal phase swallowing w/ limited po trials/consistencies w/ no overt, significant s/s of oropharyngeal phase dysphagia noted; functional neuromuscular abilities noted. However, pt exhibited decreased Cognitive Awareness during po tasks intermittently.  Pt consumed the few po trials w/ No immediate, overt clinical s/s of aspiration during the trials when following aspiration precautions and being fed by SLP. However, he has challenging factors that could impact oropharyngeal swallowing to include impact of suspected decline in Cognitive functioning/engagement; reduced Awareness overall; dependent feeder; deconditioning/weakness; old CVA w/ dysphagia in 2021. These factors can increase risk for aspiration, dysphagia as well as decreased oral intake overall.   Pt was assessed w/ NGT present. During po trials, pt required Min-Mod verbal/tactile/visual cues for initiation and follow through during oral prep and oral phase. Once he engaged in bolus manipulation/mastication w/ the purees(frozen ice cream) and the thin liquids, he consumed the consistencies w/ no overt coughing, decline in vocal quality, or change in  respiratory presentation during/post trials. O2 sats remained in upper 90s during. Oral prep and oral phase appeared impacted by the Cognitive decline and decreased Awareness to tasks/environment. W/ cues and support/stim, he accepted boluses orally then engaged in bolus manipulation. Once engaged, he demonstrated grossly functional bolus management, mastication of solid ice cream, and control of bolus propulsion for A-P transfer for swallowing. Oral clearing achieved w/ all trial consistencies. Initially, intermittent oral holding/delay noted during oral phase. OM Exam was cursory but no unilateral weakness noted during bolus management and oral clearing. Pt required FULL feeding assistance.   Recommend continue NPO status w/ ongoing therapeutic po trials and assessment of swallowing w/ ST services next 1-2 days, while monitoring his State and Alertness. Pt must demonstrate adequate Alertness and Awareness for safe oral intake. Recommend frequent oral care for hygiene and stimulation of swallowing w/ NSG staff. Recommend general aspiration precautions. Meds via NGT.  Education given on the above to NSG, MD via secure chat. ST services will f/u tomorrow. NSG updated, agreed. MD updated. Recommend Dietician f/u for support. SLP Visit Diagnosis: Dysphagia, oropharyngeal phase (R13.12) (impact of declined Cognitive functioning/engagement; reduced Awareness overall; dependent feeder; old CVA w/ dysphagia in 2021)    Aspiration Risk  Moderate aspiration risk;Risk for inadequate nutrition/hydration    Diet Recommendation   NPO (ongoing therapeutic  trials w/ ST services currently in order to establish appropriate po diet consistency) = frequent oral care for hygiene and stimulation of swallowing w/ NSG; aspiration precautions  Medication Administration: Via alternative means    Other Recommendations Recommended Consults:  (Dietician ongoing) Oral Care Recommendations: Oral care QID;Staff/trained caregiver to  provide oral care Caregiver Recommendations:  (TBD)     Swallow Evaluation Recommendations Caregiver Recommendations:  (TBD)   Assistance Recommended at Discharge  FULL   Functional Status Assessment Patient has had a recent decline in their functional status and demonstrates the ability to make significant improvements in function in a reasonable and predictable amount of time.  Frequency and Duration min 2x/week  2 weeks       Prognosis Prognosis for improved oropharyngeal function: Fair Barriers to Reach Goals: Cognitive deficits;Language deficits;Time post onset;Severity of deficits Barriers/Prognosis Comment: impact of declined Cognitive functioning/engagement; reduced Awareness overall; dependent feeder; old CVA w/ dysphagia in 2021; drowsy/lethargic      Swallow Study   General Date of Onset: 09/22/24 HPI: Per chart notes, pt is a 74 y.o. 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  He has been nonverbal with increased tremors.  No reported fever or chills.  SNF staff noted congested cough and therefore the patient was given IM Rocephin  this afternoon.  The patient was nonverbal during my interview and would answer some questions nodding his head.    CXR at admit: No acute cardiopulmonary abnormality.  2. Low lung volumes.   MRI this admit: No acute intracranial abnormality.  2. Chronic right MCA territory encephalomalacia with ex vacuo dilatation of the  right lateral ventricle, extensive right hemispheric gliosis, and Wallerian  degeneration of the right cerebral peduncle. Type of Study: Bedside Swallow  Evaluation Previous Swallow Assessment: 2021- dysphagia diet then Diet Prior to this Study: NPO;Large bore NG tube (TFs) Temperature Spikes Noted: No (wbc 8.1) Respiratory Status: Room air History of Recent Intubation: No Behavior/Cognition: Cooperative;Alert;Pleasant mood;Confused;Lethargic/Drowsy;Distractible;Requires cueing Oral Cavity Assessment: Dry (sticky) Oral Care Completed by SLP: Yes Oral Cavity - Dentition: Adequate natural dentition;Missing dentition (few) Vision:  (n/a) Self-Feeding Abilities: Total assist Patient Positioning: Upright in bed (full assist) Baseline Vocal Quality: Low vocal intensity (uh-huh) Volitional Cough: Cognitively unable to elicit Volitional Swallow: Unable to elicit    Oral/Motor/Sensory Function Overall Oral Motor/Sensory Function: Within functional limits (no unilateral weakness noted w/ bolus management)   Ice Chips Ice chips: Impaired Presentation: Spoon (fed; 2 trials) Oral Phase Impairments: Poor awareness of bolus Pharyngeal Phase Impairments:  (n/a - pt expectorated)   Thin Liquid Thin Liquid: Impaired Presentation: Spoon;Straw (fed; 5 trials via each method) Oral Phase Impairments: Poor awareness of bolus Oral Phase Functional Implications: Oral holding (x2) Pharyngeal  Phase Impairments:  (no overt s/s)    Nectar Thick Nectar Thick Liquid: Not tested   Honey Thick Honey Thick Liquid: Not tested   Puree Puree: Within functional limits (grossly) Presentation: Spoon (fed; 9 trials) Other Comments: cues   Solid     Solid: Not tested        Comer Portugal, MS, CCC-SLP Speech Language Pathologist Rehab Services; [facility] [address] - [place] [phone] (ascom) Yvanna Vidas 09/30/2024,3:22 PM

## 2024-09-30 NOTE — Progress Notes (Addendum)
 "                                                                                                                                                                                                          Daily Progress Note   Patient Name: Rodney Estrada       Date: 09/30/2024 DOB: 1951/01/30  Age: 74 y.o. MRN#: [id number] Attending Physician: Von Bellis, MD Primary Care Physician: Pcp, No Admit Date: 09/22/2024  Reason for Consultation/Follow-up: Establishing goals of care  Subjective: Notes and labs reviewed. He is resting in bed with eyes closed and mittens in place. No family at bedside. Family has desired time for outcomes. Will need to reach out to family.  Attempted to call family and call was immediately forwarded to voicemail without ringing.  Will reattempt tomorrow.  Length of Stay: 8  Current Medications: Scheduled Meds:   ARIPiprazole   2 mg Per Tube QHS   artificial tears  1 drop Both Eyes QHS   vitamin C   500 mg Per Tube BID   aspirin   81 mg Per Tube Daily   clopidogrel   75 mg Per Tube Daily   cyanocobalamin   1,000 mcg Intramuscular Daily   Followed by   NOREEN ON 10/02/2024] vitamin B-12  1,000 mcg Per Tube Daily   ergocalciferol  (VITAMIN D2)  50,000 Units Per Tube Weekly   feeding supplement (PROSource TF20)  60 mL Per Tube Daily   fiber supplement (BANATROL TF)  60 mL Per Tube BID   folic acid   1 mg Per Tube Daily   free water   300 mL Per Tube Q4H   guaiFENesin   10 mL Per Tube Q4H   heparin  injection (subcutaneous)  5,000 Units Subcutaneous Q8H   insulin  aspart  0-9 Units Subcutaneous Q4H   levETIRAcetam   500 mg Per Tube Q24H   Or   levETIRAcetam   500 mg Intravenous Q24H   multivitamin with minerals  1 tablet Per Tube Daily   omeprazole   20 mg Per Tube BID   mouth rinse  15 mL Mouth Rinse 4 times per day   phosphorus  500 mg Per NG tube QID   senna-docusate  2 tablet Per Tube QHS   sertraline   175 mg Per Tube Daily   thiamine   100 mg Per Tube Daily   zinc   sulfate (50mg  elemental zinc )  220 mg Per Tube Daily    Continuous Infusions:  feeding supplement (OSMOLITE 1.5 CAL) 55 mL/hr at 09/30/24 0400    PRN Meds: acetaminophen  **OR** acetaminophen , camphor-menthol , ipratropium-albuterol , lactulose , loperamide  HCl, ondansetron  **  OR** ondansetron  (ZOFRAN ) IV, mouth rinse  Physical Exam Pulmonary:     Effort: Pulmonary effort is normal.  Skin:    General: Skin is warm and dry.  Neurological:     Mental Status: He is alert.             Vital Signs: BP 138/71 (BP Location: Right Arm)   Pulse 69   Temp 97.6 F (36.4 C) (Axillary)   Resp 20   Ht 5' 9 (1.753 m)   Wt 94 kg Comment: RN aware  SpO2 93%   BMI 30.60 kg/m  SpO2: SpO2: 93 % O2 Device: O2 Device: Room Air O2 Flow Rate: O2 Flow Rate (L/min): 1 L/min  Intake/output summary:  Intake/Output Summary (Last 24 hours) at 09/30/2024 1452 Last data filed at 09/30/2024 1213 Gross per 24 hour  Intake 3340.56 ml  Output 1900 ml  Net 1440.56 ml   LBM: Last BM Date : 09/29/24 Baseline Weight: Weight: 81.1 kg Most recent weight: Weight: 94 kg (RN aware)   Patient Active Problem List   Diagnosis Date Noted   Urinary tract infection without hematuria 09/29/2024   Malnutrition of moderate degree 09/26/2024   Pyelonephritis 09/26/2024   Uremic encephalopathy 09/26/2024   Elevated troponin 09/23/2024   Acute metabolic encephalopathy 09/22/2024   Acute lower UTI 09/22/2024   Acute kidney injury superimposed on chronic kidney disease 09/22/2024   Type 2 diabetes mellitus with peripheral neuropathy (HCC) 09/22/2024   Seizure disorder (HCC) 09/22/2024   GERD without esophagitis 09/22/2024   Depression 09/22/2024   Gout 09/22/2024   Dyslipidemia 09/22/2024   Hyperkalemia 09/22/2024   Coronary artery disease 09/22/2024   Hypernatremia 07/31/2022   Hiccups 07/28/2022   Hypokalemia 07/27/2022   SBO (small bowel obstruction) (HCC) 07/23/2022   Nephrolithiasis 07/23/2022   Partial  intestinal obstruction (HCC)    Cerebral edema (HCC) 04/16/2020   Hypertensive emergency 04/16/2020   Essential hypertension 04/16/2020   Hyperlipidemia LDL goal <70 04/16/2020   Type 2 diabetes mellitus (HCC) 04/16/2020   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  Attempted unsuccessfully to reach patient's sister.  Will reattempt tomorrow.  Code Status:    Code Status Orders  (From admission, onward)           Start     Ordered   09/23/24 0543  Do not attempt resuscitation (DNR)- Limited -Do Not Intubate (DNI)  (Code Status)  Continuous       Question Answer Comment  If pulseless and not breathing No CPR or chest compressions.   In Pre-Arrest Conditions (Patient Is Breathing and Has A Pulse) Do not intubate. Provide all appropriate non-invasive medical interventions. Avoid ICU transfer unless indicated or required.   Consent: Discussion documented in EHR or advanced directives reviewed      09/23/24 0542           Code Status History     Date Active Date Inactive Code Status Order ID Comments User Context   09/22/2024 2108 09/23/2024 0542 Full Code [id number]  Lawence Madison LABOR, MD ED   07/23/2022 1425 08/08/2022 0354 Full Code  [id number]  Arnett Saunders, MD ED   04/05/2020 1121 04/19/2020 2338 Full Code [id number]  Claudene Alm SAUNDERS, PA-C ED       Prognosis:  poor  Camelia Lewis, NP  Please contact Palliative Medicine Team phone at [phone] for questions and concerns.       "

## 2024-09-30 NOTE — Progress Notes (Signed)
 [facility], [place] 09/30/2024  Subjective:   Hospital day # 8  Patient seen laying in bed Minimal response to questions  Creatinine 3.26 Urine output Tube feeds 69ml/hr  Objective:  Vital signs in last 24 hours:  Temp:  [97.8 F (36.6 C)-99.3 F (37.4 C)] 98.3 F (36.8 C) (01/06 0800) Pulse Rate:  [67-73] 73 (01/06 0800) Resp:  [17-20] 20 (01/06 0327) BP: (128-147)/(62-89) 128/89 (01/06 0800) SpO2:  [91 %-100 %] 96 % (01/06 0800) Weight:  [94 kg] 94 kg (01/06 0629)  Weight change: 11.4 kg Filed Weights   09/28/24 0500 09/29/24 0500 09/30/24 0629  Weight: 82.7 kg 82.6 kg 94 kg    Intake/Output:    Intake/Output Summary (Last 24 hours) at 09/30/2024 1125 Last data filed at 09/30/2024 9167 Gross per 24 hour  Intake 3340.56 ml  Output 2800 ml  Net 540.56 ml     Physical Exam: General: Chronically ill-appearing  HEENT [place] O2, NGT  Pulm/lungs Clear at bases  CVS/Heart Regular rate and rhythm  Abdomen:  Soft, nontender, nondistended  Extremities: Trace dependent edema  Neurologic: Arouseable   Skin: Warm, dry   External urinary catheter in place       Basic Metabolic Panel:  Recent Labs  Lab 09/25/24 0310 09/25/24 1411 09/26/24 0320 09/27/24 0423 09/27/24 1954 09/28/24 0421 09/28/24 1558 09/29/24 0431 09/30/24 0352  NA 142   < > 145 146* 149* 152* 150* 148* 146*  K 4.2   < > 3.9 3.1* 3.1* 3.2* 3.9 4.0 4.4  CL 100   < > 102 105 107 109 110 110 109  CO2 17*   < > 20* 27 27 28 27 27 26   GLUCOSE 62*   < > 108* 188* 217* 173* 160* 169* 140*  BUN 111*   < > 102* 84* 79* 74* 70* 63* 60*  CREATININE 5.70*   < > 5.04* 4.16* 3.63* 3.50* 3.22* 3.16* 3.26*  CALCIUM  7.8*   < > 8.0* 8.1* 7.7* 7.9* 7.8* 8.3* 7.8*  MG 2.3  --  2.3 2.2  --  2.0  --   --  1.8  PHOS 5.9*  --  4.6 3.5  --  2.7  --  1.3* 2.5   < > = values in this interval not displayed.     CBC: Recent Labs  Lab 09/25/24 0310 09/26/24 0320 09/27/24 0423  09/28/24 0421 09/29/24 0431  WBC 8.6 5.9 5.6 6.5 8.1  HGB 9.9* 9.8* 9.7* 9.7* 9.8*  HCT 31.0* 30.0* 29.7* 30.0* 30.5*  MCV 93.4 92.6 91.4 91.7 92.7  PLT 129* 122* 120* 125* 142*     No results found for: HEPBSAG, HEPBSAB, HEPBIGM    Microbiology:  Recent Results (from the past 240 hours)  Culture, blood (routine x 2)     Status: Abnormal   Collection Time: 09/22/24  5:37 PM   Specimen: BLOOD LEFT HAND  Result Value Ref Range Status   Specimen Description   Final    BLOOD LEFT HAND Performed at [facility], [address] [place], [place] [address]    Special Requests   Final    BOTTLES DRAWN AEROBIC AND ANAEROBIC Blood Culture results may not be optimal due to an inadequate volume of blood received in culture bottles Performed at [facility], [address], [place], [place] [address]    Culture  Setup Time   Final    GRAM POSITIVE COCCI AEROBIC BOTTLE ONLY CRITICAL VALUE NOTED.  VALUE IS CONSISTENT WITH PREVIOUSLY REPORTED AND CALLED VALUE. Performed at [facility], [address] [place], [place] [address]    Culture STAPHYLOCOCCUS EPIDERMIDIS (A)  Final   Report Status 09/25/2024 FINAL  Final   Organism ID, Bacteria STAPHYLOCOCCUS EPIDERMIDIS  Final      Susceptibility   Staphylococcus epidermidis - MIC*    CIPROFLOXACIN 4 RESISTANT Resistant     ERYTHROMYCIN >=8 RESISTANT Resistant     GENTAMICIN  >=16 RESISTANT Resistant     OXACILLIN >=4 RESISTANT Resistant     TETRACYCLINE <=1 SENSITIVE Sensitive     VANCOMYCIN  2 SENSITIVE Sensitive     TRIMETH/SULFA 160 RESISTANT Resistant     CLINDAMYCIN >=8 RESISTANT Resistant     RIFAMPIN <=0.5 SENSITIVE Sensitive     Inducible Clindamycin NEGATIVE Sensitive     * STAPHYLOCOCCUS EPIDERMIDIS  Culture, blood (routine x 2)     Status: Abnormal   Collection Time: 09/22/24  5:57 PM   Specimen: Right Antecubital; Blood  Result Value Ref Range Status   Specimen Description   Final     RIGHT ANTECUBITAL Performed at [name], [address] [place], [place] [address]    Special Requests   Final    BOTTLES DRAWN AEROBIC AND ANAEROBIC Blood Culture results may not be optimal due to an inadequate volume of blood received in culture bottles Performed at [facility], [address] [place], [place] [address]    Culture  Setup Time   Final    GRAM POSITIVE COCCI ANAEROBIC BOTTLE ONLY CRITICAL RESULT CALLED TO, READ BACK BY AND VERIFIED WITH: EMILY STEINBOCK 09/23/24 1424 KLW    Culture STAPHYLOCOCCUS EPIDERMIDIS (A)  Final   Report Status 09/25/2024 FINAL  Final   Organism ID, Bacteria STAPHYLOCOCCUS EPIDERMIDIS  Final      Susceptibility   Staphylococcus epidermidis - MIC*    CIPROFLOXACIN <=0.5 SENSITIVE Sensitive     ERYTHROMYCIN >=8 RESISTANT Resistant     GENTAMICIN  >=16 RESISTANT Resistant     OXACILLIN >=4 RESISTANT Resistant     TETRACYCLINE <=1 SENSITIVE Sensitive     VANCOMYCIN  1 SENSITIVE Sensitive     TRIMETH/SULFA 160 RESISTANT Resistant     CLINDAMYCIN <=0.25 SENSITIVE Sensitive     RIFAMPIN <=0.5 SENSITIVE Sensitive     Inducible Clindamycin NEGATIVE Sensitive     * STAPHYLOCOCCUS EPIDERMIDIS  Blood Culture ID Panel (Reflexed)     Status: Abnormal   Collection Time: 09/22/24  5:57 PM  Result Value Ref Range Status   Enterococcus faecalis NOT DETECTED NOT DETECTED Final   Enterococcus Faecium NOT DETECTED NOT DETECTED Final   Listeria monocytogenes NOT DETECTED NOT DETECTED Final   Staphylococcus species DETECTED (A) NOT DETECTED Final    Comment: CRITICAL RESULT CALLED TO, READ BACK BY AND VERIFIED WITH: EMILY STEINBOCK 09/23/24 1424 KLW    Staphylococcus aureus (BCID) NOT DETECTED NOT DETECTED Final   Staphylococcus epidermidis DETECTED (A) NOT DETECTED Final    Comment: Methicillin (oxacillin) resistant coagulase negative staphylococcus. Possible blood culture contaminant (unless isolated from more than one blood  culture draw or clinical case suggests pathogenicity). No antibiotic treatment is indicated for blood  culture contaminants. CRITICAL RESULT CALLED TO, READ BACK BY AND VERIFIED WITH: EMILY STEINBOCK 09/23/24 1424 KLW    Staphylococcus lugdunensis NOT DETECTED NOT DETECTED Final   Streptococcus species NOT DETECTED NOT DETECTED Final   Streptococcus agalactiae NOT DETECTED NOT DETECTED Final   Streptococcus pneumoniae NOT DETECTED NOT DETECTED Final  Streptococcus pyogenes NOT DETECTED NOT DETECTED Final   A.calcoaceticus-baumannii NOT DETECTED NOT DETECTED Final   Bacteroides fragilis NOT DETECTED NOT DETECTED Final   Enterobacterales NOT DETECTED NOT DETECTED Final   Enterobacter cloacae complex NOT DETECTED NOT DETECTED Final   Escherichia coli NOT DETECTED NOT DETECTED Final   Klebsiella aerogenes NOT DETECTED NOT DETECTED Final   Klebsiella oxytoca NOT DETECTED NOT DETECTED Final   Klebsiella pneumoniae NOT DETECTED NOT DETECTED Final   Proteus species NOT DETECTED NOT DETECTED Final   Salmonella species NOT DETECTED NOT DETECTED Final   Serratia marcescens NOT DETECTED NOT DETECTED Final   Haemophilus influenzae NOT DETECTED NOT DETECTED Final   Neisseria meningitidis NOT DETECTED NOT DETECTED Final   Pseudomonas aeruginosa NOT DETECTED NOT DETECTED Final   Stenotrophomonas maltophilia NOT DETECTED NOT DETECTED Final   Candida albicans NOT DETECTED NOT DETECTED Final   Candida auris NOT DETECTED NOT DETECTED Final   Candida glabrata NOT DETECTED NOT DETECTED Final   Candida krusei NOT DETECTED NOT DETECTED Final   Candida parapsilosis NOT DETECTED NOT DETECTED Final   Candida tropicalis NOT DETECTED NOT DETECTED Final   Cryptococcus neoformans/gattii NOT DETECTED NOT DETECTED Final   Methicillin resistance mecA/C DETECTED (A) NOT DETECTED Final    Comment: CRITICAL RESULT CALLED TO, READ BACK BY AND VERIFIED WITH: EMILY STEINBOCK 09/23/24 1424 KLW Performed at [facility], [address] [place], [place] [address]   Resp panel by RT-PCR (RSV, Flu A&B, Covid) Anterior Nasal Swab     Status: None   Collection Time: 09/22/24  5:58 PM   Specimen: Anterior Nasal Swab  Result Value Ref Range Status   SARS Coronavirus 2 by RT PCR NEGATIVE NEGATIVE Final    Comment: (NOTE) SARS-CoV-2 target nucleic acids are NOT DETECTED.  The SARS-CoV-2 RNA is generally detectable in upper respiratory specimens during the acute phase of infection. The lowest concentration of SARS-CoV-2 viral copies this assay can detect is 138 copies/mL. A negative result does not preclude SARS-Cov-2 infection and should not be used as the sole basis for treatment or other patient management decisions. A negative result may occur with  improper specimen collection/handling, submission of specimen other than nasopharyngeal swab, presence of viral mutation(s) within the areas targeted by this assay, and inadequate number of viral copies(<138 copies/mL). A negative result must be combined with clinical observations, patient history, and epidemiological information. The expected result is Negative.  Fact Sheet for Patients:  bloggercourse.com  Fact Sheet for Healthcare Providers:  seriousbroker.it  This test is no t yet approved or cleared by the [place]  FDA and  has been authorized for detection and/or diagnosis of SARS-CoV-2 by FDA under an Emergency Use Authorization (EUA). This EUA will remain  in effect (meaning this test can be used) for the duration of the COVID-19 declaration under Section 564(b)(1) of the Act, 21 U.S.C.section 360bbb-3(b)(1), unless the authorization is terminated  or revoked sooner.       Influenza A by PCR NEGATIVE NEGATIVE Final   Influenza B by PCR NEGATIVE NEGATIVE Final    Comment: (NOTE) The Xpert Xpress SARS-CoV-2/FLU/RSV plus assay is intended as an aid in the diagnosis of influenza  from Nasopharyngeal swab specimens and should not be used as a sole basis for treatment. Nasal washings and aspirates are unacceptable for Xpert Xpress SARS-CoV-2/FLU/RSV testing.  Fact Sheet for Patients: bloggercourse.com  Fact Sheet for Healthcare Providers: seriousbroker.it  This test is not yet approved or cleared by the [place]   FDA and has been authorized for detection and/or diagnosis of SARS-CoV-2 by FDA under an Emergency Use Authorization (EUA). This EUA will remain in effect (meaning this test can be used) for the duration of the COVID-19 declaration under Section 564(b)(1) of the Act, 21 U.S.C. section 360bbb-3(b)(1), unless the authorization is terminated or revoked.     Resp Syncytial Virus by PCR NEGATIVE NEGATIVE Final    Comment: (NOTE) Fact Sheet for Patients: bloggercourse.com  Fact Sheet for Healthcare Providers: seriousbroker.it  This test is not yet approved or cleared by the [place]  FDA and has been authorized for detection and/or diagnosis of SARS-CoV-2 by FDA under an Emergency Use Authorization (EUA). This EUA will remain in effect (meaning this test can be used) for the duration of the COVID-19 declaration under Section 564(b)(1) of the Act, 21 U.S.C. section 360bbb-3(b)(1), unless the authorization is terminated or revoked.  Performed at [facility], [address] [place], [place] [address]   Culture, blood (Routine X 2) w Reflex to ID Panel     Status: None   Collection Time: 09/24/24  6:18 PM   Specimen: BLOOD  Result Value Ref Range Status   Specimen Description BLOOD BLOOD RIGHT HAND  Final   Special Requests   Final    BOTTLES DRAWN AEROBIC AND ANAEROBIC Blood Culture adequate volume   Culture   Final    NO GROWTH 5 DAYS Performed at [facility], [address] [place], [place] [address]    Report  Status 09/29/2024 FINAL  Final  MRSA Next Gen by PCR, Nasal     Status: Abnormal   Collection Time: 09/24/24  6:18 PM   Specimen: Nasal Mucosa; Nasal Swab  Result Value Ref Range Status   MRSA by PCR Next Gen DETECTED (A) NOT DETECTED Final    Comment: RESULT CALLED TO, READ BACK BY AND VERIFIED WITH:  GEORGINA OROZCO AT 2345 09/24/24 JG (NOTE) The GeneXpert MRSA Assay (FDA approved for NASAL specimens only), is one component of a comprehensive MRSA colonization surveillance program. It is not intended to diagnose MRSA infection nor to guide or monitor treatment for MRSA infections. Test performance is not FDA approved in patients less than 72 years old. Performed at [facility], [address] [place], [place] [address]   Culture, blood (Routine X 2) w Reflex to ID Panel     Status: None   Collection Time: 09/24/24  9:16 PM   Specimen: BLOOD  Result Value Ref Range Status   Specimen Description BLOOD BLOOD RIGHT HAND  Final   Special Requests   Final    BOTTLES DRAWN AEROBIC AND ANAEROBIC Blood Culture results may not be optimal due to an inadequate volume of blood received in culture bottles   Culture   Final    NO GROWTH 5 DAYS Performed at [facility], [address], [place], [place] [address]    Report Status 09/29/2024 FINAL  Final  Urine Culture (for pregnant, neutropenic or urologic patients or patients with an indwelling urinary catheter)     Status: None   Collection Time: 09/25/24 10:03 AM   Specimen: Urine, Clean Catch  Result Value Ref Range Status   Specimen Description   Final    URINE, CLEAN CATCH Performed at [facility], [address], [place], [place] [address]    Special Requests   Final    NONE Performed at [facility], [address], [place], [place] [address]  Culture   Final    NO GROWTH Performed at [facility], [address], [place], [place] [address]    Report Status 09/26/2024 FINAL   Final  Gastrointestinal Panel by PCR , Stool     Status: None   Collection Time: 09/28/24  1:33 PM   Specimen: Stool  Result Value Ref Range Status   Campylobacter species NOT DETECTED NOT DETECTED Final   Plesimonas shigelloides NOT DETECTED NOT DETECTED Final   Salmonella species NOT DETECTED NOT DETECTED Final   Yersinia enterocolitica NOT DETECTED NOT DETECTED Final   Vibrio species NOT DETECTED NOT DETECTED Final   Vibrio cholerae NOT DETECTED NOT DETECTED Final   Enteroaggregative E coli (EAEC) NOT DETECTED NOT DETECTED Final   Enteropathogenic E coli (EPEC) NOT DETECTED NOT DETECTED Final   Enterotoxigenic E coli (ETEC) NOT DETECTED NOT DETECTED Final   Shiga like toxin producing E coli (STEC) NOT DETECTED NOT DETECTED Final   Shigella/Enteroinvasive E coli (EIEC) NOT DETECTED NOT DETECTED Final   Cryptosporidium NOT DETECTED NOT DETECTED Final   Cyclospora cayetanensis NOT DETECTED NOT DETECTED Final   Entamoeba histolytica NOT DETECTED NOT DETECTED Final   Giardia lamblia NOT DETECTED NOT DETECTED Final   Adenovirus F40/41 NOT DETECTED NOT DETECTED Final   Astrovirus NOT DETECTED NOT DETECTED Final   Norovirus GI/GII NOT DETECTED NOT DETECTED Final   Rotavirus A NOT DETECTED NOT DETECTED Final   Sapovirus (I, II, IV, and V) NOT DETECTED NOT DETECTED Final    Comment: Performed at [facility], [address] [place], [place] [address]    Coagulation Studies: No results for input(s): LABPROT, INR in the last 72 hours.  Urinalysis: No results for input(s): COLORURINE, LABSPEC, PHURINE, GLUCOSEU, HGBUR, BILIRUBINUR, KETONESUR, PROTEINUR, UROBILINOGEN, NITRITE, LEUKOCYTESUR in the last 72 hours.  Invalid input(s): APPERANCEUR     Imaging: DG Abd 1 View Result Date: 09/29/2024 CLINICAL DATA:  NG tube placement EXAM: ABDOMEN - 1 VIEW COMPARISON:  09/26/2024 FINDINGS: Enteric tube tip and side port overlie the expected location of mid  stomach. Pleural effusions and airspace disease at the bases. IMPRESSION: Enteric tube tip and side port overlie the expected location of the mid stomach. Electronically Signed   By: Luke Bun M.D.   On: 09/29/2024 22:29      Medications:    ertapenem      feeding supplement (OSMOLITE 1.5 CAL) 55 mL/hr at 09/30/24 0400    ARIPiprazole   2 mg Per Tube QHS   artificial tears  1 drop Both Eyes QHS   vitamin C   500 mg Per Tube BID   aspirin   81 mg Per Tube Daily   clopidogrel   75 mg Per Tube Daily   cyanocobalamin   1,000 mcg Intramuscular Daily   Followed by   NOREEN ON 10/02/2024] vitamin B-12  1,000 mcg Per Tube Daily   ergocalciferol  (VITAMIN D2)  50,000 Units Per Tube Weekly   feeding supplement (PROSource TF20)  60 mL Per Tube Daily   fiber supplement (BANATROL TF)  60 mL Per Tube BID   folic acid   1 mg Per Tube Daily   free water   300 mL Per Tube Q4H   guaiFENesin   10 mL Per Tube Q4H   heparin  injection (subcutaneous)  5,000 Units Subcutaneous Q8H   insulin  aspart  0-9 Units Subcutaneous Q4H   levETIRAcetam   500 mg Per Tube Q24H   Or   levETIRAcetam   500 mg Intravenous Q24H   multivitamin with minerals  1 tablet Per Tube Daily   omeprazole   20 mg Per Tube BID   mouth rinse  15 mL Mouth Rinse 4 times per day   phosphorus  500 mg Per NG tube QID   senna-docusate  2 tablet Per Tube QHS   sertraline   175 mg Per Tube Daily   thiamine   100 mg Per Tube Daily   zinc  sulfate (50mg  elemental zinc )  220 mg Per Tube Daily   acetaminophen  **OR** acetaminophen , camphor-menthol , ipratropium-albuterol , lactulose , loperamide  HCl, ondansetron  **OR** ondansetron  (ZOFRAN ) IV, mouth rinse  Assessment/ Plan:  74 y.o. male with with multiple medical problems including history of stroke, peripheral vascular disease, hypertension, type 2 diabetes admitted on 09/22/2024 for Acute metabolic encephalopathy [G93.41]   Acute kidney injury Chronic kidney disease, stage IIIa.  Creatinine 1.54/GFR 49  from 08/04/2022 Diabetes type 2 with chronic kidney disease History of chronic left hydronephrosis  history of kidney stones and left ureteral stent placement and removal in February 2024 Acute metabolic encephalopathy Acute metabolic acidosis   Patient has severe acute kidney injury.  He has history of kidney stones, left hydronephrosis and left ureteral stent placement.  Renal ultrasound from 09/24/2024 is negative for hydronephrosis.  Nonobstructing calculi in both kidneys Creatinine stable. Continue supportive measures. No acute indication for dialysis.  Sodium bicarb has corrected   Hemoglobin A1c 5.6% from 09/22/2024.   Glucose well controlled. Management as as per primary team.  Urinary tract infection-remains on ertapenem     LOS: 8 [facility] 1/6/202611:25 AM  [facility] [place], [place] [phone]

## 2024-09-30 NOTE — Progress Notes (Addendum)
 [facility] Progress Note  Patient: Rodney Estrada    FMW:[id number]  DOA: 09/22/2024     Date of Service: the patient was seen and examined on 09/30/2024  No chief complaint on file.  Brief hospital course:  from H&P - Euclid Cassetta is a 74 y.o. male with medical history significant for coronary artery disease, chronic systolic CHF, gout, hypertension, dyslipidemia, atrial fibrillation and CVA as well as type 2 diabetes mellitus who presented to the emergency room 12/29 with acute onset of altered mental status with confusion and decreased responsiveness since Friday 12/26 at his SNF.  He has been nonverbal with increased tremors.  No reported fever or chills.  SNF staff noted congested cough and therefore the patient was given IM Rocephin  this afternoon.  The patient was nonverbal during admitting hospitalist's interview and would answer some questions nodding his head.  No chest pain or palpitations were reported.  No reported nausea or vomiting or diarrhea or abdominal pain.     12/29: to ED. (+)UTI, started on IV fluids, vanc, cefepime . Admitted to hospitalist for acute encephalopathy d/t UTI, AKI on CKD 3a assoc w/ hyperkalemia. Concern w/ elevation troponins, heparin  gtt started.  12/30: holding heparin  gtt given hematuria. Cardiology following, in agreement. Echo pending. Pt remains confused. Echo pending.          Consultants:  Cardiology signed off on 12/31 Nephrology   Procedures/Surgeries: None   Assessment and Plan:  # Severe sepsis secondary to UTI Leukocytosis, tachycardia, tachypneic and hypotensive on 12/31 Patient responded well to IV fluid, did not require Levophed  Transferred to ICU, lactic acid within normal range and vitals improved after IV fluid so transferred to progressive unit.  cortisol level wnl S/p vancomycin , trough level was high, DC'd S/p ceftriaxone  till 1/2 12/31 NS 500 mL bolus and LR 500 bolus, 1/1 LR 500 mL bolus PCCM consulted, LR 500 bolus  ordered, if no improvement then patient will need Levophed . 1/2 started Invanz  as per ID 1/3 started daptomycin  as per ID, pharmacy consulted for dose 1/5 discontinued daptomycin  as per ID 1/6 discontinued ertapenem  as per ID #  # Acute metabolic encephalopathy secondary to uremia MRI brain pending radiology read - appears stable from previous on personal review  Continue neurochecks  keppra  levels elevated, pharmacy consulted for dose and monitor Hold gabapentin  (300 mg tid home dose) 1/4 awake and alert today, opened his eyes and able to tell me his name.  1/5 SLP consulted to eval for swallowing to start diet  # AKI on CKD stage III # Metabolic encephalopathy secondary to uremia # Metabolic acidosis History of chronic left hydronephrosis  history of kidney stones and left ureteral stent placement and removal in February 2024 Bicarbonate 100 mEq IV one-time push given Started bicarb IV infusion Nephrology consulted for urgent hemodialysis Palliative care consulted, as per nephro patient may not be able to get hemodialysis done as an outpatient due to comorbidities. 1/2 discussed with nephrology again today to start hemodialysis as patient's mental status is not improving due to uremia Nephrology will revisit tomorrow and consider hemodialysis tomorrow a.m. if needed 1/3 metabolic acidosis resolved, bicarb infusion discontinued.  Renal functions are improving, nephrology recommended no need of urgent hemodialysis and continue to monitor. Started free water  due to elevated sodium 1/5 sCr 3.16, BUN 63, gradually improved   # Acute lower UTI S/p IV Rocephin   1/1 repeated urine cultures negative 1/2 started ertapenem  1/3 started daptomycin    # Staph epidermidis blood culture positive Possible  contamination Urine culture negative S/p vancomycin , DC'd due to renal failure 1/2 started Invanz  1/3 started daptomycin  ID consult appreciated    # Elevated troponin / Type II  likely  demand ischemia  S/p IV heparin  dc TTE LVEF >55, Left ventricular endocardial border not optimally defined to evaluate regional wall motion, moderate LVH. Cardiology consulted recommended no intervention.  Signed off on 12/31     # Gross hematuria: Resolved  Question due to UTI Monitor CBC   # Hyperkalemia: Resolved  D/t AKI Treat underlying cause(s) as noted  S/p lokelma . Follow BMP  # Hypokalemia 1/3 low potassium 3.1, repleted with IV fluid 1/4 K 3.2 low, potassium repleted Monitor electrolytes daily  # Hypophosphatemia, Phos repleted. Monitor electrolytes daily and replete as needed.   # Essential hypertension but low BP due to sepsis Hold amlodipine , lisinopril     # Coronary artery disease ASA, Plavix , statin Hold antiypertensives    Gout Hold allopurinol  w/ AKI   Depression Continue Zoloft  home dose 175 mg daily --> 150  Abilify     GERD without esophagitis PPI   Seizure disorder continue Keppra  Check keppra  levels    Constipation Bowel regimen per orders   Type 2 diabetes mellitus with peripheral neuropathy  Not on home antihyperglycemics No glucosuria Holding SSI Monitor Glc achs for now and initiate insulin  if needed  A1C hold Neurontin  w/ AKI and encephalopathy   Prednisone rx outpatient started 12/26 20 mg daily NOT chronic medication Uncertain why but suspect as above he was treated for presumed respiratory illness at faciltiy - will dc this  cortisol level wnl  # Folic acid  deficiency: started folic acid  1 mg p.o. daily.  Follow-up with PCP to repeat folic acid  level in between 3 to 6 months.  1/5 gradually patient's mental status is improving with improvement in renal functions, creatinine and BUN.  Palliative care following for goals of care discussion    Body mass index is 30.6 kg/m.  Interventions:  Diet: NPO/continue NG tube feeds DVT Prophylaxis: Subcutaneous Heparin     Advance goals of care discussion:  DNR-limited  Family Communication: family was not present at bedside, at the time of interview.  The pt provided permission to discuss medical plan with the family. Opportunity was given to ask question and all questions were answered satisfactorily.  12/31 called and left a VM for his sister [name] [phone]  09/25/24 I spoke to patient's sister today and updated the management plan and his current situation, poor prognosis. 1/3 I spoke to patient's [name], and updated her regarding his brother's condition.   Disposition:  Pt is from SNF, admitted with sepsis, AMS, AKI, uremia, still critically ill, which precludes a safe discharge. Discharge to SNF, when stable, may need few days to improve.  Subjective: No significant events overnight.  Patient still seems lethargic and dozing off during my rounding.  Patient was able to open eyes and then dozed off again, did not offer any complaints.    Physical Exam: General: NAD, resting comfortably Eyes: PERRLA ENT: Oral Mucosa Clear, and dry Neck: no JVD,  Cardiovascular: S1 and S2 Present, no Murmur,  Respiratory: good respiratory effort, Bilateral Air entry equal and Decreased, no Crackles, no wheezes Abdomen: Bowel Sound present, Soft and no tenderness,  Skin: no rashes Extremities: no Pedal edema, no calf tenderness Neurologic: Left-sided residual weakness due to prior CVA.  AO x 1, opened his eyes    Vitals:   09/30/24 0629 09/30/24 0800 09/30/24 1139 09/30/24 1535  BP:  128/89 138/71 130/79  Pulse:  73 69 68  Resp:      Temp:  98.3 F (36.8 C) 97.6 F (36.4 C) (!) 97.5 F (36.4 C)  TempSrc:  Oral Axillary Axillary  SpO2:  96% 93% 98%  Weight: 94 kg     Height:        Intake/Output Summary (Last 24 hours) at 09/30/2024 1856 Last data filed at 09/30/2024 1800 Gross per 24 hour  Intake 3340.56 ml  Output 3350 ml  Net -9.44 ml   Filed Weights   09/28/24 0500 09/29/24 0500 09/30/24 0629  Weight:  82.7 kg 82.6 kg 94 kg    Data Reviewed: I have personally reviewed and interpreted daily labs, tele strips, imagings as discussed above. I reviewed all nursing notes, pharmacy notes, vitals, pertinent old records I have discussed plan of care as described above with RN and patient/family.  CBC: Recent Labs  Lab 09/25/24 0310 09/26/24 0320 09/27/24 0423 09/28/24 0421 09/29/24 0431  WBC 8.6 5.9 5.6 6.5 8.1  HGB 9.9* 9.8* 9.7* 9.7* 9.8*  HCT 31.0* 30.0* 29.7* 30.0* 30.5*  MCV 93.4 92.6 91.4 91.7 92.7  PLT 129* 122* 120* 125* 142*   Basic Metabolic Panel: Recent Labs  Lab 09/25/24 0310 09/25/24 1411 09/26/24 0320 09/27/24 0423 09/27/24 1954 09/28/24 0421 09/28/24 1558 09/29/24 0431 09/30/24 0352  NA 142   < > 145 146* 149* 152* 150* 148* 146*  K 4.2   < > 3.9 3.1* 3.1* 3.2* 3.9 4.0 4.4  CL 100   < > 102 105 107 109 110 110 109  CO2 17*   < > 20* 27 27 28 27 27 26   GLUCOSE 62*   < > 108* 188* 217* 173* 160* 169* 140*  BUN 111*   < > 102* 84* 79* 74* 70* 63* 60*  CREATININE 5.70*   < > 5.04* 4.16* 3.63* 3.50* 3.22* 3.16* 3.26*  CALCIUM  7.8*   < > 8.0* 8.1* 7.7* 7.9* 7.8* 8.3* 7.8*  MG 2.3  --  2.3 2.2  --  2.0  --   --  1.8  PHOS 5.9*  --  4.6 3.5  --  2.7  --  1.3* 2.5   < > = values in this interval not displayed.    Studies: DG Abd 1 View Result Date: 09/29/2024 CLINICAL DATA:  NG tube placement EXAM: ABDOMEN - 1 VIEW COMPARISON:  09/26/2024 FINDINGS: Enteric tube tip and side port overlie the expected location of mid stomach. Pleural effusions and airspace disease at the bases. IMPRESSION: Enteric tube tip and side port overlie the expected location of the mid stomach. Electronically Signed   By: Luke Bun M.D.   On: 09/29/2024 22:29     Scheduled Meds:  ARIPiprazole   2 mg Per Tube QHS   artificial tears  1 drop Both Eyes QHS   vitamin C   500 mg Per Tube BID   aspirin   81 mg Per Tube Daily   clopidogrel   75 mg Per Tube Daily   cyanocobalamin   1,000 mcg  Intramuscular Daily   Followed by   NOREEN ON 10/02/2024] vitamin B-12  1,000 mcg Per Tube Daily   ergocalciferol  (VITAMIN D2)  50,000 Units Per Tube Weekly   feeding supplement (PROSource TF20)  60 mL Per Tube Daily   fiber supplement (BANATROL TF)  60 mL Per Tube BID   folic acid   1 mg Per Tube Daily   free water   300 mL Per Tube  Q4H   guaiFENesin   10 mL Per Tube Q4H   heparin  injection (subcutaneous)  5,000 Units Subcutaneous Q8H   insulin  aspart  0-9 Units Subcutaneous Q4H   levETIRAcetam   500 mg Per Tube Q24H   Or   levETIRAcetam   500 mg Intravenous Q24H   multivitamin with minerals  1 tablet Per Tube Daily   omeprazole   20 mg Per Tube BID   mouth rinse  15 mL Mouth Rinse 4 times per day   phosphorus  500 mg Per NG tube QID   senna-docusate  2 tablet Per Tube QHS   sertraline   175 mg Per Tube Daily   thiamine   100 mg Per Tube Daily   zinc  sulfate (50mg  elemental zinc )  220 mg Per Tube Daily   Continuous Infusions:  feeding supplement (OSMOLITE 1.5 CAL) 55 mL/hr at 09/30/24 0400   PRN Meds: acetaminophen  **OR** acetaminophen , camphor-menthol , ipratropium-albuterol , lactulose , loperamide  HCl, ondansetron  **OR** ondansetron  (ZOFRAN ) IV, mouth rinse  Time spent: 35 minutes  Author: ELVAN SOR. MD [facility] 09/30/2024 6:56 PM  To reach On-call, see care teams to locate the attending and reach out to them via [email or web address]. If 7PM-7AM, please contact night-coverage If you still have difficulty reaching the attending provider, please page the [facility] (Director on Call) for [facility] on amion for assistance.

## 2024-09-30 NOTE — Progress Notes (Signed)
 Sacral and ankle wounds cleansed. Foam dressing and off loading in place for treatment.

## 2024-10-01 DIAGNOSIS — Z7189 Other specified counseling: Secondary | ICD-10-CM | POA: Diagnosis not present

## 2024-10-01 DIAGNOSIS — G9341 Metabolic encephalopathy: Secondary | ICD-10-CM | POA: Diagnosis not present

## 2024-10-01 LAB — GLUCOSE, CAPILLARY
Glucose-Capillary: 134 mg/dL — ABNORMAL HIGH (ref 70–99)
Glucose-Capillary: 131 mg/dL — ABNORMAL HIGH (ref 70–99)
Glucose-Capillary: 120 mg/dL — ABNORMAL HIGH (ref 70–99)
Glucose-Capillary: 155 mg/dL — ABNORMAL HIGH (ref 70–99)
Glucose-Capillary: 116 mg/dL — ABNORMAL HIGH (ref 70–99)
Glucose-Capillary: 133 mg/dL — ABNORMAL HIGH (ref 70–99)

## 2024-10-01 LAB — CBC
HCT: 28.2 % — ABNORMAL LOW (ref 39.0–52.0)
Hemoglobin: 8.9 g/dL — ABNORMAL LOW (ref 13.0–17.0)
MCH: 30.2 pg (ref 26.0–34.0)
MCHC: 31.6 g/dL (ref 30.0–36.0)
MCV: 95.6 fL (ref 80.0–100.0)
Platelets: 223 K/uL (ref 150–400)
RBC: 2.95 MIL/uL — ABNORMAL LOW (ref 4.22–5.81)
RDW: 13.1 % (ref 11.5–15.5)
WBC: 9.1 K/uL (ref 4.0–10.5)
nRBC: 0 % (ref 0.0–0.2)

## 2024-10-01 LAB — MAGNESIUM: Magnesium: 1.9 mg/dL (ref 1.7–2.4)

## 2024-10-01 LAB — BASIC METABOLIC PANEL WITH GFR
Anion gap: 13 (ref 5–15)
BUN: 61 mg/dL — ABNORMAL HIGH (ref 8–23)
CO2: 25 mmol/L (ref 22–32)
Calcium: 8.1 mg/dL — ABNORMAL LOW (ref 8.9–10.3)
Chloride: 107 mmol/L (ref 98–111)
Creatinine, Ser: 3.39 mg/dL — ABNORMAL HIGH (ref 0.61–1.24)
GFR, Estimated: 18 mL/min — ABNORMAL LOW
Glucose, Bld: 130 mg/dL — ABNORMAL HIGH (ref 70–99)
Potassium: 5.3 mmol/L — ABNORMAL HIGH (ref 3.5–5.1)
Sodium: 145 mmol/L (ref 135–145)

## 2024-10-01 LAB — PHOSPHORUS: Phosphorus: 4.2 mg/dL (ref 2.5–4.6)

## 2024-10-01 MED ORDER — NUTRISOURCE FIBER PO PACK
1.0000 | PACK | Freq: Two times a day (BID) | ORAL | Status: DC
  Administered 2024-10-01 – 2024-10-02 (×3): 1
  Filled 2024-10-01 (×3): qty 1

## 2024-10-01 MED ORDER — SODIUM ZIRCONIUM CYCLOSILICATE 10 G PO PACK
10.0000 g | PACK | Freq: Once | ORAL | Status: AC
  Administered 2024-10-01: 10 g via ORAL
  Filled 2024-10-01: qty 1

## 2024-10-01 NOTE — Progress Notes (Signed)
 "                                                                                                                                                                                                          Daily Progress Note   Patient Name: Rodney Estrada       Date: 10/01/2024 DOB: 10-03-1950  Age: 74 y.o. MRN#: [id number] Attending Physician: Dezii, Alexandra, DO Primary Care Physician: Pcp, No Admit Date: 09/22/2024  Reason for Consultation/Follow-up: Continuing goals of care  Subjective: Notes and labs reviewed for the past 24 hours.  Renal function with GFR less than 18, and today potassium is 5.3.  Nephrology is following closely.  In to see patient.  He remains in mittens with an NG tube in place.  He opens his eyes briefly and closes them again.  No family at bedside.  Attempted to call sister again today and phone call went directly to voicemail without ringing.  VM left.  Sister called back to PMT phone service, but upon calling sister again, phone call was forwarded to voicemail again.  Case reviewed with attending.  Addendum: Sister called back.  She discusses that he has been residing in his facility since his stroke 4-1/2 years ago.  She discusses his decline.  We discussed his current mittens, his current NG tube, and his current renal function.  We discussed various scenarios and quantity of life versus quality of life.  She discusses being a woman of faith but understandably having difficulty in making these difficult decisions for her brother.  She states she would like to provide 1 more day to see how he does.  Questions answered regarding hospice and comfort care.  Attending updated.  Length of Stay: 9  Current Medications: Scheduled Meds:   ARIPiprazole   2 mg Per Tube QHS   artificial tears  1 drop Both Eyes QHS   vitamin C   500 mg Per Tube BID   aspirin   81 mg Per Tube Daily   clopidogrel   75 mg Per Tube Daily   [START ON 10/02/2024] vitamin B-12  1,000 mcg Per Tube Daily    ergocalciferol  (VITAMIN D2)  50,000 Units Per Tube Weekly   feeding supplement (PROSource TF20)  60 mL Per Tube Daily   fiber  1 packet Per Tube BID   folic acid   1 mg Per Tube Daily   free water   300 mL Per Tube Q4H   guaiFENesin   10 mL Per Tube Q4H   heparin  injection (subcutaneous)  5,000 Units Subcutaneous Q8H   insulin  aspart  0-9 Units Subcutaneous Q4H   levETIRAcetam   500 mg Per Tube Q24H   Or   levETIRAcetam   500 mg Intravenous Q24H   multivitamin with minerals  1 tablet Per Tube Daily   omeprazole   20 mg Per Tube BID   mouth rinse  15 mL Mouth Rinse 4 times per day   senna-docusate  2 tablet Per Tube QHS   sertraline   175 mg Per Tube Daily   zinc  sulfate (50mg  elemental zinc )  220 mg Per Tube Daily    Continuous Infusions:  feeding supplement (OSMOLITE 1.5 CAL) 1,000 mL (10/01/24 1049)    PRN Meds: acetaminophen  **OR** acetaminophen , camphor-menthol , ipratropium-albuterol , lactulose , loperamide  HCl, ondansetron  **OR** ondansetron  (ZOFRAN ) IV, mouth rinse  Physical Exam Constitutional:      Comments: Opens eyes briefly and closed them.  Pulmonary:     Effort: Pulmonary effort is normal.  Skin:    General: Skin is warm and dry.  Psychiatric:     Comments: Mittens in place             Vital Signs: BP (!) 142/74 (BP Location: Right Arm)   Pulse 69   Temp 97.7 F (36.5 C) (Axillary)   Resp 18   Ht 5' 9 (1.753 m)   Wt 89 kg   SpO2 93%   BMI 28.98 kg/m  SpO2: SpO2: 93 % O2 Device: O2 Device: Room Air O2 Flow Rate: O2 Flow Rate (L/min): 1 L/min  Intake/output summary:  Intake/Output Summary (Last 24 hours) at 10/01/2024 1321 Last data filed at 10/01/2024 1231 Gross per 24 hour  Intake 3095 ml  Output 4350 ml  Net -1255 ml   LBM: Last BM Date : 09/30/24 Baseline Weight: Weight: 81.1 kg Most recent weight: Weight: 89 kg   Patient Active Problem List   Diagnosis Date Noted   Urinary tract infection without hematuria 09/29/2024   Malnutrition of moderate  degree 09/26/2024   Pyelonephritis 09/26/2024   Uremic encephalopathy 09/26/2024   Elevated troponin 09/23/2024   Acute metabolic encephalopathy 09/22/2024   Acute lower UTI 09/22/2024   Acute kidney injury superimposed on chronic kidney disease 09/22/2024   Type 2 diabetes mellitus with peripheral neuropathy (HCC) 09/22/2024   Seizure disorder (HCC) 09/22/2024   GERD without esophagitis 09/22/2024   Depression 09/22/2024   Gout 09/22/2024   Dyslipidemia 09/22/2024   Hyperkalemia 09/22/2024   Coronary artery disease 09/22/2024   Hypernatremia 07/31/2022   Hiccups 07/28/2022   Hypokalemia 07/27/2022   SBO (small bowel obstruction) (HCC) 07/23/2022   Nephrolithiasis 07/23/2022   Partial intestinal obstruction (HCC)    Cerebral edema (HCC) 04/16/2020   Hypertensive emergency 04/16/2020   Essential hypertension 04/16/2020   Hyperlipidemia LDL goal <70 04/16/2020   Type 2 diabetes mellitus (HCC) 04/16/2020   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 04/16/2020   AKI (acute kidney injury) (HCC) in the setting of stage IIIa chronic kidney disease 04/16/2020   Tobacco use disorder 04/16/2020   Alcohol  abuse 04/16/2020   Cocaine abuse (HCC) 04/16/2020   Obesity 04/16/2020   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   In Pre-Arrest Conditions (Patient Is Breathing and Has A Pulse) Do not intubate. Provide all appropriate non-invasive medical interventions. Avoid ICU transfer unless indicated or required.   Consent: Discussion documented in EHR or advanced directives reviewed       09/23/24 0542           Code Status History     Date Active Date Inactive Code Status Order ID Comments User Context   09/22/2024 2108 09/23/2024 0542 Full Code [id number]  Lawence Madison LABOR, MD ED   07/23/2022 1425 08/08/2022 0354 Full Code [id number]  Arnett Saunders, MD ED   04/05/2020 1121 04/19/2020 2338 Full Code [id number]  Claudene Alm SAUNDERS, PA-C ED       Prognosis: Poor   Care plan was discussed with attending  Thank you for allowing the Palliative Medicine Team to assist in the care of this patient.   Time In: 1:00 Time Out: 2:00 Total Time Prolonged Time Billed  no       Greater than 50%  of this time was spent counseling and coordinating care related to the above assessment and plan.  Camelia Lewis, NP  Please contact Palliative Medicine Team phone at [phone] for questions and concerns.       "

## 2024-10-01 NOTE — Progress Notes (Signed)
 Speech Language Pathology Treatment: Dysphagia  [name] MRN: [id number] DOB: Feb 07, 1951 Today's Date: 10/01/2024 Time: 8869-8784 SLP Time Calculation (min) (ACUTE ONLY): 45 min  Assessment / Plan / Recommendation Clinical Impression  Pt seen today for ongoing assessment of swallowing; po trials in hopes to initiate an oral diet. Pt awakened w/ Min+ verbal/tactile stim; seemed less drowsy but NSG reported ongoing sleepiness off/on all day. Pt nodded head then said ick and shook head no towards end of po's; not much other verbal communication. Unsure of Baseline Cognitive functioning.  NGT present w/ TFs ongoing.  Noted Palliative Care is following.  On RA, afebrile. WBC WNL.   Pt appears to present w/ grossly functional oropharyngeal phase swallowing w/ a modified diet consistency of Purees and Nectar consistency liquids; somewhat limited po trials/consistencies d/t pt's decline of po's. Pt has NGT TFs continuous.  No gross, overt s/s of oropharyngeal phase dysphagia noted; functional neuromuscular abilities noted w/ trials given. However, pt exhibited decreased Cognitive Awareness during po tasks intermittently w/ slower oral phase, which can impact the pharyngeal phase of swallowing and increase risk for aspiration event.  Pt consumed the few po trials w/ No immediate, overt clinical s/s of aspiration during the trials when following aspiration precautions and being fed by SLP. However, he has challenging factors that could impact oropharyngeal swallowing to include impact of suspected decline in Cognitive functioning/engagement; reduced Awareness overall and poor State and desire for oral intake; reduced engagement and frequent drowsiness;; dependent feeder; deconditioning/weakness; old CVA w/ dysphagia in 2021; NGT TFs continuous. These factors can increase risk for aspiration, dysphagia as well as decreased oral intake overall.    Pt was assessed w/ NGT present.  During po trials, pt required Min+ verbal/tactile/visual cues for initiation and follow through during oral prep and oral phase. Once he engaged in/accepted po boluses, manipulation w/ the purees(frozen ice cream) and the Nectar liquids appeared grossly [facility] w/ only intermittent lengthier oral phase time noted. He consumed the consistencies w/ no overt coughing, decline in vocal quality, or change in respiratory presentation during/post trials. O2 sats remained in upper 90s when checked. Oral prep and oral phase appeared more timely today w/ less impact from the Cognitive decline and decreased Awareness to tasks/environment. Oral clearing achieved w/ all trial consistencies.   OF NOTE: he declined further po trials during the session saying ick and shaking his head no when asked if he wanted more.     Recommend trial of Dysphagia level 1 diet(gravies to moisten and flavor) w/ Nectar liquids; carefully monitor straw use, and pt should help Hold Cup when drinking if able to increase Cognitive engagement. Recommend general aspiration precautions to include small bites/sips Slowly, sit fully upright for oral intake, tray setup and Supervision during meals, reduce distractions. REFLUX precautions. Pills CRUSHED in Puree for safer, easier swallowing. Feeding assistance.   Pt must demonstrate adequate Alertness and Awareness for oral intake/meals. Recommend frequent oral care for hygiene and stimulation of swallowing w/ NSG staff.  Education given on the above to NSG, MD, and Team via secure chat. ST services will monitor pt's status next 2-3 days for toleration of diet and further needs. Recommend ongoing Dietician support; Palliative Care. Precautions posted in chart, room.          HPI HPI: Per chart notes, pt is a 74 y.o. 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  He has been nonverbal with increased tremors.  No reported fever or chills.  SNF staff noted congested cough and therefore the patient was given IM Rocephin  this afternoon.  The patient was nonverbal during my interview and would answer some questions nodding his head.    CXR at admit: No acute cardiopulmonary abnormality.  2. Low lung volumes.   MRI this admit: No acute intracranial abnormality.  2. Chronic right MCA territory encephalomalacia with ex vacuo dilatation of the  right lateral ventricle, extensive right hemispheric gliosis, and Wallerian  degeneration of the right cerebral peduncle.  Unsure of pt's Baseline Cognition.      SLP Plan  Continue with current plan of care (monitor)        Swallow Evaluation Recommendations   Recommendations: PO diet PO Diet Recommendation: Dysphagia 1 (Pureed);Mildly thick liquids (Level 2, nectar thick) Liquid Administration via: Cup;Straw (monitor) Medication Administration: Crushed with puree Supervision: Full assist for feeding;Full supervision/cueing for swallowing strategies Postural changes: Position pt fully upright for meals;Stay upright 30-60 min after meals;Out of bed for meals Oral care recommendations: Oral care BID (2x/day);Staff/trained caregiver to provide oral care;Oral care before PO Recommended consults: Consider dietitian consultation;Consider Palliative care Caregiver Recommendations: Avoid jello, ice cream, thin soups, popsicles;Remove water  pitcher;Have oral suction available     Recommendations   See above                 (TBD) Oral care BID;Oral care before and after PO;Staff/trained caregiver to provide oral care   Frequent or constant  Supervision/Assistance Dysphagia, oropharyngeal phase (R13.12) (impact from Cognitive decline; poor State and desire for oral intake; reduced engagement and frequent drowsiness; deconditioned; NGT TFs continuous; feeding dependency)     Continue with current plan of care (monitor)       Comer Portugal, MS, CCC-SLP Speech Language Pathologist Rehab Services; [facility] [place] [phone] (ascom) Jewelianna Pancoast  10/01/2024, 5:02 PM

## 2024-10-01 NOTE — Progress Notes (Signed)
 " PROGRESS NOTE    Rodney Estrada  FMW:[id number] DOB: [birth year] DOA: 09/22/2024 PCP: Pcp, No  No chief complaint on file.   Hospital Course:  Rodney Estrada is a 74 year old male with CAD, chronic systolic CHF, gout, hypertension, dyslipidemia, A-fib, prior CVA, type 2 diabetes, who presented to the ED 12/29 with acute onset altered mental status from his SNF.  Patient had been nonverbal with increasing tremors.  On arrival to the ED he was found to have UTI, started on IV fluids, vancomycin , cefepime .  He was also found to have AKI on CKD stage III E with associated hyperkalemia.  There is also concern for elevated troponin so heparin  drip was started.  Patient then developed hematuria so heparin  drip was discontinued.    Subjective: On my evaluation this morning patient opens eyes only with sternal rub.  He tracks briefly but does not communicate or attempt to verbalize   Objective: Vitals:   09/30/24 2345 10/01/24 0400 10/01/24 0600 10/01/24 0741  BP: (!) 157/76 (!) 158/78  134/71  Pulse: 68 73  67  Resp: 20 18    Temp: 98.2 F (36.8 C) 98.3 F (36.8 C)  97.8 F (36.6 C)  TempSrc:    Axillary  SpO2: 93% 94%  96%  Weight:   89 kg   Height:        Intake/Output Summary (Last 24 hours) at 10/01/2024 0941 Last data filed at 10/01/2024 0830 Gross per 24 hour  Intake 3095 ml  Output 2750 ml  Net 345 ml   Filed Weights   09/29/24 0500 09/30/24 0629 10/01/24 0600  Weight: 82.6 kg 94 kg 89 kg    Examination: General exam: Appears calm and comfortable, NAD  Respiratory system: No work of breathing, symmetric chest wall expansion Cardiovascular system: S1 & S2 heard, RRR.  Gastrointestinal system: Abdomen is nondistended, soft and nontender, NGT in place Neuro: Awakens to sternal rub, tracks briefly.  Does not follow other commands.  Assessment & Plan:  Principal Problem:   Acute metabolic encephalopathy Active Problems:   Acute lower UTI   Elevated troponin   Acute kidney  injury superimposed on chronic kidney disease   Hyperkalemia   Essential hypertension   Type 2 diabetes mellitus with peripheral neuropathy (HCC)   Seizure disorder (HCC)   GERD without esophagitis   Depression   Gout   Dyslipidemia   Coronary artery disease   Malnutrition of moderate degree   Pyelonephritis   Uremic encephalopathy   Urinary tract infection without hematuria    Severe sepsis secondary to UTI - On arrival: Leukocytosis, tachycardic, tachypneic, hypotensive - Fluid responsive, did not require pressors - Was transferred to the ICU initially but lactic acid within normal range and vitals improved to transfer to progressive instead - Status post vancomycin  which was discontinued - Status post ceftriaxone  for 1/2 - Invanz  per infectious disease 1/2 - 1/6, daptomycin  per ID 1/3-1/5  Acute metabolic encephalopathy secondary to uremia history of large CVA 2021, residual left-sided hemiparesis - Brain MRI: No acute intracranial abnormality.  Chronic right MCA territory encephalomalacia with ex vacuo dilation of the right lateral ventricle, extensive right hemispheric gliosis and wallerian degeneration of the right cerebral peduncle.  Multifocal T2 hyperintense signal within the cerebral white matter most likely secondary to chronic small vessel disease - Keppra  levels elevated - Tatian waxes and wanes - Patient has not been able to sustain oral nutrition.  Currently on NG tube feeds.  SLP recommending PEG if patient does not  proceed with hospice - Patient has not returned to his prior baseline.  Remains very somnolent and unable to tolerate p.o.  Palliative care has been consulted and working with the family.  At this time would like 1 more day before deciding on final disposition.  Likely hospice.  AKI CKD stage IIIa Metabolic acidosis History of chronic left hydronephrosis History of kidney stones and left ureteral stent placement and removal in February 2024 - Nephrology  was consulted for hemodialysis - Patient started on bicarb infusion - Renal function slowly improving - Nephrology is recommending to avoid hemodialysis for now - Continue to monitor creatinine closely - Avoid nephrotoxic medications  Acute lower UTI - Status post IV Rocephin  - 1/1 repeated urine cultures negative - Antibiotic course as below - ID following  Staph epidermidis blood culture positive - Presumed contamination - Urine culture negative - ID consultation as above.  Antibiotic course as below  Elevated troponin - Thought to be secondary to demand ischemia - Status post IV heparin  which has since been discontinued - Cardiology was consulted and recommended no intervention.  Signed off on 12/31 - Echocardiogram: LVEF over 55%, left ventricular endocardial border not optimally defined to evaluate RWMA.  Moderate LVH  Gross hematuria - Resolved - May have been secondary to UTI with heparin  drip  Hyperkalemia Hypokalemia Hypophosphatemia - Resolved - Monitor BMP  Essential hypertension - Blood pressure is initially low secondary to sepsis - Gradually resume home meds as needed  CAD - At home on aspirin , Plavix , statin - Gradually resuming antihypertensives  Gout - Allopurinol  currently on hold secondary to AKI  Depression - Continue home dose Zoloft  and Abilify   GERD without esophagitis - Continue PPI  Seizure disorder - Continue Keppra   Constipation - Continue bowel regimen  Type 2 diabetes with peripheral neuropathy - Not on antihyperglycemic's at home - Hemoglobin A1c: 5.6%  Steroid therapy - Patient was on 20 mg prednisone at home which was started on 12/26.  This is not a chronic medication.  Was discontinued earlier this admission  Acute on chronic normocytic anemia Anemia of chronic disease, iron deficiency anemia Folate deficiency - Baseline hemoglobin around 10, has been gradually downtrending throughout this admission likely secondary  to renal disease.  Acute hematuria resolved.  Monitor. - Anemia panel: Iron 21, TIBC 253, saturation is low - Transfuse as needed for hemoglobin under 7  DVT prophylaxis: Heparin    Code Status: Limited: Do not attempt resuscitation (DNR) -DNR-LIMITED -Do Not Intubate/DNI  Disposition:  Pending final dispo decisions  Consultants:  Treatment Team:  Consulting Physician: Alluri, Keller BROCKS, MD Consulting Physician: Dennise Capri, MD Consulting Physician: Malka Domino, MD Consulting Physician: Fayette Bodily, MD  Procedures:    Antimicrobials:  Anti-infectives (From admission, onward)    Start     Dose/Rate Route Frequency Ordered Stop   09/30/24 1245  ertapenem  (INVANZ ) 500 mg in sodium chloride  0.9 % 50 mL IVPB        500 mg 100 mL/hr over 30 Minutes Intravenous Every 24 hours 09/30/24 0927 09/30/24 1239   09/28/24 1400  DAPTOmycin  (CUBICIN ) 600 mg in sodium chloride  0.9 % IVPB  Status:  Discontinued        600 mg 124 mL/hr over 30 Minutes Intravenous Every 48 hours 09/27/24 0742 09/29/24 1132   09/27/24 1400  DAPTOmycin  (CUBICIN ) 600 mg in sodium chloride  0.9 % IVPB  Status:  Discontinued        600 mg 124 mL/hr over 30 Minutes Intravenous Every 48  hours 09/26/24 0712 09/26/24 1145   09/26/24 1400  DAPTOmycin  (CUBICIN ) 600 mg in sodium chloride  0.9 % IVPB  Status:  Discontinued        600 mg 124 mL/hr over 30 Minutes Intravenous Every 48 hours 09/25/24 0936 09/26/24 0712   09/26/24 1215  ertapenem  (INVANZ ) 500 mg in sodium chloride  0.9 % 50 mL IVPB  Status:  Discontinued        500 mg 100 mL/hr over 30 Minutes Intravenous Every 24 hours 09/26/24 1159 09/30/24 0927   09/25/24 1400  DAPTOmycin  (CUBICIN ) 600 mg in sodium chloride  0.9 % IVPB  Status:  Discontinued        8 mg/kg  77 kg 124 mL/hr over 30 Minutes Intravenous Every 48 hours 09/25/24 0921 09/25/24 0923   09/24/24 0115  vancomycin  (VANCOREADY) IVPB 1750 mg/350 mL        1,750 mg 175 mL/hr over 120  Minutes Intravenous  Once 09/24/24 0104 09/24/24 0505   09/23/24 1800  cefTRIAXone  (ROCEPHIN ) 2 g in sodium chloride  0.9 % 100 mL IVPB  Status:  Discontinued        2 g 200 mL/hr over 30 Minutes Intravenous Every 24 hours 09/23/24 1446 09/26/24 1159   09/23/24 1448  vancomycin  variable dose per unstable renal function (pharmacist dosing)  Status:  Discontinued         Does not apply See admin instructions 09/23/24 1448 09/25/24 0921   09/22/24 1915  vancomycin  (VANCOREADY) IVPB 1750 mg/350 mL        1,750 mg 175 mL/hr over 120 Minutes Intravenous  Once 09/22/24 1849 09/23/24 0113   09/22/24 1845  ceFEPIme  (MAXIPIME ) 2 g in sodium chloride  0.9 % 100 mL IVPB        2 g 200 mL/hr over 30 Minutes Intravenous  Once 09/22/24 1840 09/22/24 1923   09/22/24 1845  vancomycin  (VANCOCIN ) IVPB 1000 mg/200 mL premix  Status:  Discontinued        1,000 mg 200 mL/hr over 60 Minutes Intravenous  Once 09/22/24 1840 09/22/24 1848       Data Reviewed: I have personally reviewed following labs and imaging studies CBC: Recent Labs  Lab 09/26/24 0320 09/27/24 0423 09/28/24 0421 09/29/24 0431 10/01/24 0422  WBC 5.9 5.6 6.5 8.1 9.1  HGB 9.8* 9.7* 9.7* 9.8* 8.9*  HCT 30.0* 29.7* 30.0* 30.5* 28.2*  MCV 92.6 91.4 91.7 92.7 95.6  PLT 122* 120* 125* 142* 223   Basic Metabolic Panel: Recent Labs  Lab 09/26/24 0320 09/27/24 0423 09/27/24 1954 09/28/24 0421 09/28/24 1558 09/29/24 0431 09/30/24 0352 10/01/24 0422  NA 145 146*   < > 152* 150* 148* 146* 145  K 3.9 3.1*   < > 3.2* 3.9 4.0 4.4 5.3*  CL 102 105   < > 109 110 110 109 107  CO2 20* 27   < > 28 27 27 26 25   GLUCOSE 108* 188*   < > 173* 160* 169* 140* 130*  BUN 102* 84*   < > 74* 70* 63* 60* 61*  CREATININE 5.04* 4.16*   < > 3.50* 3.22* 3.16* 3.26* 3.39*  CALCIUM  8.0* 8.1*   < > 7.9* 7.8* 8.3* 7.8* 8.1*  MG 2.3 2.2  --  2.0  --   --  1.8 1.9  PHOS 4.6 3.5  --  2.7  --  1.3* 2.5 4.2   < > = values in this interval not displayed.    GFR: Estimated Creatinine Clearance: 21.7 mL/min (A) (by  C-G formula based on SCr of 3.39 mg/dL (H)). Liver Function Tests: No results for input(s): AST, ALT, ALKPHOS, BILITOT, PROT, ALBUMIN in the last 168 hours. CBG: Recent Labs  Lab 09/30/24 1533 09/30/24 2027 09/30/24 2346 10/01/24 0354 10/01/24 0744  GLUCAP 129* 118* 108* 134* 131*    Recent Results (from the past 240 hours)  Culture, blood (routine x 2)     Status: Abnormal   Collection Time: 09/22/24  5:37 PM   Specimen: BLOOD LEFT HAND  Result Value Ref Range Status   Specimen Description   Final    BLOOD LEFT HAND Performed at [facility], [address] [place], [place] [address]    Special Requests   Final    BOTTLES DRAWN AEROBIC AND ANAEROBIC Blood Culture results may not be optimal due to an inadequate volume of blood received in culture bottles Performed at [facility], [address] [place], [place] [address]    Culture  Setup Time   Final    GRAM POSITIVE COCCI AEROBIC BOTTLE ONLY CRITICAL VALUE NOTED.  VALUE IS CONSISTENT WITH PREVIOUSLY REPORTED AND CALLED VALUE. Performed at [facility], [address] [place], [place] [address]    Culture STAPHYLOCOCCUS EPIDERMIDIS (A)  Final   Report Status 09/25/2024 FINAL  Final   Organism ID, Bacteria STAPHYLOCOCCUS EPIDERMIDIS  Final      Susceptibility   Staphylococcus epidermidis - MIC*    CIPROFLOXACIN 4 RESISTANT Resistant     ERYTHROMYCIN >=8 RESISTANT Resistant     GENTAMICIN  >=16 RESISTANT Resistant     OXACILLIN >=4 RESISTANT Resistant     TETRACYCLINE <=1 SENSITIVE Sensitive     VANCOMYCIN  2 SENSITIVE Sensitive     TRIMETH/SULFA 160 RESISTANT Resistant     CLINDAMYCIN >=8 RESISTANT Resistant     RIFAMPIN <=0.5 SENSITIVE Sensitive     Inducible Clindamycin NEGATIVE Sensitive     * STAPHYLOCOCCUS EPIDERMIDIS  Culture, blood (routine x 2)     Status: Abnormal   Collection Time: 09/22/24  5:57 PM    Specimen: Right Antecubital; Blood  Result Value Ref Range Status   Specimen Description   Final    RIGHT ANTECUBITAL Performed at [facility], [address] [place], [place] [address]    Special Requests   Final    BOTTLES DRAWN AEROBIC AND ANAEROBIC Blood Culture results may not be optimal due to an inadequate volume of blood received in culture bottles Performed at [facility], [address] [place], [place] [address]    Culture  Setup Time   Final    GRAM POSITIVE COCCI ANAEROBIC BOTTLE ONLY CRITICAL RESULT CALLED TO, READ BACK BY AND VERIFIED WITH: EMILY STEINBOCK 09/23/24 1424 KLW    Culture STAPHYLOCOCCUS EPIDERMIDIS (A)  Final   Report Status 09/25/2024 FINAL  Final   Organism ID, Bacteria STAPHYLOCOCCUS EPIDERMIDIS  Final      Susceptibility   Staphylococcus epidermidis - MIC*    CIPROFLOXACIN <=0.5 SENSITIVE Sensitive     ERYTHROMYCIN >=8 RESISTANT Resistant     GENTAMICIN  >=16 RESISTANT Resistant     OXACILLIN >=4 RESISTANT Resistant     TETRACYCLINE <=1 SENSITIVE Sensitive     VANCOMYCIN  1 SENSITIVE Sensitive     TRIMETH/SULFA 160 RESISTANT Resistant     CLINDAMYCIN <=0.25 SENSITIVE Sensitive     RIFAMPIN <=0.5 SENSITIVE Sensitive     Inducible Clindamycin NEGATIVE Sensitive     * STAPHYLOCOCCUS EPIDERMIDIS  Blood Culture ID Panel (Reflexed)  Status: Abnormal   Collection Time: 09/22/24  5:57 PM  Result Value Ref Range Status   Enterococcus faecalis NOT DETECTED NOT DETECTED Final   Enterococcus Faecium NOT DETECTED NOT DETECTED Final   Listeria monocytogenes NOT DETECTED NOT DETECTED Final   Staphylococcus species DETECTED (A) NOT DETECTED Final    Comment: CRITICAL RESULT CALLED TO, READ BACK BY AND VERIFIED WITH: EMILY STEINBOCK 09/23/24 1424 KLW    Staphylococcus aureus (BCID) NOT DETECTED NOT DETECTED Final   Staphylococcus epidermidis DETECTED (A) NOT DETECTED Final    Comment: Methicillin (oxacillin) resistant coagulase  negative staphylococcus. Possible blood culture contaminant (unless isolated from more than one blood culture draw or clinical case suggests pathogenicity). No antibiotic treatment is indicated for blood  culture contaminants. CRITICAL RESULT CALLED TO, READ BACK BY AND VERIFIED WITH: EMILY STEINBOCK 09/23/24 1424 KLW    Staphylococcus lugdunensis NOT DETECTED NOT DETECTED Final   Streptococcus species NOT DETECTED NOT DETECTED Final   Streptococcus agalactiae NOT DETECTED NOT DETECTED Final   Streptococcus pneumoniae NOT DETECTED NOT DETECTED Final   Streptococcus pyogenes NOT DETECTED NOT DETECTED Final   A.calcoaceticus-baumannii NOT DETECTED NOT DETECTED Final   Bacteroides fragilis NOT DETECTED NOT DETECTED Final   Enterobacterales NOT DETECTED NOT DETECTED Final   Enterobacter cloacae complex NOT DETECTED NOT DETECTED Final   Escherichia coli NOT DETECTED NOT DETECTED Final   Klebsiella aerogenes NOT DETECTED NOT DETECTED Final   Klebsiella oxytoca NOT DETECTED NOT DETECTED Final   Klebsiella pneumoniae NOT DETECTED NOT DETECTED Final   Proteus species NOT DETECTED NOT DETECTED Final   Salmonella species NOT DETECTED NOT DETECTED Final   Serratia marcescens NOT DETECTED NOT DETECTED Final   Haemophilus influenzae NOT DETECTED NOT DETECTED Final   Neisseria meningitidis NOT DETECTED NOT DETECTED Final   Pseudomonas aeruginosa NOT DETECTED NOT DETECTED Final   Stenotrophomonas maltophilia NOT DETECTED NOT DETECTED Final   Candida albicans NOT DETECTED NOT DETECTED Final   Candida auris NOT DETECTED NOT DETECTED Final   Candida glabrata NOT DETECTED NOT DETECTED Final   Candida krusei NOT DETECTED NOT DETECTED Final   Candida parapsilosis NOT DETECTED NOT DETECTED Final   Candida tropicalis NOT DETECTED NOT DETECTED Final   Cryptococcus neoformans/gattii NOT DETECTED NOT DETECTED Final   Methicillin resistance mecA/C DETECTED (A) NOT DETECTED Final    Comment: CRITICAL RESULT  CALLED TO, READ BACK BY AND VERIFIED WITH: EMILY STEINBOCK 09/23/24 1424 KLW Performed at [facility], [address] [place], [place] [address]   Resp panel by RT-PCR (RSV, Flu A&B, Covid) Anterior Nasal Swab     Status: None   Collection Time: 09/22/24  5:58 PM   Specimen: Anterior Nasal Swab  Result Value Ref Range Status   SARS Coronavirus 2 by RT PCR NEGATIVE NEGATIVE Final    Comment: (NOTE) SARS-CoV-2 target nucleic acids are NOT DETECTED.  The SARS-CoV-2 RNA is generally detectable in upper respiratory specimens during the acute phase of infection. The lowest concentration of SARS-CoV-2 viral copies this assay can detect is 138 copies/mL. A negative result does not preclude SARS-Cov-2 infection and should not be used as the sole basis for treatment or other patient management decisions. A negative result may occur with  improper specimen collection/handling, submission of specimen other than nasopharyngeal swab, presence of viral mutation(s) within the areas targeted by this assay, and inadequate number of viral copies(<138 copies/mL). A negative result must be combined with clinical observations, patient history, and epidemiological information. The expected result is  Negative.  Fact Sheet for Patients:  bloggercourse.com  Fact Sheet for Healthcare Providers:  seriousbroker.it  This test is no t yet approved or cleared by the [place]  FDA and  has been authorized for detection and/or diagnosis of SARS-CoV-2 by FDA under an Emergency Use Authorization (EUA). This EUA will remain  in effect (meaning this test can be used) for the duration of the COVID-19 declaration under Section 564(b)(1) of the Act, 21 U.S.C.section 360bbb-3(b)(1), unless the authorization is terminated  or revoked sooner.       Influenza A by PCR NEGATIVE NEGATIVE Final   Influenza B by PCR NEGATIVE NEGATIVE Final    Comment:  (NOTE) The Xpert Xpress SARS-CoV-2/FLU/RSV plus assay is intended as an aid in the diagnosis of influenza from Nasopharyngeal swab specimens and should not be used as a sole basis for treatment. Nasal washings and aspirates are unacceptable for Xpert Xpress SARS-CoV-2/FLU/RSV testing.  Fact Sheet for Patients: bloggercourse.com  Fact Sheet for Healthcare Providers: seriousbroker.it  This test is not yet approved or cleared by the [place]  FDA and has been authorized for detection and/or diagnosis of SARS-CoV-2 by FDA under an Emergency Use Authorization (EUA). This EUA will remain in effect (meaning this test can be used) for the duration of the COVID-19 declaration under Section 564(b)(1) of the Act, 21 U.S.C. section 360bbb-3(b)(1), unless the authorization is terminated or revoked.     Resp Syncytial Virus by PCR NEGATIVE NEGATIVE Final    Comment: (NOTE) Fact Sheet for Patients: bloggercourse.com  Fact Sheet for Healthcare Providers: seriousbroker.it  This test is not yet approved or cleared by the [place]  FDA and has been authorized for detection and/or diagnosis of SARS-CoV-2 by FDA under an Emergency Use Authorization (EUA). This EUA will remain in effect (meaning this test can be used) for the duration of the COVID-19 declaration under Section 564(b)(1) of the Act, 21 U.S.C. section 360bbb-3(b)(1), unless the authorization is terminated or revoked.  Performed at [facility], [address] [place], [place] [address]   Culture, blood (Routine X 2) w Reflex to ID Panel     Status: None   Collection Time: 09/24/24  6:18 PM   Specimen: BLOOD  Result Value Ref Range Status   Specimen Description BLOOD BLOOD RIGHT HAND  Final   Special Requests   Final    BOTTLES DRAWN AEROBIC AND ANAEROBIC Blood Culture adequate volume   Culture   Final    NO  GROWTH 5 DAYS Performed at [facility], [address] [place], [place] [address]    Report Status 09/29/2024 FINAL  Final  MRSA Next Gen by PCR, Nasal     Status: Abnormal   Collection Time: 09/24/24  6:18 PM   Specimen: Nasal Mucosa; Nasal Swab  Result Value Ref Range Status   MRSA by PCR Next Gen DETECTED (A) NOT DETECTED Final    Comment: RESULT CALLED TO, READ BACK BY AND VERIFIED WITH:  GEORGINA OROZCO AT 2345 09/24/24 JG (NOTE) The GeneXpert MRSA Assay (FDA approved for NASAL specimens only), is one component of a comprehensive MRSA colonization surveillance program. It is not intended to diagnose MRSA infection nor to guide or monitor treatment for MRSA infections. Test performance is not FDA approved in patients less than 87 years old. Performed at [facility], [address] [place], [place] [address]   Culture, blood (Routine X 2) w Reflex to ID Panel     Status: None   Collection Time:  09/24/24  9:16 PM   Specimen: BLOOD  Result Value Ref Range Status   Specimen Description BLOOD BLOOD RIGHT HAND  Final   Special Requests   Final    BOTTLES DRAWN AEROBIC AND ANAEROBIC Blood Culture results may not be optimal due to an inadequate volume of blood received in culture bottles   Culture   Final    NO GROWTH 5 DAYS Performed at [facility], [address] [place], [place] [address]    Report Status 09/29/2024 FINAL  Final  Urine Culture (for pregnant, neutropenic or urologic patients or patients with an indwelling urinary catheter)     Status: None   Collection Time: 09/25/24 10:03 AM   Specimen: Urine, Clean Catch  Result Value Ref Range Status   Specimen Description   Final    URINE, CLEAN CATCH Performed at [facility], [address], [place], [place] [address]    Special Requests   Final    NONE Performed at [facility], [address] [place], [place] [address]    Culture   Final    NO  GROWTH Performed at [facility], [address] [place], [place] [address]    Report Status 09/26/2024 FINAL  Final  Gastrointestinal Panel by PCR , Stool     Status: None   Collection Time: 09/28/24  1:33 PM   Specimen: Stool  Result Value Ref Range Status   Campylobacter species NOT DETECTED NOT DETECTED Final   Plesimonas shigelloides NOT DETECTED NOT DETECTED Final   Salmonella species NOT DETECTED NOT DETECTED Final   Yersinia enterocolitica NOT DETECTED NOT DETECTED Final   Vibrio species NOT DETECTED NOT DETECTED Final   Vibrio cholerae NOT DETECTED NOT DETECTED Final   Enteroaggregative E coli (EAEC) NOT DETECTED NOT DETECTED Final   Enteropathogenic E coli (EPEC) NOT DETECTED NOT DETECTED Final   Enterotoxigenic E coli (ETEC) NOT DETECTED NOT DETECTED Final   Shiga like toxin producing E coli (STEC) NOT DETECTED NOT DETECTED Final   Shigella/Enteroinvasive E coli (EIEC) NOT DETECTED NOT DETECTED Final   Cryptosporidium NOT DETECTED NOT DETECTED Final   Cyclospora cayetanensis NOT DETECTED NOT DETECTED Final   Entamoeba histolytica NOT DETECTED NOT DETECTED Final   Giardia lamblia NOT DETECTED NOT DETECTED Final   Adenovirus F40/41 NOT DETECTED NOT DETECTED Final   Astrovirus NOT DETECTED NOT DETECTED Final   Norovirus GI/GII NOT DETECTED NOT DETECTED Final   Rotavirus A NOT DETECTED NOT DETECTED Final   Sapovirus (I, II, IV, and V) NOT DETECTED NOT DETECTED Final    Comment: Performed at [facility], [address], [place], [place] [address]     Radiology Studies: DG Abd 1 View Result Date: 09/29/2024 CLINICAL DATA:  NG tube placement EXAM: ABDOMEN - 1 VIEW COMPARISON:  09/26/2024 FINDINGS: Enteric tube tip and side port overlie the expected location of mid stomach. Pleural effusions and airspace disease at the bases. IMPRESSION: Enteric tube tip and side port overlie the expected location of the mid stomach. Electronically Signed   By: Luke Bun M.D.    On: 09/29/2024 22:29    Scheduled Meds:  ARIPiprazole   2 mg Per Tube QHS   artificial tears  1 drop Both Eyes QHS   vitamin C   500 mg Per Tube BID   aspirin   81 mg Per Tube Daily   clopidogrel   75 mg Per Tube Daily   [START ON 10/02/2024] vitamin B-12  1,000 mcg Per Tube  Daily   ergocalciferol  (VITAMIN D2)  50,000 Units Per Tube Weekly   feeding supplement (PROSource TF20)  60 mL Per Tube Daily   fiber supplement (BANATROL TF)  60 mL Per Tube BID   folic acid   1 mg Per Tube Daily   free water   300 mL Per Tube Q4H   guaiFENesin   10 mL Per Tube Q4H   heparin  injection (subcutaneous)  5,000 Units Subcutaneous Q8H   insulin  aspart  0-9 Units Subcutaneous Q4H   levETIRAcetam   500 mg Per Tube Q24H   Or   levETIRAcetam   500 mg Intravenous Q24H   multivitamin with minerals  1 tablet Per Tube Daily   omeprazole   20 mg Per Tube BID   mouth rinse  15 mL Mouth Rinse 4 times per day   senna-docusate  2 tablet Per Tube QHS   sertraline   175 mg Per Tube Daily   zinc  sulfate (50mg  elemental zinc )  220 mg Per Tube Daily   Continuous Infusions:  feeding supplement (OSMOLITE 1.5 CAL) 1,000 mL (10/01/24 0133)     LOS: 9 days  MDM: Patient is high risk for one or more organ failure.  They necessitate ongoing hospitalization for continued IV therapies and subsequent lab monitoring. Total time spent interpreting labs and vitals, reviewing the medical record, coordinating care amongst consultants and care team members, directly assessing and discussing care with the patient and/or family: 55 min Veda Arrellano, DO [facility]  To contact the attending physician between 7A-7P please use Epic Chat. To contact the covering physician during after hours 7P-7A, please review Amion.  10/01/2024, 9:41 AM   *This document has been created with the assistance of dictation software. Please excuse typographical errors. *   "

## 2024-10-01 NOTE — Plan of Care (Signed)

## 2024-10-01 NOTE — Progress Notes (Signed)
 Nutrition Follow-up  DOCUMENTATION CODES:   Non-severe (moderate) malnutrition in context of chronic illness  INTERVENTION:   -TF via NGT:    Continue Osmolite 1.5 @ 55 ml/hr   60 ml Prosource TF20 daily   300 ml free water  flush every 4 hours   Tube feeding regimen provides 2060 kcal (100% of needs), 103 grams of protein, and 1006 ml of H2O.  Total free water : 2986 ml daily   -D/c 60 ml Banatrol BID  -Nutrisource fiber BID -Continue 1 mg folic acid  daily -Continue MVI with minerals daily -Continue 100 mg thiamine  daily x 7 days -Continue 500 mg vitamin C  BID -Continue 220 mg zinc  sulfate daily x 14 days  -Monitor Mg, K, and Phos and replete as  needed secondary to high refeeding risk   NUTRITION DIAGNOSIS:   Moderate Malnutrition related to chronic illness (CHF, CVA) as evidenced by mild fat depletion, moderate fat depletion, mild muscle depletion, moderate muscle depletion.  Ongoing  GOAL:   Patient will meet greater than or equal to 90% of their needs  Met with TF  MONITOR:   TF tolerance, Labs, Weight trends, I & O's  REASON FOR ASSESSMENT:   Consult Enteral/tube feeding initiation and management  ASSESSMENT:   Pt with hx of PVD, CAD, CHF, gout, HLD, HTN, atrial fibrillation, prior CVA, hx ETOH abuse/tobacco abuse, CKD3a, and DM type 2 presented to ED from his facility with AMS and lethargy worsening over the last several days.Found to have a severe AKI and UTI.  1/1- NGT placed (KUB confirmed tip of tube and side port in stomach), TF initiated  1/6- s/p BSE- NPO   Reviewed I/O's: -255 ml x 24 hours and +9.6 L since admission  UOP: 3.4 L x 24 hours  Patient lying in bed at time of visit. No family at bedside. Patient somnolent at time of visit; he did not respond to voice or touch. Mental status has worsened since last visit.   Patient is NPO and receiving TF via NGT for sole source nutrition. Osmolite 1.5 infusing at 55 ml/hr.   Patient remains  with tyle 7 stools. Patient also with high K (on lokelma ). RD will transition to nutrisource fiber secondary to K levels.   Reviewed weight history. Weight has ranged from 77-94 kg since admission. Suspect weight of 94 kg on 09/30/24 is an outlier, as other weights have ranged from 80.6-89 kg over the past 5 days.   Medications reviewed and include vitamin C , vitamin D2, vitamin B-12, folic acid , keppra , senokot, lokelma , and zinc  sulfate.   Palliative care following for goals of care. NP reached out to family unsuccessfully. Per notes, family would like time for outcomes.   Case discussed with RN, SLP, MD, and palliative care. If goals of care remain aggressive, may need to consider permanent feeding access (ex PEG) if this aligns with patient's goals of care.   Labs reviewed: K: 5.3, CBGS: 108-142 (inpatient orders for glycemic control are 0-9 units insulin  aspart every 4 hours).    Diet Order:   Diet Order             Diet NPO time specified  Diet effective now                   EDUCATION NEEDS:   Not appropriate for education at this time  Skin:  Skin Assessment: Skin Integrity Issues: Skin Integrity Issues:: DTI, Stage I DTI: right ankle, left ankle Stage I: buttocks  Last BM:  10/01/24 (type 7)  Height:   Ht Readings from Last 1 Encounters:  09/22/24 5' 9 (1.753 m)    Weight:   Wt Readings from Last 1 Encounters:  10/01/24 89 kg    Ideal Body Weight:  72.7 kg  BMI:  Body mass index is 28.98 kg/m.  Estimated Nutritional Needs:   Kcal:  1900-2100 kcal/d  Protein:  90-110g/d  Fluid:  >2L/d    Margery ORN, RD, LDN, CDCES Registered Dietitian III Certified Diabetes Care and Education Specialist If unable to reach this RD, please use RD Inpatient group chat on secure chat between hours of 8am-4 pm daily

## 2024-10-01 NOTE — Progress Notes (Signed)
 [facility], [place] 10/01/2024  Subjective:   Hospital day # 9  Patient laying in bed Awake and alert Able to answer simple questions  Creatinine 3.39 Urine output Tube feeds 54ml/hr  Objective:  Vital signs in last 24 hours:  Temp:  [97.5 F (36.4 C)-98.3 F (36.8 C)] 97.7 F (36.5 C) (01/07 1149) Pulse Rate:  [67-73] 69 (01/07 1149) Resp:  [18-20] 18 (01/07 0400) BP: (130-158)/(71-79) 142/74 (01/07 1149) SpO2:  [93 %-98 %] 93 % (01/07 1149) Weight:  [89 kg] 89 kg (01/07 0600)  Weight change: -5 kg Filed Weights   09/29/24 0500 09/30/24 0629 10/01/24 0600  Weight: 82.6 kg 94 kg 89 kg    Intake/Output:    Intake/Output Summary (Last 24 hours) at 10/01/2024 1301 Last data filed at 10/01/2024 1231 Gross per 24 hour  Intake 3095 ml  Output 4350 ml  Net -1255 ml     Physical Exam: General: Chronically ill-appearing  HEENT NGT  Pulm/lungs Clear at bases, room air  CVS/Heart Regular rate and rhythm  Abdomen:  Soft, nontender, nondistended  Extremities: Trace dependent edema  Neurologic: Arouseable   Skin: Warm, dry   External urinary catheter in place       Basic Metabolic Panel:  Recent Labs  Lab 09/26/24 0320 09/27/24 0423 09/27/24 1954 09/28/24 0421 09/28/24 1558 09/29/24 0431 09/30/24 0352 10/01/24 0422  NA 145 146*   < > 152* 150* 148* 146* 145  K 3.9 3.1*   < > 3.2* 3.9 4.0 4.4 5.3*  CL 102 105   < > 109 110 110 109 107  CO2 20* 27   < > 28 27 27 26 25   GLUCOSE 108* 188*   < > 173* 160* 169* 140* 130*  BUN 102* 84*   < > 74* 70* 63* 60* 61*  CREATININE 5.04* 4.16*   < > 3.50* 3.22* 3.16* 3.26* 3.39*  CALCIUM  8.0* 8.1*   < > 7.9* 7.8* 8.3* 7.8* 8.1*  MG 2.3 2.2  --  2.0  --   --  1.8 1.9  PHOS 4.6 3.5  --  2.7  --  1.3* 2.5 4.2   < > = values in this interval not displayed.     CBC: Recent Labs  Lab 09/26/24 0320 09/27/24 0423 09/28/24 0421 09/29/24 0431 10/01/24 0422  WBC 5.9 5.6 6.5 8.1 9.1   HGB 9.8* 9.7* 9.7* 9.8* 8.9*  HCT 30.0* 29.7* 30.0* 30.5* 28.2*  MCV 92.6 91.4 91.7 92.7 95.6  PLT 122* 120* 125* 142* 223     No results found for: HEPBSAG, HEPBSAB, HEPBIGM    Microbiology:  Recent Results (from the past 240 hours)  Culture, blood (routine x 2)     Status: Abnormal   Collection Time: 09/22/24  5:37 PM   Specimen: BLOOD LEFT HAND  Result Value Ref Range Status   Specimen Description   Final    BLOOD LEFT HAND Performed at [facility], [address] [place], [place] [address]    Special Requests   Final    BOTTLES DRAWN AEROBIC AND ANAEROBIC Blood Culture results may not be optimal due to an inadequate volume of blood received in culture bottles Performed at [facility], [address] [place], [place] [address]    Culture  Setup Time   Final    GRAM POSITIVE COCCI AEROBIC BOTTLE ONLY CRITICAL VALUE NOTED.  VALUE IS CONSISTENT WITH PREVIOUSLY REPORTED AND CALLED VALUE. Performed at [facility], [address] [place], [place] [address]    Culture STAPHYLOCOCCUS EPIDERMIDIS (A)  Final   Report Status 09/25/2024 FINAL  Final   Organism ID, Bacteria STAPHYLOCOCCUS EPIDERMIDIS  Final      Susceptibility   Staphylococcus epidermidis - MIC*    CIPROFLOXACIN 4 RESISTANT Resistant     ERYTHROMYCIN >=8 RESISTANT Resistant     GENTAMICIN  >=16 RESISTANT Resistant     OXACILLIN >=4 RESISTANT Resistant     TETRACYCLINE <=1 SENSITIVE Sensitive     VANCOMYCIN  2 SENSITIVE Sensitive     TRIMETH/SULFA 160 RESISTANT Resistant     CLINDAMYCIN >=8 RESISTANT Resistant     RIFAMPIN <=0.5 SENSITIVE Sensitive     Inducible Clindamycin NEGATIVE Sensitive     * STAPHYLOCOCCUS EPIDERMIDIS  Culture, blood (routine x 2)     Status: Abnormal   Collection Time: 09/22/24  5:57 PM   Specimen: Right Antecubital; Blood  Result Value Ref Range Status   Specimen Description   Final    RIGHT ANTECUBITAL Performed at [facility],  [address] [place], [place] [address]    Special Requests   Final    BOTTLES DRAWN AEROBIC AND ANAEROBIC Blood Culture results may not be optimal due to an inadequate volume of blood received in culture bottles Performed at [facility], [address] [place], [place] [address]    Culture  Setup Time   Final    GRAM POSITIVE COCCI ANAEROBIC BOTTLE ONLY CRITICAL RESULT CALLED TO, READ BACK BY AND VERIFIED WITH: EMILY STEINBOCK 09/23/24 1424 KLW    Culture STAPHYLOCOCCUS EPIDERMIDIS (A)  Final   Report Status 09/25/2024 FINAL  Final   Organism ID, Bacteria STAPHYLOCOCCUS EPIDERMIDIS  Final      Susceptibility   Staphylococcus epidermidis - MIC*    CIPROFLOXACIN <=0.5 SENSITIVE Sensitive     ERYTHROMYCIN >=8 RESISTANT Resistant     GENTAMICIN  >=16 RESISTANT Resistant     OXACILLIN >=4 RESISTANT Resistant     TETRACYCLINE <=1 SENSITIVE Sensitive     VANCOMYCIN  1 SENSITIVE Sensitive     TRIMETH/SULFA 160 RESISTANT Resistant     CLINDAMYCIN <=0.25 SENSITIVE Sensitive     RIFAMPIN <=0.5 SENSITIVE Sensitive     Inducible Clindamycin NEGATIVE Sensitive     * STAPHYLOCOCCUS EPIDERMIDIS  Blood Culture ID Panel (Reflexed)     Status: Abnormal   Collection Time: 09/22/24  5:57 PM  Result Value Ref Range Status   Enterococcus faecalis NOT DETECTED NOT DETECTED Final   Enterococcus Faecium NOT DETECTED NOT DETECTED Final   Listeria monocytogenes NOT DETECTED NOT DETECTED Final   Staphylococcus species DETECTED (A) NOT DETECTED Final    Comment: CRITICAL RESULT CALLED TO, READ BACK BY AND VERIFIED WITH: EMILY STEINBOCK 09/23/24 1424 KLW    Staphylococcus aureus (BCID) NOT DETECTED NOT DETECTED Final   Staphylococcus epidermidis DETECTED (A) NOT DETECTED Final    Comment: Methicillin (oxacillin) resistant coagulase negative staphylococcus. Possible blood culture contaminant (unless isolated from more than one blood culture draw or clinical case suggests pathogenicity). No  antibiotic treatment is indicated for blood  culture contaminants. CRITICAL RESULT CALLED TO, READ BACK BY AND VERIFIED WITH: EMILY STEINBOCK 09/23/24 1424 KLW    Staphylococcus lugdunensis NOT DETECTED NOT DETECTED Final   Streptococcus species NOT DETECTED NOT DETECTED Final   Streptococcus agalactiae NOT DETECTED NOT DETECTED Final   Streptococcus pneumoniae NOT DETECTED NOT DETECTED Final   Streptococcus pyogenes NOT DETECTED NOT DETECTED Final   A.calcoaceticus-baumannii NOT DETECTED NOT  DETECTED Final   Bacteroides fragilis NOT DETECTED NOT DETECTED Final   Enterobacterales NOT DETECTED NOT DETECTED Final   Enterobacter cloacae complex NOT DETECTED NOT DETECTED Final   Escherichia coli NOT DETECTED NOT DETECTED Final   Klebsiella aerogenes NOT DETECTED NOT DETECTED Final   Klebsiella oxytoca NOT DETECTED NOT DETECTED Final   Klebsiella pneumoniae NOT DETECTED NOT DETECTED Final   Proteus species NOT DETECTED NOT DETECTED Final   Salmonella species NOT DETECTED NOT DETECTED Final   Serratia marcescens NOT DETECTED NOT DETECTED Final   Haemophilus influenzae NOT DETECTED NOT DETECTED Final   Neisseria meningitidis NOT DETECTED NOT DETECTED Final   Pseudomonas aeruginosa NOT DETECTED NOT DETECTED Final   Stenotrophomonas maltophilia NOT DETECTED NOT DETECTED Final   Candida albicans NOT DETECTED NOT DETECTED Final   Candida auris NOT DETECTED NOT DETECTED Final   Candida glabrata NOT DETECTED NOT DETECTED Final   Candida krusei NOT DETECTED NOT DETECTED Final   Candida parapsilosis NOT DETECTED NOT DETECTED Final   Candida tropicalis NOT DETECTED NOT DETECTED Final   Cryptococcus neoformans/gattii NOT DETECTED NOT DETECTED Final   Methicillin resistance mecA/C DETECTED (A) NOT DETECTED Final    Comment: CRITICAL RESULT CALLED TO, READ BACK BY AND VERIFIED WITH: EMILY STEINBOCK 09/23/24 1424 KLW Performed at [facility], [address] [place], [place] [address]    Resp panel by RT-PCR (RSV, Flu A&B, Covid) Anterior Nasal Swab     Status: None   Collection Time: 09/22/24  5:58 PM   Specimen: Anterior Nasal Swab  Result Value Ref Range Status   SARS Coronavirus 2 by RT PCR NEGATIVE NEGATIVE Final    Comment: (NOTE) SARS-CoV-2 target nucleic acids are NOT DETECTED.  The SARS-CoV-2 RNA is generally detectable in upper respiratory specimens during the acute phase of infection. The lowest concentration of SARS-CoV-2 viral copies this assay can detect is 138 copies/mL. A negative result does not preclude SARS-Cov-2 infection and should not be used as the sole basis for treatment or other patient management decisions. A negative result may occur with  improper specimen collection/handling, submission of specimen other than nasopharyngeal swab, presence of viral mutation(s) within the areas targeted by this assay, and inadequate number of viral copies(<138 copies/mL). A negative result must be combined with clinical observations, patient history, and epidemiological information. The expected result is Negative.  Fact Sheet for Patients:  bloggercourse.com  Fact Sheet for Healthcare Providers:  seriousbroker.it  This test is no t yet approved or cleared by the [place]  FDA and  has been authorized for detection and/or diagnosis of SARS-CoV-2 by FDA under an Emergency Use Authorization (EUA). This EUA will remain  in effect (meaning this test can be used) for the duration of the COVID-19 declaration under Section 564(b)(1) of the Act, 21 U.S.C.section 360bbb-3(b)(1), unless the authorization is terminated  or revoked sooner.       Influenza A by PCR NEGATIVE NEGATIVE Final   Influenza B by PCR NEGATIVE NEGATIVE Final    Comment: (NOTE) The Xpert Xpress SARS-CoV-2/FLU/RSV plus assay is intended as an aid in the diagnosis of influenza from Nasopharyngeal swab specimens and should not be used  as a sole basis for treatment. Nasal washings and aspirates are unacceptable for Xpert Xpress SARS-CoV-2/FLU/RSV testing.  Fact Sheet for Patients: bloggercourse.com  Fact Sheet for Healthcare Providers: seriousbroker.it  This test is not yet approved or cleared by the [place]  FDA and has been authorized for detection and/or diagnosis of SARS-CoV-2 by FDA  under an Emergency Use Authorization (EUA). This EUA will remain in effect (meaning this test can be used) for the duration of the COVID-19 declaration under Section 564(b)(1) of the Act, 21 U.S.C. section 360bbb-3(b)(1), unless the authorization is terminated or revoked.     Resp Syncytial Virus by PCR NEGATIVE NEGATIVE Final    Comment: (NOTE) Fact Sheet for Patients: bloggercourse.com  Fact Sheet for Healthcare Providers: seriousbroker.it  This test is not yet approved or cleared by the [place]  FDA and has been authorized for detection and/or diagnosis of SARS-CoV-2 by FDA under an Emergency Use Authorization (EUA). This EUA will remain in effect (meaning this test can be used) for the duration of the COVID-19 declaration under Section 564(b)(1) of the Act, 21 U.S.C. section 360bbb-3(b)(1), unless the authorization is terminated or revoked.  Performed at [facility], [address] [place], [place] [address]   Culture, blood (Routine X 2) w Reflex to ID Panel     Status: None   Collection Time: 09/24/24  6:18 PM   Specimen: BLOOD  Result Value Ref Range Status   Specimen Description BLOOD BLOOD RIGHT HAND  Final   Special Requests   Final    BOTTLES DRAWN AEROBIC AND ANAEROBIC Blood Culture adequate volume   Culture   Final    NO GROWTH 5 DAYS Performed at [facility], [address], [place], [place] [address]    Report Status 09/29/2024 FINAL  Final  MRSA Next Gen by PCR, Nasal      Status: Abnormal   Collection Time: 09/24/24  6:18 PM   Specimen: Nasal Mucosa; Nasal Swab  Result Value Ref Range Status   MRSA by PCR Next Gen DETECTED (A) NOT DETECTED Final    Comment: RESULT CALLED TO, READ BACK BY AND VERIFIED WITH:  GEORGINA OROZCO AT 2345 09/24/24 JG (NOTE) The GeneXpert MRSA Assay (FDA approved for NASAL specimens only), is one component of a comprehensive MRSA colonization surveillance program. It is not intended to diagnose MRSA infection nor to guide or monitor treatment for MRSA infections. Test performance is not FDA approved in patients less than 37 years old. Performed at [facility], [address] [place], [place] [address]   Culture, blood (Routine X 2) w Reflex to ID Panel     Status: None   Collection Time: 09/24/24  9:16 PM   Specimen: BLOOD  Result Value Ref Range Status   Specimen Description BLOOD BLOOD RIGHT HAND  Final   Special Requests   Final    BOTTLES DRAWN AEROBIC AND ANAEROBIC Blood Culture results may not be optimal due to an inadequate volume of blood received in culture bottles   Culture   Final    NO GROWTH 5 DAYS Performed at [facility], [address] [place], [place] [address]    Report Status 09/29/2024 FINAL  Final  Urine Culture (for pregnant, neutropenic or urologic patients or patients with an indwelling urinary catheter)     Status: None   Collection Time: 09/25/24 10:03 AM   Specimen: Urine, Clean Catch  Result Value Ref Range Status   Specimen Description   Final    URINE, CLEAN CATCH Performed at [facility], [address], [place], [place] [address]    Special Requests   Final    NONE Performed at [facility], [address] [place], [place] [address]    Culture   Final    NO GROWTH Performed at [facility], [address], [place], [place] [address]    Report Status 09/26/2024 FINAL  Final  Gastrointestinal Panel by PCR , Stool     Status: None    Collection Time: 09/28/24  1:33 PM   Specimen: Stool  Result Value Ref Range Status   Campylobacter species NOT DETECTED NOT DETECTED Final   Plesimonas shigelloides NOT DETECTED NOT DETECTED Final   Salmonella species NOT DETECTED NOT DETECTED Final   Yersinia enterocolitica NOT DETECTED NOT DETECTED Final   Vibrio species NOT DETECTED NOT DETECTED Final   Vibrio cholerae NOT DETECTED NOT DETECTED Final   Enteroaggregative E coli (EAEC) NOT DETECTED NOT DETECTED Final   Enteropathogenic E coli (EPEC) NOT DETECTED NOT DETECTED Final   Enterotoxigenic E coli (ETEC) NOT DETECTED NOT DETECTED Final   Shiga like toxin producing E coli (STEC) NOT DETECTED NOT DETECTED Final   Shigella/Enteroinvasive E coli (EIEC) NOT DETECTED NOT DETECTED Final   Cryptosporidium NOT DETECTED NOT DETECTED Final   Cyclospora cayetanensis NOT DETECTED NOT DETECTED Final   Entamoeba histolytica NOT DETECTED NOT DETECTED Final   Giardia lamblia NOT DETECTED NOT DETECTED Final   Adenovirus F40/41 NOT DETECTED NOT DETECTED Final   Astrovirus NOT DETECTED NOT DETECTED Final   Norovirus GI/GII NOT DETECTED NOT DETECTED Final   Rotavirus A NOT DETECTED NOT DETECTED Final   Sapovirus (I, II, IV, and V) NOT DETECTED NOT DETECTED Final    Comment: Performed at [facility], [address] [place], [place] [address]    Coagulation Studies: No results for input(s): LABPROT, INR in the last 72 hours.  Urinalysis: No results for input(s): COLORURINE, LABSPEC, PHURINE, GLUCOSEU, HGBUR, BILIRUBINUR, KETONESUR, PROTEINUR, UROBILINOGEN, NITRITE, LEUKOCYTESUR in the last 72 hours.  Invalid input(s): APPERANCEUR     Imaging: DG Abd 1 View Result Date: 09/29/2024 CLINICAL DATA:  NG tube placement EXAM: ABDOMEN - 1 VIEW COMPARISON:  09/26/2024 FINDINGS: Enteric tube tip and side port overlie the expected location of mid stomach. Pleural effusions and airspace disease at the bases.  IMPRESSION: Enteric tube tip and side port overlie the expected location of the mid stomach. Electronically Signed   By: Luke Bun M.D.   On: 09/29/2024 22:29      Medications:    feeding supplement (OSMOLITE 1.5 CAL) 1,000 mL (10/01/24 1049)    ARIPiprazole   2 mg Per Tube QHS   artificial tears  1 drop Both Eyes QHS   vitamin C   500 mg Per Tube BID   aspirin   81 mg Per Tube Daily   clopidogrel   75 mg Per Tube Daily   [START ON 10/02/2024] vitamin B-12  1,000 mcg Per Tube Daily   ergocalciferol  (VITAMIN D2)  50,000 Units Per Tube Weekly   feeding supplement (PROSource TF20)  60 mL Per Tube Daily   fiber  1 packet Per Tube BID   folic acid   1 mg Per Tube Daily   free water   300 mL Per Tube Q4H   guaiFENesin   10 mL Per Tube Q4H   heparin  injection (subcutaneous)  5,000 Units Subcutaneous Q8H   insulin  aspart  0-9 Units Subcutaneous Q4H   levETIRAcetam   500 mg Per Tube Q24H   Or   levETIRAcetam   500 mg Intravenous Q24H   multivitamin with minerals  1 tablet Per Tube Daily   omeprazole   20 mg Per Tube BID   mouth rinse  15 mL Mouth Rinse 4 times per day   senna-docusate  2 tablet Per  Tube QHS   sertraline   175 mg Per Tube Daily   zinc  sulfate (50mg  elemental zinc )  220 mg Per Tube Daily   acetaminophen  **OR** acetaminophen , camphor-menthol , ipratropium-albuterol , lactulose , loperamide  HCl, ondansetron  **OR** ondansetron  (ZOFRAN ) IV, mouth rinse  Assessment/ Plan:  74 y.o. male with with multiple medical problems including history of stroke, peripheral vascular disease, hypertension, type 2 diabetes admitted on 09/22/2024 for Acute metabolic encephalopathy [G93.41]   Acute kidney injury Chronic kidney disease, stage IIIa.  Creatinine 1.54/GFR 49 from 08/04/2022 Diabetes type 2 with chronic kidney disease History of chronic left hydronephrosis  history of kidney stones and left ureteral stent placement and removal in February 2024 Acute metabolic encephalopathy Acute  metabolic acidosis   Patient has severe acute kidney injury.  He has history of kidney stones, left hydronephrosis and left ureteral stent placement.  Renal ultrasound from 09/24/2024 is negative for hydronephrosis.  Nonobstructing calculi in both kidneys Creatinine appears to have stabilized.  Currently receiving tube feeds.  Potassium starting to rise, 5.3.  Will continue to monitor and give Lokelma  as needed.  Hemoglobin A1c 5.6% from 09/22/2024.   Glucose well controlled. Management as as per primary team.  Urinary tract infection-completed ertapenem     LOS: 9 [facility] 1/7/20261:01 PM  [facility] [place], [place] [phone]

## 2024-10-02 ENCOUNTER — Inpatient Hospital Stay

## 2024-10-02 DIAGNOSIS — Z7189 Other specified counseling: Secondary | ICD-10-CM | POA: Diagnosis not present

## 2024-10-02 DIAGNOSIS — G9341 Metabolic encephalopathy: Secondary | ICD-10-CM | POA: Diagnosis not present

## 2024-10-02 LAB — GLUCOSE, CAPILLARY
Glucose-Capillary: 130 mg/dL — ABNORMAL HIGH (ref 70–99)
Glucose-Capillary: 133 mg/dL — ABNORMAL HIGH (ref 70–99)
Glucose-Capillary: 95 mg/dL (ref 70–99)
Glucose-Capillary: 145 mg/dL — ABNORMAL HIGH (ref 70–99)
Glucose-Capillary: 104 mg/dL — ABNORMAL HIGH (ref 70–99)

## 2024-10-02 MED ORDER — GLYCOPYRROLATE 0.2 MG/ML IJ SOLN: 0.2000 mg | INTRAMUSCULAR | Status: DC | PRN

## 2024-10-02 MED ORDER — HALOPERIDOL LACTATE 5 MG/ML IJ SOLN: 2.5000 mg | INTRAMUSCULAR | Status: DC | PRN

## 2024-10-02 MED ORDER — SODIUM CHLORIDE 0.9 % IV SOLN: INTRAVENOUS | Status: DC

## 2024-10-02 MED ORDER — LORAZEPAM 2 MG/ML IJ SOLN: 2.0000 mg | INTRAMUSCULAR | Status: DC | PRN

## 2024-10-02 MED ORDER — GLYCOPYRROLATE 1 MG PO TABS: 1.0000 mg | ORAL_TABLET | ORAL | Status: DC | PRN

## 2024-10-02 MED ORDER — POLYVINYL ALCOHOL 1.4 % OP SOLN: 1.0000 [drp] | Freq: Four times a day (QID) | OPHTHALMIC | Status: DC | PRN

## 2024-10-02 NOTE — Plan of Care (Signed)
 Patient is currently resting in bed at this time.  He has mittens and an NG tube in place.  His eyes are open but he does not attempt to speak.  Family meeting scheduled for today at 1:00 did not occur as provider was unavailable at the scheduled time. Nursing updated sister at bedside, and upon arrival at 1:20, she had already departed the room and halls near room. Attempted to call her x2 to discuss medical updates and reschedule, and call went immediately to VM. Team including attending updated. Attending attempting to reach sister to discuss care and plans moving forward.

## 2024-10-02 NOTE — [place] FL2 (Signed)
 " [place]  MEDICAID FL2 LEVEL OF CARE FORM     IDENTIFICATION  Patient Name: Rodney Estrada Birthdate: Mar 12, 1951 Sex: male Admission Date (Current Location): 09/22/2024  [place] and [place] Number:  Chiropodist and Address:  [facility], [address], [place], [place] [address]      Provider Number: [id number]  Attending Physician Name and Address:  Leesa Kast, DO  Relative Name and Phone Number:  Rhoda Ing- sister- [phone]    Current Level of Care: Hospital Recommended Level of Care: Skilled Nursing Facility (with hospice through [facility]) Prior Approval Number:    Date Approved/Denied:   PASRR Number:    Discharge Plan: SNF (hospice with [facility])    Current Diagnoses: Patient Active Problem List   Diagnosis Date Noted   Urinary tract infection without hematuria 09/29/2024   Malnutrition of moderate degree 09/26/2024   Pyelonephritis 09/26/2024   Uremic encephalopathy 09/26/2024   Elevated troponin 09/23/2024   Acute metabolic encephalopathy 09/22/2024   Acute lower UTI 09/22/2024   Acute kidney injury superimposed on chronic kidney disease 09/22/2024   Type 2 diabetes mellitus with peripheral neuropathy (HCC) 09/22/2024   Seizure disorder (HCC) 09/22/2024   GERD without esophagitis 09/22/2024   Depression 09/22/2024   Gout 09/22/2024   Dyslipidemia 09/22/2024   Hyperkalemia 09/22/2024   Coronary artery disease 09/22/2024   Hypernatremia 07/31/2022   Hiccups 07/28/2022   Hypokalemia 07/27/2022   SBO (small bowel obstruction) (HCC) 07/23/2022   Nephrolithiasis 07/23/2022   Partial intestinal obstruction (HCC)    Cerebral edema (HCC) 04/16/2020   Hypertensive emergency 04/16/2020   Essential hypertension 04/16/2020   Hyperlipidemia LDL goal <70 04/16/2020   Type 2 diabetes mellitus (HCC) 04/16/2020   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 04/16/2020   AKI (acute kidney injury) (HCC) in the setting of stage  IIIa chronic kidney disease 04/16/2020   Tobacco use disorder 04/16/2020   Alcohol  abuse 04/16/2020   Cocaine abuse (HCC) 04/16/2020   Obesity 04/16/2020   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 04/05/2020    Orientation RESPIRATION BLADDER Height & Weight     Self  O2 (chronic) Incontinent Weight: 89 kg Height:  5' 9 (175.3 cm)  BEHAVIORAL SYMPTOMS/MOOD NEUROLOGICAL BOWEL NUTRITION STATUS      Incontinent Diet (Carb modified)  AMBULATORY STATUS COMMUNICATION OF NEEDS Skin   Total Care Verbally Bruising                       Personal Care Assistance Level of Assistance  Total care       Total Care Assistance: Maximum assistance   Functional Limitations Info             SPECIAL CARE FACTORS FREQUENCY                       Contractures Contractures Info: Present (lef hand, both legs)    Additional Factors Info  Code Status, Allergies Code Status Info: DNR Allergies Info: NKA           Current Medications (10/02/2024):  This is the current hospital active medication list Current Facility-Administered Medications  Medication Dose Route Frequency Provider Last Rate Last Admin   0.9 %  sodium chloride  infusion   Intravenous Continuous Dezii, Alexandra, DO       acetaminophen  (TYLENOL ) tablet 650 mg  650 mg Per Tube Q6H PRN Von Bellis, MD  Or   acetaminophen  (TYLENOL ) suppository 650 mg  650 mg Rectal Q6H PRN Von Bellis, MD       ARIPiprazole  (ABILIFY ) tablet 2 mg  2 mg Per Tube QHS Von Bellis, MD   2 mg at 10/01/24 2233   artificial tears ophthalmic solution 1 drop  1 drop Both Eyes QHS Mansy, Jan A, MD   1 drop at 10/01/24 2233   artificial tears ophthalmic solution 1 drop  1 drop Both Eyes QID PRN Dezii, Alexandra, DO       aspirin  chewable tablet 81 mg  81 mg Per Tube Daily Von Bellis, MD   81 mg at 10/02/24 1003   camphor-menthol  (SARNA) lotion 1 Application  1 Application Topical PRN  Mansy, Jan A, MD       clopidogrel  (PLAVIX ) tablet 75 mg  75 mg Per Tube Daily Von Bellis, MD   75 mg at 10/02/24 1009   glycopyrrolate  (ROBINUL ) tablet 1 mg  1 mg Oral Q4H PRN Dezii, Alexandra, DO       Or   glycopyrrolate  (ROBINUL ) injection 0.2 mg  0.2 mg Subcutaneous Q4H PRN Dezii, Alexandra, DO       Or   glycopyrrolate  (ROBINUL ) injection 0.2 mg  0.2 mg Intravenous Q4H PRN Dezii, Alexandra, DO       guaiFENesin  (ROBITUSSIN) 100 MG/5ML liquid 10 mL  10 mL Per Tube Q4H Von Bellis, MD   10 mL at 10/02/24 1048   haloperidol  lactate (HALDOL ) injection 2.5-5 mg  2.5-5 mg Intravenous Q4H PRN Dezii, Alexandra, DO       insulin  aspart (novoLOG ) injection 0-9 Units  0-9 Units Subcutaneous Q4H Von Bellis, MD   1 Units at 10/02/24 1011   ipratropium-albuterol  (DUONEB) 0.5-2.5 (3) MG/3ML nebulizer solution 3 mL  3 mL Inhalation Q4H PRN Mansy, Jan A, MD       lactulose  (CHRONULAC ) 10 GM/15ML solution 20 g  20 g Per Tube BID PRN Von Bellis, MD       levETIRAcetam  (KEPPRA ) tablet 500 mg  500 mg Per Tube Q24H Chappell, Alex B, RPH   500 mg at 10/02/24 1005   Or   levETIRAcetam  (KEPPRA ) undiluted injection 500 mg  500 mg Intravenous Q24H Chappell, Alex B, RPH       loperamide  HCl (IMODIUM ) 1 MG/7.5ML suspension 2 mg  2 mg Per Tube TID PRN Von Bellis, MD       LORazepam  (ATIVAN ) injection 2-4 mg  2-4 mg Intravenous Q4H PRN Dezii, Alexandra, DO       omeprazole  (KONVOMEP ) 2 mg/mL oral suspension 20 mg  20 mg Per Tube BID Von Bellis, MD   20 mg at 10/02/24 1024   ondansetron  (ZOFRAN ) tablet 4 mg  4 mg Per Tube Q6H PRN Von Bellis, MD       Or   ondansetron  (ZOFRAN ) injection 4 mg  4 mg Intravenous Q6H PRN Von Bellis, MD       Oral care mouth rinse  15 mL Mouth Rinse 4 times per day Von Bellis, MD   15 mL at 10/02/24 1150   Oral care mouth rinse  15 mL Mouth Rinse PRN Von Bellis, MD       senna-docusate (Senokot-S) tablet 2 tablet  2 tablet Per Tube QHS Von Bellis, MD   2  tablet at 09/27/24 2150   sertraline  (ZOLOFT ) tablet 175 mg  175 mg Per Tube Daily Von Bellis, MD   175 mg at 10/02/24 1005   zinc  sulfate (50mg  elemental  zinc ) capsule 220 mg  220 mg Per Tube Daily Von Bellis, MD   220 mg at 10/02/24 1005     Discharge Medications: Please see discharge summary for a list of discharge medications.  Relevant Imaging Results:  Relevant Lab Results:   Additional Information SSN: [id number]  Shasta DELENA Daring, RN     "

## 2024-10-02 NOTE — Progress Notes (Signed)
 [facility], [place] 10/02/2024  Subjective:   Hospital day # 10  Patient laying in bed Eyes open to name No verbal response today  Creatinine 3.39, no new labs Urine output Tube feeds 65ml/hr  Objective:  Vital signs in last 24 hours:  Temp:  [97.7 F (36.5 C)-98.8 F (37.1 C)] 97.7 F (36.5 C) (01/08 1143) Pulse Rate:  [60-67] 60 (01/08 1143) Resp:  [15-22] 17 (01/08 1143) BP: (137-153)/(68-92) 151/73 (01/08 1143) SpO2:  [93 %-97 %] 94 % (01/08 1143) Weight:  [89 kg] 89 kg (01/08 0505)  Weight change: 0 kg Filed Weights   09/30/24 0629 10/01/24 0600 10/02/24 0505  Weight: 94 kg 89 kg 89 kg    Intake/Output:    Intake/Output Summary (Last 24 hours) at 10/02/2024 1433 Last data filed at 10/02/2024 1149 Gross per 24 hour  Intake 3375 ml  Output 2650 ml  Net 725 ml     Physical Exam: General: Chronically ill-appearing  HEENT NGT  Pulm/lungs Clear at bases, room air  CVS/Heart Regular rate and rhythm  Abdomen:  Soft, nontender, nondistended  Extremities: Trace dependent edema  Neurologic: Arouseable   Skin: Warm, dry   External urinary catheter in place       Basic Metabolic Panel:  Recent Labs  Lab 09/26/24 0320 09/27/24 0423 09/27/24 1954 09/28/24 0421 09/28/24 1558 09/29/24 0431 09/30/24 0352 10/01/24 0422  NA 145 146*   < > 152* 150* 148* 146* 145  K 3.9 3.1*   < > 3.2* 3.9 4.0 4.4 5.3*  CL 102 105   < > 109 110 110 109 107  CO2 20* 27   < > 28 27 27 26 25   GLUCOSE 108* 188*   < > 173* 160* 169* 140* 130*  BUN 102* 84*   < > 74* 70* 63* 60* 61*  CREATININE 5.04* 4.16*   < > 3.50* 3.22* 3.16* 3.26* 3.39*  CALCIUM  8.0* 8.1*   < > 7.9* 7.8* 8.3* 7.8* 8.1*  MG 2.3 2.2  --  2.0  --   --  1.8 1.9  PHOS 4.6 3.5  --  2.7  --  1.3* 2.5 4.2   < > = values in this interval not displayed.     CBC: Recent Labs  Lab 09/26/24 0320 09/27/24 0423 09/28/24 0421 09/29/24 0431 10/01/24 0422  WBC 5.9 5.6 6.5 8.1 9.1   HGB 9.8* 9.7* 9.7* 9.8* 8.9*  HCT 30.0* 29.7* 30.0* 30.5* 28.2*  MCV 92.6 91.4 91.7 92.7 95.6  PLT 122* 120* 125* 142* 223     No results found for: HEPBSAG, HEPBSAB, HEPBIGM    Microbiology:  Recent Results (from the past 240 hours)  Culture, blood (routine x 2)     Status: Abnormal   Collection Time: 09/22/24  5:37 PM   Specimen: BLOOD LEFT HAND  Result Value Ref Range Status   Specimen Description   Final    BLOOD LEFT HAND Performed at [facility], [address] [place], [place] [address]    Special Requests   Final    BOTTLES DRAWN AEROBIC AND ANAEROBIC Blood Culture results may not be optimal due to an inadequate volume of blood received in culture bottles Performed at [facility], [address], [place], [place] [address]    Culture  Setup Time   Final    GRAM POSITIVE COCCI AEROBIC BOTTLE ONLY CRITICAL VALUE NOTED.  VALUE IS CONSISTENT WITH PREVIOUSLY REPORTED AND CALLED VALUE. Performed  at [facility], [address] [place], [place] [address]    Culture STAPHYLOCOCCUS EPIDERMIDIS (A)  Final   Report Status 09/25/2024 FINAL  Final   Organism ID, Bacteria STAPHYLOCOCCUS EPIDERMIDIS  Final      Susceptibility   Staphylococcus epidermidis - MIC*    CIPROFLOXACIN 4 RESISTANT Resistant     ERYTHROMYCIN >=8 RESISTANT Resistant     GENTAMICIN  >=16 RESISTANT Resistant     OXACILLIN >=4 RESISTANT Resistant     TETRACYCLINE <=1 SENSITIVE Sensitive     VANCOMYCIN  2 SENSITIVE Sensitive     TRIMETH/SULFA 160 RESISTANT Resistant     CLINDAMYCIN >=8 RESISTANT Resistant     RIFAMPIN <=0.5 SENSITIVE Sensitive     Inducible Clindamycin NEGATIVE Sensitive     * STAPHYLOCOCCUS EPIDERMIDIS  Culture, blood (routine x 2)     Status: Abnormal   Collection Time: 09/22/24  5:57 PM   Specimen: Right Antecubital; Blood  Result Value Ref Range Status   Specimen Description   Final    RIGHT ANTECUBITAL Performed at [facility],  [address] [place], [place] [address]    Special Requests   Final    BOTTLES DRAWN AEROBIC AND ANAEROBIC Blood Culture results may not be optimal due to an inadequate volume of blood received in culture bottles Performed at [facility], [address] [place], [place] [address]    Culture  Setup Time   Final    GRAM POSITIVE COCCI ANAEROBIC BOTTLE ONLY CRITICAL RESULT CALLED TO, READ BACK BY AND VERIFIED WITH: EMILY STEINBOCK 09/23/24 1424 KLW    Culture STAPHYLOCOCCUS EPIDERMIDIS (A)  Final   Report Status 09/25/2024 FINAL  Final   Organism ID, Bacteria STAPHYLOCOCCUS EPIDERMIDIS  Final      Susceptibility   Staphylococcus epidermidis - MIC*    CIPROFLOXACIN <=0.5 SENSITIVE Sensitive     ERYTHROMYCIN >=8 RESISTANT Resistant     GENTAMICIN  >=16 RESISTANT Resistant     OXACILLIN >=4 RESISTANT Resistant     TETRACYCLINE <=1 SENSITIVE Sensitive     VANCOMYCIN  1 SENSITIVE Sensitive     TRIMETH/SULFA 160 RESISTANT Resistant     CLINDAMYCIN <=0.25 SENSITIVE Sensitive     RIFAMPIN <=0.5 SENSITIVE Sensitive     Inducible Clindamycin NEGATIVE Sensitive     * STAPHYLOCOCCUS EPIDERMIDIS  Blood Culture ID Panel (Reflexed)     Status: Abnormal   Collection Time: 09/22/24  5:57 PM  Result Value Ref Range Status   Enterococcus faecalis NOT DETECTED NOT DETECTED Final   Enterococcus Faecium NOT DETECTED NOT DETECTED Final   Listeria monocytogenes NOT DETECTED NOT DETECTED Final   Staphylococcus species DETECTED (A) NOT DETECTED Final    Comment: CRITICAL RESULT CALLED TO, READ BACK BY AND VERIFIED WITH: EMILY STEINBOCK 09/23/24 1424 KLW    Staphylococcus aureus (BCID) NOT DETECTED NOT DETECTED Final   Staphylococcus epidermidis DETECTED (A) NOT DETECTED Final    Comment: Methicillin (oxacillin) resistant coagulase negative staphylococcus. Possible blood culture contaminant (unless isolated from more than one blood culture draw or clinical case suggests pathogenicity). No  antibiotic treatment is indicated for blood  culture contaminants. CRITICAL RESULT CALLED TO, READ BACK BY AND VERIFIED WITH: EMILY STEINBOCK 09/23/24 1424 KLW    Staphylococcus lugdunensis NOT DETECTED NOT DETECTED Final   Streptococcus species NOT DETECTED NOT DETECTED Final   Streptococcus agalactiae NOT DETECTED NOT DETECTED Final   Streptococcus pneumoniae NOT DETECTED NOT DETECTED Final   Streptococcus pyogenes NOT DETECTED NOT DETECTED Final   A.calcoaceticus-baumannii  NOT DETECTED NOT DETECTED Final   Bacteroides fragilis NOT DETECTED NOT DETECTED Final   Enterobacterales NOT DETECTED NOT DETECTED Final   Enterobacter cloacae complex NOT DETECTED NOT DETECTED Final   Escherichia coli NOT DETECTED NOT DETECTED Final   Klebsiella aerogenes NOT DETECTED NOT DETECTED Final   Klebsiella oxytoca NOT DETECTED NOT DETECTED Final   Klebsiella pneumoniae NOT DETECTED NOT DETECTED Final   Proteus species NOT DETECTED NOT DETECTED Final   Salmonella species NOT DETECTED NOT DETECTED Final   Serratia marcescens NOT DETECTED NOT DETECTED Final   Haemophilus influenzae NOT DETECTED NOT DETECTED Final   Neisseria meningitidis NOT DETECTED NOT DETECTED Final   Pseudomonas aeruginosa NOT DETECTED NOT DETECTED Final   Stenotrophomonas maltophilia NOT DETECTED NOT DETECTED Final   Candida albicans NOT DETECTED NOT DETECTED Final   Candida auris NOT DETECTED NOT DETECTED Final   Candida glabrata NOT DETECTED NOT DETECTED Final   Candida krusei NOT DETECTED NOT DETECTED Final   Candida parapsilosis NOT DETECTED NOT DETECTED Final   Candida tropicalis NOT DETECTED NOT DETECTED Final   Cryptococcus neoformans/gattii NOT DETECTED NOT DETECTED Final   Methicillin resistance mecA/C DETECTED (A) NOT DETECTED Final    Comment: CRITICAL RESULT CALLED TO, READ BACK BY AND VERIFIED WITH: EMILY STEINBOCK 09/23/24 1424 KLW Performed at [facility], [address] [place], [place] [address]    Resp panel by RT-PCR (RSV, Flu A&B, Covid) Anterior Nasal Swab     Status: None   Collection Time: 09/22/24  5:58 PM   Specimen: Anterior Nasal Swab  Result Value Ref Range Status   SARS Coronavirus 2 by RT PCR NEGATIVE NEGATIVE Final    Comment: (NOTE) SARS-CoV-2 target nucleic acids are NOT DETECTED.  The SARS-CoV-2 RNA is generally detectable in upper respiratory specimens during the acute phase of infection. The lowest concentration of SARS-CoV-2 viral copies this assay can detect is 138 copies/mL. A negative result does not preclude SARS-Cov-2 infection and should not be used as the sole basis for treatment or other patient management decisions. A negative result may occur with  improper specimen collection/handling, submission of specimen other than nasopharyngeal swab, presence of viral mutation(s) within the areas targeted by this assay, and inadequate number of viral copies(<138 copies/mL). A negative result must be combined with clinical observations, patient history, and epidemiological information. The expected result is Negative.  Fact Sheet for Patients:  bloggercourse.com  Fact Sheet for Healthcare Providers:  seriousbroker.it  This test is no t yet approved or cleared by the [place]  FDA and  has been authorized for detection and/or diagnosis of SARS-CoV-2 by FDA under an Emergency Use Authorization (EUA). This EUA will remain  in effect (meaning this test can be used) for the duration of the COVID-19 declaration under Section 564(b)(1) of the Act, 21 U.S.C.section 360bbb-3(b)(1), unless the authorization is terminated  or revoked sooner.       Influenza A by PCR NEGATIVE NEGATIVE Final   Influenza B by PCR NEGATIVE NEGATIVE Final    Comment: (NOTE) The Xpert Xpress SARS-CoV-2/FLU/RSV plus assay is intended as an aid in the diagnosis of influenza from Nasopharyngeal swab specimens and should not be used  as a sole basis for treatment. Nasal washings and aspirates are unacceptable for Xpert Xpress SARS-CoV-2/FLU/RSV testing.  Fact Sheet for Patients: bloggercourse.com  Fact Sheet for Healthcare Providers: seriousbroker.it  This test is not yet approved or cleared by the [place]  FDA and has been authorized for detection and/or diagnosis of  SARS-CoV-2 by FDA under an Emergency Use Authorization (EUA). This EUA will remain in effect (meaning this test can be used) for the duration of the COVID-19 declaration under Section 564(b)(1) of the Act, 21 U.S.C. section 360bbb-3(b)(1), unless the authorization is terminated or revoked.     Resp Syncytial Virus by PCR NEGATIVE NEGATIVE Final    Comment: (NOTE) Fact Sheet for Patients: bloggercourse.com  Fact Sheet for Healthcare Providers: seriousbroker.it  This test is not yet approved or cleared by the [place]  FDA and has been authorized for detection and/or diagnosis of SARS-CoV-2 by FDA under an Emergency Use Authorization (EUA). This EUA will remain in effect (meaning this test can be used) for the duration of the COVID-19 declaration under Section 564(b)(1) of the Act, 21 U.S.C. section 360bbb-3(b)(1), unless the authorization is terminated or revoked.  Performed at [facility], [address] [place], [place] [address]   Culture, blood (Routine X 2) w Reflex to ID Panel     Status: None   Collection Time: 09/24/24  6:18 PM   Specimen: BLOOD  Result Value Ref Range Status   Specimen Description BLOOD BLOOD RIGHT HAND  Final   Special Requests   Final    BOTTLES DRAWN AEROBIC AND ANAEROBIC Blood Culture adequate volume   Culture   Final    NO GROWTH 5 DAYS Performed at [facility], [address] [place], [place] [address]    Report Status 09/29/2024 FINAL  Final  MRSA Next Gen by PCR, Nasal      Status: Abnormal   Collection Time: 09/24/24  6:18 PM   Specimen: Nasal Mucosa; Nasal Swab  Result Value Ref Range Status   MRSA by PCR Next Gen DETECTED (A) NOT DETECTED Final    Comment: RESULT CALLED TO, READ BACK BY AND VERIFIED WITH:  GEORGINA OROZCO AT 2345 09/24/24 JG (NOTE) The GeneXpert MRSA Assay (FDA approved for NASAL specimens only), is one component of a comprehensive MRSA colonization surveillance program. It is not intended to diagnose MRSA infection nor to guide or monitor treatment for MRSA infections. Test performance is not FDA approved in patients less than 73 years old. Performed at [facility], [address] [place], [place] [address]   Culture, blood (Routine X 2) w Reflex to ID Panel     Status: None   Collection Time: 09/24/24  9:16 PM   Specimen: BLOOD  Result Value Ref Range Status   Specimen Description BLOOD BLOOD RIGHT HAND  Final   Special Requests   Final    BOTTLES DRAWN AEROBIC AND ANAEROBIC Blood Culture results may not be optimal due to an inadequate volume of blood received in culture bottles   Culture   Final    NO GROWTH 5 DAYS Performed at [facility], [address], [place], [place] [address]    Report Status 09/29/2024 FINAL  Final  Urine Culture (for pregnant, neutropenic or urologic patients or patients with an indwelling urinary catheter)     Status: None   Collection Time: 09/25/24 10:03 AM   Specimen: Urine, Clean Catch  Result Value Ref Range Status   Specimen Description   Final    URINE, CLEAN CATCH Performed at [facility], [address], [place], [place] [address]    Special Requests   Final    NONE Performed at [facility], [address] [place], [place] [address]    Culture   Final    NO GROWTH Performed  at [facility], [address], [place], [place] [address]    Report Status 09/26/2024 FINAL  Final  Gastrointestinal Panel by PCR , Stool     Status: None    Collection Time: 09/28/24  1:33 PM   Specimen: Stool  Result Value Ref Range Status   Campylobacter species NOT DETECTED NOT DETECTED Final   Plesimonas shigelloides NOT DETECTED NOT DETECTED Final   Salmonella species NOT DETECTED NOT DETECTED Final   Yersinia enterocolitica NOT DETECTED NOT DETECTED Final   Vibrio species NOT DETECTED NOT DETECTED Final   Vibrio cholerae NOT DETECTED NOT DETECTED Final   Enteroaggregative E coli (EAEC) NOT DETECTED NOT DETECTED Final   Enteropathogenic E coli (EPEC) NOT DETECTED NOT DETECTED Final   Enterotoxigenic E coli (ETEC) NOT DETECTED NOT DETECTED Final   Shiga like toxin producing E coli (STEC) NOT DETECTED NOT DETECTED Final   Shigella/Enteroinvasive E coli (EIEC) NOT DETECTED NOT DETECTED Final   Cryptosporidium NOT DETECTED NOT DETECTED Final   Cyclospora cayetanensis NOT DETECTED NOT DETECTED Final   Entamoeba histolytica NOT DETECTED NOT DETECTED Final   Giardia lamblia NOT DETECTED NOT DETECTED Final   Adenovirus F40/41 NOT DETECTED NOT DETECTED Final   Astrovirus NOT DETECTED NOT DETECTED Final   Norovirus GI/GII NOT DETECTED NOT DETECTED Final   Rotavirus A NOT DETECTED NOT DETECTED Final   Sapovirus (I, II, IV, and V) NOT DETECTED NOT DETECTED Final    Comment: Performed at [facility], [address] [place], [place] [address]    Coagulation Studies: No results for input(s): LABPROT, INR in the last 72 hours.  Urinalysis: No results for input(s): COLORURINE, LABSPEC, PHURINE, GLUCOSEU, HGBUR, BILIRUBINUR, KETONESUR, PROTEINUR, UROBILINOGEN, NITRITE, LEUKOCYTESUR in the last 72 hours.  Invalid input(s): APPERANCEUR     Imaging: DG Abd 1 View Result Date: 10/02/2024 CLINICAL DATA:  Nasogastric tube placement. EXAM: DG ABDOMEN 1V COMPARISON:  09/29/2024 FINDINGS: Nasogastric tube tip in the distal stomach and side hole in the mid to distal stomach. The included bowel-gas pattern is normal.  Lumbar and lower thoracic spine degenerative changes. IMPRESSION: Nasogastric tube tip in the distal stomach. Electronically Signed   By: Elspeth Bathe M.D.   On: 10/02/2024 14:08      Medications:    sodium chloride  50 mL/hr at 10/02/24 1148   feeding supplement (OSMOLITE 1.5 CAL) 1,000 mL (10/01/24 1827)    ARIPiprazole   2 mg Per Tube QHS   artificial tears  1 drop Both Eyes QHS   vitamin C   500 mg Per Tube BID   aspirin   81 mg Per Tube Daily   clopidogrel   75 mg Per Tube Daily   vitamin B-12  1,000 mcg Per Tube Daily   ergocalciferol  (VITAMIN D2)  50,000 Units Per Tube Weekly   feeding supplement (PROSource TF20)  60 mL Per Tube Daily   fiber  1 packet Per Tube BID   folic acid   1 mg Per Tube Daily   free water   300 mL Per Tube Q4H   guaiFENesin   10 mL Per Tube Q4H   heparin  injection (subcutaneous)  5,000 Units Subcutaneous Q8H   insulin  aspart  0-9 Units Subcutaneous Q4H   levETIRAcetam   500 mg Per Tube Q24H   Or   levETIRAcetam   500 mg Intravenous Q24H   multivitamin with minerals  1 tablet Per Tube Daily   omeprazole   20 mg Per Tube BID   mouth rinse  15 mL Mouth Rinse 4 times  per day   senna-docusate  2 tablet Per Tube QHS   sertraline   175 mg Per Tube Daily   zinc  sulfate (50mg  elemental zinc )  220 mg Per Tube Daily   acetaminophen  **OR** acetaminophen , camphor-menthol , ipratropium-albuterol , lactulose , loperamide  HCl, ondansetron  **OR** ondansetron  (ZOFRAN ) IV, mouth rinse  Assessment/ Plan:  74 y.o. male with with multiple medical problems including history of stroke, peripheral vascular disease, hypertension, type 2 diabetes admitted on 09/22/2024 for Acute metabolic encephalopathy [G93.41]   Acute kidney injury Chronic kidney disease, stage IIIa.  Creatinine 1.54/GFR 49 from 08/04/2022 Diabetes type 2 with chronic kidney disease History of chronic left hydronephrosis  history of kidney stones and left ureteral stent placement and removal in February  2024 Acute metabolic encephalopathy Acute metabolic acidosis   Patient has severe acute kidney injury.  He has history of kidney stones, left hydronephrosis and left ureteral stent placement.  Renal ultrasound from 09/24/2024 is negative for hydronephrosis.  Nonobstructing calculi in both kidneys  No new labs today however creatinine has stalled in the 3s. Will order gentle IV hydration, Normal saline 0.9% at 50ml./hr for today and monitor labs in am.  Continue receiving tube feeds.     Hemoglobin A1c 5.6% from 09/22/2024.   Management as as per primary team.  Urinary tract infection-completed ertapenem     LOS: 10 Faith Harris 1/8/20262:33 PM  [facility] [employer], [place] [phone]

## 2024-10-02 NOTE — TOC Progression Note (Addendum)
 Transition of Care [facility]) - Progression Note    Patient Details  Name: Rodney Estrada MRN: [id number] Date of Birth: September 13, 1951  [facility] CM/SW Contact  Rodney DELENA Daring, RN Phone Number: 10/02/2024, 3:56 PM  Clinical Narrative:    [facility] received notification from MD that patient family has chosen Hospice care.  Called patient sister, Rodney Estrada, and left VM asking for a choice on hospice providers and advised we would use [facility] unless she called back and advised differently.    Notified [facility] rep of Hospice referral.  4:13 PM Patient sister called back and confirmed they want to use [facility]. Advised that [facility] would see him within 24-48 hours after he returns to the facility and will manage his care.  She verbalized understanding.   Notified [facility] that patient will be discharged with  hospice care.   Expected Discharge Plan: Skilled Nursing Facility Barriers to Discharge: Continued Medical Work up               Expected Discharge Plan and Services   Discharge Planning Services: CM Consult   Living arrangements for the past 2 months: Skilled Nursing Facility                 DME Arranged: N/A         HH Arranged: NA           Social Drivers of Health (SDOH) Interventions SDOH Screenings   Food Insecurity: Patient Unable To Answer (09/22/2024)  Housing: Patient Unable To Answer (09/22/2024)  Transportation Needs: Patient Unable To Answer (09/22/2024)  Utilities: Patient Unable To Answer (09/22/2024)  Social Connections: Patient Unable To Answer (09/22/2024)  Tobacco Use: High Risk (09/22/2024)    Readmission Risk Interventions    09/23/2024   11:15 AM  Readmission Risk Prevention Plan  Transportation Screening Complete  PCP or Specialist Appt within 3-5 Days Complete  HRI or Home Care Consult Complete  Social Work Consult for Recovery Care Planning/Counseling Complete  Palliative Care Screening Not Applicable  Medication  Review Oceanographer) Complete

## 2024-10-02 NOTE — [place] FL2 (Signed)
 " [place]  MEDICAID FL2 LEVEL OF CARE FORM     IDENTIFICATION  Patient Name: Rodney Estrada Birthdate: Mar 12, 1951 Sex: male Admission Date (Current Location): 09/22/2024  [place] and [place] Number:  Chiropodist and Address:  [facility], [address], [place], [place] [address]      Provider Number: [id number]  Attending Physician Name and Address:  Leesa Kast, DO  Relative Name and Phone Number:  Rodney Estrada- sister- [phone]    Current Level of Care: Hospital Recommended Level of Care: Skilled Nursing Facility (with hospice through [facility]) Prior Approval Number:    Date Approved/Denied:   PASRR Number:    Discharge Plan: SNF (hospice with [facility])    Current Diagnoses: Patient Active Problem List   Diagnosis Date Noted   Urinary tract infection without hematuria 09/29/2024   Malnutrition of moderate degree 09/26/2024   Pyelonephritis 09/26/2024   Uremic encephalopathy 09/26/2024   Elevated troponin 09/23/2024   Acute metabolic encephalopathy 09/22/2024   Acute lower UTI 09/22/2024   Acute kidney injury superimposed on chronic kidney disease 09/22/2024   Type 2 diabetes mellitus with peripheral neuropathy (HCC) 09/22/2024   Seizure disorder (HCC) 09/22/2024   GERD without esophagitis 09/22/2024   Depression 09/22/2024   Gout 09/22/2024   Dyslipidemia 09/22/2024   Hyperkalemia 09/22/2024   Coronary artery disease 09/22/2024   Hypernatremia 07/31/2022   Hiccups 07/28/2022   Hypokalemia 07/27/2022   SBO (small bowel obstruction) (HCC) 07/23/2022   Nephrolithiasis 07/23/2022   Partial intestinal obstruction (HCC)    Cerebral edema (HCC) 04/16/2020   Hypertensive emergency 04/16/2020   Essential hypertension 04/16/2020   Hyperlipidemia LDL goal <70 04/16/2020   Type 2 diabetes mellitus (HCC) 04/16/2020   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 04/16/2020   AKI (acute kidney injury) (HCC) in the setting of stage  IIIa chronic kidney disease 04/16/2020   Tobacco use disorder 04/16/2020   Alcohol  abuse 04/16/2020   Cocaine abuse (HCC) 04/16/2020   Obesity 04/16/2020   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 04/05/2020    Orientation RESPIRATION BLADDER Height & Weight     Self  O2 (chronic) Incontinent Weight: 89 kg Height:  5' 9 (175.3 cm)  BEHAVIORAL SYMPTOMS/MOOD NEUROLOGICAL BOWEL NUTRITION STATUS      Incontinent Diet (Carb modified)  AMBULATORY STATUS COMMUNICATION OF NEEDS Skin   Total Care Verbally Bruising                       Personal Care Assistance Level of Assistance  Total care       Total Care Assistance: Maximum assistance   Functional Limitations Info             SPECIAL CARE FACTORS FREQUENCY                       Contractures Contractures Info: Present (lef hand, both legs)    Additional Factors Info  Code Status, Allergies Code Status Info: DNR Allergies Info: NKA           Current Medications (10/02/2024):  This is the current hospital active medication list Current Facility-Administered Medications  Medication Dose Route Frequency Provider Last Rate Last Admin   0.9 %  sodium chloride  infusion   Intravenous Continuous Dezii, Alexandra, DO       acetaminophen  (TYLENOL ) tablet 650 mg  650 mg Per Tube Q6H PRN Von Bellis, MD  Or   acetaminophen  (TYLENOL ) suppository 650 mg  650 mg Rectal Q6H PRN Von Bellis, MD       ARIPiprazole  (ABILIFY ) tablet 2 mg  2 mg Per Tube QHS Von Bellis, MD   2 mg at 10/01/24 2233   artificial tears ophthalmic solution 1 drop  1 drop Both Eyes QHS Mansy, Jan A, MD   1 drop at 10/01/24 2233   artificial tears ophthalmic solution 1 drop  1 drop Both Eyes QID PRN Dezii, Alexandra, DO       aspirin  chewable tablet 81 mg  81 mg Per Tube Daily Von Bellis, MD   81 mg at 10/02/24 1003   camphor-menthol  (SARNA) lotion 1 Application  1 Application Topical PRN  Mansy, Jan A, MD       clopidogrel  (PLAVIX ) tablet 75 mg  75 mg Per Tube Daily Von Bellis, MD   75 mg at 10/02/24 1009   glycopyrrolate  (ROBINUL ) tablet 1 mg  1 mg Oral Q4H PRN Dezii, Alexandra, DO       Or   glycopyrrolate  (ROBINUL ) injection 0.2 mg  0.2 mg Subcutaneous Q4H PRN Dezii, Alexandra, DO       Or   glycopyrrolate  (ROBINUL ) injection 0.2 mg  0.2 mg Intravenous Q4H PRN Dezii, Alexandra, DO       guaiFENesin  (ROBITUSSIN) 100 MG/5ML liquid 10 mL  10 mL Per Tube Q4H Von Bellis, MD   10 mL at 10/02/24 1048   haloperidol  lactate (HALDOL ) injection 2.5-5 mg  2.5-5 mg Intravenous Q4H PRN Dezii, Alexandra, DO       insulin  aspart (novoLOG ) injection 0-9 Units  0-9 Units Subcutaneous Q4H Von Bellis, MD   1 Units at 10/02/24 1011   ipratropium-albuterol  (DUONEB) 0.5-2.5 (3) MG/3ML nebulizer solution 3 mL  3 mL Inhalation Q4H PRN Mansy, Jan A, MD       lactulose  (CHRONULAC ) 10 GM/15ML solution 20 g  20 g Per Tube BID PRN Von Bellis, MD       levETIRAcetam  (KEPPRA ) tablet 500 mg  500 mg Per Tube Q24H Chappell, Alex B, RPH   500 mg at 10/02/24 1005   Or   levETIRAcetam  (KEPPRA ) undiluted injection 500 mg  500 mg Intravenous Q24H Chappell, Alex B, RPH       loperamide  HCl (IMODIUM ) 1 MG/7.5ML suspension 2 mg  2 mg Per Tube TID PRN Von Bellis, MD       LORazepam  (ATIVAN ) injection 2-4 mg  2-4 mg Intravenous Q4H PRN Dezii, Alexandra, DO       omeprazole  (KONVOMEP ) 2 mg/mL oral suspension 20 mg  20 mg Per Tube BID Von Bellis, MD   20 mg at 10/02/24 1024   ondansetron  (ZOFRAN ) tablet 4 mg  4 mg Per Tube Q6H PRN Von Bellis, MD       Or   ondansetron  (ZOFRAN ) injection 4 mg  4 mg Intravenous Q6H PRN Von Bellis, MD       Oral care mouth rinse  15 mL Mouth Rinse 4 times per day Von Bellis, MD   15 mL at 10/02/24 1150   Oral care mouth rinse  15 mL Mouth Rinse PRN Von Bellis, MD       senna-docusate (Senokot-S) tablet 2 tablet  2 tablet Per Tube QHS Von Bellis, MD   2  tablet at 09/27/24 2150   sertraline  (ZOLOFT ) tablet 175 mg  175 mg Per Tube Daily Von Bellis, MD   175 mg at 10/02/24 1005   zinc  sulfate (50mg  elemental  zinc ) capsule 220 mg  220 mg Per Tube Daily Von Bellis, MD   220 mg at 10/02/24 1005     Discharge Medications: Please see discharge summary for a list of discharge medications.  Relevant Imaging Results:  Relevant Lab Results:   Additional Information SSN: [id number]  Rodney DELENA Daring, RN     "

## 2024-10-02 NOTE — Progress Notes (Signed)
 " PROGRESS NOTE    Rodney Estrada  FMW:[id number] DOB: [birth year] DOA: 09/22/2024 PCP: Pcp, No  No chief complaint on file.   Hospital Course:  Rodney Estrada is a 74 year old male with CAD, chronic systolic CHF, gout, hypertension, dyslipidemia, A-fib, prior CVA, type 2 diabetes, who presented to the ED 12/29 with acute onset altered mental status from his SNF.  Patient had been nonverbal with increasing tremors.  On arrival to the ED he was found to have UTI, started on IV fluids, vancomycin , cefepime .  He was also found to have AKI on CKD stage III E with associated hyperkalemia.  There is also concern for elevated troponin so heparin  drip was started.  Patient then developed hematuria so heparin  drip was discontinued.    Subjective: Reportedly patient moved his NG tube overnight.  It was replaced this morning position confirmed on abdominal film. On my evaluation patient opens his eyes and tracks, briefly shakes his head no when asked if he has any pain.  Does not otherwise participate in evaluation.  Objective: Vitals:   10/02/24 0322 10/02/24 0505 10/02/24 0748 10/02/24 1143  BP: (!) 145/77  (!) 149/79 (!) 151/73  Pulse: 67  65 60  Resp: (!) 22  15 17   Temp: 98.8 F (37.1 C)  98.3 F (36.8 C) 97.7 F (36.5 C)  TempSrc:      SpO2: 93%  93% 94%  Weight:  89 kg    Height:        Intake/Output Summary (Last 24 hours) at 10/02/2024 1501 Last data filed at 10/02/2024 1149 Gross per 24 hour  Intake 3375 ml  Output 2650 ml  Net 725 ml   Filed Weights   09/30/24 0629 10/01/24 0600 10/02/24 0505  Weight: 94 kg 89 kg 89 kg    Examination: General exam: Appears calm and comfortable, NAD  Respiratory system: No work of breathing, symmetric chest wall expansion Cardiovascular system: S1 & S2 heard, RRR.  Gastrointestinal system: Abdomen is nondistended, soft and nontender, NGT in place Neuro: Awakens briefly.  Shakes head to question, does not participate otherwise, does not follow  commands  Assessment & Plan:  Principal Problem:   Acute metabolic encephalopathy Active Problems:   Acute lower UTI   Elevated troponin   Acute kidney injury superimposed on chronic kidney disease   Hyperkalemia   Essential hypertension   Type 2 diabetes mellitus with peripheral neuropathy (HCC)   Seizure disorder (HCC)   GERD without esophagitis   Depression   Gout   Dyslipidemia   Coronary artery disease   Malnutrition of moderate degree   Pyelonephritis   Uremic encephalopathy   Urinary tract infection without hematuria     Comfort measures only - Patient has had extensive hospital course as outlined below, superimposed on history of very large CVA in 2021.  He has had minimal improvement in mentation over the last 9 days.  He has been unable to sustain nutrition p.o. and has had an NG tube throughout his stay.  He has removed his NG tube and it has been replaced.  His kidney function has been slowly declining and dialysis is not an option due to his multiple comorbidities.  In light of his persistent encephalopathy, prior brain injury, and declining kidney function, he has an expected life expectancy of <6 months, and more likely weeks.  I have had extensive discussion with his sister, and POA, Rodney Estrada today on the phone.  We discussed the multiple problems that the patient is facing  and his persistent encephalopathy.  We discussed the multiple risks of PEG tube.  After discussion, Rodney Estrada has decided to proceed with hospice care. I have made him comfort measures only. - Discontinue all medications that do not immediately provide him comfort or quality of life - Discontinue NG tube and tube feeds - Allow for pleasure feeds - Qshift only vitals - TOC consulted for outpatient placement, likely [facility]   Severe sepsis secondary to UTI - On arrival: Leukocytosis, tachycardic, tachypneic, hypotensive - Fluid responsive, did not require pressors - Was  transferred to the ICU initially but lactic acid within normal range and vitals improved to transfer to progressive instead - Status post vancomycin  which was discontinued - Status post ceftriaxone  for 1/2 - Invanz  per infectious disease 1/2 - 1/6, daptomycin  per ID 1/3-1/5  Acute metabolic encephalopathy secondary to uremia history of large CVA 2021, residual left-sided hemiparesis - Brain MRI: No acute intracranial abnormality.  Chronic right MCA territory encephalomalacia with ex vacuo dilation of the right lateral ventricle, extensive right hemispheric gliosis and wallerian degeneration of the right cerebral peduncle.  Multifocal T2 hyperintense signal within the cerebral white matter most likely secondary to chronic small vessel disease - Keppra  levels elevated - Mentation continues to wax and wane - Patient has not been able to sustain oral nutrition.    AKI CKD stage IIIa Metabolic acidosis History of chronic left hydronephrosis History of kidney stones and left ureteral stent placement and removal in February 2024 - Nephrology was consulted for hemodialysis - Patient started on bicarb infusion - Renal function slowly improving - Nephrology is recommending to avoid hemodialysis for now  Acute lower UTI - Status post IV Rocephin  - 1/1 repeated urine cultures negative - Antibiotic course as below - ID following  Staph epidermidis blood culture positive - Presumed contamination - Urine culture negative - ID consultation as above.  Antibiotic course as below  Elevated troponin - Thought to be secondary to demand ischemia - Status post IV heparin  which has since been discontinued - Cardiology was consulted and recommended no intervention.  Signed off on 12/31 - Echocardiogram: LVEF over 55%, left ventricular endocardial border not optimally defined to evaluate RWMA.  Moderate LVH  Gross hematuria - Resolved - May have been secondary to UTI with heparin   drip  Hyperkalemia Hypokalemia Hypophosphatemia - Resolved - Monitor BMP  Essential hypertension - Blood pressure is initially low secondary to sepsis - Gradually resume home meds as needed  CAD - At home on aspirin , Plavix , statin  Gout - Allopurinol  currently on hold secondary to AKI  Depression - Continue home dose Zoloft  and Abilify   GERD without esophagitis - Continue PPI  Seizure disorder - Continue Keppra   Constipation - Continue bowel regimen  Type 2 diabetes with peripheral neuropathy - Not on antihyperglycemics at home - Hemoglobin A1c: 5.6%  Steroid therapy - Patient was on 20 mg prednisone at home which was started on 12/26.  This is not a chronic medication.  Was discontinued earlier this admission  Acute on chronic normocytic anemia Anemia of chronic disease, iron deficiency anemia Folate deficiency - Baseline hemoglobin around 10, has been gradually downtrending throughout this admission likely secondary to renal disease.  Acute hematuria resolved.  Monitor. - Anemia panel: Iron 21, TIBC 253, saturation is low   DVT prophylaxis: Heparin    Code Status: Limited: Do not attempt resuscitation (DNR) -DNR-LIMITED -Do Not Intubate/DNI  Disposition:  Pending outpatient hospice arrangements  Consultants:  Treatment Team:  Consulting Physician: Wilburn Keller BROCKS, MD Consulting Physician: Dennise Capri, MD Consulting Physician: Malka Domino, MD Consulting Physician: Fayette Bodily, MD  Procedures:    Antimicrobials:  Anti-infectives (From admission, onward)    Start     Dose/Rate Route Frequency Ordered Stop   09/30/24 1245  ertapenem  (INVANZ ) 500 mg in sodium chloride  0.9 % 50 mL IVPB        500 mg 100 mL/hr over 30 Minutes Intravenous Every 24 hours 09/30/24 0927 09/30/24 1239   09/28/24 1400  DAPTOmycin  (CUBICIN ) 600 mg in sodium chloride  0.9 % IVPB  Status:  Discontinued        600 mg 124 mL/hr over 30 Minutes Intravenous  Every 48 hours 09/27/24 0742 09/29/24 1132   09/27/24 1400  DAPTOmycin  (CUBICIN ) 600 mg in sodium chloride  0.9 % IVPB  Status:  Discontinued        600 mg 124 mL/hr over 30 Minutes Intravenous Every 48 hours 09/26/24 0712 09/26/24 1145   09/26/24 1400  DAPTOmycin  (CUBICIN ) 600 mg in sodium chloride  0.9 % IVPB  Status:  Discontinued        600 mg 124 mL/hr over 30 Minutes Intravenous Every 48 hours 09/25/24 0936 09/26/24 0712   09/26/24 1215  ertapenem  (INVANZ ) 500 mg in sodium chloride  0.9 % 50 mL IVPB  Status:  Discontinued        500 mg 100 mL/hr over 30 Minutes Intravenous Every 24 hours 09/26/24 1159 09/30/24 0927   09/25/24 1400  DAPTOmycin  (CUBICIN ) 600 mg in sodium chloride  0.9 % IVPB  Status:  Discontinued        8 mg/kg  77 kg 124 mL/hr over 30 Minutes Intravenous Every 48 hours 09/25/24 0921 09/25/24 0923   09/24/24 0115  vancomycin  (VANCOREADY) IVPB 1750 mg/350 mL        1,750 mg 175 mL/hr over 120 Minutes Intravenous  Once 09/24/24 0104 09/24/24 0505   09/23/24 1800  cefTRIAXone  (ROCEPHIN ) 2 g in sodium chloride  0.9 % 100 mL IVPB  Status:  Discontinued        2 g 200 mL/hr over 30 Minutes Intravenous Every 24 hours 09/23/24 1446 09/26/24 1159   09/23/24 1448  vancomycin  variable dose per unstable renal function (pharmacist dosing)  Status:  Discontinued         Does not apply See admin instructions 09/23/24 1448 09/25/24 0921   09/22/24 1915  vancomycin  (VANCOREADY) IVPB 1750 mg/350 mL        1,750 mg 175 mL/hr over 120 Minutes Intravenous  Once 09/22/24 1849 09/23/24 0113   09/22/24 1845  ceFEPIme  (MAXIPIME ) 2 g in sodium chloride  0.9 % 100 mL IVPB        2 g 200 mL/hr over 30 Minutes Intravenous  Once 09/22/24 1840 09/22/24 1923   09/22/24 1845  vancomycin  (VANCOCIN ) IVPB 1000 mg/200 mL premix  Status:  Discontinued        1,000 mg 200 mL/hr over 60 Minutes Intravenous  Once 09/22/24 1840 09/22/24 1848       Data Reviewed: I have personally reviewed following labs and  imaging studies CBC: Recent Labs  Lab 09/26/24 0320 09/27/24 0423 09/28/24 0421 09/29/24 0431 10/01/24 0422  WBC 5.9 5.6 6.5 8.1 9.1  HGB 9.8* 9.7* 9.7* 9.8* 8.9*  HCT 30.0* 29.7* 30.0* 30.5* 28.2*  MCV 92.6 91.4 91.7 92.7 95.6  PLT 122* 120* 125* 142* 223   Basic Metabolic Panel: Recent Labs  Lab 09/26/24 0320 09/27/24 0423 09/27/24 1954 09/28/24 0421 09/28/24 1558  09/29/24 0431 09/30/24 0352 10/01/24 0422  NA 145 146*   < > 152* 150* 148* 146* 145  K 3.9 3.1*   < > 3.2* 3.9 4.0 4.4 5.3*  CL 102 105   < > 109 110 110 109 107  CO2 20* 27   < > 28 27 27 26 25   GLUCOSE 108* 188*   < > 173* 160* 169* 140* 130*  BUN 102* 84*   < > 74* 70* 63* 60* 61*  CREATININE 5.04* 4.16*   < > 3.50* 3.22* 3.16* 3.26* 3.39*  CALCIUM  8.0* 8.1*   < > 7.9* 7.8* 8.3* 7.8* 8.1*  MG 2.3 2.2  --  2.0  --   --  1.8 1.9  PHOS 4.6 3.5  --  2.7  --  1.3* 2.5 4.2   < > = values in this interval not displayed.   GFR: Estimated Creatinine Clearance: 21.7 mL/min (A) (by C-G formula based on SCr of 3.39 mg/dL (H)). Liver Function Tests: No results for input(s): AST, ALT, ALKPHOS, BILITOT, PROT, ALBUMIN in the last 168 hours. CBG: Recent Labs  Lab 10/01/24 1938 10/01/24 2259 10/02/24 0322 10/02/24 0749 10/02/24 1144  GLUCAP 116* 133* 130* 133* 95    Recent Results (from the past 240 hours)  Culture, blood (routine x 2)     Status: Abnormal   Collection Time: 09/22/24  5:37 PM   Specimen: BLOOD LEFT HAND  Result Value Ref Range Status   Specimen Description   Final    BLOOD LEFT HAND Performed at [facility], [address] [place], [place] [address]    Special Requests   Final    BOTTLES DRAWN AEROBIC AND ANAEROBIC Blood Culture results may not be optimal due to an inadequate volume of blood received in culture bottles Performed at [facility], [address], [place], [place] [address]    Culture  Setup Time   Final    GRAM POSITIVE COCCI AEROBIC  BOTTLE ONLY CRITICAL VALUE NOTED.  VALUE IS CONSISTENT WITH PREVIOUSLY REPORTED AND CALLED VALUE. Performed at [facility], [address] [place], [place] [address]    Culture STAPHYLOCOCCUS EPIDERMIDIS (A)  Final   Report Status 09/25/2024 FINAL  Final   Organism ID, Bacteria STAPHYLOCOCCUS EPIDERMIDIS  Final      Susceptibility   Staphylococcus epidermidis - MIC*    CIPROFLOXACIN 4 RESISTANT Resistant     ERYTHROMYCIN >=8 RESISTANT Resistant     GENTAMICIN  >=16 RESISTANT Resistant     OXACILLIN >=4 RESISTANT Resistant     TETRACYCLINE <=1 SENSITIVE Sensitive     VANCOMYCIN  2 SENSITIVE Sensitive     TRIMETH/SULFA 160 RESISTANT Resistant     CLINDAMYCIN >=8 RESISTANT Resistant     RIFAMPIN <=0.5 SENSITIVE Sensitive     Inducible Clindamycin NEGATIVE Sensitive     * STAPHYLOCOCCUS EPIDERMIDIS  Culture, blood (routine x 2)     Status: Abnormal   Collection Time: 09/22/24  5:57 PM   Specimen: Right Antecubital; Blood  Result Value Ref Range Status   Specimen Description   Final    RIGHT ANTECUBITAL Performed at [facility], [address] [place], [place] [address]    Special Requests   Final    BOTTLES DRAWN AEROBIC AND ANAEROBIC Blood Culture results may not be optimal due to an inadequate volume of blood received in culture bottles Performed at [facility], [address] [place], [place] [address]    Culture  Setup  Time   Final    GRAM POSITIVE COCCI ANAEROBIC BOTTLE ONLY CRITICAL RESULT CALLED TO, READ BACK BY AND VERIFIED WITH: EMILY STEINBOCK 09/23/24 1424 KLW    Culture STAPHYLOCOCCUS EPIDERMIDIS (A)  Final   Report Status 09/25/2024 FINAL  Final   Organism ID, Bacteria STAPHYLOCOCCUS EPIDERMIDIS  Final      Susceptibility   Staphylococcus epidermidis - MIC*    CIPROFLOXACIN <=0.5 SENSITIVE Sensitive     ERYTHROMYCIN >=8 RESISTANT Resistant     GENTAMICIN  >=16 RESISTANT Resistant     OXACILLIN >=4 RESISTANT Resistant      TETRACYCLINE <=1 SENSITIVE Sensitive     VANCOMYCIN  1 SENSITIVE Sensitive     TRIMETH/SULFA 160 RESISTANT Resistant     CLINDAMYCIN <=0.25 SENSITIVE Sensitive     RIFAMPIN <=0.5 SENSITIVE Sensitive     Inducible Clindamycin NEGATIVE Sensitive     * STAPHYLOCOCCUS EPIDERMIDIS  Blood Culture ID Panel (Reflexed)     Status: Abnormal   Collection Time: 09/22/24  5:57 PM  Result Value Ref Range Status   Enterococcus faecalis NOT DETECTED NOT DETECTED Final   Enterococcus Faecium NOT DETECTED NOT DETECTED Final   Listeria monocytogenes NOT DETECTED NOT DETECTED Final   Staphylococcus species DETECTED (A) NOT DETECTED Final    Comment: CRITICAL RESULT CALLED TO, READ BACK BY AND VERIFIED WITH: EMILY STEINBOCK 09/23/24 1424 KLW    Staphylococcus aureus (BCID) NOT DETECTED NOT DETECTED Final   Staphylococcus epidermidis DETECTED (A) NOT DETECTED Final    Comment: Methicillin (oxacillin) resistant coagulase negative staphylococcus. Possible blood culture contaminant (unless isolated from more than one blood culture draw or clinical case suggests pathogenicity). No antibiotic treatment is indicated for blood  culture contaminants. CRITICAL RESULT CALLED TO, READ BACK BY AND VERIFIED WITH: EMILY STEINBOCK 09/23/24 1424 KLW    Staphylococcus lugdunensis NOT DETECTED NOT DETECTED Final   Streptococcus species NOT DETECTED NOT DETECTED Final   Streptococcus agalactiae NOT DETECTED NOT DETECTED Final   Streptococcus pneumoniae NOT DETECTED NOT DETECTED Final   Streptococcus pyogenes NOT DETECTED NOT DETECTED Final   A.calcoaceticus-baumannii NOT DETECTED NOT DETECTED Final   Bacteroides fragilis NOT DETECTED NOT DETECTED Final   Enterobacterales NOT DETECTED NOT DETECTED Final   Enterobacter cloacae complex NOT DETECTED NOT DETECTED Final   Escherichia coli NOT DETECTED NOT DETECTED Final   Klebsiella aerogenes NOT DETECTED NOT DETECTED Final   Klebsiella oxytoca NOT DETECTED NOT DETECTED Final    Klebsiella pneumoniae NOT DETECTED NOT DETECTED Final   Proteus species NOT DETECTED NOT DETECTED Final   Salmonella species NOT DETECTED NOT DETECTED Final   Serratia marcescens NOT DETECTED NOT DETECTED Final   Haemophilus influenzae NOT DETECTED NOT DETECTED Final   Neisseria meningitidis NOT DETECTED NOT DETECTED Final   Pseudomonas aeruginosa NOT DETECTED NOT DETECTED Final   Stenotrophomonas maltophilia NOT DETECTED NOT DETECTED Final   Candida albicans NOT DETECTED NOT DETECTED Final   Candida auris NOT DETECTED NOT DETECTED Final   Candida glabrata NOT DETECTED NOT DETECTED Final   Candida krusei NOT DETECTED NOT DETECTED Final   Candida parapsilosis NOT DETECTED NOT DETECTED Final   Candida tropicalis NOT DETECTED NOT DETECTED Final   Cryptococcus neoformans/gattii NOT DETECTED NOT DETECTED Final   Methicillin resistance mecA/C DETECTED (A) NOT DETECTED Final    Comment: CRITICAL RESULT CALLED TO, READ BACK BY AND VERIFIED WITH: EMILY STEINBOCK 09/23/24 1424 KLW Performed at [facility], [address] [place], [place] [address]   Resp panel by RT-PCR (RSV, Flu  A&B, Covid) Anterior Nasal Swab     Status: None   Collection Time: 09/22/24  5:58 PM   Specimen: Anterior Nasal Swab  Result Value Ref Range Status   SARS Coronavirus 2 by RT PCR NEGATIVE NEGATIVE Final    Comment: (NOTE) SARS-CoV-2 target nucleic acids are NOT DETECTED.  The SARS-CoV-2 RNA is generally detectable in upper respiratory specimens during the acute phase of infection. The lowest concentration of SARS-CoV-2 viral copies this assay can detect is 138 copies/mL. A negative result does not preclude SARS-Cov-2 infection and should not be used as the sole basis for treatment or other patient management decisions. A negative result may occur with  improper specimen collection/handling, submission of specimen other than nasopharyngeal swab, presence of viral mutation(s) within the areas targeted by  this assay, and inadequate number of viral copies(<138 copies/mL). A negative result must be combined with clinical observations, patient history, and epidemiological information. The expected result is Negative.  Fact Sheet for Patients:  bloggercourse.com  Fact Sheet for Healthcare Providers:  seriousbroker.it  This test is no t yet approved or cleared by the [place]  FDA and  has been authorized for detection and/or diagnosis of SARS-CoV-2 by FDA under an Emergency Use Authorization (EUA). This EUA will remain  in effect (meaning this test can be used) for the duration of the COVID-19 declaration under Section 564(b)(1) of the Act, 21 U.S.C.section 360bbb-3(b)(1), unless the authorization is terminated  or revoked sooner.       Influenza A by PCR NEGATIVE NEGATIVE Final   Influenza B by PCR NEGATIVE NEGATIVE Final    Comment: (NOTE) The Xpert Xpress SARS-CoV-2/FLU/RSV plus assay is intended as an aid in the diagnosis of influenza from Nasopharyngeal swab specimens and should not be used as a sole basis for treatment. Nasal washings and aspirates are unacceptable for Xpert Xpress SARS-CoV-2/FLU/RSV testing.  Fact Sheet for Patients: bloggercourse.com  Fact Sheet for Healthcare Providers: seriousbroker.it  This test is not yet approved or cleared by the [place]  FDA and has been authorized for detection and/or diagnosis of SARS-CoV-2 by FDA under an Emergency Use Authorization (EUA). This EUA will remain in effect (meaning this test can be used) for the duration of the COVID-19 declaration under Section 564(b)(1) of the Act, 21 U.S.C. section 360bbb-3(b)(1), unless the authorization is terminated or revoked.     Resp Syncytial Virus by PCR NEGATIVE NEGATIVE Final    Comment: (NOTE) Fact Sheet for Patients: bloggercourse.com  Fact Sheet  for Healthcare Providers: seriousbroker.it  This test is not yet approved or cleared by the [place]  FDA and has been authorized for detection and/or diagnosis of SARS-CoV-2 by FDA under an Emergency Use Authorization (EUA). This EUA will remain in effect (meaning this test can be used) for the duration of the COVID-19 declaration under Section 564(b)(1) of the Act, 21 U.S.C. section 360bbb-3(b)(1), unless the authorization is terminated or revoked.  Performed at [facility], [address] [place], [place] [address]   Culture, blood (Routine X 2) w Reflex to ID Panel     Status: None   Collection Time: 09/24/24  6:18 PM   Specimen: BLOOD  Result Value Ref Range Status   Specimen Description BLOOD BLOOD RIGHT HAND  Final   Special Requests   Final    BOTTLES DRAWN AEROBIC AND ANAEROBIC Blood Culture adequate volume   Culture   Final    NO GROWTH 5 DAYS Performed at [facility], [address] [place],  [place] [address]    Report Status 09/29/2024 FINAL  Final  MRSA Next Gen by PCR, Nasal     Status: Abnormal   Collection Time: 09/24/24  6:18 PM   Specimen: Nasal Mucosa; Nasal Swab  Result Value Ref Range Status   MRSA by PCR Next Gen DETECTED (A) NOT DETECTED Final    Comment: RESULT CALLED TO, READ BACK BY AND VERIFIED WITH:  GEORGINA OROZCO AT 2345 09/24/24 JG (NOTE) The GeneXpert MRSA Assay (FDA approved for NASAL specimens only), is one component of a comprehensive MRSA colonization surveillance program. It is not intended to diagnose MRSA infection nor to guide or monitor treatment for MRSA infections. Test performance is not FDA approved in patients less than 52 years old. Performed at [facility], [address] [place], [place] [address]   Culture, blood (Routine X 2) w Reflex to ID Panel     Status: None   Collection Time: 09/24/24  9:16 PM   Specimen: BLOOD  Result Value Ref Range Status    Specimen Description BLOOD BLOOD RIGHT HAND  Final   Special Requests   Final    BOTTLES DRAWN AEROBIC AND ANAEROBIC Blood Culture results may not be optimal due to an inadequate volume of blood received in culture bottles   Culture   Final    NO GROWTH 5 DAYS Performed at [facility], [address] [place], [place] [address]    Report Status 09/29/2024 FINAL  Final  Urine Culture (for pregnant, neutropenic or urologic patients or patients with an indwelling urinary catheter)     Status: None   Collection Time: 09/25/24 10:03 AM   Specimen: Urine, Clean Catch  Result Value Ref Range Status   Specimen Description   Final    URINE, CLEAN CATCH Performed at [facility], [address] [place], [place] [address]    Special Requests   Final    NONE Performed at [facility], [address] [place], [place] [address]    Culture   Final    NO GROWTH Performed at [facility], [address] [place], [place] [address]    Report Status 09/26/2024 FINAL  Final  Gastrointestinal Panel by PCR , Stool     Status: None   Collection Time: 09/28/24  1:33 PM   Specimen: Stool  Result Value Ref Range Status   Campylobacter species NOT DETECTED NOT DETECTED Final   Plesimonas shigelloides NOT DETECTED NOT DETECTED Final   Salmonella species NOT DETECTED NOT DETECTED Final   Yersinia enterocolitica NOT DETECTED NOT DETECTED Final   Vibrio species NOT DETECTED NOT DETECTED Final   Vibrio cholerae NOT DETECTED NOT DETECTED Final   Enteroaggregative E coli (EAEC) NOT DETECTED NOT DETECTED Final   Enteropathogenic E coli (EPEC) NOT DETECTED NOT DETECTED Final   Enterotoxigenic E coli (ETEC) NOT DETECTED NOT DETECTED Final   Shiga like toxin producing E coli (STEC) NOT DETECTED NOT DETECTED Final   Shigella/Enteroinvasive E coli (EIEC) NOT DETECTED NOT DETECTED Final   Cryptosporidium NOT DETECTED NOT DETECTED Final   Cyclospora cayetanensis NOT DETECTED NOT  DETECTED Final   Entamoeba histolytica NOT DETECTED NOT DETECTED Final   Giardia lamblia NOT DETECTED NOT DETECTED Final   Adenovirus F40/41 NOT DETECTED NOT DETECTED Final   Astrovirus NOT DETECTED NOT DETECTED Final   Norovirus GI/GII NOT DETECTED NOT DETECTED Final   Rotavirus A NOT DETECTED NOT DETECTED Final   Sapovirus (I, II, IV, and V)  NOT DETECTED NOT DETECTED Final    Comment: Performed at [facility], [address] [place], [place] [address]     Radiology Studies: DG Abd 1 View Result Date: 10/02/2024 CLINICAL DATA:  Nasogastric tube placement. EXAM: DG ABDOMEN 1V COMPARISON:  09/29/2024 FINDINGS: Nasogastric tube tip in the distal stomach and side hole in the mid to distal stomach. The included bowel-gas pattern is normal. Lumbar and lower thoracic spine degenerative changes. IMPRESSION: Nasogastric tube tip in the distal stomach. Electronically Signed   By: Elspeth Bathe M.D.   On: 10/02/2024 14:08    Scheduled Meds:  ARIPiprazole   2 mg Per Tube QHS   artificial tears  1 drop Both Eyes QHS   vitamin C   500 mg Per Tube BID   aspirin   81 mg Per Tube Daily   clopidogrel   75 mg Per Tube Daily   vitamin B-12  1,000 mcg Per Tube Daily   ergocalciferol  (VITAMIN D2)  50,000 Units Per Tube Weekly   feeding supplement (PROSource TF20)  60 mL Per Tube Daily   fiber  1 packet Per Tube BID   folic acid   1 mg Per Tube Daily   free water   300 mL Per Tube Q4H   guaiFENesin   10 mL Per Tube Q4H   heparin  injection (subcutaneous)  5,000 Units Subcutaneous Q8H   insulin  aspart  0-9 Units Subcutaneous Q4H   levETIRAcetam   500 mg Per Tube Q24H   Or   levETIRAcetam   500 mg Intravenous Q24H   multivitamin with minerals  1 tablet Per Tube Daily   omeprazole   20 mg Per Tube BID   mouth rinse  15 mL Mouth Rinse 4 times per day   senna-docusate  2 tablet Per Tube QHS   sertraline   175 mg Per Tube Daily   zinc  sulfate (50mg  elemental zinc )  220 mg Per Tube Daily   Continuous  Infusions:  sodium chloride  50 mL/hr at 10/02/24 1148   feeding supplement (OSMOLITE 1.5 CAL) 1,000 mL (10/01/24 1827)     LOS: 10 days  MDM: Patient is high risk for one or more organ failure.  They necessitate ongoing hospitalization for continued IV therapies and subsequent lab monitoring. Total time spent interpreting labs and vitals, reviewing the medical record, coordinating care amongst consultants and care team members, directly assessing and discussing care with the patient and/or family: 55 min Addeline Calarco, DO [facility]  To contact the attending physician between 7A-7P please use Epic Chat. To contact the covering physician during after hours 7P-7A, please review Amion.  10/02/2024, 3:01 PM   *This document has been created with the assistance of dictation software. Please excuse typographical errors. *   "

## 2024-10-02 NOTE — Plan of Care (Signed)

## 2024-10-02 NOTE — Progress Notes (Signed)
 [facility] Room 232 [facility] Liaison Note  Referral received today from ICM, Shasta Daring, RN for hospice services at [facility].  Voice mail message left with patient's sister that the referral was being submitted today.  Please call with any hospice related questions or concerns  Thank you for the opportunity to participate in this patient's care  [facility] [phone]

## 2024-10-03 DIAGNOSIS — E1142 Type 2 diabetes mellitus with diabetic polyneuropathy: Secondary | ICD-10-CM

## 2024-10-03 DIAGNOSIS — E440 Moderate protein-calorie malnutrition: Secondary | ICD-10-CM

## 2024-10-03 DIAGNOSIS — K219 Gastro-esophageal reflux disease without esophagitis: Secondary | ICD-10-CM

## 2024-10-03 DIAGNOSIS — G40909 Epilepsy, unspecified, not intractable, without status epilepticus: Secondary | ICD-10-CM

## 2024-10-03 LAB — GLUCOSE, CAPILLARY: Glucose-Capillary: 92 mg/dL (ref 70–99)

## 2024-10-03 MED ORDER — SERTRALINE HCL 25 MG PO TABS: 175.0000 mg | ORAL_TABLET | Freq: Every day | ORAL | Status: AC

## 2024-10-03 NOTE — TOC Transition Note (Signed)
 Transition of Care [facility]) - Discharge Note   Patient Details  Name: Rodney Estrada MRN: [id number] Date of Birth: 1950-12-30  Transition of Care [facility]) CM/SW Contact:  Nathanael CHRISTELLA Ring, RN Phone Number: 10/03/2024, 12:43 PM   Clinical Narrative:    Patient is ready to discharge back to [facility] with hospice care through [facility]. Notified Saddie with [facility] of discharge today.  Patient will be going to room B12B, bedside nurse will call report to [phone].  CM will arrange transportation with [facility].    Final next level of care: Skilled Nursing Facility Barriers to Discharge: Barriers Resolved   Patient Goals and CMS Choice            Discharge Placement              Patient chooses bed at: [facility] Patient to be transferred to facility by: [facility] Name of family member notified: Rhoda - daughter Patient and family notified of of transfer: 10/03/24  Discharge Plan and Services Additional resources added to the After Visit Summary for     Discharge Planning Services: CM Consult            DME Arranged: N/A         HH Arranged: NA          Social Drivers of Health (SDOH) Interventions SDOH Screenings   Food Insecurity: Patient Unable To Answer (09/22/2024)  Housing: Patient Unable To Answer (09/22/2024)  Transportation Needs: Patient Unable To Answer (09/22/2024)  Utilities: Patient Unable To Answer (09/22/2024)  Social Connections: Patient Unable To Answer (09/22/2024)  Tobacco Use: High Risk (09/22/2024)     Readmission Risk Interventions    09/23/2024   11:15 AM  Readmission Risk Prevention Plan  Transportation Screening Complete  PCP or Specialist Appt within 3-5 Days Complete  HRI or Home Care Consult Complete  Social Work Consult for Recovery Care Planning/Counseling Complete  Palliative Care Screening Not Applicable  Medication Review Oceanographer) Complete

## 2024-10-03 NOTE — Discharge Summary (Signed)
 " DISCHARGE SUMMARY    Rodney Estrada FMW:[id number] DOB: 1951/01/07 DOA: 09/22/2024  PCP: Pcp, No  Admit date: 09/22/2024 Discharge date: 10/03/2024   Recommendations for Outpatient Follow-up:  Hospice liaison to assume care once patient arrives at long-term care facility    Hospital Course: Rodney Estrada is a 74 year old male with CAD, chronic systolic CHF, gout, hypertension, dyslipidemia, A-fib, prior CVA, type 2 diabetes, who presented to the ED 12/29 with acute onset altered mental status from his SNF.  Patient had been nonverbal with increasing tremors.  On arrival to the ED he was found to have UTI, started on IV fluids, vancomycin , cefepime .  He was also found to have AKI on CKD stage III E with associated hyperkalemia.  There is also concern for elevated troponin so heparin  drip was started.  Patient then developed hematuria so heparin  drip was discontinued.  Ultimately elevated troponin thought to be secondary to demand ischemia.  Patient received treatment of his UTI and ultimately was transferred out of the ICU.  His encephalopathy was persistent.  His mentation waxed and waned but he was never alert enough to sustain overall nutrition.  He had an NG tube for the entirety of his stay.  On 1/8 I had extensive discussion with the patient's sister discussing permanent source of nutrition.  She has elected to make him comfort measures only and proceed with hospice at discharge.  TOC was consulted for assistance in these arrangements.  He is discharging directly to [facility] today with plan for hospice transition.  Comfort measures only - Patient has had extensive hospital course as outlined below, superimposed on history of very large CVA in 2021.  He has had minimal improvement in mentation over the last 9 days.  He has been unable to sustain nutrition p.o. and has had an NG tube throughout his stay.  He has removed his NG tube and it has been replaced.  His kidney function has  been slowly declining and dialysis is not an option due to his multiple comorbidities.  In light of his persistent encephalopathy, prior brain injury, and declining kidney function, he has an expected life expectancy of <6 months, and more likely weeks.  I have had extensive discussion with his sister, and POA, Rodney Estrada on the phone.  We discussed the multiple problems that the patient is facing and his persistent encephalopathy.  We discussed the multiple risks of PEG tube.  After discussion, Rodney Estrada has decided to proceed with hospice care. - Discontinue all medications that do not immediately provide him comfort or quality of life - Discontinue NG tube and tube feeds - Allow for pleasure feeds, maintain aspiration precautions as able - TOC consulted -> [facility]    Severe sepsis secondary to UTI - On arrival: Leukocytosis, tachycardic, tachypneic, hypotensive - Fluid responsive, did not require pressors - Was transferred to the ICU initially but lactic acid within normal range and vitals improved to transfer to progressive instead - Status post vancomycin  which was discontinued - Status post ceftriaxone  for 1/2 - Invanz  per infectious disease 1/2 - 1/6, daptomycin  per ID 1/3-1/5   Acute metabolic encephalopathy secondary to uremia history of large CVA 2021, residual left-sided hemiparesis - Brain MRI: No acute intracranial abnormality.  Chronic right MCA territory encephalomalacia with ex vacuo dilation of the right lateral ventricle, extensive right hemispheric gliosis and wallerian degeneration of the right cerebral peduncle.  Multifocal T2 hyperintense signal within the cerebral white matter most likely secondary to chronic  small vessel disease - Keppra  levels elevated - Mentation continues to wax and wane - Patient has not been able to sustain oral nutrition.     AKI CKD stage IIIa Metabolic acidosis History of chronic left hydronephrosis History of kidney stones and  left ureteral stent placement and removal in February 2024 - Nephrology was consulted for hemodialysis - Patient started on bicarb infusion - Renal function slowly improving - Nephrology is recommending to avoid hemodialysis for now   Acute lower UTI - Status post IV Rocephin  - 1/1 repeated urine cultures negative - Antibiotic course as below - ID following   Staph epidermidis blood culture positive - Presumed contamination - Urine culture negative - ID consultation as above.  Antibiotic course as below   Elevated troponin - Thought to be secondary to demand ischemia - Status post IV heparin  which has since been discontinued - Cardiology was consulted and recommended no intervention.  Signed off on 12/31 - Echocardiogram: LVEF over 55%, left ventricular endocardial border not optimally defined to evaluate RWMA.  Moderate LVH   Gross hematuria - Resolved - May have been secondary to UTI with heparin  drip   Hyperkalemia Hypokalemia Hypophosphatemia - Resolved - Monitor BMP   Essential hypertension - Blood pressure is initially low secondary to sepsis - Gradually resume home meds as needed   CAD - At home on aspirin , Plavix , statin   Gout - Allopurinol  currently on hold secondary to AKI   Depression - Continue home dose Zoloft  and Abilify    GERD without esophagitis - Continue PPI   Seizure disorder - Continue Keppra    Constipation - Continue bowel regimen   Type 2 diabetes with peripheral neuropathy - Not on antihyperglycemics at home - Hemoglobin A1c: 5.6%   Steroid therapy - Patient was on 20 mg prednisone at home which was started on 12/26.  This is not a chronic medication.  Was discontinued earlier this admission   Acute on chronic normocytic anemia Anemia of chronic disease, iron deficiency anemia Folate deficiency - Baseline hemoglobin around 10, has been gradually downtrending throughout this admission likely secondary to renal disease.  Acute  hematuria resolved.  Monitor. - Anemia panel: Iron 21, TIBC 253, saturation is low    Discharge Instructions  Discharge Instructions     Call MD for:  difficulty breathing, headache or visual disturbances   Complete by: As directed    Call MD for:  persistant dizziness or light-headedness   Complete by: As directed    Call MD for:  persistant nausea and vomiting   Complete by: As directed    Call MD for:  severe uncontrolled pain   Complete by: As directed    Call MD for:  temperature >100.4   Complete by: As directed    Diet general   Complete by: As directed    DYSPHASIA DIET Fluid consistency: Nectar Thick   Comments: Extra Gravies on meats, potatoes. May have Oatmeal per Speech ok w/ butter, sugar. Yogurts, puddings.   Increase activity slowly   Complete by: As directed    No wound care   Complete by: As directed       Allergies as of 10/03/2024   No Known Allergies      Medication List     STOP taking these medications    allopurinol  100 MG tablet Commonly known as: ZYLOPRIM    amLODipine  10 MG tablet Commonly known as: NORVASC    aspirin  81 MG chewable tablet   atorvastatin  40 MG tablet Commonly known  as: LIPITOR    cefTRIAXone  1 g injection Commonly known as: ROCEPHIN    clopidogrel  75 MG tablet Commonly known as: PLAVIX    ipratropium-albuterol  0.5-2.5 (3) MG/3ML Soln Commonly known as: DUONEB   labetalol  100 MG tablet Commonly known as: NORMODYNE    lisinopril  40 MG tablet Commonly known as: ZESTRIL    oxybutynin  5 MG 24 hr tablet Commonly known as: DITROPAN -XL   pantoprazole  40 MG tablet Commonly known as: PROTONIX    polyethylene glycol 17 g packet Commonly known as: MIRALAX  / GLYCOLAX    predniSONE 20 MG tablet Commonly known as: DELTASONE   selenium sulfide 1 % Lotn Commonly known as: SELSUN   Vitamin D  (Ergocalciferol ) 1.25 MG (50000 UNIT) Caps capsule Commonly known as: DRISDOL        TAKE these medications    acetaminophen   500 MG tablet Commonly known as: TYLENOL  Take 1,000 mg by mouth 2 (two) times daily.   ARIPiprazole  2 MG tablet Commonly known as: ABILIFY  Take 2 mg by mouth at bedtime.   camphor-menthol  lotion Commonly known as: SARNA Apply 1 Application topically as needed for itching.   carboxymethylcellulose 0.5 % Soln Commonly known as: REFRESH PLUS Place 1 drop into both eyes 3 (three) times daily.   furosemide  20 MG tablet Commonly known as: LASIX  Take 20 mg by mouth daily.   gabapentin  300 MG capsule Commonly known as: NEURONTIN  Take 300 mg by mouth 3 (three) times daily.   guaiFENesin  600 MG 12 hr tablet Commonly known as: MUCINEX  Take 600 mg by mouth 2 (two) times daily.   lactulose  10 GM/15ML solution Commonly known as: CHRONULAC  Take 20 g by mouth 2 (two) times daily as needed for mild constipation.   levETIRAcetam  500 MG tablet Commonly known as: KEPPRA  Take 500 mg by mouth 2 (two) times daily.   senna-docusate 8.6-50 MG tablet Commonly known as: Senokot-S Take 2 tablets by mouth at bedtime.   sertraline  25 MG tablet Commonly known as: ZOLOFT  Place 7 tablets (175 mg total) into feeding tube daily. Start taking on: October 04, 2024 What changed:  medication strength how much to take how to take this Another medication with the same name was removed. Continue taking this medication, and follow the directions you see here.   Systane Balance 0.6 % Soln Generic drug: Propylene Glycol Apply 1 drop to eye at bedtime. Both eyes        Follow-up Information     Alluri, Keller BROCKS, MD. Go in 2 week(s).   Specialty: Cardiology Contact information: [address] [place] [address] [phone]                Allergies[1]  Consultations: Treatment Team:  Wilburn Keller BROCKS, MD Assaker, Darrin, MD Fayette Bodily, MD   Procedures/Studies: DG Abd 1 View Result Date: 10/02/2024 CLINICAL DATA:  Nasogastric tube placement. EXAM: DG  ABDOMEN 1V COMPARISON:  09/29/2024 FINDINGS: Nasogastric tube tip in the distal stomach and side hole in the mid to distal stomach. The included bowel-gas pattern is normal. Lumbar and lower thoracic spine degenerative changes. IMPRESSION: Nasogastric tube tip in the distal stomach. Electronically Signed   By: Elspeth Bathe M.D.   On: 10/02/2024 14:08   DG Abd 1 View Result Date: 09/29/2024 CLINICAL DATA:  NG tube placement EXAM: ABDOMEN - 1 VIEW COMPARISON:  09/26/2024 FINDINGS: Enteric tube tip and side port overlie the expected location of mid stomach. Pleural effusions and airspace disease at the bases. IMPRESSION: Enteric tube tip and side port overlie the expected location  of the mid stomach. Electronically Signed   By: Luke Bun M.D.   On: 09/29/2024 22:29   DG Abd 1 View Result Date: 09/26/2024 CLINICAL DATA:  Nasogastric tube. EXAM: ABDOMEN - 1 VIEW COMPARISON:  Radiograph yesterday FINDINGS: Tip and side port of the enteric tube below the diaphragm in the stomach. Slight decreased gaseous bowel distention in the upper abdomen. No evidence of free air. IMPRESSION: Tip and side port of the enteric tube below the diaphragm in the stomach. Electronically Signed   By: Andrea Gasman M.D.   On: 09/26/2024 16:20   DG Abd Portable 1V Result Date: 09/25/2024 CLINICAL DATA:  Nasogastric tube present. EXAM: PORTABLE ABDOMEN - 1 VIEW COMPARISON:  09/23/2024 FINDINGS: Tip of the enteric tube below the diaphragm in the stomach. The side port is just beyond the gastroesophageal junction. Prominent air-filled loops of bowel in the left abdomen. No evidence of free air. IMPRESSION: Tip of the enteric tube below the diaphragm in the stomach. The side port is just beyond the gastroesophageal junction. Electronically Signed   By: Andrea Gasman M.D.   On: 09/25/2024 17:34   US  RENAL Result Date: 09/25/2024 EXAM: US  Retroperitoneum Complete, Renal. 09/24/2024 11:41:07 AM TECHNIQUE: Real-time ultrasonography of  the retroperitoneum renal was performed. COMPARISON: US  Renal 11/28/2022 CLINICAL HISTORY: Acute kidney injury. FINDINGS: FINDINGS: RIGHT KIDNEY/URETER: Right kidney measures 12.3 x 5.2 x 6.3 cm. Right renal volume is 212 cc. Normal cortical echogenicity. A 7 mm nonobstructing calculus is seen within the interpolar region of the right kidney. No hydronephrosis. No mass. LEFT KIDNEY/URETER: Left kidney measures 12.7 x 5.3 x 5.2 cm. Volume of the left kidney is 1081 cc. Normal cortical echogenicity. Mild left perinephric fluid is present measuring up to 5 x 6 mm in thickness. There is no hydronephrosis. 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 No intrarenal masses. BLADDER: Unremarkable appearance of the bladder. IMPRESSION: 1. No hydronephrosis. 2. 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 3. Mild left perinephric fluid and a 7 mm nonobstructing right renal interpolar calculus. Electronically signed by: Dorethia Molt MD 09/25/2024 01:17 AM EST RP Workstation: [id number]   MR BRAIN WO CONTRAST Result Date: 09/24/2024 EXAM: MRI BRAIN WITHOUT CONTRAST 09/23/2024 12:41:34 PM TECHNIQUE: Multiplanar multisequence MRI of the head/brain was performed without the administration of intravenous contrast. COMPARISON: MR Head 04/06/2020. CLINICAL HISTORY: Neuro deficit, acute, stroke suspected. FINDINGS: BRAIN AND VENTRICLES: No acute infarct. No intracranial hemorrhage. No mass. No midline shift. No hydrocephalus. Advanced encephalomalacia is present throughout the right MCA territory with ex vacuo dilatation of the right lateral ventricle. There is extensive gliosis throughout the upper right hemisphere. Multifocal hyperintense T2-weighted signal is noted within the cerebral white matter, most commonly due to chronic small vessel disease. Wallerian degeneration of the right cerebral peduncle is  present. Chronic blood products are seen in the lateral right hemisphere. The sella is unremarkable. Normal flow voids. ORBITS: No acute abnormality. SINUSES AND MASTOIDS: No acute abnormality. BONES AND SOFT TISSUES: Normal marrow signal. No acute soft tissue abnormality. IMPRESSION: 1. No acute intracranial abnormality. 2. Chronic right MCA territory encephalomalacia with ex vacuo dilatation of the right lateral ventricle, extensive right hemispheric gliosis, and Wallerian degeneration of the right cerebral peduncle. 3. Multifocal T2 hyperintense signal within the cerebral white matter, most commonly due to chronic small vessel disease. Electronically signed by: Franky Stanford  MD 09/24/2024 03:46 AM EST RP Workstation: [id number]   ECHOCARDIOGRAM COMPLETE Result Date: 09/23/2024    ECHOCARDIOGRAM REPORT   Patient Name:   Rodney Estrada Date of Exam: 09/23/2024 Medical Rec #:  [id number]    Height:       69.0 in Accession #:    [id number]   Weight:       169.0 lb Date of Birth:  [birth year]     BSA:          1.923 m Patient Age:    73 years     BP:           88/53 mmHg Patient Gender: M            HR:           60 bpm. Exam Location:  [facility] Procedure: 2D Echo, Cardiac Doppler and Color Doppler (Both Spectral and Color            Flow Doppler were utilized during procedure). Indications:     Elevated troponin  History:         Patient has prior history of Echocardiogram examinations, most                  recent 04/06/2020. Risk Factors:Hypertension. Cocaine use.  Sonographer:     Christopher Furnace Referring Phys:  [id number] JAN A MANSY Diagnosing Phys: Lonni End MD  Sonographer Comments: Technically difficult study due to poor echo windows and no apical window. Image acquisition challenging due to respiratory motion and All views obtainable were from modified views. IMPRESSIONS  1. Left ventricular ejection fraction, by estimation, is >55%. The left ventricle has normal function. Left ventricular endocardial border not  optimally defined to evaluate regional wall motion. There is moderate left ventricular hypertrophy. Left ventricular diastolic function could not be evaluated.  2. Right ventricular systolic function is normal. The right ventricular size is normal. Mildly increased right ventricular wall thickness.  3. The mitral valve was not well visualized. No evidence of mitral valve regurgitation.  4. Tricuspid valve regurgitation could not be accurately assessed.  5. The aortic valve has an indeterminant number of cusps. There is mild calcification of the aortic valve. There is mild thickening of the aortic valve. Aortic valve regurgitation is not visualized. Aortic valve gradient could not be assessed.  6. Pulmonic valve regurgitation could not be accurately assessed.  7. The inferior vena cava is normal in size with greater than 50% respiratory variability, suggesting right atrial pressure of 3 mmHg. FINDINGS  Left Ventricle: Left ventricular ejection fraction, by estimation, is >55%. The left ventricle has normal function. Left ventricular endocardial border not optimally defined to evaluate regional wall motion. The left ventricular internal cavity size was  normal in size. There is moderate left ventricular hypertrophy. Left ventricular diastolic function could not be evaluated. Right Ventricle: The right ventricular size is normal. Mildly increased right ventricular wall thickness. Right ventricular systolic function is normal. Left Atrium: Left atrial size was not well visualized. Right Atrium: Right atrial size was not well visualized. Pericardium: There is no evidence of pericardial effusion. Mitral Valve: The mitral valve was not well visualized. Mild mitral annular calcification. No evidence of mitral valve regurgitation. Tricuspid Valve: The tricuspid valve is not well visualized. Tricuspid valve regurgitation could not be accurately assessed. Aortic Valve: The aortic valve has an indeterminant number of cusps.  There is mild calcification of the aortic valve. There is mild thickening of the aortic valve. Aortic valve regurgitation is  not visualized. Aortic valve gradient could not be assessed. Pulmonic Valve: The pulmonic valve was not well visualized. Pulmonic valve regurgitation could not be accurately assessed. Aorta: The aortic root is normal in size and structure. Pulmonary Artery: The pulmonary artery is not well seen. Venous: The inferior vena cava is normal in size with greater than 50% respiratory variability, suggesting right atrial pressure of 3 mmHg. IAS/Shunts: No atrial level shunt detected by color flow Doppler.  LEFT VENTRICLE PLAX 2D LVIDd:         4.50 cm LVIDs:         3.30 cm LV PW:         1.30 cm LV IVS:        1.40 cm LVOT diam:     2.00 cm LVOT Area:     3.14 cm  LEFT ATRIUM         Index LA diam:    4.20 cm 2.18 cm/m   AORTA Ao Root diam: 3.30 cm  SHUNTS Systemic Diam: 2.00 cm Lonni Hanson MD Electronically signed by Lonni Hanson MD Signature Date/Time: 09/23/2024/7:12:34 PM    Final    DG Abd 1 View Result Date: 09/23/2024 CLINICAL DATA:  Urinary tract infection.  Altered mental status. EXAM: ABDOMEN - 1 VIEW COMPARISON:  Radiograph 08/01/2022 FINDINGS: Possible right femoral catheter with tip terminating in the region of the right iliac vessels, alternatively this may be external to the patient. Slight gaseous distention of colon and to a lesser extent small bowel in the left abdomen. Vascular calcifications as well as calcifications projecting over the left renal shadow, renal stones versus renal vascular calcifications. No evidence of free air on supine views. IMPRESSION: 1. Possible right femoral catheter with tip terminating in the region of the right iliac vessels, alternatively this may be external to the patient. 2. Slight gaseous distention of colon and to a lesser extent small bowel in the left abdomen, may represent ileus. 3. Possible left-sided urolithiasis. Electronically  Signed   By: Andrea Gasman M.D.   On: 09/23/2024 16:02   CT Head Wo Contrast Result Date: 09/22/2024 EXAM: CT HEAD WITHOUT CONTRAST 09/22/2024 06:47:02 PM TECHNIQUE: CT of the head was performed without the administration of intravenous contrast. Automated exposure control, iterative reconstruction, and/or weight based adjustment of the mA/kV was utilized to reduce the radiation dose to as low as reasonably achievable. COMPARISON: None available. CLINICAL HISTORY: Mental status change, unknown cause FINDINGS: BRAIN AND VENTRICLES: No acute hemorrhage. Large remote right MCA territory infarct. Vascular stent within the right MCA M2 segment. Mild periventricular white matter changes likely related to small vessel ischemia. Mild superimposed global parenchymal atrophy. Ex vacuo dilatation of right lateral ventricle. No extra-axial collection. No mass effect or midline shift. Calcific atherosclerosis. ORBITS: No acute abnormality. SINUSES: Right maxillary sinus mucosal thickening. SOFT TISSUES AND SKULL: No acute soft tissue abnormality. No skull fracture. IMPRESSION: 1. Large remote right MCA territory infarct with ex vacuo dilatation of the right lateral ventricle and a vascular stent in the right MCA M2 segment. 2. Mild chronic microvascular ischemic white matter changes. 3. Mild global cerebral parenchymal atrophy. 4. Calcific atherosclerosis. 5. Right maxillary sinus mucosal thickening. Electronically signed by: Dorethia Molt MD 09/22/2024 08:07 PM EST RP Workstation: [id number]   DG Chest Port 1 View Result Date: 09/22/2024 EXAM: 1 VIEW(S) XRAY OF THE CHEST 09/22/2024 05:45:00 PM COMPARISON: None available. CLINICAL HISTORY: Hypoxia FINDINGS: LUNGS AND PLEURA: Low lung volumes. No focal pulmonary opacity. No pleural effusion. No  pneumothorax. HEART AND MEDIASTINUM: Aortic calcification. No acute abnormality of the cardiac and mediastinal silhouettes. BONES AND SOFT TISSUES: No acute osseous abnormality.  UPPER ABDOMEN: Gastric gaseous distention. IMPRESSION: 1. No acute cardiopulmonary abnormality. 2. Low lung volumes. 3. Aortic calcification. Electronically signed by: Dorethia Molt MD 09/22/2024 08:05 PM EST RP Workstation: [id number]      Discharge Exam: Vitals:   10/02/24 2022 10/03/24 0728  BP: (!) 158/73 (!) 138/54  Pulse: 61 (!) 57  Resp: 16 18  Temp: 98.5 F (36.9 C) (!) 97.3 F (36.3 C)  SpO2: 96% 97%   Vitals:   10/02/24 1526 10/02/24 1800 10/02/24 2022 10/03/24 0728  BP: (!) 148/95 (!) 159/73 (!) 158/73 (!) 138/54  Pulse: 66 61 61 (!) 57  Resp: 18 18 16 18   Temp: 98.5 F (36.9 C) (!) 97.5 F (36.4 C) 98.5 F (36.9 C) (!) 97.3 F (36.3 C)  TempSrc:      SpO2: 93% 95% 96% 97%  Weight:      Height:        General exam: Appears calm and comfortable, NAD  Respiratory system: No work of breathing, symmetric chest wall expansion Cardiovascular system: S1 & S2 heard, RRR.  Gastrointestinal system: Abdomen is nondistended, soft and nontender Neuro: Awakens briefly.  Tracks, does not follow commands.  The results of significant diagnostics from this hospitalization (including imaging, microbiology, ancillary and laboratory) are listed below for reference.     Microbiology: Recent Results (from the past 240 hours)  Culture, blood (Routine X 2) w Reflex to ID Panel     Status: None   Collection Time: 09/24/24  6:18 PM   Specimen: BLOOD  Result Value Ref Range Status   Specimen Description BLOOD BLOOD RIGHT HAND  Final   Special Requests   Final    BOTTLES DRAWN AEROBIC AND ANAEROBIC Blood Culture adequate volume   Culture   Final    NO GROWTH 5 DAYS Performed at [facility], [address], [place], [place] [address]    Report Status 09/29/2024 FINAL  Final  MRSA Next Gen by PCR, Nasal     Status: Abnormal   Collection Time: 09/24/24  6:18 PM   Specimen: Nasal Mucosa; Nasal Swab  Result Value Ref Range Status   MRSA by PCR Next Gen DETECTED (A) NOT  DETECTED Final    Comment: RESULT CALLED TO, READ BACK BY AND VERIFIED WITH:  GEORGINA OROZCO AT 2345 09/24/24 JG (NOTE) The GeneXpert MRSA Assay (FDA approved for NASAL specimens only), is one component of a comprehensive MRSA colonization surveillance program. It is not intended to diagnose MRSA infection nor to guide or monitor treatment for MRSA infections. Test performance is not FDA approved in patients less than 73 years old. Performed at [facility], [address] [place], [place] [address]   Culture, blood (Routine X 2) w Reflex to ID Panel     Status: None   Collection Time: 09/24/24  9:16 PM   Specimen: BLOOD  Result Value Ref Range Status   Specimen Description BLOOD BLOOD RIGHT HAND  Final   Special Requests   Final    BOTTLES DRAWN AEROBIC AND ANAEROBIC Blood Culture results may not be optimal due to an inadequate volume of blood received in culture bottles   Culture   Final    NO GROWTH 5 DAYS Performed at [facility], [address] [place], [place] [address]    Report Status 09/29/2024 FINAL  Final  Urine  Culture (for pregnant, neutropenic or urologic patients or patients with an indwelling urinary catheter)     Status: None   Collection Time: 09/25/24 10:03 AM   Specimen: Urine, Clean Catch  Result Value Ref Range Status   Specimen Description   Final    URINE, CLEAN CATCH Performed at [facility], [address], [place], [place] [address]    Special Requests   Final    NONE Performed at [facility], [address] [place], [place] [address]    Culture   Final    NO GROWTH Performed at [facility], [address] [place], [place] [address]    Report Status 09/26/2024 FINAL  Final  Gastrointestinal Panel by PCR , Stool     Status: None   Collection Time: 09/28/24  1:33 PM   Specimen: Stool  Result Value Ref Range Status   Campylobacter species NOT DETECTED NOT DETECTED Final   Plesimonas  shigelloides NOT DETECTED NOT DETECTED Final   Salmonella species NOT DETECTED NOT DETECTED Final   Yersinia enterocolitica NOT DETECTED NOT DETECTED Final   Vibrio species NOT DETECTED NOT DETECTED Final   Vibrio cholerae NOT DETECTED NOT DETECTED Final   Enteroaggregative E coli (EAEC) NOT DETECTED NOT DETECTED Final   Enteropathogenic E coli (EPEC) NOT DETECTED NOT DETECTED Final   Enterotoxigenic E coli (ETEC) NOT DETECTED NOT DETECTED Final   Shiga like toxin producing E coli (STEC) NOT DETECTED NOT DETECTED Final   Shigella/Enteroinvasive E coli (EIEC) NOT DETECTED NOT DETECTED Final   Cryptosporidium NOT DETECTED NOT DETECTED Final   Cyclospora cayetanensis NOT DETECTED NOT DETECTED Final   Entamoeba histolytica NOT DETECTED NOT DETECTED Final   Giardia lamblia NOT DETECTED NOT DETECTED Final   Adenovirus F40/41 NOT DETECTED NOT DETECTED Final   Astrovirus NOT DETECTED NOT DETECTED Final   Norovirus GI/GII NOT DETECTED NOT DETECTED Final   Rotavirus A NOT DETECTED NOT DETECTED Final   Sapovirus (I, II, IV, and V) NOT DETECTED NOT DETECTED Final    Comment: Performed at [facility], [address] [place], [place] [address]     Labs: BNP (last 3 results) No results for input(s): BNP in the last 8760 hours. Basic Metabolic Panel: Recent Labs  Lab 09/27/24 0423 09/27/24 1954 09/28/24 0421 09/28/24 1558 09/29/24 0431 09/30/24 0352 10/01/24 0422  NA 146*   < > 152* 150* 148* 146* 145  K 3.1*   < > 3.2* 3.9 4.0 4.4 5.3*  CL 105   < > 109 110 110 109 107  CO2 27   < > 28 27 27 26 25   GLUCOSE 188*   < > 173* 160* 169* 140* 130*  BUN 84*   < > 74* 70* 63* 60* 61*  CREATININE 4.16*   < > 3.50* 3.22* 3.16* 3.26* 3.39*  CALCIUM  8.1*   < > 7.9* 7.8* 8.3* 7.8* 8.1*  MG 2.2  --  2.0  --   --  1.8 1.9  PHOS 3.5  --  2.7  --  1.3* 2.5 4.2   < > = values in this interval not displayed.   Liver Function Tests: No results for input(s): AST, ALT, ALKPHOS,  BILITOT, PROT, ALBUMIN in the last 168 hours. No results for input(s): LIPASE, AMYLASE in the last 168 hours. No results for input(s): AMMONIA in the last 168 hours. CBC: Recent Labs  Lab 09/27/24 0423 09/28/24 0421 09/29/24 0431 10/01/24 0422  WBC 5.6 6.5 8.1  9.1  HGB 9.7* 9.7* 9.8* 8.9*  HCT 29.7* 30.0* 30.5* 28.2*  MCV 91.4 91.7 92.7 95.6  PLT 120* 125* 142* 223   Cardiac Enzymes: Recent Labs  Lab 09/27/24 0423 09/28/24 0421  CKTOTAL 261 100   BNP: Invalid input(s): POCBNP CBG: Recent Labs  Lab 10/02/24 0749 10/02/24 1144 10/02/24 1528 10/02/24 2146 10/03/24 0726  GLUCAP 133* 95 145* 104* 92   D-Dimer No results for input(s): DDIMER in the last 72 hours. Hgb A1c No results for input(s): HGBA1C in the last 72 hours. Lipid Profile No results for input(s): CHOL, HDL, LDLCALC, TRIG, CHOLHDL, LDLDIRECT in the last 72 hours. Thyroid function studies No results for input(s): TSH, T4TOTAL, T3FREE, THYROIDAB in the last 72 hours.  Invalid input(s): FREET3 Anemia work up No results for input(s): VITAMINB12, FOLATE, FERRITIN, TIBC, IRON, RETICCTPCT in the last 72 hours. Urinalysis    Component Value Date/Time   COLORURINE YELLOW (A) 09/25/2024 1003   APPEARANCEUR HAZY (A) 09/25/2024 1003   APPEARANCEUR Hazy (A) 09/29/2022 1134   LABSPEC 1.012 09/25/2024 1003   PHURINE 5.0 09/25/2024 1003   GLUCOSEU NEGATIVE 09/25/2024 1003   HGBUR LARGE (A) 09/25/2024 1003   BILIRUBINUR NEGATIVE 09/25/2024 1003   BILIRUBINUR Negative 09/29/2022 1134   KETONESUR 5 (A) 09/25/2024 1003   PROTEINUR 30 (A) 09/25/2024 1003   NITRITE NEGATIVE 09/25/2024 1003   LEUKOCYTESUR LARGE (A) 09/25/2024 1003   Sepsis Labs Recent Labs  Lab 09/27/24 0423 09/28/24 0421 09/29/24 0431 10/01/24 0422  WBC 5.6 6.5 8.1 9.1   Microbiology Recent Results (from the past 240 hours)  Culture, blood (Routine X 2) w Reflex to ID Panel     Status:  None   Collection Time: 09/24/24  6:18 PM   Specimen: BLOOD  Result Value Ref Range Status   Specimen Description BLOOD BLOOD RIGHT HAND  Final   Special Requests   Final    BOTTLES DRAWN AEROBIC AND ANAEROBIC Blood Culture adequate volume   Culture   Final    NO GROWTH 5 DAYS Performed at [facility], [address] [place], [place] [address]    Report Status 09/29/2024 FINAL  Final  MRSA Next Gen by PCR, Nasal     Status: Abnormal   Collection Time: 09/24/24  6:18 PM   Specimen: Nasal Mucosa; Nasal Swab  Result Value Ref Range Status   MRSA by PCR Next Gen DETECTED (A) NOT DETECTED Final    Comment: RESULT CALLED TO, READ BACK BY AND VERIFIED WITH:  GEORGINA OROZCO AT 2345 09/24/24 JG (NOTE) The GeneXpert MRSA Assay (FDA approved for NASAL specimens only), is one component of a comprehensive MRSA colonization surveillance program. It is not intended to diagnose MRSA infection nor to guide or monitor treatment for MRSA infections. Test performance is not FDA approved in patients less than 46 years old. Performed at [facility], [address] [place], [place] [address]   Culture, blood (Routine X 2) w Reflex to ID Panel     Status: None   Collection Time: 09/24/24  9:16 PM   Specimen: BLOOD  Result Value Ref Range Status   Specimen Description BLOOD BLOOD RIGHT HAND  Final   Special Requests   Final    BOTTLES DRAWN AEROBIC AND ANAEROBIC Blood Culture results may not be optimal due to an inadequate volume of blood received in culture bottles   Culture   Final    NO GROWTH 5 DAYS Performed at [facility], [address], [place], [place] [address]    Report Status 09/29/2024 FINAL  Final  Urine Culture (for pregnant, neutropenic or urologic patients or patients with an indwelling urinary catheter)     Status: None   Collection Time: 09/25/24 10:03 AM   Specimen: Urine, Clean Catch  Result Value Ref Range Status   Specimen Description    Final    URINE, CLEAN CATCH Performed at [facility], [address] [place], [place] [address]    Special Requests   Final    NONE Performed at [facility], [address] [place], [place] [address]    Culture   Final    NO GROWTH Performed at [facility], [address] [place], [place] [address]    Report Status 09/26/2024 FINAL  Final  Gastrointestinal Panel by PCR , Stool     Status: None   Collection Time: 09/28/24  1:33 PM   Specimen: Stool  Result Value Ref Range Status   Campylobacter species NOT DETECTED NOT DETECTED Final   Plesimonas shigelloides NOT DETECTED NOT DETECTED Final   Salmonella species NOT DETECTED NOT DETECTED Final   Yersinia enterocolitica NOT DETECTED NOT DETECTED Final   Vibrio species NOT DETECTED NOT DETECTED Final   Vibrio cholerae NOT DETECTED NOT DETECTED Final   Enteroaggregative E coli (EAEC) NOT DETECTED NOT DETECTED Final   Enteropathogenic E coli (EPEC) NOT DETECTED NOT DETECTED Final   Enterotoxigenic E coli (ETEC) NOT DETECTED NOT DETECTED Final   Shiga like toxin producing E coli (STEC) NOT DETECTED NOT DETECTED Final   Shigella/Enteroinvasive E coli (EIEC) NOT DETECTED NOT DETECTED Final   Cryptosporidium NOT DETECTED NOT DETECTED Final   Cyclospora cayetanensis NOT DETECTED NOT DETECTED Final   Entamoeba histolytica NOT DETECTED NOT DETECTED Final   Giardia lamblia NOT DETECTED NOT DETECTED Final   Adenovirus F40/41 NOT DETECTED NOT DETECTED Final   Astrovirus NOT DETECTED NOT DETECTED Final   Norovirus GI/GII NOT DETECTED NOT DETECTED Final   Rotavirus A NOT DETECTED NOT DETECTED Final   Sapovirus (I, II, IV, and V) NOT DETECTED NOT DETECTED Final    Comment: Performed at [facility], [address] [place], [place] [address]     Time coordinating discharge: 32 min   SIGNED: Lorane Poland, DO [facility] 10/03/2024, 12:31 PM Pager   If 7PM-7AM, please contact  night-coverage     [1] No Known Allergies  "

## 2024-10-03 NOTE — Progress Notes (Signed)
 Rounds completed with [place], BMT. Patient resting comfortably. Alert, but does not speak during interaction. Repositioned.

## 2024-10-03 NOTE — Progress Notes (Signed)
 SLP Cancellation Note  [name] MRN: [id number] DOB: [birth year]   Cancelled treatment:       Reason Eval/Treat Not Completed:  (chart reviewed)  Per MD notes, Becky(sister) has chosen pt to discharge to [facility] w/ [facility] as his POC, and he has been made comfort care w/ pleasure feeds. Diet continues as a dysphagia level 1 w/ nectar liquids. Recommend continued aspiration precautions; feeding support at meals(to offer po's). ST services will sign off at this time w/ MD to reconsult if need indicates.      Comer Portugal, MS, CCC-SLP Speech Language Pathologist Rehab Services; [facility] [phone] (ascom) Hevin Jeffcoat 10/03/2024, 8:50 AM

## 2024-10-26 DEATH — deceased
